# Patient Record
Sex: Female | Born: 1935 | Race: White | Hispanic: No | Marital: Married | Smoking: Never smoker
Health system: Southern US, Community
[De-identification: ages and names within clinical notes are randomized; demographics above are authoritative.]

## PROBLEM LIST (undated history)

## (undated) DIAGNOSIS — F419 Anxiety disorder, unspecified: Secondary | ICD-10-CM

## (undated) DIAGNOSIS — N189 Chronic kidney disease, unspecified: Secondary | ICD-10-CM

## (undated) DIAGNOSIS — T386X5A Adverse effect of antigonadotrophins, antiestrogens, antiandrogens, not elsewhere classified, initial encounter: Secondary | ICD-10-CM

## (undated) DIAGNOSIS — G894 Chronic pain syndrome: Secondary | ICD-10-CM

## (undated) DIAGNOSIS — E039 Hypothyroidism, unspecified: Secondary | ICD-10-CM

## (undated) DIAGNOSIS — I10 Essential (primary) hypertension: Secondary | ICD-10-CM

## (undated) DIAGNOSIS — E7800 Pure hypercholesterolemia, unspecified: Secondary | ICD-10-CM

## (undated) DIAGNOSIS — J101 Influenza due to other identified influenza virus with other respiratory manifestations: Secondary | ICD-10-CM

## (undated) DIAGNOSIS — I4891 Unspecified atrial fibrillation: Secondary | ICD-10-CM

## (undated) DIAGNOSIS — I2699 Other pulmonary embolism without acute cor pulmonale: Secondary | ICD-10-CM

## (undated) DIAGNOSIS — M818 Other osteoporosis without current pathological fracture: Secondary | ICD-10-CM

## (undated) DIAGNOSIS — M1990 Unspecified osteoarthritis, unspecified site: Secondary | ICD-10-CM

## (undated) DIAGNOSIS — IMO0002 Reserved for concepts with insufficient information to code with codable children: Secondary | ICD-10-CM

## (undated) DIAGNOSIS — I2510 Atherosclerotic heart disease of native coronary artery without angina pectoris: Secondary | ICD-10-CM

## (undated) DIAGNOSIS — I499 Cardiac arrhythmia, unspecified: Secondary | ICD-10-CM

## (undated) DIAGNOSIS — J449 Chronic obstructive pulmonary disease, unspecified: Secondary | ICD-10-CM

## (undated) HISTORY — PX: PACEMAKER INSERTION: SHX728

## (undated) HISTORY — DX: Cardiac arrhythmia, unspecified: I49.9

## (undated) HISTORY — PX: APPENDECTOMY: SHX54

## (undated) HISTORY — PX: TOTAL HIP ARTHROPLASTY: SHX124

## (undated) HISTORY — PX: ABDOMINAL HYSTERECTOMY: SHX81

---

## 1999-11-07 ENCOUNTER — Encounter: Admission: RE | Admit: 1999-11-07 | Discharge: 1999-11-07 | Payer: Self-pay | Admitting: *Deleted

## 1999-11-07 ENCOUNTER — Encounter: Payer: Self-pay | Admitting: *Deleted

## 2000-11-08 ENCOUNTER — Encounter: Admission: RE | Admit: 2000-11-08 | Discharge: 2000-11-08 | Payer: Self-pay | Admitting: *Deleted

## 2000-11-08 ENCOUNTER — Encounter: Payer: Self-pay | Admitting: *Deleted

## 2001-06-24 ENCOUNTER — Emergency Department (HOSPITAL_COMMUNITY): Admission: EM | Admit: 2001-06-24 | Discharge: 2001-06-24 | Payer: Self-pay | Admitting: Emergency Medicine

## 2001-07-15 ENCOUNTER — Ambulatory Visit (HOSPITAL_COMMUNITY): Admission: RE | Admit: 2001-07-15 | Discharge: 2001-07-16 | Payer: Self-pay | Admitting: Family Medicine

## 2001-07-16 ENCOUNTER — Encounter: Payer: Self-pay | Admitting: Family Medicine

## 2001-11-03 ENCOUNTER — Other Ambulatory Visit: Admission: RE | Admit: 2001-11-03 | Discharge: 2001-11-03 | Payer: Self-pay | Admitting: Dermatology

## 2001-11-18 ENCOUNTER — Encounter: Admission: RE | Admit: 2001-11-18 | Discharge: 2001-11-18 | Payer: Self-pay | Admitting: *Deleted

## 2001-11-18 ENCOUNTER — Encounter: Payer: Self-pay | Admitting: *Deleted

## 2002-02-02 ENCOUNTER — Inpatient Hospital Stay (HOSPITAL_COMMUNITY): Admission: EM | Admit: 2002-02-02 | Discharge: 2002-02-08 | Payer: Self-pay | Admitting: Emergency Medicine

## 2002-02-04 ENCOUNTER — Encounter (INDEPENDENT_AMBULATORY_CARE_PROVIDER_SITE_OTHER): Payer: Self-pay | Admitting: Cardiology

## 2002-03-18 ENCOUNTER — Encounter: Payer: Self-pay | Admitting: Emergency Medicine

## 2002-03-18 ENCOUNTER — Inpatient Hospital Stay (HOSPITAL_COMMUNITY): Admission: EM | Admit: 2002-03-18 | Discharge: 2002-03-21 | Payer: Self-pay | Admitting: Emergency Medicine

## 2002-05-07 ENCOUNTER — Ambulatory Visit (HOSPITAL_COMMUNITY): Admission: RE | Admit: 2002-05-07 | Discharge: 2002-05-07 | Payer: Self-pay | Admitting: Internal Medicine

## 2002-05-07 ENCOUNTER — Encounter (INDEPENDENT_AMBULATORY_CARE_PROVIDER_SITE_OTHER): Payer: Self-pay | Admitting: Internal Medicine

## 2002-11-20 ENCOUNTER — Encounter: Payer: Self-pay | Admitting: *Deleted

## 2002-11-20 ENCOUNTER — Ambulatory Visit (HOSPITAL_COMMUNITY): Admission: RE | Admit: 2002-11-20 | Discharge: 2002-11-20 | Payer: Self-pay | Admitting: *Deleted

## 2003-10-18 ENCOUNTER — Ambulatory Visit (HOSPITAL_COMMUNITY): Admission: RE | Admit: 2003-10-18 | Discharge: 2003-10-18 | Payer: Self-pay | Admitting: Urology

## 2003-11-23 ENCOUNTER — Ambulatory Visit (HOSPITAL_COMMUNITY): Admission: RE | Admit: 2003-11-23 | Discharge: 2003-11-23 | Payer: Self-pay | Admitting: *Deleted

## 2003-12-08 ENCOUNTER — Ambulatory Visit (HOSPITAL_COMMUNITY): Admission: RE | Admit: 2003-12-08 | Discharge: 2003-12-08 | Payer: Self-pay | Admitting: *Deleted

## 2004-04-26 ENCOUNTER — Inpatient Hospital Stay (HOSPITAL_COMMUNITY): Admission: EM | Admit: 2004-04-26 | Discharge: 2004-05-02 | Payer: Self-pay | Admitting: Orthopedic Surgery

## 2004-04-26 ENCOUNTER — Encounter: Payer: Self-pay | Admitting: Emergency Medicine

## 2004-05-02 ENCOUNTER — Inpatient Hospital Stay (HOSPITAL_COMMUNITY)
Admission: RE | Admit: 2004-05-02 | Discharge: 2004-05-09 | Payer: Self-pay | Admitting: Physical Medicine & Rehabilitation

## 2004-05-02 ENCOUNTER — Ambulatory Visit: Payer: Self-pay | Admitting: Physical Medicine & Rehabilitation

## 2004-10-25 ENCOUNTER — Ambulatory Visit (HOSPITAL_COMMUNITY): Admission: RE | Admit: 2004-10-25 | Discharge: 2004-10-25 | Payer: Self-pay | Admitting: Family Medicine

## 2004-11-16 ENCOUNTER — Inpatient Hospital Stay (HOSPITAL_COMMUNITY): Admission: RE | Admit: 2004-11-16 | Discharge: 2004-11-18 | Payer: Self-pay | Admitting: *Deleted

## 2005-02-02 ENCOUNTER — Ambulatory Visit (HOSPITAL_COMMUNITY): Admission: RE | Admit: 2005-02-02 | Discharge: 2005-02-02 | Payer: Self-pay | Admitting: Family Medicine

## 2005-02-13 ENCOUNTER — Ambulatory Visit (HOSPITAL_COMMUNITY): Admission: RE | Admit: 2005-02-13 | Discharge: 2005-02-13 | Payer: Self-pay | Admitting: Orthopedic Surgery

## 2005-02-28 ENCOUNTER — Encounter: Admission: RE | Admit: 2005-02-28 | Discharge: 2005-02-28 | Payer: Self-pay | Admitting: Orthopedic Surgery

## 2005-03-01 ENCOUNTER — Encounter: Admission: RE | Admit: 2005-03-01 | Discharge: 2005-03-01 | Payer: Self-pay | Admitting: Orthopedic Surgery

## 2005-03-24 ENCOUNTER — Emergency Department (HOSPITAL_COMMUNITY): Admission: EM | Admit: 2005-03-24 | Discharge: 2005-03-24 | Payer: Self-pay | Admitting: Emergency Medicine

## 2005-04-08 ENCOUNTER — Inpatient Hospital Stay (HOSPITAL_COMMUNITY): Admission: EM | Admit: 2005-04-08 | Discharge: 2005-04-14 | Payer: Self-pay | Admitting: *Deleted

## 2005-04-09 ENCOUNTER — Ambulatory Visit: Payer: Self-pay | Admitting: Orthopedic Surgery

## 2005-04-17 ENCOUNTER — Encounter (HOSPITAL_COMMUNITY): Admission: RE | Admit: 2005-04-17 | Discharge: 2005-05-17 | Payer: Self-pay | Admitting: Internal Medicine

## 2005-05-03 ENCOUNTER — Ambulatory Visit (HOSPITAL_COMMUNITY): Admission: RE | Admit: 2005-05-03 | Discharge: 2005-05-03 | Payer: Self-pay | Admitting: Internal Medicine

## 2005-05-14 ENCOUNTER — Ambulatory Visit (HOSPITAL_COMMUNITY): Admission: RE | Admit: 2005-05-14 | Discharge: 2005-05-14 | Payer: Self-pay | Admitting: Ophthalmology

## 2005-05-28 ENCOUNTER — Observation Stay (HOSPITAL_COMMUNITY): Admission: AD | Admit: 2005-05-28 | Discharge: 2005-05-31 | Payer: Self-pay | Admitting: Family Medicine

## 2005-05-29 ENCOUNTER — Ambulatory Visit: Payer: Self-pay | Admitting: Internal Medicine

## 2006-03-28 ENCOUNTER — Ambulatory Visit (HOSPITAL_COMMUNITY): Admission: RE | Admit: 2006-03-28 | Discharge: 2006-03-28 | Payer: Self-pay | Admitting: Family Medicine

## 2006-04-24 ENCOUNTER — Ambulatory Visit (HOSPITAL_COMMUNITY): Admission: RE | Admit: 2006-04-24 | Discharge: 2006-04-24 | Payer: Self-pay | Admitting: Family Medicine

## 2006-06-11 ENCOUNTER — Ambulatory Visit (HOSPITAL_COMMUNITY): Admission: RE | Admit: 2006-06-11 | Discharge: 2006-06-11 | Payer: Self-pay | Admitting: Family Medicine

## 2006-06-13 ENCOUNTER — Emergency Department (HOSPITAL_COMMUNITY): Admission: EM | Admit: 2006-06-13 | Discharge: 2006-06-13 | Payer: Self-pay | Admitting: Emergency Medicine

## 2006-06-27 ENCOUNTER — Ambulatory Visit: Payer: Self-pay | Admitting: Orthopedic Surgery

## 2006-07-01 ENCOUNTER — Ambulatory Visit (HOSPITAL_COMMUNITY): Admission: RE | Admit: 2006-07-01 | Discharge: 2006-07-01 | Payer: Self-pay | Admitting: Orthopedic Surgery

## 2006-07-03 ENCOUNTER — Ambulatory Visit: Payer: Self-pay | Admitting: Orthopedic Surgery

## 2006-07-09 ENCOUNTER — Encounter (HOSPITAL_COMMUNITY): Admission: RE | Admit: 2006-07-09 | Discharge: 2006-08-08 | Payer: Self-pay | Admitting: Orthopedic Surgery

## 2006-08-07 ENCOUNTER — Ambulatory Visit: Payer: Self-pay | Admitting: Orthopedic Surgery

## 2006-08-12 ENCOUNTER — Encounter (HOSPITAL_COMMUNITY): Admission: RE | Admit: 2006-08-12 | Discharge: 2006-09-02 | Payer: Self-pay | Admitting: Orthopedic Surgery

## 2006-09-18 ENCOUNTER — Ambulatory Visit: Payer: Self-pay | Admitting: Orthopedic Surgery

## 2006-09-24 ENCOUNTER — Ambulatory Visit (HOSPITAL_COMMUNITY): Admission: RE | Admit: 2006-09-24 | Discharge: 2006-09-24 | Payer: Self-pay | Admitting: Cardiology

## 2006-10-23 ENCOUNTER — Ambulatory Visit (HOSPITAL_COMMUNITY): Admission: RE | Admit: 2006-10-23 | Discharge: 2006-10-23 | Payer: Self-pay | Admitting: Cardiology

## 2007-04-28 ENCOUNTER — Ambulatory Visit (HOSPITAL_COMMUNITY): Admission: RE | Admit: 2007-04-28 | Discharge: 2007-04-28 | Payer: Self-pay | Admitting: Family Medicine

## 2007-08-07 ENCOUNTER — Ambulatory Visit: Payer: Self-pay | Admitting: Orthopedic Surgery

## 2007-08-07 DIAGNOSIS — M5430 Sciatica, unspecified side: Secondary | ICD-10-CM | POA: Insufficient documentation

## 2008-02-12 ENCOUNTER — Telehealth: Payer: Self-pay | Admitting: Orthopedic Surgery

## 2008-03-17 ENCOUNTER — Ambulatory Visit: Payer: Self-pay | Admitting: Orthopedic Surgery

## 2008-03-17 DIAGNOSIS — M47817 Spondylosis without myelopathy or radiculopathy, lumbosacral region: Secondary | ICD-10-CM

## 2008-05-27 ENCOUNTER — Ambulatory Visit (HOSPITAL_COMMUNITY): Admission: RE | Admit: 2008-05-27 | Discharge: 2008-05-27 | Payer: Self-pay | Admitting: Family Medicine

## 2008-06-21 ENCOUNTER — Ambulatory Visit: Payer: Self-pay | Admitting: Orthopedic Surgery

## 2008-06-24 ENCOUNTER — Telehealth: Payer: Self-pay | Admitting: Orthopedic Surgery

## 2008-06-29 ENCOUNTER — Encounter: Admission: RE | Admit: 2008-06-29 | Discharge: 2008-06-29 | Payer: Self-pay | Admitting: Orthopedic Surgery

## 2008-06-30 ENCOUNTER — Ambulatory Visit: Payer: Self-pay | Admitting: Orthopedic Surgery

## 2008-07-12 ENCOUNTER — Telehealth: Payer: Self-pay | Admitting: Orthopedic Surgery

## 2008-07-12 ENCOUNTER — Encounter: Payer: Self-pay | Admitting: Orthopedic Surgery

## 2008-07-12 LAB — CONVERTED CEMR LAB: aPTT: 32 s (ref 24–37)

## 2008-07-14 ENCOUNTER — Encounter: Payer: Self-pay | Admitting: Orthopedic Surgery

## 2008-07-14 LAB — CONVERTED CEMR LAB: INR: 1.3 (ref 0.0–1.5)

## 2008-07-15 ENCOUNTER — Encounter: Admission: RE | Admit: 2008-07-15 | Discharge: 2008-07-15 | Payer: Self-pay | Admitting: Orthopedic Surgery

## 2008-08-09 ENCOUNTER — Encounter: Payer: Self-pay | Admitting: Orthopedic Surgery

## 2008-08-09 LAB — CONVERTED CEMR LAB
INR: 1.5 (ref 0.0–1.5)
Prothrombin Time: 18.8 s — ABNORMAL HIGH (ref 11.6–15.2)

## 2008-08-10 ENCOUNTER — Encounter: Admission: RE | Admit: 2008-08-10 | Discharge: 2008-08-10 | Payer: Self-pay | Admitting: Orthopedic Surgery

## 2008-09-06 ENCOUNTER — Ambulatory Visit: Payer: Self-pay | Admitting: Orthopedic Surgery

## 2008-09-06 DIAGNOSIS — G894 Chronic pain syndrome: Secondary | ICD-10-CM | POA: Insufficient documentation

## 2008-09-06 HISTORY — DX: Chronic pain syndrome: G89.4

## 2008-10-07 ENCOUNTER — Emergency Department (HOSPITAL_COMMUNITY): Admission: EM | Admit: 2008-10-07 | Discharge: 2008-10-07 | Payer: Self-pay | Admitting: Emergency Medicine

## 2009-04-27 ENCOUNTER — Ambulatory Visit: Payer: Self-pay | Admitting: Orthopedic Surgery

## 2009-05-18 ENCOUNTER — Encounter: Payer: Self-pay | Admitting: Orthopedic Surgery

## 2009-05-30 ENCOUNTER — Ambulatory Visit (HOSPITAL_COMMUNITY): Admission: RE | Admit: 2009-05-30 | Discharge: 2009-05-30 | Payer: Self-pay | Admitting: Family Medicine

## 2009-11-07 ENCOUNTER — Ambulatory Visit: Payer: Self-pay | Admitting: Orthopedic Surgery

## 2010-02-08 ENCOUNTER — Ambulatory Visit: Payer: Self-pay | Admitting: Orthopedic Surgery

## 2010-05-11 ENCOUNTER — Ambulatory Visit: Payer: Self-pay | Admitting: Orthopedic Surgery

## 2010-06-01 ENCOUNTER — Ambulatory Visit (HOSPITAL_COMMUNITY): Admission: RE | Admit: 2010-06-01 | Discharge: 2010-06-01 | Payer: Self-pay | Admitting: Family Medicine

## 2010-09-25 ENCOUNTER — Encounter: Payer: Self-pay | Admitting: Family Medicine

## 2010-10-03 NOTE — Assessment & Plan Note (Signed)
Summary: 6 MO RECK ?XR BACK/MEDICARE/UHC/BSF   Visit Type:  Follow-up  CC:  back pain.  History of Present Illness: I saw [name] in the office today for a 6 month followup visit.  She is a 77 years old woman with the complaint of:  back pain.  Medications: Vicodin, helps. Lidoderm patch.  Pain medication is working well some days has to take every 4 hours   walker somedays but usually uses a cane  ROS bowels and bladder function is normal   she is a little today. She had a bad weekend, but her muscle function in her lower legs is doing very well.  Plan:  refill meds   f/u in 3 months        Allergies: No Known Drug Allergies   Impression & Recommendations:  Problem # 1:  CHRONIC PAIN SYNDROME (ICD-338.4) Assessment Improved  Problem # 2:  ARTHRITIS, LUMBOSACRAL SPINE (ICD-721.3) Assessment: Improved  Patient Instructions: 1)  Please schedule a follow-up appointment in 3 months. Prescriptions: VICODIN 5-500 MG  TABS (HYDROCODONE-ACETAMINOPHEN) 1 by mouth q 4 as needed  #90 x 5   Entered and Authorized by:   Fuller Canada MD   Signed by:   Fuller Canada MD on 11/07/2009   Method used:   Print then Give to Patient   RxID:   [id number] LIDODERM 5 % PTCH (LIDOCAINE) apply as directed  #1 box x 5   Entered and Authorized by:   Fuller Canada MD   Signed by:   Fuller Canada MD on 11/07/2009   Method used:   Print then Give to Patient   RxID:   [id number]

## 2010-10-03 NOTE — Assessment & Plan Note (Signed)
Summary: 3 M RE-CK BACK/RESP TO MED/MEDICARE,UHC/CAF   Visit Type:  Follow-up  CC:  recheck back.  History of Present Illness: I saw [name] in the office today for a 6 month followup visit.  She is a 77 years old woman with the complaint of: history of chronic back pain currently following medications seem to be controlling her pain  Medications: Vicodin 5 takes 1 q 4 hrs, uses Lidoderm patch too, Gabapentin 100mg  three times a day added last visit. she can only take the gabapentin 100 mg twice a day because more than that makes her dizzy  She needs a pain medicine refill.  She is in stable condition  She does complain of occasional leg weakness.  But she can manage well with a walker.     Allergies: No Known Drug Allergies   Impression & Recommendations:  Problem # 1:  CHRONIC PAIN SYNDROME (ICD-338.4) Assessment Unchanged  Orders: Est. Patient Level II (91478)  Problem # 2:  ARTHRITIS, LUMBOSACRAL SPINE (ICD-721.3) Assessment: Unchanged  Orders: Est. Patient Level II (29562)  Patient Instructions: 1)  as needed Prescriptions: VICODIN 5-500 MG  TABS (HYDROCODONE-ACETAMINOPHEN) 1 by mouth q 4 as needed  #90 x 5   Entered and Authorized by:   Fuller Canada MD   Signed by:   Fuller Canada MD on 05/11/2010   Method used:   Print then Give to Patient   RxID:   [id number]

## 2010-10-03 NOTE — Assessment & Plan Note (Signed)
Summary: 3 M RE-CK BACK/RESP TO MED/MEDICARE,UHC/CAF   Visit Type:  Follow-up  CC:  recheck back pain.  History of Present Illness: I saw Alfred Heidt in the office today for a 6 month followup visit.  She is a 77 years old woman with the complaint of:  back pain.  Medications: Vicodin 5 takes 1 q 4 hrs, wants to take them more than every 4 hrs, uses Lidoderm patch too.  Pain medication is working well some days has to take every 4 hours   walker somedays but usually uses a cane  ROS bowels and bladder function is normal   she is a little today. She had a bad weekend, but her muscle function in her lower legs is doing very well.  Today is a 3 month recheck on back.  I will at this time but an occasional burning sensation the RIGHT gluteal region.  Recommend some Neurontin 100 mg t.i.d. to go with her Vicodin.  She is also on the Lidoderm patches as stated.        Allergies: No Known Drug Allergies   Impression & Recommendations:  Problem # 1:  CHRONIC PAIN SYNDROME (ICD-338.4) Assessment Unchanged  Orders: Est. Patient Level II (16109)  Problem # 2:  ARTHRITIS, LUMBOSACRAL SPINE (ICD-721.3) Assessment: Unchanged  Orders: Est. Patient Level II (60454)  Problem # 3:  SCIATICA (ICD-724.3) Assessment: Improved  Her updated medication list for this problem includes:    Vicodin 5-500 Mg Tabs (Hydrocodone-acetaminophen) .Marland Kitchen... 1 by mouth q 4 as needed  Orders: Est. Patient Level II (09811)  Patient Instructions: 1)  Please schedule a follow-up appointment in 3 months. Prescriptions: GABAPENTIN 100 MG CAPS (GABAPENTIN) 1 by mouth three times a day  #90 x 3   Entered and Authorized by:   Fuller Canada MD   Signed by:   Fuller Canada MD on 02/08/2010   Method used:   Print then Give to Patient   RxID:   [id number] GABAPENTIN 100 MG CAPS (GABAPENTIN) 1 by mouth three times a day  #90 x 3   Entered and Authorized by:   Fuller Canada MD   Signed  by:   Fuller Canada MD on 02/08/2010   Method used:   Print then Give to Patient   RxID:   [id number]

## 2010-11-27 ENCOUNTER — Other Ambulatory Visit: Payer: Self-pay | Admitting: *Deleted

## 2010-11-27 DIAGNOSIS — R52 Pain, unspecified: Secondary | ICD-10-CM

## 2010-11-27 MED ORDER — LIDOCAINE 5 % EX PTCH: 1.0000 | MEDICATED_PATCH | CUTANEOUS | Status: AC

## 2010-12-15 ENCOUNTER — Emergency Department (HOSPITAL_COMMUNITY)
Admission: EM | Admit: 2010-12-15 | Discharge: 2010-12-16 | Disposition: A | Discharge status: Home / Self Care | Payer: Medicare Other | Attending: Emergency Medicine | Admitting: Emergency Medicine

## 2010-12-15 DIAGNOSIS — R300 Dysuria: Secondary | ICD-10-CM | POA: Insufficient documentation

## 2010-12-15 DIAGNOSIS — I10 Essential (primary) hypertension: Secondary | ICD-10-CM | POA: Insufficient documentation

## 2010-12-15 DIAGNOSIS — R3915 Urgency of urination: Secondary | ICD-10-CM | POA: Insufficient documentation

## 2010-12-15 DIAGNOSIS — Z79899 Other long term (current) drug therapy: Secondary | ICD-10-CM | POA: Insufficient documentation

## 2010-12-15 DIAGNOSIS — R109 Unspecified abdominal pain: Secondary | ICD-10-CM | POA: Insufficient documentation

## 2010-12-15 DIAGNOSIS — Z7901 Long term (current) use of anticoagulants: Secondary | ICD-10-CM | POA: Insufficient documentation

## 2010-12-15 DIAGNOSIS — N390 Urinary tract infection, site not specified: Secondary | ICD-10-CM | POA: Insufficient documentation

## 2010-12-15 DIAGNOSIS — Z950 Presence of cardiac pacemaker: Secondary | ICD-10-CM | POA: Insufficient documentation

## 2010-12-15 DIAGNOSIS — Z9889 Other specified postprocedural states: Secondary | ICD-10-CM | POA: Insufficient documentation

## 2010-12-15 DIAGNOSIS — E039 Hypothyroidism, unspecified: Secondary | ICD-10-CM | POA: Insufficient documentation

## 2010-12-15 DIAGNOSIS — R350 Frequency of micturition: Secondary | ICD-10-CM | POA: Insufficient documentation

## 2010-12-15 LAB — URINALYSIS, ROUTINE W REFLEX MICROSCOPIC
Glucose, UA: NEGATIVE mg/dL
Urobilinogen, UA: 0.2 mg/dL (ref 0.0–1.0)

## 2010-12-15 LAB — CBC
Hemoglobin: 15 g/dL (ref 12.0–15.0)
MCHC: 33.5 g/dL (ref 30.0–36.0)
MCV: 90 fL (ref 78.0–100.0)
Platelets: 247 10*3/uL (ref 150–400)
RBC: 4.98 MIL/uL (ref 3.87–5.11)
WBC: 9.4 10*3/uL (ref 4.0–10.5)

## 2010-12-15 LAB — DIFFERENTIAL
Basophils Absolute: 0 10*3/uL (ref 0.0–0.1)
Eosinophils Relative: 1 % (ref 0–5)
Lymphocytes Relative: 16 % (ref 12–46)
Monocytes Absolute: 1.2 10*3/uL — ABNORMAL HIGH (ref 0.1–1.0)
Neutro Abs: 6.7 10*3/uL (ref 1.7–7.7)
Neutrophils Relative %: 71 % (ref 43–77)

## 2010-12-15 LAB — PROTIME-INR
INR: 3.81 — ABNORMAL HIGH (ref 0.00–1.49)
Prothrombin Time: 37.5 seconds — ABNORMAL HIGH (ref 11.6–15.2)

## 2010-12-15 LAB — BASIC METABOLIC PANEL
Creatinine, Ser: 0.97 mg/dL (ref 0.4–1.2)
Sodium: 135 mEq/L (ref 135–145)

## 2010-12-15 LAB — URINE MICROSCOPIC-ADD ON

## 2010-12-17 LAB — URINE CULTURE
Colony Count: NO GROWTH
Culture  Setup Time: 201204141945
Culture: NO GROWTH

## 2010-12-19 ENCOUNTER — Other Ambulatory Visit (HOSPITAL_COMMUNITY): Payer: Self-pay | Admitting: Cardiology

## 2010-12-19 ENCOUNTER — Ambulatory Visit (HOSPITAL_COMMUNITY)
Admission: RE | Admit: 2010-12-19 | Discharge: 2010-12-19 | Disposition: A | Discharge status: Home / Self Care | Payer: Medicare Other | Source: Ambulatory Visit | Attending: Cardiology | Admitting: Cardiology

## 2010-12-19 ENCOUNTER — Encounter (HOSPITAL_COMMUNITY): Payer: Self-pay

## 2010-12-19 DIAGNOSIS — R0602 Shortness of breath: Secondary | ICD-10-CM | POA: Insufficient documentation

## 2010-12-19 DIAGNOSIS — Z01810 Encounter for preprocedural cardiovascular examination: Secondary | ICD-10-CM

## 2010-12-19 DIAGNOSIS — Z01818 Encounter for other preprocedural examination: Secondary | ICD-10-CM | POA: Insufficient documentation

## 2010-12-19 HISTORY — DX: Essential (primary) hypertension: I10

## 2010-12-19 LAB — PROTIME-INR
INR: 2.6 — ABNORMAL HIGH (ref 0.00–1.49)
Prothrombin Time: 29.3 seconds — ABNORMAL HIGH (ref 11.6–15.2)

## 2010-12-25 ENCOUNTER — Ambulatory Visit (HOSPITAL_COMMUNITY)
Admission: RE | Admit: 2010-12-25 | Discharge: 2010-12-25 | Disposition: A | Discharge status: Home / Self Care | Payer: Medicare Other | Source: Ambulatory Visit | Attending: Cardiology | Admitting: Cardiology

## 2010-12-25 DIAGNOSIS — Z950 Presence of cardiac pacemaker: Secondary | ICD-10-CM | POA: Insufficient documentation

## 2010-12-25 DIAGNOSIS — K5730 Diverticulosis of large intestine without perforation or abscess without bleeding: Secondary | ICD-10-CM | POA: Insufficient documentation

## 2010-12-25 DIAGNOSIS — I4891 Unspecified atrial fibrillation: Secondary | ICD-10-CM | POA: Insufficient documentation

## 2010-12-25 DIAGNOSIS — I4892 Unspecified atrial flutter: Secondary | ICD-10-CM | POA: Insufficient documentation

## 2010-12-25 DIAGNOSIS — M1990 Unspecified osteoarthritis, unspecified site: Secondary | ICD-10-CM | POA: Insufficient documentation

## 2011-01-06 NOTE — Op Note (Signed)
  [name], [name]               ACCOUNT NO.:  [id number]  MEDICAL RECORD NO.:  [id number]           PATIENT TYPE:  O  LOCATION:  [facility]                         FACILITY:  [facility]  PHYSICIAN:  Ritta Slot, MD     DATE OF BIRTH:  February 22, 1927  DATE OF PROCEDURE:  12/25/2010 DATE OF DISCHARGE:                              OPERATIVE REPORT   This is a DC cardioversion.  INDICATIONS:  [name] is a very pleasant 77 year old lady who had intermittent paroxysmal atrial fibrillation/flutter.  She has done very well up until recently, where she has been in atrial flutter for the past 4-5 days.  She feels very tired and lethargic.  We did load her with amiodarone 200 mg in the office, but unfortunately this has not converted her.  We also tried ATP pacing her, but this has not worked. She was therefore brought to [facility] suite.  We performed TEE demonstrating no significant left atrial clot but she did have mild smoke but pulse wave velocity was greater than 40 cm/sec.  Therefore, we proceeded with DC cardioversion.  After informed consent, Dr. Sampson Goon, anesthetist, gave her 50 mg of IV propofol.  She received one time of 150-joule biphasic shock, that restored normal sinus rhythm straightaway.  She has done very well and she is maintaining a pacing descending now.  She will go home later today.  She will follow up with me in the office in 1 week.     Ritta Slot, MD     HS/MEDQ  D:  12/25/2010  T:  12/26/2010  Job:  [id number]  Electronically Signed by Ritta Slot MD on 01/06/2011 04:04:04 PM

## 2011-01-10 ENCOUNTER — Other Ambulatory Visit: Payer: Self-pay | Admitting: *Deleted

## 2011-01-10 DIAGNOSIS — R52 Pain, unspecified: Secondary | ICD-10-CM

## 2011-01-10 MED ORDER — HYDROCODONE-ACETAMINOPHEN 5-500 MG PO TABS: 1.0000 | ORAL_TABLET | ORAL | Status: DC | PRN

## 2011-01-19 NOTE — Discharge Summary (Signed)
NAMEADDALYNE, VANDEHEI               ACCOUNT NO.:  [id number]   MEDICAL RECORD NO.:  [id number]          PATIENT TYPE:  OBV   LOCATION:  A211                          FACILITY:  [facility]   PHYSICIAN:  Patrica Duel, M.D.    DATE OF BIRTH:  21-Jun-1927   DATE OF ADMISSION:  05/28/2005  DATE OF DISCHARGE:  09/28/2006LH                                 DISCHARGE SUMMARY   DISCHARGE DIAGNOSES:  1.  Nausea, vomiting and abdominal pain secondary to severe erosive      esophagitis.  2.  Coronary artery disease, status post coronary stenting in 2003.  3.  Tachycardia-bradycardia syndrome, status post pacer placement 2 years      ago.  4.  Long-standing hypertension.  5.  Atrial fibrillation.  6.  Hypothyroidism.  7.  Right renal cyst.  8.  Chronic anticoagulation.  9.  Glaucoma.  10. Diverticulitis.   HISTORY OF PRESENT ILLNESS:  For details regarding admission, please refer  to the admitting note.  Briefly, this 77 year old female with the above  history presented to the office with 2-day history of increasing severe  nausea and upper epigastric abdominal pain.  She was obviously miserable and  presented to the hospital for further evaluation and therapy.   PAST SURGICAL HISTORY:  1.  Appendectomy.  2.  Hysterectomy.   REVIEW OF SYSTEMS:  GASTROINTESTINAL:  There is no history of melena,  hematemesis, hematochezia or genitourinary symptoms.  CARDIOPULMONARY:  She  also denied any chest pain, syncope or shortness of breath.   HOSPITAL COURSE:  The patient was admitted for hydration and routine  screening.  Cardiac markers, etc., remained negative.  Dr. Karilyn Cota was  consulted and performed an upper endoscopy the day of admission.  He found  significant erosive esophagitis.   The patient has been treated with PPIs as well as Carafate.  Her symptoms  have resolved and she is stable for discharge at this time.   DISCHARGE MEDICATIONS:  1.  Norvasc 2.5 mg daily.  2.  Synthroid 75 mcg  daily.  3.  Xalatan and Alphagan eye drops.  4.  Pravachol 20 mg daily.  5.  Coumadin 2.5 mg daily.  6.  Amiodarone 200 mg daily.  7.  Toprol XL 50 mg daily.      Patrica Duel, M.D.  Electronically Signed     MC/MEDQ  D:  05/31/2005  T:  05/31/2005  Job:  [id number]

## 2011-01-19 NOTE — Cardiovascular Report (Signed)
[name], [name]               ACCOUNT NO.:  [id number]   MEDICAL RECORD NO.:  [id number]          PATIENT TYPE:  INP   LOCATION:  3738                         FACILITY:  [facility]   PHYSICIAN:  Danielle Priestly, MD  DATE OF BIRTH:  [birth year]   DATE OF PROCEDURE:  11/17/2004  DATE OF DISCHARGE:                              CARDIAC CATHETERIZATION   PROCEDURES:  1.  Insertion of a Medtronic dual-chamber Enrhythm generator with passive      atrial and ventricular leads.   ATTENDING:  Darlin [name], M.D.   COMPLICATIONS:  None.   INDICATIONS:  [name] is a 77 year old female patient of Dr. Chanda [name] and Dr. Elpidio [name] with a history of sick sinus syndrome,  paroxysmal atrial fibrillation, and history of CAD status post cutting  balloon angioplasty of LAD in 2003 with a normal EF.  She has had several  recent falls with documented heart rates in the 40s.  She is on no negative  chronotropic agents secondary to her persistent bradycardia.  She is now  referred for a dual-chamber pacemaker placement for symptomatic bradycardia,  PAF, and the ability to add negative chronotropic agents as a benefit for  her coronary artery disease.   DESCRIPTION OF OPERATION:  After giving informed consent, patient was  brought to the cardiac catheterization lab where her left anterior chest was  prepped and draped in the sterile fashion.  ECG monitoring was established.  Next, 1% lidocaine was used to anesthetize the left subclavian region in the  midclavicular line.  Next, an approximately 2.5 to 3 cm horizontal incision  was then carried out beneath the left clavicle and hemostasis was obtained  with electrocautery.  Blunt dissection was used to carry this down to the  pectoral fascia.  Next, a 3 x 4 cm pocket was then created over the left  pectoral fascia and, again, hemostasis was obtained with electrocautery.  Following this, a single left subclavian stick was then used to  enter the  left subclavian vein and guide wire was then easily passed into the SVC and  right atrium.  Over this a 9 French dilator and sheath were then easily  passed and the dilator and guide wire were removed.  Next, a passive 52 cm  Medtronic lead, model [id number], serial [id number], was then easily passed into  the SVC and right atrium.  The guide wire was then retained and the sheath  was then peeled away.  A second 9 French dilator and sheath was then easily  passed over the retained guide wire and, again, the guide wire and dilator  were removed.  A second 45 cm Medtronic lead, model #4592, serial #  [id number], was then easily passed into the right atrium.  The guide wire  was then retained and the sheath was then removed.  The guide wire was then  anchored to the sheath with a hemostat.  A J curve was then placed on the  ventricular lead stylet and the ventricular lead was then allowed to  prolapse in the tricuspid valve from position of the RV  apex without  difficulty.  Thresholds were then determined.  R waves were measured at 20.4  mV.  Threshold measure was 0.4 V at 0.5 msec.  Impedance was 583 ohms.  Current was 1.4 mA.  At 10 V, it was negative in both the A and the V.  The  atrial lead was then positioned in the atrial appendage and thresholds were  then determined.  P waves were measured at 2.5 mV.  Threshold in the atrium  was 0.7 V at 0.5 msec.  Impedance was 446 ohms.  Current was 2.1 mA.  Again,  10 V were negative for diaphragmatic stimulation.  Each lead was then  sutured to the pectoral fascia with two silk sutures per lead.  The pocket  was then copiously irrigated with 1% kanamycin solution and, again,  hemostasis was confirmed.  The atrial and ventricular lead were then  connected in a serial fashion to a Medtronic Enrhythm [id number], serial  #[id number].  Head screws were tightened and the pacing was confirmed.  A  single 2-0 silk suture positioned in the apex of the  pocket.  The generator  and leads were then placed into the pocket and the header was then sutured  in place with the silk suture.  Again, hemostasis was confirmed.  The pocket  subcutaneous layer was then closed using a running 2-0 Vicryl.  The skin  layers were then closed using running 4-0 Vicryl.  Steri-Strips were then  applied.  The patient was returned to the recovery room in stable condition.   CONCLUSIONS:  Successful implant of a Medtronic Enrhythm [id number] generator,  serial [id number], with passive atrial and ventricular leads.      RHM/MEDQ  D:  11/17/2004  T:  11/17/2004  Job:  [id number]   cc:   [name], M.D.  [address]  [place]  [place] [address]  Fax: [phone]   [name], M.D.  [phone] [address]  [place]  [place] [address]  Fax: [phone]

## 2011-01-19 NOTE — Op Note (Signed)
NAMEEMILY, [name]               ACCOUNT NO.:  [id number]   MEDICAL RECORD NO.:  [id number]          PATIENT TYPE:  OBV   LOCATION:  A211                          FACILITY:  [facility]   PHYSICIAN:  Lionel December, M.D.    DATE OF BIRTH:  Sep 17, 1926   DATE OF PROCEDURE:  05/29/2005  DATE OF DISCHARGE:                                 OPERATIVE REPORT   PROCEDURE:  Esophagogastroduodenoscopy.   INDICATION:  Regla is a 77 year old Caucasian female admitted with nausea  and epigastric pain. Her LFTs, amylase, and lipase have been normal. She had  an upper abdominal ultrasound this morning which was also unremarkable. Her  bile duct size was 7.1 mm but no evidence of cholelithiasis or  choledocholithiasis. She is undergoing diagnostic EGD. Procedure risks were  reviewed with the patient, and informed consent was obtained.   PREMEDICATION:  Cetacaine spray for pharyngeal topical anesthesia, Demerol  25 mg IV, Versed 3 mg IV.   FINDINGS:  Procedure performed in endoscopy suite. The patient's vital signs  and O2 saturation were monitored during the procedure and remained stable.  The patient was placed in left lateral position, and Olympus videoscope was  passed via oropharynx without any difficulty into esophagus.   Esophagus. Mucosa of the proximal middle third was normal. Distally, she had  two erosions just proximal to GE junction, surrounded by intensely  erythematous mucosa. GE junction was at 36 cm and hiatus was at 40.   Stomach. It was empty and distended very well insufflation. Folds of  proximal stomach were normal. Examination of mucosa at body was normal.  There were linear streaks of erythematous mucosa at antrum but no erosions  or ulcers noted. Pyloric channel was patent. Angularis, fundus and cardia  were examined by retroflexing the scope and were normal.   Duodenum. Bulbar mucosa was normal. Scope was passed in the second part of  duodenum where mucosa and folds were  normal. Endoscope was withdrawn. The  patient tolerated the procedure well.   FINAL DIAGNOSES:  1.  Erosive reflux esophagitis. These appeared to be acute erosions and may      be pill induced.  2.  Small sliding hiatal hernia.  3.  Nonerosive antral gastritis. No evidence of peptic ulcer disease.   RECOMMENDATIONS:  1.  Will change Protonix 40 mg p.o. b.i.d.  2.  H pylori serology.  3.  A 4-g sodium low-fat diet.  4.  The patient will be reevaluated in a.m.      Lionel December, M.D.  Electronically Signed     NR/MEDQ  D:  05/29/2005  T:  05/30/2005  Job:  [id number]

## 2011-01-19 NOTE — Cardiovascular Report (Signed)
[facility]  Patient:    [name], [name] Visit Number: [id number] MRN: [id number]          Service Type: MED Location: [phone] Attending Physician:  Virgina Evener Dictated by:   Lennette Bihari, M.D. Proc. Date: 02/03/02 Admit Date:  02/02/2002   CC:         Jonell Cluck, M.D., [place], [place]  [facility], Office   Cardiac Catheterization  PROCEDURE:  Left heart catheterization, cine coronary angiography, biplane cine left ventriculography, distal aortography, cutting balloon atherotomy of the diagonal vessel with double bolus Integrilin/weight-adjusted heparinization.  INDICATIONS:  The patient is a 77 year old white female who was admitted to [facility] yesterday.  She had developed 6 hours of chest burning pressure on Sunday associated with shortness of breath and diaphoresis.  She did have some recurrent symptoms yesterday leading to her evaluation, initially by Dr. Nobie Putnam.  I saw her as an add-on at which time she did have new inferolateral ST changes and was in atrial fibrillation.  She was admitted from the office to [facility].  CPK enzymes are negative.  She has been heparinized, started on beta blocker therapy, and scheduled for cardiac catheterization.  DESCRIPTION OF PROCEDURE:  After premedication with Valium 5 mg intravenously, the patient was prepped and draped in the usual fashion.  The right femoral artery was punctured anteriorly and a #6 French sheath was inserted. Diagnostic catheterization was done with #6 French Judkins #4 left and right coronary catheters.  A #6 French pigtail catheter was used for biplane cine left ventriculography as well as distal aortography.  With the demonstration of ostial proximal stenosis in a diagonal vessel that had a 90-degree takeoff of the LAD, at this time I broke scrub and reviewed the angiographic findings with the patients family.  It was felt that an  attempt at intervention should be performed with probable cutting balloon atherotomy, especially due to the high degree takeoff.  The arterial sheath was upgraded to a #7 [place] system. Double bolus Integrilin and 4200 units of weight-adjusted heparinization was administered.  The patient also received 300 mg of Plavix.  An extra support wire was advanced down the diagonal vessel via an FL3.5 guide.  A 2.25 x 6-mm cutting balloon atherotomy catheter was used for the intervention.  Numerous cuts were made, both ensuring that the ostium as well as proximal portion were dilated.  Initially there was still some recoil ostially but with the balloon being dilated at the ostium to 6 atmospheres, this improved.  It was felt that the vessel size was approximately 2.25-mm and due to the sharp angle and if stenting were to occur that the stent would impinge somewhat in the lumen of the LAD, with the excellent angiographic result obtained by cutting balloon atherotomy, the decision was made to not stent presently, only if restenosis developed.  Scouty angiography confirmed an excellent angiographic result. The arterial sheath was sutured in place with plans for sheath removal later today.  HEMODYNAMIC DATA: 1. Central aortic pressure 150/82. 2. Left ventricular pressure 150/14.  ANGIOGRAPHIC DATA: 1. The left main coronary artery was normal and bifurcated into the LAD and    left circumflex system.  2. The LAD had 30-40% ostial narrowing that was smooth.  The proximal LAD then    tapered to approximately 10-20% narrowing.  In this region there was a    sharp takeoff diagonal vessel with 85% ostial to proximal stenosis.  The  remainder of the LAD was free of significant disease and extended to the    LV apex.  3. The circumflex was a moderate sized vessel that had two large marginal    vessels.  It was angiographically normal.  4. The right coronary artery was somewhat tortuous, large  caliber, but    angiographically normal.  BIPLANE CINE LEFT VENTRICULOGRAPHY:  Biplane cine left ventriculography revealed normal LV function.  There was 2+ angiographic mitral regurgitation.  DISTAL AORTOGRAPHY:  Distal aortography did show 20% smooth narrowing in the right renal artery.  Following cutting balloon atherotomy with double bolus Integrilin/ weight-adjusted heparinization utilizing a 2.25 x 6-mm cutting balloon and an extra support wire, the 85% stenosis was reduced to less than 10%.  There was TIMI-3 flow.  There was no evidence for dissection.  With the excellent angiographic result as noted above, the decision was made not to stent this ostium since due to the angle, this would result in some stent encroaching upon the lumen of the LAD.  IMPRESSION: 1. Normal left ventricular function with 2+ angiographic mitral regurgitation. 2. Single-vessel coronary artery disease involving the left anterior    descending artery diagonal system with 30-40% ostial smooth left anterior    descending artery stenosis, 10-20% left anterior descending artery stenosis    in the region of the first diagonal vessel with 85% stenosis of the ostium    of the diagonal vessel. 3. Successful cutting balloon atherotomy of the diagonal vessel with the 85%    stenosis being reduced to less than 10% and without evidence for dissection    and with continued TIMI-3 flow (done with double bolus Integrilin/    weight-adjusted heparinization). Dictated by:   Lennette Bihari, M.D. Attending Physician:  Virgina Evener DD:  02/03/02 TD:  02/04/02 Job: [id number]

## 2011-01-19 NOTE — H&P (Signed)
[name], [name]               ACCOUNT NO.:  [id number]   MEDICAL RECORD NO.:  [id number]          PATIENT TYPE:  INP   LOCATION:  A211                          FACILITY:  [facility]   PHYSICIAN:  Patrica Duel, M.D.    DATE OF BIRTH:  1927/04/20   DATE OF ADMISSION:  05/28/2005  DATE OF DISCHARGE:  LH                                HISTORY & PHYSICAL   CHIEF COMPLAINT:  Nausea.   HISTORY OF PRESENT ILLNESS:  This is a 77 year old female with relatively  complicated history.  She has coronary artery disease and is status post  coronary stenting in June 2003.  She also has tachycardia-bradycardia  syndrome and underwent pacemaker placement within the past 2 years.  She  also has longstanding hypertension, atrial fibrillation, hypothyroidism, and  a right renal cyst.  She is on chronic Coumadin therapy.  She also has  glaucoma which has been well controlled by the ophthalmologist.   The patient presented in the office today with a 48-hour history of  increasingly severe nausea and epigastric abdominal pain.  She has had no  melena, hematemesis, hematochezia, genitourinary symptoms.  She also denies  any chest pain, syncope, shortness of breath, or other problems.   In the office, it was apparent the patient was in moderate distress,  somewhat dehydrated, and I feel hospitalization would be appropriate at this  time for IV hydration, further evaluation, and therapy as indicated.   There is no history of any neurologic deficits, recent weight loss, or GI  bleeding.   CURRENT MEDICATIONS:  1.  Norvasc 2.5 daily.  2.  Synthroid 75 mcg daily.  3.  Xalatan and Alphagan eye drops as well as Cosopt eye drops.  4.  Pravachol 20 daily.  5.  Coumadin 2.5 mg daily.  6.  Amiodarone 200 mg daily.  7.  Toprol XL 50 mg daily.   ALLERGIES:  None known.   PAST MEDICAL HISTORY:  As reviewed above.   PAST SURGICAL HISTORY:  1.  Appendectomy.  2.  Partial hysterectomy.  3.  Diverticulitis.  4.  She had her gallbladder checked approximately a year ago, and this      apparently was benign.   FAMILY HISTORY:  Noncontributory.   REVIEW OF SYSTEMS:  Negative except as mentioned.   PHYSICAL EXAMINATION:  GENERAL:  This is an elderly female who is in  moderate distress.  She is alert and oriented.  VITAL SIGNS: Temperature 98.2, heart rate 70 and regular, respirations 16  and unlabored, blood pressure 138/82.  HEENT:  Normocephalic and atraumatic.  Pupils are equal.  Ears, nose, throat  benign.  NECK:  Supple without bruits noted.  HEART:  Sounds are essentially normal.  No murmurs, rubs, or gallops.  LUNGS: Clear.  ABDOMEN:  Mild epigastric tenderness without guarding or rebound.  Right  upper quadrant is clear.  Bowel sounds are intact.  EXTREMITIES:  No clubbing, cyanosis, or edema.  Peripheral pulses are  marginal.   ASSESSMENT:  Apparent gastritis, though other possibilities certainly need  to be ruled out.   PLAN:  1.  Admit for hydration.  2.  Lab review.  3.  Symptomatic therapy.  4.  Will follow and treat expectantly.      Patrica Duel, M.D.  Electronically Signed     MC/MEDQ  D:  05/28/2005  T:  05/28/2005  Job:  [id number]

## 2011-01-19 NOTE — Discharge Summary (Signed)
[name], [name]                         ACCOUNT NO.:  [id number]   MEDICAL RECORD NO.:  [id number]                   PATIENT TYPE:  IPS   LOCATION:  4012                                 FACILITY:  [facility]   PHYSICIAN:  Ranelle Oyster, M.D.             DATE OF BIRTH:  1927-01-02   DATE OF ADMISSION:  05/02/2004  DATE OF DISCHARGE:  05/09/2004                                 DISCHARGE SUMMARY   DISCHARGE DIAGNOSES:  1.  Right femoral neck fracture status post bipolar hemiarthroplasty April 28, 2004.  2.  Pain management.  3.  Paroxysmal atrial fibrillation with chronic Coumadin therapy.  4.  Hypertension.  5.  Hypothyroidism.  6.  Coronary artery disease.  7.  Hyperlipidemia.  8.  Diverticulosis.   HISTORY OF PRESENT ILLNESS:  [name].  History of PAF  with chronic Coumadin who was admitted August 24 after a fall while at  [employer] without loss of consciousness sustaining a displaced  right femoral neck fracture.  INR on admission of 3.6.  Placed in [place]  traction with follow-up cardiology and allow INR to drift downward to 1.4 on  August 26 and underwent a right hip hemiarthroplasty per Dr. Lequita Halt.  Coumadin resumed and monitored.  Cardiac status remained stable.  Cross  coverage with subcutaneous Lovenox until INR greater than 2.  Admitted for a  comprehensive rehabilitation program.   PAST MEDICAL HISTORY:  See discharge diagnoses.   ALLERGIES:  CONTRAST MEDIA.   HABITS:  No alcohol or tobacco.   MEDICATIONS ON ADMISSION:  1.  Coumadin.  2.  Norvasc.  3.  Synthroid.  4.  Pravachol.  5.  Eye drops.   Doses were not made available.   SOCIAL HISTORY:  Married.  Husband cannot provide physical assistance.  One-  level home.  Local son, check as needed.  Patient independent prior to  admission.   HOSPITAL COURSE:  Patient with progressive gains while in rehabilitation  services with therapies initiated on a b.i.d. basis.   The following issues  were followed during patient's rehabilitation course.  Pertaining to [name]'s right femoral neck fracture with bipolar hemiarthroplasty, surgical  site healing nicely.  Steri-Strips were in place.  Hip precautions were  initiated.  She was independent in her room.  Pain management ongoing with  the use of oxycodone and good results.  She was maintained on her chronic  Coumadin for a history of PAF with cardiac status stable.  Latest INR of  2.5.  She was followed by Dr. Elsie Lincoln [phone]) with a home health nurse  arranged per [facility] to continue Coumadin therapy.  Her  blood pressures remained controlled on Norvasc and amiodarone.  She had no  headache or dizziness.  She remained on hormone supplement for  hypothyroidism.  She had no chest pain or shortness of breath throughout her  rehabilitation course.  She did have some mild constipation that was  resolved with laxative assistance.  Overall, for her functional mobility she  was ambulating extended distances with a rolling walker, 160 feet with  supervision.  Strength and endurance greatly improved.  She was encouraged  with her overall progress and plans to be discharged home with home health  therapies.   Latest laboratories showed an INR of 2.5, hemoglobin 10.5, hematocrit 30.7.  Sodium 131, potassium 4.0, BUN 9, creatinine 0.7.   DISCHARGE MEDICATIONS:  1.  Coumadin 5 mg (doses adjusted accordingly).  2.  Amiodarone 200 mg daily.  3.  Norvasc 2.5 mg daily.  4.  Synthroid 75 mcg daily.  5.  Zocor 20 mg daily.  6.  Eye drops as directed as prior to hospital admission.  7.  Oxycodone as needed pain.   ACTIVITY:  As tolerated with hip precautions.   DIET:  Regular.   SPECIAL INSTRUCTIONS:  Home health nurse per [facility] to  check Coumadin therapy Thursday, September 8.  Results to Dr. Elsie Lincoln [phone], fax [phone]).  Home health physical and occupational  therapy.  She  should follow up with Dr. Ollen Gross, orthopedic services, call for  appointment.      [name], P.A.                     Ranelle Oyster, M.D.    DA/MEDQ  D:  05/09/2004  T:  05/09/2004  Job:  [id number]   cc:   Ollen Gross, M.D.  [facility]  [address]  [address]  [place]  [place] [address]  Fax: [phone]   Madaline Savage, M.D.  [phone] [address]  [place]  [place] [address]  Fax: [phone]   Patrica Duel, M.D.  [address], [address]  [place]  [place] [address]  Fax: [phone]

## 2011-01-19 NOTE — Discharge Summary (Signed)
[place]  Patient:    [name], [name] Visit Number: [id number] MRN: [id number]          Service Type: MED Location: [id number] Attending Physician:  Virgina Evener Dictated by:   Marya Fossa, P.A. Admit Date:  02/02/2002 Discharge Date: 02/08/2002   CC:         [name], M.D.  Jonell Cluck, M.D.   Discharge Summary  DATE OF BIRTH:  May 19, 1927  ADMISSION DIAGNOSES: 1. Chest pain, rule out myocardial infarction. 2. Atrial fibrillation of unknown duration. 3. History of palpitations, on Lanoxin therapy. 4. Hypothyroidism. 5. Hypertension. 6. Glaucoma.  DISCHARGE DIAGNOSES: 1. Chest pain, resolved, myocardial infarction ruled out with negative    enzymes. Status post cutting balloon intervention to a diagonal lesion. 2. Right groin hematoma, stable. 3. Atrial fibrillation, present at discharge, INR therapeutic at discharge. 4. Mitral valve regurgitation. 5. Hypokalemia, repleted. 6. History of palpitations, on Lanoxin therapy. 7. Hypothyroidism. 8. Hypertension. 9. Glaucoma.  HISTORY OF PRESENT ILLNESS:  The patient is a 77 year old white female patient who denies any prior cardiac history of coronary disease. Apparently several years ago she was evaluated by Dr. Elsie Lincoln and a 2D echocardiogram showed an EF of 60% and trivial MR.  She has a history of palpitations and has been on Lanoxin therapy.  She also has hypothyroidism, hypertension and glaucoma.  Recently she has noticed some decline in exercise tolerance. Yesterday prior to going to church she experienced substernal chest burning with some shortness of breath. The pain lingered for six hours and then subsided.  She had a recurrent episode today and saw Dr. Patrica Duel.  An EKG suggested atrial fibrillation with a rate of 63 and ST changes in 2, 3, F, V4 through V6, also a poor R-wave progression in V1 through 2. For these reasons the patient  will be admitted to [facility] for heparin. She will need a cardiac catheterization to define anatomy before initiation of Coumadin therapy for atrial fibrillation. We will also need to check thyroid function studies, Lanoxin level, etc.  PROCEDURES PERFORMED: Cardiac catheterization on February 03, 2002, by Dr. Nicki Guadalajara. Complications were right groin hematoma, stable at discharge.  CONSULTATIONS:  None.  HOSPITAL COURSE:  The patient was admitted to [facility] on February 02, 2002, by Dr. Nicki Guadalajara for atrial fibrillation of unknown duration and chest pain. She was started on heparin per pharmacy upon admission, Lopressor, baby aspirin and Protonix. A 2D echocardiogram was ordered.  Admission labs showed a hemoglobin of 14.9, platelets 245, WBCs 6.5. INR 1.2, potassium 3.7, BUN 10, creatinine 0.9. Liver function tests within normal limits. Cardiac enzymes were negative x 3. Total cholesterol 167, triglycerides 271, HDL 39, LDL 74. TSH 2.046. Digoxin 1.0.  The patient was taken to the cardiac catheterization laboratory on February 03, 2002, by Dr. Nicki Guadalajara. This revealed a normal left main, 30% to 40% proximal LAD, 10% LAD at the takeoff of a large diagonal 1 vessel, 85% ostial diagonal 1. The circumflex and RCA were widely patent and free of significant disease. She had 2+ MR on left ventriculogram with normal LV function and a 20% right renal artery stenosis.  Dr. Tresa Endo proceeded with cutting balloon intervention to the diagonal, which the patient tolerated well and reduced the lesion to less than 10%. Post procedure the patient developed a right groin hematoma, and pressure was applied as appropriate. She did develop some ecchymosis. A Doppler  study done of the right groin showed no evidence of pseudoaneurysm or AV fistula. This remained stable for the remainder of the hospital stay.  She did have a formal 2D echocardiogram done while in the hospital, and  this revealed a normal ejection fraction of 55% to 65% with normal systolic function, trivial MR and no evidence of mitral valve prolapse. The left atrial size was the upper limits of normal.  Coumadin and heparin were initiated post catheterization, as the patient continued to be in atrial fibrillation. The goal INR was 2 to 3.  Her INR was 2.1 on February 08, 2002. Her potassium was low on February 06, 2002, and was repleted. A repeat potassium on February 08, 2002, was 4.3.  The patient was felt stable for discharge to home on February 08, 2002, on Coumadin therapy. At the moment we will just try to keep her rate controlled and use Coumadin for stroke prophylaxis. Of note she did develop two 3 second pauses on telemetry with beta blocker dose of 50 mg q.12h. This was reduced to 12.5 mg b.i.d. and the patient had no further pauses. She was ultimately stable for discharge to home on February 08, 2002.  DISCHARGE MEDICATIONS:  1. Coumadin 5 mg q.d.  2. Pravachol 20 mg q.d.  3. Enteric coated aspirin 81 mg.  4. Metoprolol XL 25 mg q.d.  5. Lanoxin 0.125 mg q.d.  7. Synthroid 75 mcg q.d.  8. Norvasc 2.5 mg q.d.  9. Folic acid 2 mg q.d. 10. Vitamin C 500 mg b.i.d. 11. She is to take Lasix only as needed for leg swelling. 12. She can continue Cosopt, Alphagan and Xalatan eye drops as before.  DISCHARGE INSTRUCTIONS:  No strenuous activity, lifting more than 5 pounds or driving for 2 days. Low fat, low cholesterol, low salt diet. She is to get an INR check on Monday, February 09, 2002.  FOLLOWUP:  She needs to call Dr. Reino Kent office for a two week followup appointment.  Dictated by:   Marya Fossa, P.A. Attending Physician:  Virgina Evener DD:  03/10/02 TD:  03/12/02 Job: [id number]

## 2011-01-19 NOTE — Discharge Summary (Signed)
[name], [name]               ACCOUNT NO.:  [id number]   MEDICAL RECORD NO.:  [id number]          PATIENT TYPE:  INP   LOCATION:  0467                         FACILITY[facility]   PHYSICIAN:  Ollen Gross, M.D.    DATE OF BIRTH:  15-Apr-1927   DATE OF ADMISSION:  04/26/2004  DATE OF DISCHARGE:  05/02/2004                                 DISCHARGE SUMMARY   ADMISSION DIAGNOSES:  1.  Right hip displaced femoral neck fracture.  2.  Hypertension.  3.  Atrial fibrillation.  4.  Hypothyroidism.  5.  Diverticulitis.  6.  Right renal cyst.  7.  Status post cardiac catheterization with coronary stenting June of 2003.   DISCHARGE DIAGNOSES:  1.  Displaced right femoral neck fracture status post right hip      hemiarthroplasty.  2.  Postoperative hyponatremia.  3.  Hypertension.  4.  Atrial fibrillation.  5.  Hypothyroidism.  6.  Diverticulitis.  7.  Right renal cyst.  8.  Status post cardiac catheterization with coronary stenting June of 2003.   PROCEDURE:  The patient was taken to the OR on April 28, 2004 and underwent  a right hip hemiarthroplasty.  Surgeon Ollen Gross, M.D.  General  anesthesia. Estimated blood loss was 300 mL, no drains.   BRIEF HISTORY:  [name] is a 77 year old female who sustained a displaced  right femoral neck fracture two days prior to admission. She was on Coumadin  preoperatively and was subsequently admitted to the hospital for a hip  hemiarthroplasty.   CONSULTATIONS:  1.  Cardiology, [name], M.D.  2.  Rehab services, [name], M.D.   LABORATORY DATA:  CBC on admission, hemoglobin __________, hematocrit of  44.7, white cell count 11.1, red cell count 5.11. Postop H&H 14.0 and 40.9.  Last noted H&H 12.4 and 36.8.  PTI/INR on admission elevated at 25.7 and  3.3, followup pro time 20.1 and INR of 2.1 came down to a normal level of  15.9, normal level of INR of 1.4.  She was placed back on her Coumadin  postoperatively.   Last noted PT/INR 16.8 and 1.5 at the time of transfer.  BMET on admission, sodium of 133, glucose of 116, remaining BMET all within  normal limits. She had a magnesium level of 2.2, serial BMET's are followed.  Glucose went up to 116 then 152 back down to 146.  Sodium dropped from 133  to 132. The remaining electrolytes remained within normal limits.  TSH taken  normal at 2.147.  She had DIG level which was taken on April 27, 2004 less  than 0.2.   HOSPITAL COURSE:  The patient was admitted to [facility] for the  above stated problem which was a displaced femoral neck fracture. She was on  Coumadin for chronic atrial fib and had a therapeutic INR so therefore she  was admitted to the hospital, placed at bed rest and placed in traction.  Her Coumadin was held and she was given vitamin K for reversal agent. Labs  rechecked and her INR came back down. A cardiology  consult was called. The  patient was seen in-house by [name].  She has a known history of  chronic atrial fibrillation with coronary arterial disease. She was given  vitamin K for the reversal. She was felt to be clinically stable from a  cardiac standpoint for surgery. The surgery was scheduled for the following  day at April 28, 2004. She was taken to the OR and underwent the above  stated procedure without complications. The patient tolerated the procedure  well and later transferred back to the orthopedic floor for continued  postoperative care.  She was started back on all of her medications. She was  also placed on Lovenox per pharmacy protocol until her Coumadin was  therapeutic.  She actually was doing fairly well from an orthopedic  standpoint. Her pain had actually improved by day one. After the fixation of  the fracture, she was noted to have rate controlled atrial fibrillation. She  was stable from a cardiac standpoint.  Dressing was changed on postoperative  day two.  The incision was healing well.  Physical therapy was initiated and  by day two, she was getting up out of bed ambulating about two steps max  assists. It was felt that she would require some type of skilled care after  the hospital stay.  A rehab consult was called. The patient was seen in  consultation by rehab medicine who felt that she would be a perfect  candidate for inpatient therapy.  It was decided she would be transferred at  which time a bed became available. She remained stable from a cardiac  standpoint. By day three, she had been weaned over to p.o. medications and  the PCA and IV's were discontinued.  She started getting up ambulating  approximately 10 feet, the wound was healing well.  She remained on Lovenox  until her INR was therapeutic.  By day four, she was doing much better and  started getting up walking short distances with assistance.  By day four,  her INR was still not quite therapeutic therefore she continued on her  medications. It was noted later that day that a bed became available at [facility]. It was felt that the patient was stable postoperatively, stable from  a cardiac standpoint and was transferred to rehab for continued care.   DISCHARGE PLAN:  1.  The patient discharged to [facility].  2.  Discharge diagnoses, please see above.  3.  Discharge medications--the patient is to continue her current      medications as per the [facility].  This can be sent over with the patient.   DIET:  Continue with cardiac diet.   ACTIVITY:  She is weightbearing as tolerated to the right lower extremity.  Continue with gait training ambulation and ADL's as per PT and OT.  Also hip  precautions.   WOUND CARE:  Daily wound care.   FOLLOW UP:  The patient is to followup with Dr. Lequita Halt in the office two  weeks from surgery or following the discharge from the rehab unit.   DISPOSITION:  [facility].   CONDITION ON DISCHARGE:  Improved.     ALP/MEDQ  D:  06/07/2004  T:  06/07/2004  Job:   [id number]   cc:   Ollen Gross, M.D.  [facility]  [address]  [address]  [place]  [place] [address]  Fax: [phone]   [name], M.D.  [phone] [address]  [place]  [place] [address]  Fax: [phone]   Patrica Duel, M.D.  [address]  [place]  [place] [address]  Fax: [phone]

## 2011-01-19 NOTE — H&P (Signed)
[facility]  Patient:    [name], [name] Visit Number: [id number] MRN: [id number]          Service Type: MED Location: [phone] Attending Physician:  Virgina Evener Dictated by:   Lennette Bihari, M.D. Admit Date:  02/02/2002   CC:         Jonell Cluck, M.D.  Orville Govern, c/o Lennette Bihari, M.D.   History and Physical  [facility] CHART NUMBER:  [id number]  PATIENT PROFILE:  The patient is a 77 year old white female who I am seeing as an add-on through the courtesy of Dr. Jonell Cluck for evaluation of chest pain.  HISTORY OF PRESENT ILLNESS:  The patient denies any known prior cardiac history of angina or known coronary artery disease.  Apparently, several years ago, she was evaluated by Dr. Madaline Savage and a 2-D echo Doppler study showed normal LV size and function with an EF of 60%.  She had mitral annular calcification with mildly thickened mitral valve leaflets and associated trivial MR.  She had a structurally normal aortic valve without evidence for stenosis or regurgitation.  There was mild TR.  She had mild dilated RV with normal systolic function and mild left atrial enlargement.  She does have a history of palpitations and has been on Lanoxin.  Additional problems have included hypothyroidism and hypertension.  Apparently, recently she has noted some slight decline in exercise tolerance.  Yesterday, prior to going to church, she experienced substernal chest burning associated with some increasing shortness of breath.  The pain lingered for approximately six hours and ultimately subsided.  Today, she experienced a mild recurrent episode of discomfort.  She saw Dr. Jonell Cluck and because of the ECG changes, is here now in our office for evaluation.  ALLERGIES:  She denies any allergies.  There is a questionable history of DYE allergy.  CURRENT MEDICATIONS: 1. Lanoxin 0.125  mg q.d. 2. Synthroid 75 mcg q.d. 3. Lasix 20 mg q.d. 4. Norvasc 2.5 mg q.d. 5. Cosopt, Alphagan and Xalatan eye drops.  SOCIAL HISTORY:  The patient is married for 25 years.  She has two children from a former marriage and four grandchildren.  She lives with her spouse. She is a former Scientist, product/process development.  She graduated the ninth grade.  PAST SURGICAL HISTORY:  Hysterectomy in 1965.  FAMILY HISTORY:  Family history is notable in that both parents are deceased; mother had an MI at age 45; father died of cancer at 67.  Brother died at 24 with cancer.  One sister died at 29 with a stroke; another sister died at 35 with a stroke.  She has two sisters who are alive and well.  HABITS:  There is no tobacco or alcohol use.  REVIEW OF SYSTEMS:  Review of systems is notable for occasional shortness of breath.  She does note some intermittent heartburn.  She does wear glasses. She denies wheezing.  There is no bleeding.  There is no abdominal pain. There is no significant leg swelling.  PHYSICAL EXAMINATION:  VITAL SIGNS:  On exam today, blood pressure is 140/90, pulse of 63.  Weight was 172.  HEENT:  Unremarkable.  NECK:  There was no JVD.  Thyroid was nontender without nodules.  CHEST:  Lungs were clear.  There was no chest wall pain to palpation.  CARDIAC:  Rhythm was regular with a 1/6 systolic murmur.  There was no S3 gallop.  ABDOMEN:  Abdomen was mildly protuberant.  There was no hepatosplenomegaly. There was no abdominal discomfort.  There were no bruits.  Femoral pulses were 2+.  EXTREMITIES:  Distal pulses were 2+.  There was no clubbing, cyanosis, or edema.  NEUROLOGIC:  Exam was grossly nonfocal.  LABORATORY AND ACCESSORY DATA:  EKG today now shows atrial fibrillation at 63 with ST-T changes in II, III and F, V4 through V6, and also poor R wave progression, V1 through V2.  IMPRESSION:  My impression is that the patient presents to the office today after experiencing  six hours of chest burning associated with mild shortness of breath yesterday, with some recurrence today.  Her electrocardiogram now shows atrial fibrillation of questionable duration, which may be new, especially if her above symptoms are ischemia-mediated.  Her last electrocardiogram done in 1991 showed sinus rhythm without ST-T changes.  The ST-T changes may also be somewhat contributed by Lanoxin effect.  After much discussion with her and her sister, it is my recommendation that the patient be admitted to [facility].  She will need to be started on heparin anticoagulation.  Prior to initiation of Coumadin therapy, in light of her six hours of chest discomfort and electrocardiographic changes, I do feel definitive diagnostic cardiac catheterization will be necessary.  I will schedule this to be done tomorrow.  In the interim, we will also add a beta blocker therapy as well as baby aspirin, check thyroid function studies, Lanoxin level and initiate proton pump inhibition with anticoagulation therapy.  Further recommendations will be forthcoming. Dictated by:   Lennette Bihari, M.D. Attending Physician:  Virgina Evener DD:  02/02/02 TD:  02/03/02 Job: [id number]

## 2011-01-19 NOTE — H&P (Signed)
[facility]  Patient:    [name], [name] Visit Number: [id number] MRN: [id number]          Service Type: MED Location: 3A A302 [id number] Attending Physician:  Cassell Smiles. Dictated by:   Elfredia Nevins, M.D. Admit Date:  03/18/2002 Discharge Date: 03/21/2002                           History and Physical  CHIEF COMPLAINT:  Abdominal pain.  HISTORY OF PRESENT ILLNESS:  The patient presented to the office with severe abdominal pain, approximately two days of duration, and progressively getting worse.  She describes it as sudden in onset, associated with loss of appetite and nausea but no vomiting or diarrhea.  No hematemesis, hematochezia, or melena.  Exacerbating factors are movement and coughing.  It is relived somewhat by lying still.  On the pain scale, she describes it as a 10 at its peak; however, it abated somewhat in the office.  She had positive for black stools on review of systems assessment; however, she is already on iron.  PAST MEDICAL HISTORY:  Chronic atrial fibrillation.  Diverticulitis and diverticulosis.  Hyperlipidemia.  High blood pressure.  She is status post angioplasty and catheterization with no coronary artery disease.  MEDICATIONS:  Currently, medications are Coumadin, Synthroid, iron, digoxin, Pravachol, Toprol, and Norvasc.  SOCIAL HISTORY:  She does not smoke or drink.  No alcohol use.  FAMILY HISTORY:  Noncontributory this illness.  REVIEW OF SYSTEMS:  Under HPI.  See review of systems as above.  PHYSICAL EXAMINATION:  SKIN:  Unremarkable.  HEENT:  No JVD or adenopathy.  NECK:  Supple.  CHEST:  Clear.  CARDIOVASCULAR:  Irregular rhythm without murmur, gallop, or rub.  ABDOMEN:  Marked abdominal tenderness in the midepigastric and right upper quadrant area with voluntary guarding and no rebound.  No organomegaly or masses appreciated.  EXTREMITIES:  Without clubbing, cyanosis, or edema.  RECTAL:  Nontender  with negative stool.  NEUROLOGICAL:  Nonfocal.  LABORATORY DATA:  Gallbladder ultrasound and upper abdomen ultrasound which was negative.  A subsequent CT was performed of the abdomen with a known IV dye allergy.  She tolerated this well and on subsequent assessment by radiology, she was found to have a transverse colonic mass with no evidence of perforation at present or obstruction.  Electrocardiogram reveals atrial fibrillation with slow ventricular response at approximately 55 with nonspecific ST and T-wave changes.  No acute ischemic or infarcted changes noted.  Other laboratories included a digoxin level that was 0.3, INR that was 1.7, somewhat therapeutic.  A lipase and amylase that were normal and minimally elevated white blood cell count.  Metabolic studies were unrevealing except for a mild elevation of OT and PT at 41 and 48 respectively, upper limits 37/40 respectively.  Urinalysis was positive for trace hemoglobin.  Cellular microscopy is pending at present.  The patient was subsequently discussed in consultation with Dr. Lionel December, who saw the patient in the emergency department and concurred with admission on Unasyn.  IMPRESSION: 1. Differential diagnostic considerations including microscopic perforation    from a neoplasm of the transverse colon. 2. Intramural hemorrhage secondary to Coumadin use, although doubtful since    she is barely therapeutic, in fact is subtherapeutic. 3. Anticipated colonoscopy.  Admission intravenous antibiotics and pain    management. Dictated by:   Elfredia Nevins, M.D. Attending Physician:  Cassell Smiles DD:  03/18/02 TD:  03/21/02 Job: [id number]  [id number]

## 2011-01-19 NOTE — Op Note (Signed)
[name], [name]                         ACCOUNT NO.:  [id number]   MEDICAL RECORD NO.:  [id number]                   PATIENT TYPE:  INP   LOCATION:  0467                                 FACILITY:  [facility]   PHYSICIAN:  Ollen Gross, M.D.                 DATE OF BIRTH:  08-13-1927   DATE OF PROCEDURE:  04/28/2004  DATE OF DISCHARGE:                                 OPERATIVE REPORT   PREOPERATIVE DIAGNOSIS:  Displaced right femoral neck fracture.   POSTOPERATIVE DIAGNOSIS:  Displaced right femoral neck fracture.   PROCEDURE:  Right hip hemiarthroplasty.   SURGEON:  Aluisio.   ASSISTANT:  None.   ANESTHESIA:  General.   ESTIMATED BLOOD LOSS:  300.   DRAIN:  None.   COMPLICATIONS:  None.   CONDITION:  Stable to recovery.   BRIEF CLINICAL NOTE:  [name] is a 77 year old female who sustained a  displaced right femoral neck fracture two days ago.  She was on Coumadin and  now that her INR has normalized she presents for right hip hemiarthroplasty.   PROCEDURE IN DETAIL:  After the successful administration of general  anesthetic, the patient was placed in the left lateral decubitus position  with the right side up and held with the hip positioner.  The right lower  extremity was isolated from her perineum with plastic drapes and prepped and  draped in the usual sterile fashion.  A standard posterolateral incision was  made with a 10 blade through the subcutaneous tissue to the level of the  fascia lata which was incised in line with the skin incision.  The sciatic  nerve was palpated and protected and short rotators isolated off the femur.  Capsulotomy is performed and the femur then retracted anteriorly to reveal  the completely displaced femoral head.  The head is removed from the  acetabulum and it measures 42 mm in diameter.  The ligament of teres is then  removed.  The femoral neck fracture was rather comminuted.  Using an  oscillating saw to resect the femoral  neck at the appropriate level,  unfortunately we were in good cortical bone.  The femur is then prepared  first with the canal finder and then broaching up to a size 1.  The size 2  broach would not go even half down.  We impacted the size 1 and placed a 28  +1.5 trial with a 42 bipolar trial.  The hip is reduced with excellent  suction fit on the acetabulum.  There was excellent stability with full  extension, full external rotation, 70 degrees flexion, 40 degrees adduction,  90 degrees internal rotation, 90 degrees flexion, 90 degrees internal  rotation.  Placing the right leg on top of the left leg, lengths are equal.  Hip is then dislocated and the trials removed.  The size 2 is the most  appropriate size for cement restrictor.  We  placed at the appropriate depth  in the femoral canal.  I thoroughly irrigated the femoral canal with  pulsatile lavage while the cement was being mixed.  Once cement was mixed  and was ready for implantation, it was injected into the femoral canal and  pressurized.  The Depuy Excel size 1 cemented stem is then placed into the  femoral canal in the appropriate anteversion.  Once the cement was fully  hardened, then the 28 +1.5 head is placed with the 42 bipolar.  The hip is  then reduced with the same stability parameters.  The wound was copiously  irrigated with antibiotic solution and the capsule and short rotators  reattached to femur through drill holes.  The fascia lata is closed with #1  Vicryl interrupted, subcu closed with #1 Vicryl and 2-0 Vicryl interrupted.  Subcuticular is closed with running 4-0 Monocryl.  The incision is cleaned  and dried and Steri-Strips and a bulky sterile dressing applied.  She was  placed with a knee immobilizer, awakened and transported to recovery in  stable condition with intact neurovascular function of the right lower  extremity.                                               Ollen Gross, M.D.    FA/MEDQ  D:   04/28/2004  T:  04/30/2004  Job:  [id number]

## 2011-01-19 NOTE — Discharge Summary (Signed)
NAMEJALIANA, [name]               ACCOUNT NO.:  [id number]   MEDICAL RECORD NO.:  [id number]          PATIENT TYPE:  INP   LOCATION:  3738                         FACILITY:  [facility]   PHYSICIAN:  Darlin Priestly, MD  DATE OF BIRTH:  1926-09-07   DATE OF ADMISSION:  11/16/2004  DATE OF DISCHARGE:  11/18/2004                                 DISCHARGE SUMMARY   DISCHARGE DIAGNOSES:  1. Sick sinus syndrome with sinus bradycardia, status post Medtronic      pacemaker implantation this admission.  2. History of coronary disease, left anterior descending artery      intervention in 2003.  3. Past history of paroxysm atrial fibrillation.  4. Normal left ventricular function.     HOSPITAL COURSE:  Danielle [name] is a 77 year old female followed by Dr. Madaline Savage and Dr. Patrica Duel with history of sick sinus syndrome, PAF and  bradycardia. She has had a previous LAD intervention in 2003 with normal LV  function. She is on no negative chronotropic agents and has had heart rates  in the 40s with falls at home. She was admitted for elective implantation of  pacemaker. This was done November 17, 2004 by Dr. Jenne Campus. She tolerated this  well. She was discharged November 18, 2004. She will follow up in [place].  She was on Coumadin prior to admission and she will resume this in 48 hours.   DISCHARGE MEDICATIONS:  1. Synthroid 0.055 milligrams a day.  2. Pravachol 20 milligrams a day.  3. Norvasc 2.5 milligrams a day.  4. Coumadin 2.5 milligrams a day starting Monday.  5. Eye drops as taken at home.  6. Toprol XL 25 milligrams a day was added.  7. Baby aspirin was to be taken for 5 days.     LABORATORY DATA:  EKG on admission showed sinus bradycardia at 48. She is AV  paced at discharge. Chest x-ray shows cardiomegaly, COPD. Postpacer chest x-  ray shows no pneumothorax.   White count 5.8, hemoglobin 13.8, hematocrit 40.1, platelets 226,000. INR  1.4. Sodium 140, potassium 4.1, BUN  17, creatinine 0.9. Urinalysis  unremarkable.   DISPOSITION:  The patient is discharged in stable condition and will follow-  up with Dr. Jenne Campus in [place]. She will require wound check in about a  week.      LKK/MEDQ  D:  01/07/2005  T:  01/07/2005  Job:  [id number]

## 2011-01-19 NOTE — Consult Note (Signed)
Danielle [name], [name]               ACCOUNT NO.:  [id number]   MEDICAL RECORD NO.:  [id number]          PATIENT TYPE:  INP   LOCATION:  A211                          FACILITY:  [facility]   PHYSICIAN:  Lionel December, M.D.    DATE OF BIRTH:  02/24/1927   DATE OF CONSULTATION:  05/29/2005  DATE OF DISCHARGE:                                   CONSULTATION   REASON FOR CONSULTATION:  Abdominal pain.   HISTORY OF PRESENT ILLNESS:  Danielle [name] is a 77 year old Caucasian female  who was admitted yesterday with approximately 48-hour history of severe  nausea and epigastric burning-type pain.  Pain developed more or less  suddenly.  However, she states she had some similar pain a few weeks ago and  was seen in the ED and was told she had gastritis.  She has not been on any  type of medication for her symptoms.  She has some intermittent heartburn.  Denies any vomiting.  Bowel movements are regular.  No melena or rectal  bleeding, dysphagia or odynophagia.   CURRENT MEDICATIONS:  1.  Levothyroxine 75 mcg daily.  2.  Toprol XL 25 mg daily.  3.  Lunesta 2 mg q.h.s. p.r.n.  4.  Pravachol 20 mg daily.  5.  Coumadin as directed.  6.  Norvasc 2.5 mg daily.  7.  __________.  8.  Alphagan.  9.  Cosopt.  10. Zymar.  11. Econopred.   ALLERGIES:  ORAL CONTRAST CAUSES NAUSEA.  DENIES ANY SERIOUS REACTIONS TO IV  CONTRAST MEDIA.   PAST MEDICAL HISTORY:  1.  History of goiter and is on Levothyroxine.  2.  Hypercholesterolemia.  3.  Glaucoma.  4.  Coronary artery disease status post stent in 2003 of the left anterior      descending artery.  5.  History of khaki-brady syndrome.  Had Medtronic pacemaker placement      March 2006.  6.  History of atrial fibrillation on Coumadin therapy.  7.  Hypertension.  8.  Appendectomy and partial hysterectomy.  9.  History of diverticulosis with diverticulitis in the past.  10. Benign cyst removed from her right breast.  11. She was hospitalized for  pyelonephritis in November 2001.  12. History of right renal cyst.  13. Cataract surgery a couple of weeks ago.  14. Cementing of her lumbar spine within the last 12 months or so.  She      states she was treated for slipped disk in August 2006 during      hospitalization.  She has been doing physical therapy.  15. Right hip hemiarthroplasty due to displaced right femoral neck fracture      August 2005.   FAMILY HISTORY:  Father had stomach cancer.  Sister died of metastatic renal  carcinoma to her brain.  She had previously been treated for breast cancer  as well.  One brother and one sister died of CVA in their 85's.   SOCIAL HISTORY:  She is married and has two sons.  They live locally.  She  worked at [employer] for 23 years.  She did not  smoke cigarettes or drink  alcohol.   REVIEW OF SYSTEMS:  See HPI for GI.  GU:  Denies any dysuria or hematuria.  CARDIOPULMONARY:  Denies chest pain or shortness of breath.   PHYSICAL EXAMINATION:  VITAL SIGNS:  Weight 150 pounds, height 63 inches,  temperature 97.5, pulse 61, respirations 18, blood pressure 156/77.  GENERAL APPEARANCE:  Pleasant, elderly Caucasian female in no acute  distress.  SKIN:  Warm and dry.  No jaundice.  HEENT:  Sclerae are nonicteric.  Conjunctivae are pink.  Oropharyngeal  mucosa moist and pink.  No lesions, erythema or exudate.  No  lymphadenopathy.  CHEST:  Lungs clear to auscultation.  CARDIAC:  Regular rate and rhythm.  No S1, S2.  No murmurs, rubs or gallops.  ABDOMEN:  Positive bowel sounds, obese but symmetrical.  Soft.  She has mild  epigastric tenderness to deep palpation but no rebound tenderness or  guarding.  No organomegaly or masses.  EXTREMITIES:  No edema.   LABORATORY DATA:  CBC, LFT's, amylase and lipase are all normal.  Cardiac  enzymes negative x1.  Potassium is 3.  Glucose 101.   IMPRESSION:  1.  The patient is a 77 year old lady with approximately 72 hour history of      epigastric burning,  severe nausea.  Etiology undetermined at this time.      Need to consider gastritis as well as biliary etiology.  She is having      abdominal ultrasound this morning.  If this is negative, will proceed      with upper endoscopy as next plan of action.  2.  Hypokalemia.   PLAN:  1.  We will review abdominal ultrasound results as available.  2.  Will start IV Protonix 40 mg q.24 h now.  3.  EGD planned if ultrasound is negative.  4.  Replete potassium.   I would like to thank Dr. Patrica Duel for allowing [place] to take part in the  care of this patient.      Danielle [name], P.A.      Lionel December, M.D.  Electronically Signed    LL/MEDQ  D:  05/29/2005  T:  05/29/2005  Job:  [id number]

## 2011-01-19 NOTE — Discharge Summary (Signed)
[name], [name]               ACCOUNT NO.:  [id number]   MEDICAL RECORD NO.:  [id number]          PATIENT TYPE:  INP   LOCATION:  A329                          FACILITY:  [facility]   PHYSICIAN:  Madelin Rear. Sherwood Gambler, MD  DATE OF BIRTH:  1927/06/28   DATE OF ADMISSION:  04/08/2005  DATE OF DISCHARGE:  08/12/2006LH                                 DISCHARGE SUMMARY   DISCHARGE DIAGNOSES:  1.  Compression fracture of vertebra.  2.  Chronic atrial fibrillation.  3.  Osteoporosis.  4.  Hypertension.  5.  Coronary artery disease status post stent.  6.  Long-term Coumadin use.   DISCHARGE MEDICATIONS:  1.  Duragesic patch 50 mcg q.3 d.  2.  Detrol LA 4 mg p.o. daily.  3.  Forteo 20 mcg subcu daily.  4.  Coumadin 2.5 mg p.o. daily.  5.  Os-Cal 500 one daily.  6.  Vitamin D 400 units daily.   SUMMARY:  Patient was admitted with back pain.  She had no obvious trauma or  jarring of the spine.  The pain became severe enough where she was unable to  ambulate or do any self-care.  She was admitted mostly for analgesia and  diagnostic workup.  She was found on workup to have a recurrent compression  fracture status post vertebroplasty of the upper lumbar spine.  Physical  therapy and analgesia resulted in gradual improvement.  She was discharged  for followup with orthopedics as well as in our office.      Madelin Rear. Sherwood Gambler, MD  Electronically Signed     LJF/MEDQ  D:  05/06/2005  T:  05/06/2005  Job:  [id number]

## 2011-01-19 NOTE — Consult Note (Signed)
NAMEENIJAH, FURR               ACCOUNT NO.:  [id number]   MEDICAL RECORD NO.:  [id number]          PATIENT TYPE:  INP   LOCATION:  A329                          FACILITY:  [facility]   PHYSICIAN:  Vickki Hearing, M.D.DATE OF BIRTH:  1927-06-06   DATE OF CONSULTATION:  04/09/2005  DATE OF DISCHARGE:                                   CONSULTATION   CHIEF COMPLAINT:  Back pain.   HISTORY OF PRESENT ILLNESS:  This is a 77 year old female status post  vertebroplasty upper lumbar level, status post right hip replacement by Dr.  Lequita Halt in [place], presents with gradual onset of lower back pain  associated with no significant leg pain and no radicular symptoms.  See  History and Physical for further details.  Past history, social history and  review of systems also recorded.   PHYSICAL EXAMINATION:  VITAL SIGNS:  Stable.  GENERAL APPEARANCE:  Normal.  LYMPH:  Benign cervical spine, supraclavicular area and popliteal fossa and  right and left groin.  SKIN:  No major abnormalities.  NEUROLOGICAL:  Normal reflexes in the knee, 0 equal on the ankles.  Toes are  downgoing.  Straight leg raising is negative.  MUSCULOSKELETAL:  Good strength in the lower extremities on both sides.  Normal hip flexion, status post total hip on the right and normal hip  flexion on the left.  No rotatory pain.  Overall, knee range of motion,  flexes past 90 degrees, no problems.  Muscle tone is normal.  Gross sensory  exam to soft touch is normal as well.  EXTREMITIES:  Upper extremities have no malalignment, contracture,  subluxation, atrophy or tremor.   LABORATORY DATA:  Radiographs show old vertebroplasty, old L3 fracture.   CT scan done in late June, early July shows spinal stenosis, spondylosis and  degenerative disk disease in the lumbar spine.   IMPRESSION:  This most likely represents back pain related to the  spondylosis.   RECOMMENDATIONS:  Recommend IV Solu-Medrol, transition to Arthrotec  or GI  protective antiinflammatory.  Also, add Neurontin titrated up to 300 mg as  tolerated, starting at 100 mg daily.  Use a heating pad also for comfort and  start ambulation training to assess functional status starting tomorrow with  physical therapy.      SEH/MEDQ  D:  04/09/2005  T:  04/10/2005  Job:  [id number]

## 2011-01-19 NOTE — Cardiovascular Report (Signed)
[name], [name]               ACCOUNT NO.:  [id number]   MEDICAL RECORD NO.:  [id number]          PATIENT TYPE:  OIB   LOCATION:  2854                         FACILITY:  [facility]   PHYSICIAN:  Madaline Savage, M.D.DATE OF BIRTH:  [birth year]   DATE OF PROCEDURE:  10/23/2006  DATE OF DISCHARGE:                            CARDIAC CATHETERIZATION   PROCEDURE PERFORMED:  Elective outpatient cardioversion.   PROCEDURES PERFORMED:  An elective outpatient cardioversion with attempt  at tachy pacing cardioversion with the En Rhythm P1501DR by [employer].  The patient was tachy paced on two occasions to try to break the rhythm  back to normal but that was unsuccessful.  Thereafter, we proceeded with  elective cardioversion which was successful.   OPERATOR:  Madaline Savage, MD.   ANESTHESIOLOGIST:  Arta Bruce, MD.   ENERGIES USED:  150 joules synchronized with anterior-posterior pads on  the Zoll biphasic pacemaker defibrillator.   MEDICATIONS GIVEN:  150 mg Pentothal.   SHOCKS:  One.   RESULTS:  Successful restoration of sinus rhythm with atrial pacing and  ventricular tracking.   COMPLICATIONS:  None.   PLAN:  The patient will be discharged in 30-60 minutes.  She will  continue on her usual medication and will be followed up in the  [facility] in [place] by Dr. Madaline Savage.           ______________________________  Madaline Savage, M.D.     WHG/MEDQ  D:  10/23/2006  T:  10/23/2006  Job:  [id number]   cc:   [facility]  Patrica Duel, M.D.

## 2011-01-19 NOTE — Op Note (Signed)
[name], [name]               ACCOUNT NO.:  [id number]   MEDICAL RECORD NO.:  [id number]          PATIENT TYPE:  AMB   LOCATION:  SDS                          FACILITY:  [facility]   PHYSICIAN:  Jillyn Hidden A. Rankin, M.D.   DATE OF BIRTH:  December 19, 1926   DATE OF PROCEDURE:  05/14/2005  DATE OF DISCHARGE:  05/14/2005                                 OPERATIVE REPORT   PREOPERATIVE DIAGNOSIS:  Epiretinal membrane, OD.   POSTOPERATIVE DIAGNOSIS:  Epiretinal membrane, OD.   PROCEDURE:  Posterior vitrectomy and membrane peel, OD.   ANESTHESIA:  Local retrobulbar with monitored anesthesia control.   SURGEON:  Alford Highland. Rankin, M.D.   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  This is an attempt to release the traction on  the macular region as well as to reduce the topographic distortion in an  attempt to improve visual function and visual acuity.  The patient  understands the risks of anesthesia including the rare occurrence of death,  the risks to the eye including but not limited to hemorrhage, infection,  scarring, need for further surgery, no change in vision, loss of vision,  progression of disease despite intervention.  Appropriate signed consent was  obtained.  The patient was taken to the operating room.   DESCRIPTION OF PROCEDURE:  In the operating room, appropriate monitoring was  followed by mild sedation.  0.75% Marcaine was delivered 5 mL retrobulbar  and an additional 5 mL laterally in the fashion of modified Darel Hong.  The  right periocular region was sterilely prepped and draped in the usual  ophthalmic fashion.  The microscope was placed in position.  A 25 gauge  vitrectomy was carried out by placing the infusion trocar in the  inferotemporal quadrant.  Thereafter, the superior trocars were applied.  A  core vitrectomy was then begun.  An  iatrogenic posterior hyaloid removal was  necessary.  The vitreous skirt was circumcised and trimmed 360 degrees.  At  this time, the 25 gauge membrane peel forceps were then used to peel the  epiretinal membrane, then the internal limiting membrane was removed in this  fashion from the macular region.  Improved topographic appearance occurred.  At this time, the instruments were removed from the eye.  The vitrectomy  around the sites of the trocar superiorly was then carried out more  carefully to diminish the risks of vitreous incarceration.  Under low  infusion pressures, the trocars were removed.  The infusion was similarly  removed.  Subconjunctival injection of Decadron was placed in the  inferonasal quadrant.  The intraocular pressure was found to be adequate.  The lid speculum was removed.  A sterile patch and Fox shield were applied.  The patient was taken to the short stay unit and discharged home as an  outpatient.      Alford Highland Rankin, M.D.  Electronically Signed    GAR/MEDQ  D:  05/14/2005  T:  05/14/2005  Job:  [id number] 

## 2011-01-19 NOTE — H&P (Signed)
[name], [name]               ACCOUNT NO.:  [id number]   MEDICAL RECORD NO.:  [id number]          PATIENT TYPE:  EMS   LOCATION:  ED                            FACILITY:  [facility]   PHYSICIAN:  Madelin Rear. Sherwood Gambler, MD  DATE OF BIRTH:  May 02, 1927   DATE OF ADMISSION:  04/08/2005  DATE OF DISCHARGE:  LH                                HISTORY & PHYSICAL   CHIEF COMPLAINT:  Back pain.   HISTORY OF PRESENT ILLNESS:  The patient had 12 to 24 hours of progressively  increasing back pain without known trauma.  She states she just drove to [facility] and developed some pain upon arrival home.  There was no big bumps in  the road, or falls, or jarring of the spinal collum, or other acute trauma.  She complains of mid to low back pain in the lumbar area.  There is no  radiation of the pain or associated radicular nature of the pain.  No  neurologic events or symptoms.  She denied anything except upon arrival to  the emergency department developing a fever and chills, although she had no  true rigors.  There is no ampecedent illness or prodromal symptoms.  She  complained of dysuria and lower abdominal pain.  She had one episode of  diarrhea, describing a mucoid stool that was heme negative grossly.   PAST MEDICAL HISTORY:  1.  Hypertension.  2.  Coronary artery disease, status post stent implantation.  3.  Paroxysmal atrial fibrillation, on long-term anticoagulate therapy.  4.  Status post pacemaker implantation for sick sinus syndrome.  5.  Status post vertebroplasty by invasive radiology.  6.  Status post hip fracture.   SOCIAL HISTORY:  Nonsmoker, nondrinker, no alcohol use or other drug use.   FAMILY HISTORY:  Noncontributory.   REVIEW OF SYSTEMS:  As under HPI, all else is negative.   PHYSICAL EXAMINATION:  GENERAL:  She appears uncomfortable secondary to  pain.  HEAD/NECK:  No JVD or adenopathy.  Neck is supple.  CHEST:  Clear.  CARDIAC:  Regular rhythm at present without gallop  or rub.  ABDOMEN:  Soft, minimal left lower quadrant abdominal tenderness, no  guarding or rebound.  EXTREMITIES:  1+ bipedal edema.  NEUROLOGIC:  Nonfocal.   LABORATORY DATA:  Unrevealing.  She has a normal blood calcium level.  X-  rays showed a worsening of a compression fracture adjacent to her previous  vertebroplasty.   IMPRESSION:  1.  New L1 compression fracture/recurrent acute.  She will be admitted for      bed rest, analgesia, orthopaedic consultation.  2.  Severe osteoporosis.  The patient has been intolerant to      bisphosphonates.  She has tried Miacalcin, was unable to tolerate oral      Fosamax or Actonel.  She maintains herself on vitamin D and calcium      supplementation.  She is on no estrogens.  We will add injectable Forteo      to bypass gastrointestinal intolerance and nasal problems with sprays.      We will  also continue her regimen of calcium and vitamin D.  3.  Hypertension.  Well controlled at present.  Monitor acute intervention.  4.  Abdominal pain.  Probably bladder spasm.  We will start some Detrol LA.      Cover her with some antibiotics due to bacteruria noted on urinalysis.  5.  Paroxysmal atrial fibrillation.  She does not require monitoring at      present as she appears to be in sinus rhythm clinically.  6.  Long-term Coumadin.  Check her prothrombin time, continue Coumadin in      house.       LJF/MEDQ  D:  04/08/2005  T:  04/08/2005  Job:  [id number]

## 2011-01-19 NOTE — Discharge Summary (Signed)
   [name], [name]                         ACCOUNT NO.:  [id number]   MEDICAL RECORD NO.:  [id number]                   PATIENT TYPE:  INP   LOCATION:  A302                                 FACILITY:  [facility]   PHYSICIAN:  Madelin Rear. Sherwood Gambler, M.D.             DATE OF BIRTH:  [birth year]   DATE OF ADMISSION:  03/18/2002  DATE OF DISCHARGE:  03/21/2002                                 DISCHARGE SUMMARY   DISCHARGE DIAGNOSES:  1. Diverticulitis.  2. Atrial fibrillation.  3. Hypertension.  4. Hyperlipidemia.  5. Benign neoplasm of the large bowel.   DISCHARGE MEDICATIONS:  1. Cipro 500 mg p.o. b.i.d.  2. Flagyl 500 mg p.o. b.i.d.  3. Synthroid 75 mcg p.o. q.d.  4. Digoxin 0.125 mg p.o. q.d.  5. Coumadin.  6. Supplemental iron.  7. Toprol XL 25 mg p.o. q.d.  8. Pravachol 10 mg p.o. q.d.   HOSPITAL COURSE:  The patient was admitted because of upper abdominal pain.  She had noted by admission CT severe inflammatory changes consistent with  diverticulitis, but concerns over her abdominal tenderness over a  microperforation were present.  She was admitted for intense IV therapy and  was started on Unasyn as well as analgesics.  She did extremely well, with  remarkable improvement in a timely fashion.  She was subsequently seen in  consultation by gastroenterology.  A repeat CT scan was scheduled  postdischarge.  She was discharged on appropriate antibiotics, for follow-up  in the office.                                               Madelin Rear. Sherwood Gambler, M.D.    LJF/MEDQ  D:  04/30/2002  T:  05/01/2002  Job:  [id number]

## 2011-01-19 NOTE — Consult Note (Signed)
[facility]  Patient:    [name], [name] Visit Number: [id number] MRN: [id number]          Service Type: MED Location: 3A A302 01 Attending Physician:  Cassell Smiles. Dictated by:   Lionel December, M.D. Proc. Date: 03/18/02 Admit Date:  03/18/2002 Discharge Date: 03/21/2002                            Consultation Report  REASON FOR CONSULTATION:  Upper abdominal pain.  CT shows inflammatory changes/mass involving transverse colon.  HISTORY OF PRESENT ILLNESS:  The patient is a 77 year old Caucasian female who was in her usual state of health until two days ago when she noted pain across her upper abdomen.  The pain was mild.  This was followed by nausea and vomiting.  She has not experienced any hematemesis or melena.  She did have chills but no fever.  She was up all night.  Yesterday, she had more pain but she kept hoping that she would get better.  Finally, she was seen by Dr. Elfredia Nevins today who noted her to be quite tender in her upper abdomen. The patient was sent over to the emergency room for evaluation.  The patient was seen by Dr. Margarita Grizzle and had laboratory studies as well as CT.  Dr. Elfredia Nevins was informed of the results and requested GI consultation.  Her ultrasound shows a CBD of 8 mm without cholelithiasis.  She has a tiny stone in her right kidney as well as right renal cyst.  Pancreas was not well seen.  She then had abdominal CT.  I will review this CT with Dr. Manson Passey.  It shows thickening to the mid-transverse colon.  There is inflammatory changes surrounding the colon.  There is a suspicion of mass and looks like an apple core.  The patient denies melena or rectal bleeding.  About three weeks ago, she was begun on iron by her cardiologist because her hemoglobin was low but no blood was found in her stool.  She has lost a few pounds recently.  She states she had normal stool yesterday.  She has not experienced any  changes in her bowel habits.  She denies chronic heartburn or dysphagia.  She also denies dyspnea or chest pain.  MEDICATIONS: 1. Presently on Norvasc 2.5 mg q.d. 2. Synthroid 75 mcg q.d. 3. Lanoxin 0.125 mg q.d. 4. She is also on Coumadin. 5. Iron pills. 6. Toprol XL 25 mg q.d. 7. Pravachol 10 mg q.d.  PAST MEDICAL HISTORY:  She has hypertension, hypothyroidism, and she was recently diagnosed with coronary artery disease.  She had one of her arteries stented.  At that time, she was begun on anticoagulants which was about three weeks ago.  Last week, her INR was elevated and her Coumadin was held for two days.  She has had an appendectomy and hysterectomy.  She has a history of diverticulosis and diverticulitis.  She has had a benign cyst removed from her right breast.  She was hospitalized in November 2001 for pyelonephritis.  She was seen by Dr. Dennie Maizes and no obstructing lesion was found.  ALLERGIES:  NKDA.  FAMILY HISTORY:  Four siblings have diseases.  One sister died of metastatic renal carcinoma to her brain.  She had previously been treated for breast carcinoma.  One brother and one sister died of a CVA in their 66s.  Another brother died at 82.  She has  four siblings living.  SOCIAL HISTORY:  She is married.  She worked at [employer] for 23 years.  She does not smoke cigarettes or drink alcohol.                                                     PHYSICAL EXAMINATION:  GENERAL:  She is a pleasant, mildly obese Caucasian female who does not appear to be in acute distress when seen in the emergency room.  VITAL SIGNS:  Admission weight is pending.  Pulse is 57 per minute.  Regular blood pressure 145/65, respirations are 20, and temperature is 97.2.  HEENT:  Conjunctivae is pink.  Sclerae is nonicteric.  Oropharyngeal mucosa is normal.  NECK:  Without masses or thyromegaly.  CARDIOVASCULAR:  Regular rhythm.  Normal S1 and S2.  No murmur or  gallop noted.  LUNGS:  Clear to auscultation.  ABDOMEN:  Obese.  Bowel sounds are normal.  No bruits noted.  Her abdomen is soft.  She has moderate tenderness in the midepigastric area with guarding but no rebound.  No masses or organomegaly noted.  RECTAL:  Performed by Dr. Margarita Grizzle and guaiac negative.  EXTREMITIES:  She does not have peripheral edema or clubbing.  LABORATORY DATA:  Her digoxin level is 0.3.  PT is 16.6 with INR of 1.7.  PTT is 27.  Her amylase is 28, lipase is 15.  Her WBC is 11.7.  H&H is 13.8 and 40.7.  Platelet count is 254,000.  Her sodium is 137, potassium 4.1, chloride 105, CO2 29, glucose is 103, BUN 10, creatinine 0.9, calcium 10, albumin is 3.6, bilirubin is 0.6.  AST is 41 and ALT is 48.  Ultrasound and CT findings are as above.  ASSESSMENT: 1. The patient is a 77 year old female who presents with a two-day history of    upper abdominal pain associated with nausea and vomiting.  She was noted to    have mild leukocytosis.  She also has mildly AST and ALT; however, on CT    she was noted to have a thickened segment of transverse colon with    pericolic inflammatory changes and suspicion of an apple core.  There is no    pneumoperitoneum.  Ultrasound reveals mildly dilated bile duct of 8 mm with    no evidence of cholelithiasis or choledocholithiasis.  The patients    presentation is suspicious for the following, i.e. she could have colon    carcinoma of transverse colon with either necrosis or microscopic    perforation resulting in her symptomatology.  Other possibilities would    be intramural hemorrhage, segmental ischemia, diverticulitis of transverse    colon would be quite unusual.  She does not have an acute abdomen and I do    not feel that she needs to have surgical opinion at the present time but    probably will need it before too long. 2. Mildly elevated transaminases with mildly dilated bile duct:  This needs    to be followed;  however, I do not feel that her symptoms are biliary in    nature.  RECOMMENDATIONS:  She will be started on Unasyn 1.5 gm IV q.6h.  CBC will be  repeated in the a.m.  We will continue cardiac medications but hold her Coumadin.  She will be re-evaluated in the  a.m.  If she continues to feel better, would consider prepping her for a colonoscopy to be performed on March 20, 2002.  Her LFTs will need to be repeated in a couple of days.  I would like to thank Dr. Elfredia Nevins for sharing care of this nice lady. Dictated by:   Lionel December, M.D. Attending Physician:  Cassell Smiles DD:  03/18/02 TD:  03/21/02 Job: [id number]

## 2011-01-19 NOTE — H&P (Signed)
[name], [name]                         ACCOUNT NO.:  [id number]   MEDICAL RECORD NO.:  [id number]                   PATIENT TYPE:  INP   LOCATION:  0467                                 FACILITY:  [facility]   PHYSICIAN:  Ollen Gross, M.D.                 DATE OF BIRTH:  [birth year]   DATE OF ADMISSION:  04/26/2004  DATE OF DISCHARGE:                                HISTORY & PHYSICAL   CHIEF COMPLAINT:  Right hip pain.   HISTORY OF PRESENT ILLNESS:  The patient is a 77 year old female who was  transferred to [facility] via [facility] after sustaining  a fall earlier at [facility] today.  She denies any syncopal or presyncopal  episode earlier this morning between the hour of 8 and 9, but she states her  hip gave way.  She unfortunately sustained a fall injuring her right hip.  She was transported to [facility] where x-rays proved positive for  a hip fracture.  Due to her cardiac history, she was transported to  [facility] for further care of her hip fracture.  A family member had been  treated by [facility] in the past.  They requested GOC.  Dr.  Homero Fellers Aluisio was on call.  She was transported to [facility] as a  direct admit.  She was seen and evaluated and admitted for a displaced  femoral neck fracture.  She has an extensive cardiac history, and currently  on Coumadin with a supratherapeutic INR.  She will be admitted for  normalization of her Coumadin, placed to bed rest, and plan for surgery in  the near future.   ALLERGIES:  No known drug allergies.   CURRENT MEDICATIONS:  1. Coumadin.  2. Norvasc.  3. Synthroid.  4. Pravachol.  5. Cosopt.  6. Xalatan.  7. Another eye drop she does not recall.  8. She states that she had recently been put on another heart medication by     Dr. Elsie Lincoln.   PAST MEDICAL HISTORY:  1. Hypertension.  2. Heart arrhythmia, which sounds like paroxysmal atrial fibrillation.  3.  Hypothyroidism.  4. Diverticulosis.  5. Coronary artery disease.  6. History of a right renal cyst.   PAST SURGICAL HISTORY:  1. Cardiac catheterization with stenting of one vessel in June 2003, per Dr.     Elsie Lincoln.  2. Bilateral laser eye procedures.  3. Appendectomy.  4. Partial hysterectomy.   SOCIAL HISTORY:  She is married.  She has two children.  Denies use of  alcohol or tobacco products.   FAMILY HISTORY:  Her mother deceased at age 25 with a history of massive MI.  Her father deceased also at age 44 with a history of stomach cancer.   REVIEW OF SYSTEMS:  GENERAL:  No fevers, chills, or night sweats.  NEUROLOGIC:  No seizures, syncope, or paralysis.  No episode associated with  the fall.  RESPIRATORY:  No shortness of breath, productive cough, or  hemoptysis.  CARDIOVASCULAR:  No chest pain, angina.  She does have some  occasionally what she calls heart racing at night.  This is improved since  the administration of a new medication by Dr. Elsie Lincoln.  GASTROINTESTINAL:  She does have a history of diverticulosis, she has not had any problems.  She did have a bowel movement yesterday on April 25, 2004.  No blood or  mucus in the stool.  No nausea or vomiting.  GENITOURINARY:  No hematuria,  dysuria, or discharge.  Currently has Foley.  MUSCULOSKELETAL:  Pertinent to  that of the right hip found in the history of present illness.   PHYSICAL EXAMINATION:  VITAL SIGNS:  Temperature 97.5 pulse 71, respirations  20, blood pressure 126/86.  GENERAL:  The patient is a 77 year old female, well-developed, well-  nourished, appears to be in no acute distress.  She is alert and oriented  and cooperative.  Very pleasant at time of examination.  Appears to be a  good historian.  She is accompanied by her family.  Her husband and one of  her sons in the room at this time.  HEENT:  Normocephalic, atraumatic.  Pupils equal, round, reactive to light.  Oropharynx clear.  EOM's are intact.   NECK:  Supple.  No carotid bruits are appreciated on examination.  CHEST:  Clear anterior and posterior chest walls.  No wheezes, rhonchi, or  rales is appreciated on examination.  HEART:  Regular rhythm with occasional ectopic beat versus a skipped beat on  examination.  Otherwise, appears to have a normal S1 and S2.  No murmurs are  appreciated.  No rubs, thrills, or palpitations.  ABDOMEN:  Soft, round abdomen, nontender, bowel sounds are present.  RECTAL:  Not done, not pertinent to present illness.  BREASTS:  Not done, not pertinent to present illness.  GENITALIA:  Not done, not pertinent to present illness.  It is noted that  she does have a Foley catheter in place.  EXTREMITIES:  Significant to the right lower extremity.  Motor function is  intact throughout the right leg.  The right lower extremity is slightly  shortened and somewhat externally rotated as compared to the left leg.  Motor function is intact.  Sensation is intact throughout the extremity.   LABORATORY DATA:  X-rays:  X-rays are reviewed by Dr. Lequita Halt and found to  have a displaced right femoral neck fracture.  No evidence of bony masses.   Admission CBC showed a slightly elevated white count of 15.1, hemoglobin of  17, hematocrit of 49.2.  Prothrombin time elevated at 26.8.  INR elevated at  3.6 (currently on Coumadin).  PTT elevated at 39.  Chem panel shows a sodium  of 134, potassium of 3.5, chloride 108, bicarbonate of 22, BUN 14,  creatinine 0.9, glucose 121.  UA:  Small blood, otherwise negative.   EKG:  There is an EKG report that came up from [facility].  It  shows atrial fibrillation with a mean ventricular rate of about 71,  nonspecific inferior T-wave abnormality, extensive anterior infarct, age  undetermined.  Q's are noted in V1 through V6.  Cannot exclude ischemia.  This is initialed as a confirmed EKG.   IMPRESSION:  1. Right hip displaced femoral neck fracture.  2. Hypertension. 3.  Atrial fibrillation.  4. Hypothyroidism.  5. Diverticulitis.  6. Right renal cyst.  7. Status post cardiac catheterization with coronary  stenting, June 2003.   PLAN:  The patient will be placed at bed rest and placed in Buck's traction  for comfort.  She is currently on Coumadin.  The Coumadin will be held.  The  patient will require surgical intervention in the form of a right hip  hemiarthroplasty.  Her cardiologist is Dr. Elsie Lincoln.  We will consult Dr.  Truett Perna group for assistance with normalization of her Coumadin and also  perioperative and postoperative care of this nice patient.     Alexzandrew L. Julien Girt, P.A.              Ollen Gross, M.D.    ALP/MEDQ  D:  04/26/2004  T:  04/26/2004  Job:  [id number]   cc:   Patrica Duel, M.D.  [address], [address]  [place]  [place] [address]  Fax: [phone]   Madaline Savage, M.D.  [phone]  [place]  [place] [address]  Fax: [phone]

## 2011-04-24 ENCOUNTER — Other Ambulatory Visit: Payer: Self-pay | Admitting: *Deleted

## 2011-04-24 NOTE — Telephone Encounter (Signed)
Denied pain med has not been seen since 05/2010.

## 2011-04-26 ENCOUNTER — Ambulatory Visit (HOSPITAL_COMMUNITY)
Admission: RE | Admit: 2011-04-26 | Discharge: 2011-04-26 | Disposition: A | Discharge status: Home / Self Care | Payer: Medicare Other | Source: Ambulatory Visit | Attending: Cardiovascular Disease | Admitting: Cardiovascular Disease

## 2011-04-26 DIAGNOSIS — R0609 Other forms of dyspnea: Secondary | ICD-10-CM | POA: Insufficient documentation

## 2011-04-26 DIAGNOSIS — R0989 Other specified symptoms and signs involving the circulatory and respiratory systems: Secondary | ICD-10-CM | POA: Insufficient documentation

## 2011-04-26 LAB — PULMONARY FUNCTION TEST

## 2011-04-27 ENCOUNTER — Ambulatory Visit (INDEPENDENT_AMBULATORY_CARE_PROVIDER_SITE_OTHER): Payer: Medicare Other | Admitting: Urology

## 2011-04-27 ENCOUNTER — Other Ambulatory Visit: Payer: Self-pay | Admitting: Urology

## 2011-04-27 DIAGNOSIS — N3020 Other chronic cystitis without hematuria: Secondary | ICD-10-CM

## 2011-04-27 DIAGNOSIS — N8111 Cystocele, midline: Secondary | ICD-10-CM

## 2011-04-27 DIAGNOSIS — N952 Postmenopausal atrophic vaginitis: Secondary | ICD-10-CM

## 2011-04-27 DIAGNOSIS — R339 Retention of urine, unspecified: Secondary | ICD-10-CM

## 2011-04-30 NOTE — Procedures (Signed)
NAMECALVINA, [name]               ACCOUNT NO.:  [id number]  MEDICAL RECORD NO.:  [id number]  LOCATION:  RESP                          FACILITY:  [facility]  PHYSICIAN:  Avamae Dehaan L. Juanetta Gosling, M.D.DATE OF BIRTH:  [birth year]  DATE OF PROCEDURE: DATE OF DISCHARGE:  04/26/2011                           PULMONARY FUNCTION TEST   Reason for pulmonary function testing is amiodarone therapy. 1. Spirometry shows a mild ventilatory defect with airflow     obstruction. 2. Lung volumes show normal. 3. DLCO is moderately reduced.  This study is consistent with     amiodarone therapy and clinical correlation is suggested.     [name] L. Juanetta Gosling, M.D.     ELH/MEDQ  D:  04/30/2011  T:  04/30/2011  Job:  [id number]

## 2011-05-01 ENCOUNTER — Ambulatory Visit (HOSPITAL_COMMUNITY)
Admission: RE | Admit: 2011-05-01 | Discharge: 2011-05-01 | Disposition: A | Discharge status: Home / Self Care | Payer: Medicare Other | Source: Ambulatory Visit | Attending: Urology | Admitting: Urology

## 2011-05-01 ENCOUNTER — Other Ambulatory Visit (HOSPITAL_COMMUNITY): Payer: Self-pay | Admitting: Family Medicine

## 2011-05-01 DIAGNOSIS — N3020 Other chronic cystitis without hematuria: Secondary | ICD-10-CM | POA: Insufficient documentation

## 2011-05-01 DIAGNOSIS — I10 Essential (primary) hypertension: Secondary | ICD-10-CM | POA: Insufficient documentation

## 2011-05-01 DIAGNOSIS — Q619 Cystic kidney disease, unspecified: Secondary | ICD-10-CM | POA: Insufficient documentation

## 2011-05-01 DIAGNOSIS — Z139 Encounter for screening, unspecified: Secondary | ICD-10-CM

## 2011-05-10 ENCOUNTER — Ambulatory Visit (INDEPENDENT_AMBULATORY_CARE_PROVIDER_SITE_OTHER): Payer: Medicare Other | Admitting: Orthopedic Surgery

## 2011-05-10 ENCOUNTER — Encounter: Payer: Self-pay | Admitting: Orthopedic Surgery

## 2011-05-10 DIAGNOSIS — R52 Pain, unspecified: Secondary | ICD-10-CM

## 2011-05-10 MED ORDER — HYDROCODONE-ACETAMINOPHEN 7.5-325 MG PO TABS: 1.0000 | ORAL_TABLET | Freq: Four times a day (QID) | ORAL | Status: AC | PRN

## 2011-05-10 NOTE — Progress Notes (Signed)
Chief complaint Followup med refill chronic pain on operable spinal stenosis  Pain is increased in the RIGHT lower extremity and occasionally has giving out of the RIGHT leg.  Pain is located along the lateral portion of the lower extremity running from the hip down into the foot.  I increased her hydrocodone 7.5 mg.  She is pretty much limited to pain management at this time  Impression spinal stenosis

## 2011-05-11 ENCOUNTER — Encounter (HOSPITAL_COMMUNITY): Payer: Self-pay

## 2011-05-14 ENCOUNTER — Emergency Department (HOSPITAL_COMMUNITY): Payer: Medicare Other

## 2011-05-14 ENCOUNTER — Inpatient Hospital Stay (HOSPITAL_COMMUNITY): Payer: Medicare Other

## 2011-05-14 ENCOUNTER — Encounter (HOSPITAL_COMMUNITY): Payer: Self-pay | Admitting: *Deleted

## 2011-05-14 ENCOUNTER — Inpatient Hospital Stay (HOSPITAL_COMMUNITY)
Admission: EM | Admit: 2011-05-14 | Discharge: 2011-05-18 | DRG: 552 | Disposition: A | Discharge status: Home Health | Payer: Medicare Other | Attending: Internal Medicine | Admitting: Internal Medicine

## 2011-05-14 ENCOUNTER — Other Ambulatory Visit: Payer: Self-pay

## 2011-05-14 DIAGNOSIS — I495 Sick sinus syndrome: Secondary | ICD-10-CM | POA: Diagnosis present

## 2011-05-14 DIAGNOSIS — M5430 Sciatica, unspecified side: Secondary | ICD-10-CM | POA: Diagnosis present

## 2011-05-14 DIAGNOSIS — I4891 Unspecified atrial fibrillation: Secondary | ICD-10-CM | POA: Diagnosis present

## 2011-05-14 DIAGNOSIS — R52 Pain, unspecified: Secondary | ICD-10-CM

## 2011-05-14 DIAGNOSIS — Z950 Presence of cardiac pacemaker: Secondary | ICD-10-CM

## 2011-05-14 DIAGNOSIS — Z96649 Presence of unspecified artificial hip joint: Secondary | ICD-10-CM

## 2011-05-14 DIAGNOSIS — E039 Hypothyroidism, unspecified: Secondary | ICD-10-CM | POA: Diagnosis present

## 2011-05-14 DIAGNOSIS — I480 Paroxysmal atrial fibrillation: Secondary | ICD-10-CM | POA: Diagnosis present

## 2011-05-14 DIAGNOSIS — I10 Essential (primary) hypertension: Secondary | ICD-10-CM | POA: Diagnosis present

## 2011-05-14 DIAGNOSIS — M47817 Spondylosis without myelopathy or radiculopathy, lumbosacral region: Secondary | ICD-10-CM | POA: Diagnosis present

## 2011-05-14 DIAGNOSIS — X58XXXA Exposure to other specified factors, initial encounter: Secondary | ICD-10-CM | POA: Diagnosis present

## 2011-05-14 DIAGNOSIS — M810 Age-related osteoporosis without current pathological fracture: Secondary | ICD-10-CM | POA: Diagnosis present

## 2011-05-14 DIAGNOSIS — G894 Chronic pain syndrome: Secondary | ICD-10-CM | POA: Diagnosis present

## 2011-05-14 DIAGNOSIS — S32009A Unspecified fracture of unspecified lumbar vertebra, initial encounter for closed fracture: Principal | ICD-10-CM | POA: Diagnosis present

## 2011-05-14 HISTORY — DX: Pure hypercholesterolemia, unspecified: E78.00

## 2011-05-14 LAB — URINALYSIS, ROUTINE W REFLEX MICROSCOPIC
Glucose, UA: NEGATIVE mg/dL
Hgb urine dipstick: NEGATIVE
Ketones, ur: NEGATIVE mg/dL
Protein, ur: NEGATIVE mg/dL
Urobilinogen, UA: 0.2 mg/dL (ref 0.0–1.0)

## 2011-05-14 LAB — CBC
HCT: 38.9 % (ref 36.0–46.0)
MCH: 29.9 pg (ref 26.0–34.0)
MCHC: 32.4 g/dL (ref 30.0–36.0)
MCV: 92.4 fL (ref 78.0–100.0)
Platelets: 237 10*3/uL (ref 150–400)
RDW: 15.3 % (ref 11.5–15.5)

## 2011-05-14 LAB — DIFFERENTIAL
Basophils Absolute: 0 10*3/uL (ref 0.0–0.1)
Eosinophils Absolute: 0.1 10*3/uL (ref 0.0–0.7)
Eosinophils Relative: 1 % (ref 0–5)
Monocytes Absolute: 1 10*3/uL (ref 0.1–1.0)

## 2011-05-14 LAB — BASIC METABOLIC PANEL
CO2: 26 mEq/L (ref 19–32)
Calcium: 9.5 mg/dL (ref 8.4–10.5)
Creatinine, Ser: 0.84 mg/dL (ref 0.50–1.10)
GFR calc non Af Amer: >60 mL/min (ref >60)
Glucose, Bld: 109 mg/dL — ABNORMAL HIGH (ref 70–99)
Sodium: 138 mEq/L (ref 135–145)

## 2011-05-14 MED ORDER — HYDROCODONE-ACETAMINOPHEN 5-325 MG PO TABS
1.5000 | ORAL_TABLET | Freq: Four times a day (QID) | ORAL | Status: DC | PRN
  Administered 2011-05-14 – 2011-05-18 (×9): 1.5 via ORAL
  Filled 2011-05-14 (×11): qty 2

## 2011-05-14 MED ORDER — HYDROCODONE-ACETAMINOPHEN 7.5-325 MG PO TABS: 1.0000 | ORAL_TABLET | Freq: Four times a day (QID) | ORAL | Status: DC | PRN

## 2011-05-14 MED ORDER — ACETAMINOPHEN 325 MG PO TABS: 650.0000 mg | ORAL_TABLET | Freq: Four times a day (QID) | ORAL | Status: DC | PRN

## 2011-05-14 MED ORDER — WARFARIN SODIUM 2.5 MG PO TABS
2.5000 mg | ORAL_TABLET | ORAL | Status: DC
  Administered 2011-05-15 – 2011-05-17 (×2): 2.5 mg via ORAL
  Filled 2011-05-14 (×3): qty 1

## 2011-05-14 MED ORDER — AMIODARONE HCL 200 MG PO TABS
200.0000 mg | ORAL_TABLET | Freq: Every day | ORAL | Status: DC
  Administered 2011-05-15 – 2011-05-18 (×4): 200 mg via ORAL
  Filled 2011-05-14 (×5): qty 1

## 2011-05-14 MED ORDER — ONDANSETRON HCL 4 MG/2ML IJ SOLN
4.0000 mg | Freq: Once | INTRAMUSCULAR | Status: AC
  Administered 2011-05-14: 4 mg via INTRAMUSCULAR
  Filled 2011-05-14: qty 2

## 2011-05-14 MED ORDER — BRIMONIDINE TARTRATE 0.2 % OP SOLN
OPHTHALMIC | Status: AC
  Filled 2011-05-14: qty 5

## 2011-05-14 MED ORDER — WARFARIN SODIUM 2.5 MG PO TABS
1.2500 mg | ORAL_TABLET | ORAL | Status: DC
  Administered 2011-05-14 – 2011-05-16 (×2): 1.25 mg via ORAL
  Filled 2011-05-14: qty 1

## 2011-05-14 MED ORDER — MORPHINE SULFATE 2 MG/ML IJ SOLN
2.0000 mg | INTRAMUSCULAR | Status: DC | PRN
  Administered 2011-05-15 – 2011-05-16 (×3): 2 mg via INTRAVENOUS
  Filled 2011-05-14 (×3): qty 1

## 2011-05-14 MED ORDER — METOPROLOL SUCCINATE ER 50 MG PO TB24
50.0000 mg | ORAL_TABLET | Freq: Every day | ORAL | Status: DC
  Administered 2011-05-15 – 2011-05-18 (×4): 50 mg via ORAL
  Filled 2011-05-14 (×5): qty 1

## 2011-05-14 MED ORDER — MORPHINE SULFATE 10 MG/ML IJ SOLN
8.0000 mg | Freq: Once | INTRAMUSCULAR | Status: AC
  Administered 2011-05-14: 8 mg via INTRAMUSCULAR
  Filled 2011-05-14: qty 1

## 2011-05-14 MED ORDER — SIMVASTATIN 20 MG PO TABS
40.0000 mg | ORAL_TABLET | Freq: Every day | ORAL | Status: DC
  Administered 2011-05-14: 40 mg via ORAL
  Filled 2011-05-14: qty 2

## 2011-05-14 MED ORDER — ONDANSETRON HCL 4 MG PO TABS
4.0000 mg | ORAL_TABLET | Freq: Four times a day (QID) | ORAL | Status: DC | PRN
  Administered 2011-05-14 – 2011-05-15 (×2): 4 mg via ORAL
  Filled 2011-05-14 (×2): qty 1

## 2011-05-14 MED ORDER — SENNOSIDES-DOCUSATE SODIUM 8.6-50 MG PO TABS: 1.0000 | ORAL_TABLET | Freq: Every day | ORAL | Status: DC | PRN

## 2011-05-14 MED ORDER — ZOLPIDEM TARTRATE 5 MG PO TABS: 5.0000 mg | ORAL_TABLET | Freq: Every evening | ORAL | Status: DC | PRN

## 2011-05-14 MED ORDER — ENOXAPARIN SODIUM 40 MG/0.4ML ~~LOC~~ SOLN
40.0000 mg | SUBCUTANEOUS | Status: DC
  Administered 2011-05-14 – 2011-05-17 (×4): 40 mg via SUBCUTANEOUS
  Filled 2011-05-14 (×4): qty 0.4

## 2011-05-14 MED ORDER — ESTRADIOL 0.1 MG/GM VA CREA
2.0000 g | TOPICAL_CREAM | Freq: Every day | VAGINAL | Status: DC | PRN
  Filled 2011-05-14: qty 42.5

## 2011-05-14 MED ORDER — ACETAMINOPHEN 650 MG RE SUPP: 650.0000 mg | Freq: Four times a day (QID) | RECTAL | Status: DC | PRN

## 2011-05-14 MED ORDER — BIMATOPROST 0.03 % OP SOLN
1.0000 [drp] | Freq: Every day | OPHTHALMIC | Status: DC
  Administered 2011-05-14 – 2011-05-17 (×4): 1 [drp] via OPHTHALMIC
  Filled 2011-05-14: qty 2.5

## 2011-05-14 MED ORDER — LEVOTHYROXINE SODIUM 100 MCG PO TABS
100.0000 ug | ORAL_TABLET | Freq: Every day | ORAL | Status: DC
  Administered 2011-05-15 – 2011-05-18 (×4): 100 ug via ORAL
  Filled 2011-05-14 (×5): qty 1

## 2011-05-14 MED ORDER — BIMATOPROST 0.03 % OP SOLN
OPHTHALMIC | Status: AC
  Filled 2011-05-14: qty 2.5

## 2011-05-14 MED ORDER — DORZOLAMIDE HCL-TIMOLOL MAL 2-0.5 % OP SOLN
1.0000 [drp] | Freq: Two times a day (BID) | OPHTHALMIC | Status: DC
  Administered 2011-05-14 – 2011-05-18 (×8): 1 [drp] via OPHTHALMIC
  Filled 2011-05-14: qty 10

## 2011-05-14 MED ORDER — ONDANSETRON HCL 4 MG/2ML IJ SOLN: 4.0000 mg | Freq: Four times a day (QID) | INTRAMUSCULAR | Status: DC | PRN

## 2011-05-14 MED ORDER — BRIMONIDINE TARTRATE 0.15 % OP SOLN
1.0000 [drp] | Freq: Three times a day (TID) | OPHTHALMIC | Status: DC
  Administered 2011-05-14 – 2011-05-18 (×9): 1 [drp] via OPHTHALMIC
  Filled 2011-05-14: qty 5

## 2011-05-14 MED ORDER — WARFARIN SODIUM 2.5 MG PO TABS: 1.2500 mg | ORAL_TABLET | Freq: Every day | ORAL | Status: DC

## 2011-05-14 NOTE — H&P (Signed)
[name] MRN: [id number] DOB/AGE: February 28, 1927 77 y.o. Primary Care Physician:FUSCO,LAWRENCE J., MD Admit date: 05/14/2011 Chief Complaint: Low back and right leg pain. HPI: This 77 year old lady has the above complaints for the last 4 days. It has become much severe in the last 2 days. She went to see her orthopedic surgeon, Dr. Romeo Apple, who increased her opioid medications but despite this the pain has gotten worse. The pain is in the lower back and radiates down her right leg. It is so painful that she is barely able to walk with a walker. She thinks that also there is pain radiating down the left leg but not to the same degree. She does have a history of osteoarthritis/degenerative joint disease affecting the back as well as osteoporosis. She  has had T12 vertebroplasty in the past. According to her husband, her low back pain problems started after right hip replacement approximately 6 years ago.    Past medical history: 1. Hypertension. 2. Hypothyroidism. 3. Paroxysmal atrial flutter/fibrillation status post cardioversions. 4. Sick sinus syndrome, status post permanent pacemaker. 5. Coronary artery disease with history of PCI. 6. Osteoporosis. 7. Osteoarthritis.  Past surgical history: 1. Right total hip replacement. 2. Appendectomy. 3. Insertion of permanent pacemaker.      Family history: Noncontributory. Social History:  Social history: She has been married for 34 years and lives with her husband. She does not smoke cigarettes and does not drink alcohol.  Allergies: No Known Allergies  Medications Prior to Admission  Medication Dose Route Frequency Provider Last Rate Last Dose  . morphine injection 8 mg  8 mg Intramuscular Once Carleene Cooper III, MD   8 mg at 05/14/11 1200  . ondansetron (ZOFRAN) injection 4 mg  4 mg Intramuscular Once Carleene Cooper III, MD   4 mg at 05/14/11 1201   Medications Prior to Admission  Medication Sig Dispense Refill  . ALPHAGAN P 0.1 %  SOLN 1 drop every 8 (eight) hours.       Marland Kitchen amiodarone (PACERONE) 200 MG tablet       . dorzolamide-timolol (COSOPT) 22.3-6.8 MG/ML ophthalmic solution Place 1 drop into both eyes 2 (two) times daily.       Marland Kitchen ESTRACE VAGINAL 0.1 MG/GM vaginal cream Place 2 g vaginally daily as needed.       Marland Kitchen HYDROcodone-acetaminophen (NORCO) 7.5-325 MG per tablet Take 1 tablet by mouth every 6 (six) hours as needed for pain.  90 tablet  5  . HYDROcodone-acetaminophen (VICODIN) 5-500 MG per tablet Take 1 tablet by mouth every 4 (four) hours as needed for pain.  90 tablet  1  . lidocaine (LIDODERM) 5 % Place 1 patch onto the skin daily. Remove & Discard patch within 12 hours or as directed by MD       . LUMIGAN 0.03 % ophthalmic solution Place 1 drop into both eyes at bedtime.       . metoprolol (TOPROL-XL) 50 MG 24 hr tablet 50 mg.       . pravastatin (PRAVACHOL) 20 MG tablet       . SYNTHROID 100 MCG tablet Take 100 mcg by mouth daily.       Marland Kitchen trimethoprim (TRIMPEX) 100 MG tablet       . warfarin (COUMADIN) 2.5 MG tablet Take 1.25-2.5 mg by mouth daily. Takes half of tablet on Wed      . amLODipine (NORVASC) 5 MG tablet            NUU:VOZDG from the symptoms  mentioned above,there are no other symptoms referable to all systems reviewed.  Physical Exam: Blood pressure 104/44, pulse 65, temperature 98.7 F (37.1 C), temperature source Oral, resp. rate 20, height 5\' 3"  (1.6 m), weight 66.679 kg (147 lb), SpO2 96.00%. She is in pain at rest. Minimal movements caused severe pain radiating down her right leg. Straight leg raising on the right side produces excruciating pain in the right leg and lower right back. Sciatic stretch test is positive. There are no obvious focal neurological signs apart from these mentioned above. Cardiovascular: Heart sounds are present and normal without murmurs. Lung fields are clear. Abdomen is soft and nontender with no evidence of hepatosplenomegaly or any masses. She is alert and  orientated.      Dg Lumbar Spine Complete  05/14/2011  *RADIOLOGY REPORT*  Clinical Data: Low back right hip pain, 3-4 days duration, past history of right hip replacement in kyphoplasty  LUMBAR SPINE - COMPLETE 4+ VIEW  Comparison: 04/08/2005 Correlation:  CT lumbar spine 07/01/2006  Findings: Six non-rib bearing lumbar vertebrae. Prior vertebroplasty at the first non-rib bearing segment, grossly stable. This has been labeled as T12 on the prior CT and the same numbering system will be utilized for these radiographs.  Severe osseous demineralization limits exam. Biconvex thoracolumbar scoliosis. Multilevel disc space narrowing endplate spur formation. Marked compression deformity of L3 and superior endplate of L4, progressive since prior CT. Mild focal kyphosis of the thoracolumbar spine at T12. Mild chronic height loss at L2 appears stable. L1 and L5 heights appear maintained. No subluxation or spondylolysis. Facet degenerative changes lower lumbar spine. Extensive atherosclerotic calcification. Visualized pelvis appears intact.  IMPRESSION: Lumbar numbering as above. Marked osseous demineralization. Prior vertebroplasty T12. Age indeterminate compression fractures of L3 and superior endplate L4, though new since 2007. Multilevel degenerative disc and facet disease changes.  Per CMS PQRS reporting requirements (PQRS Measure 24): Given the patient's age of greater than 50 and the fracture site (hip, distal radius, or spine), the patient should be tested for osteoporosis using DXA, and the appropriate treatment considered based on the DXA results.  Original Report Authenticated By: Lollie Marrow, M.D.   Dg Hip Complete Right  05/14/2011  *RADIOLOGY REPORT*  Clinical Data: Right leg pain for 3-4 days, history of prior right hip replacement and kyphoplasty  RIGHT HIP - COMPLETE 2+ VIEW  Comparison: 04/26/2004  Findings: Right hip replacement with intact hardware. Minimal periprosthetic lucency at the proximal  portion of the femoral shaft component on the frog-leg lateral view. No acute fracture, dislocation or bone destruction. Pelvis appears intact. Scattered atherosclerotic calcifications and pelvic phleboliths.  IMPRESSION: Minimal periprosthetic lucency at the proximal portion of the femoral component of the right hip prosthesis, nonspecific. Significant osseous demineralization. No additional bony abnormalities identified.  Original Report Authenticated By: Lollie Marrow, M.D.   US Renal  05/01/2011  *RADIOLOGY REPORT*  Clinical Data: Chronic cystitis, hypertension, recurrent UTIs  RENAL/URINARY TRACT ULTRASOUND COMPLETE  Comparison:  Abdominal ultrasound 05/29/2005 Correlation:  CT abdomen and pelvis 06/11/2006  Findings:  Right Kidney:  9.0 cm length.  Focal cortical scarring at upper pole of right kidney, unchanged since prior CT.  Normal cortical thickness and echogenicity otherwise seen.  No hydronephrosis or shadowing calcification.  Septated cyst identified at inferior pole, 1.5 x 1.5 x 1.5 cm, measured 1.8 x 1.3 cm on prior CT and 1.7 x 1.6 x 1.6 cm on prior ultrasound.  No additional masses.  Left Kidney:  10.9 cm length.  Normal cortical thickness.  Upper normal cortical echogenicity.  No mass, hydronephrosis or shadowing calcification.  The left kidney is less well visualized due to body habitus.  Bladder:  Only partially distended by urine, grossly normal appearance. Ureteral jets were not visualized.  IMPRESSION: Cortical scar upper pole of right kidney. Septated cyst inferior pole right kidney, not significantly changed in size versus previous exams.  Original Report Authenticated By: Lollie Marrow, M.D.   Impression: 1. Acute sciatica/low back pain. 2. Osteoporosis. 3. Degenerative joint disease of the spine. 4. Paroxysmal atrial fibrillation/flutter. 5. Hypertension. 6. Hypothyroidism.     Plan: 1. Admit to medical floor. 2. Intravenous analgesia as required. 3. CT scan of the  lumbar sacral spine. 4. Consider possible vertebroplasty/kyphoplasty depending on CT scan results.      Primus Gritton C 05/14/2011, 2:39 PM

## 2011-05-14 NOTE — ED Provider Notes (Addendum)
History   Chart scribed for Carleene Cooper III, MD by Enos Fling; the patient was seen in room APA02/APA02; this patient's care was started at 10:40 AM.    CSN: [id number] Arrival date & time: 05/14/2011 10:36 AM  Chief Complaint  Patient presents with  . Hip Pain   HPI [name] is a 77 y.o. female who presents to the Emergency Department complaining of back pain. Pt c/o right low back/hip pain radiating down right leg worsening x 3 days, pain is worse with walking and movement of RLE. Pt has norco and vicodin for pain which she has been taking with mild relief. No recent fall or injury. Pt h/o right hip replacement. No numbness or tingling. No bowel or bladder incontinence.   Past Medical History  Diagnosis Date  . Hypertension   . Arrhythmia   . High cholesterol     Past Surgical History  Procedure Date  . Total hip arthroplasty     right side  . Pacemaker insertion   Vertebroplasty  No family history on file.  History  Substance Use Topics  . Smoking status: Never Smoker   . Smokeless tobacco: Not on file  . Alcohol Use: No    OB History    Grav Para Term Preterm Abortions TAB SAB Ect Mult Living                 Previous Medications   ALPHAGAN P 0.1 % SOLN    1 drop every 8 (eight) hours.    AMIODARONE (PACERONE) 200 MG TABLET       AMLODIPINE (NORVASC) 5 MG TABLET       DORZOLAMIDE-TIMOLOL (COSOPT) 22.3-6.8 MG/ML OPHTHALMIC SOLUTION    Place 1 drop into both eyes 2 (two) times daily.    ESTRACE VAGINAL 0.1 MG/GM VAGINAL CREAM    Place 2 g vaginally daily as needed.    HYDROCODONE-ACETAMINOPHEN (NORCO) 7.5-325 MG PER TABLET    Take 1 tablet by mouth every 6 (six) hours as needed for pain.   HYDROCODONE-ACETAMINOPHEN (VICODIN) 5-500 MG PER TABLET    Take 1 tablet by mouth every 4 (four) hours as needed for pain.   LIDOCAINE (LIDODERM) 5 %    Place 1 patch onto the skin daily. Remove & Discard patch within 12 hours or as directed by MD    LUMIGAN 0.03 %  OPHTHALMIC SOLUTION    Place 1 drop into both eyes at bedtime.    METOPROLOL (TOPROL-XL) 50 MG 24 HR TABLET    50 mg.    PRAVASTATIN (PRAVACHOL) 20 MG TABLET       SYNTHROID 100 MCG TABLET    Take 100 mcg by mouth daily.    TRIMETHOPRIM (TRIMPEX) 100 MG TABLET       WARFARIN (COUMADIN) 2.5 MG TABLET    Take 1.25-2.5 mg by mouth daily. Takes half of tablet on Wed     Allergies as of 05/14/2011  . (No Known Allergies)     Review of Systems  Constitutional: Negative for fever and unexpected weight change.  HENT: Negative for ear pain and sore throat.   Eyes: Negative for visual disturbance.  Respiratory: Negative for cough and shortness of breath.   Cardiovascular: Negative for chest pain.  Gastrointestinal: Negative for vomiting and diarrhea.  Genitourinary: Negative.   Musculoskeletal: Positive for back pain. Negative for myalgias.  Skin: Negative for rash.  Neurological: Negative for dizziness, seizures and syncope.  Hematological: Negative.   Psychiatric/Behavioral: Negative.  Physical Exam  BP 104/44  Pulse 65  Temp(Src) 98.7 F (37.1 C) (Oral)  Resp 20  Ht 5\' 3"  (1.6 m)  Wt 147 lb (66.679 kg)  BMI 26.04 kg/m2  SpO2 96%  Physical Exam  Nursing note and vitals reviewed. Constitutional: She is oriented to person, place, and time. She appears well-developed and well-nourished. No distress.  HENT:  Head: Normocephalic.  Mouth/Throat: Mucous membranes are normal.  Eyes: Conjunctivae are normal.  Neck: Normal range of motion. Neck supple.  Cardiovascular: Normal rate, regular rhythm and intact distal pulses.  Exam reveals no gallop and no friction rub.   No murmur heard. Pulmonary/Chest: Effort normal and breath sounds normal. She has no wheezes. She has no rales.  Abdominal: Soft. There is no tenderness.  Musculoskeletal: Normal range of motion. She exhibits tenderness (over sacroiliac joint).       FROM right knee with pain  Neurological: She is alert and oriented  to person, place, and time.       Sensation and tendons intact BLE  Skin: Skin is warm and dry. No rash noted.  Psychiatric: She has a normal mood and affect.    Procedures  OTHER DATA REVIEWED: Nursing notes and vital signs reviewed. Prior records reviewed.   LABS / RADIOLOGY: Dg Lumbar Spine Complete  05/14/2011  *RADIOLOGY REPORT*  Clinical Data: Low back right hip pain, 3-4 days duration, past history of right hip replacement in kyphoplasty  LUMBAR SPINE - COMPLETE 4+ VIEW  Comparison: 04/08/2005 Correlation:  CT lumbar spine 07/01/2006  Findings: Six non-rib bearing lumbar vertebrae. Prior vertebroplasty at the first non-rib bearing segment, grossly stable. This has been labeled as T12 on the prior CT and the same numbering system will be utilized for these radiographs.  Severe osseous demineralization limits exam. Biconvex thoracolumbar scoliosis. Multilevel disc space narrowing endplate spur formation. Marked compression deformity of L3 and superior endplate of L4, progressive since prior CT. Mild focal kyphosis of the thoracolumbar spine at T12. Mild chronic height loss at L2 appears stable. L1 and L5 heights appear maintained. No subluxation or spondylolysis. Facet degenerative changes lower lumbar spine. Extensive atherosclerotic calcification. Visualized pelvis appears intact.  IMPRESSION: Lumbar numbering as above. Marked osseous demineralization. Prior vertebroplasty T12. Age indeterminate compression fractures of L3 and superior endplate L4, though new since 2007. Multilevel degenerative disc and facet disease changes.  Per CMS PQRS reporting requirements (PQRS Measure 24): Given the patient's age of greater than 50 and the fracture site (hip, distal radius, or spine), the patient should be tested for osteoporosis using DXA, and the appropriate treatment considered based on the DXA results.  Original Report Authenticated By: Lollie Marrow, M.D.   Dg Hip Complete Right  05/14/2011   *RADIOLOGY REPORT*  Clinical Data: Right leg pain for 3-4 days, history of prior right hip replacement and kyphoplasty  RIGHT HIP - COMPLETE 2+ VIEW  Comparison: 04/26/2004  Findings: Right hip replacement with intact hardware. Minimal periprosthetic lucency at the proximal portion of the femoral shaft component on the frog-leg lateral view. No acute fracture, dislocation or bone destruction. Pelvis appears intact. Scattered atherosclerotic calcifications and pelvic phleboliths.  IMPRESSION: Minimal periprosthetic lucency at the proximal portion of the femoral component of the right hip prosthesis, nonspecific. Significant osseous demineralization. No additional bony abnormalities identified.  Original Report Authenticated By: Lollie Marrow, M.D.    Date: 05/14/2011  Rate: 65  Rhythm: Electronically paced..  QRS Axis: left  Intervals: normal  ST/T Wave abnormalities: normal  Conduction Disutrbances:none  Narrative Interpretation: Electronically paced EKG  Old EKG Reviewed: none available     ED COURSE:  12:47 PM - Results discussed with pt and family. Will consult with Dr. Romeo Apple. 1:37 PM - Case discussed with Dr. Romeo Apple, recommends d/c home with outpatient f/u. 1:55 PM - Pt requesting admission, states it is difficult to take care of herself at home d/t pain. 2:59 PM Case discussed with Dr. Karilyn Cota, [facility], who saw and admitted her.    IMPRESSION: 1. Pain   Osteoporosis Compression fractures of the lumbar spine.   PLAN: All results reviewed and discussed with pt, questions answered, pt agreeable with plan.   CONDITION ON DISCHARGE: Stable   MEDS GIVEN IN ED:  Medications  morphine injection 8 mg (8 mg Intramuscular Given 05/14/11 1200)  ondansetron (ZOFRAN) injection 4 mg (4 mg Intramuscular Given 05/14/11 1201)      SCRIBE ATTESTATION: I personally performed the services described in this documentation, which was scribed in my presence. The recorded  information has been reviewed and considered. Nimish C Koren Bound III, MD 05/14/11 1500  Carleene Cooper III, MD 05/14/11 [phone]

## 2011-05-14 NOTE — ED Notes (Signed)
Regular diet ordered. NAD. Pt comfortable. Talking with family members at bedside. Ortho has refused to admit pt at this time. EMD to speak with hospitalist.

## 2011-05-14 NOTE — ED Notes (Signed)
C/o increased pain to right hip shooting down right leg x 3 days.  Denies injury/fall.

## 2011-05-14 NOTE — ED Notes (Signed)
Report given to Tamika . Pt to floor.

## 2011-05-15 DIAGNOSIS — M48061 Spinal stenosis, lumbar region without neurogenic claudication: Secondary | ICD-10-CM

## 2011-05-15 LAB — CBC
MCH: 30.1 pg (ref 26.0–34.0)
MCV: 92.1 fL (ref 78.0–100.0)
Platelets: 222 10*3/uL (ref 150–400)
RBC: 4.05 MIL/uL (ref 3.87–5.11)
RDW: 15.2 % (ref 11.5–15.5)
WBC: 7.4 10*3/uL (ref 4.0–10.5)

## 2011-05-15 LAB — COMPREHENSIVE METABOLIC PANEL
Alkaline Phosphatase: 68 U/L (ref 39–117)
BUN: 14 mg/dL (ref 6–23)
CO2: 27 mEq/L (ref 19–32)
Chloride: 103 mEq/L (ref 96–112)
Creatinine, Ser: 0.72 mg/dL (ref 0.50–1.10)
GFR calc non Af Amer: >60 mL/min (ref >60)
Potassium: 4.5 mEq/L (ref 3.5–5.1)
Total Bilirubin: 0.6 mg/dL (ref 0.3–1.2)

## 2011-05-15 LAB — URINE CULTURE
Colony Count: NO GROWTH
Culture: NO GROWTH

## 2011-05-15 LAB — PROTIME-INR: Prothrombin Time: 20 seconds — ABNORMAL HIGH (ref 11.6–15.2)

## 2011-05-15 MED ORDER — SODIUM CHLORIDE 0.9 % IV SOLN
INTRAVENOUS | Status: DC
  Administered 2011-05-15: 13:00:00 via INTRAVENOUS

## 2011-05-15 MED ORDER — METHYLPREDNISOLONE SODIUM SUCC 40 MG IJ SOLR
40.0000 mg | Freq: Three times a day (TID) | INTRAMUSCULAR | Status: DC
  Administered 2011-05-15 – 2011-05-18 (×9): 40 mg via INTRAVENOUS
  Filled 2011-05-15 (×9): qty 1

## 2011-05-15 MED ORDER — GABAPENTIN 600 MG PO TABS
300.0000 mg | ORAL_TABLET | Freq: Three times a day (TID) | ORAL | Status: DC
  Filled 2011-05-15 (×3): qty 1

## 2011-05-15 MED ORDER — ROSUVASTATIN CALCIUM 5 MG PO TABS
10.0000 mg | ORAL_TABLET | Freq: Every day | ORAL | Status: DC
  Administered 2011-05-15 – 2011-05-17 (×3): 10 mg via ORAL
  Filled 2011-05-15 (×3): qty 2

## 2011-05-15 MED ORDER — GABAPENTIN 300 MG PO CAPS
300.0000 mg | ORAL_CAPSULE | Freq: Three times a day (TID) | ORAL | Status: DC
  Administered 2011-05-15 – 2011-05-18 (×8): 300 mg via ORAL
  Filled 2011-05-15 (×8): qty 1

## 2011-05-15 MED ORDER — SODIUM CHLORIDE 0.9 % IJ SOLN
INTRAMUSCULAR | Status: AC
  Administered 2011-05-15: 14:00:00
  Filled 2011-05-15: qty 10

## 2011-05-15 NOTE — Progress Notes (Signed)
Physical Therapy Evaluation Patient Name: [name] [id number] Date: 05/15/2011 Problem List:  Patient Active Problem List  Diagnoses  . Chronic pain syndrome  . ARTHRITIS, LUMBOSACRAL SPINE  . Sciatica  . PAF (paroxysmal atrial fibrillation)  . Sick sinus syndrome  . HTN (hypertension)  . Hypothyroidism  . Osteoporosis   Past Medical History:  Past Medical History  Diagnosis Date  . Hypertension   . Arrhythmia   . High cholesterol    Past Surgical History:  Past Surgical History  Procedure Date  . Total hip arthroplasty     right side  . Pacemaker insertion     Precautions/Restrictions  Precautions Required Braces or Orthoses: No Restrictions Weight Bearing Restrictions: No Prior Functioning  Home Living Type of Home: House Lives With: Spouse Receives Help From: Family Home Layout: One level Home Access: Stairs to enter Entrance Stairs-Rails: Right Entrance Stairs-Number of Steps: 5 Bathroom Shower/Tub: Tub/shower unit Prior Function Level of Independence: Independent with basic ADLs Driving: Yes Vocation: Retired Producer, television/film/video: Awake/alert Overall Cognitive Status: Appears within functional limits for tasks assessed Orientation Level: Oriented X4 Sensation/Coordination Sensation Light Touch: Appears Intact Coordination Gross Motor Movements are Fluid and Coordinated: Not tested Fine Motor Movements are Fluid and Coordinated: Not tested Extremity Assessment   Mobility (including Balance) Bed Mobility Bed Mobility: Yes Rolling Left: 3: Mod assist Left Sidelying to Sit: 3: Mod assist Sitting - Scoot to Edge of Bed: 4: Min assist Sit to Supine - Left: 3: Mod assist Transfers Transfers: Yes Sit to Stand: 5: Supervision Stand Pivot Transfers: 5: Supervision Ambulation/Gait Ambulation/Gait: Yes Ambulation/Gait Assistance: 5: Supervision Ambulation Distance (Feet): 10 Feet Assistive device: Rolling walker Gait Pattern:  Decreased step length - right;Decreased step length - left Gait velocity: slow Stairs: No Wheelchair Mobility Wheelchair Mobility: No  Balance Balance Assessed: No Exercise  Other Exercises Other Exercises: ab set; glut set; hip adductor isometric x 10  End of Session PT - End of Session Equipment Utilized During Treatment: Gait belt (rollin walker) Activity Tolerance: Patient tolerated treatment well Patient left: in chair;with bed alarm set;with call bell in reach General Behavior During Session: [facility] for tasks performed Cognition: [facility] for tasks performed PT Assessment/Plan/Recommendation PT Assessment Clinical Impression Statement: Pt with increased back pain since 05/10/11 which is interfering with her I in mobility.  Pt will benefit from skilled PT to increase I in mobility. PT Recommendation/Assessment: Patient will need skilled PT in the acute care venue PT Problem List: Decreased strength;Decreased activity tolerance;Pain PT Therapy Diagnosis : Difficulty walking;Acute pain;Generalized weakness PT Plan PT Frequency: Min 5X/week PT Treatment/Interventions: Gait training;Functional mobility training;Therapeutic exercise PT Recommendation Follow Up Recommendations: Skilled nursing facility Equipment Recommended: Rolling walker with 5" wheels PT Goals  Acute Rehab PT Goals PT Goal Formulation: With patient Time For Goal Achievement: 3 days Pt will Roll Supine to Right Side: with modified independence Pt will Roll Supine to Left Side: with min assist Pt will go Supine/Side to Sit: with min assist Pt will go Sit to Supine/Side: with min assist Pt will Ambulate: 16 - 50 feet Pt will Go Up / Down Stairs: 3-5 stairs (at next venue.) RUSSELL,CINDY 05/15/2011, 11:45 AM

## 2011-05-15 NOTE — Patient Instructions (Signed)
For exercise and log rolling for bed mobility

## 2011-05-15 NOTE — Progress Notes (Signed)
Subjective: This lady is somewhat more improved with analgesics and now she is receiving physical therapy. She did receive a CT scan of her lumbar spine yesterday, the results are shown below. It is not clear whether there are new findings from this report or not.           Physical Exam: Blood pressure 127/74, pulse 65, temperature 98.4 F (36.9 C), temperature source Oral, resp. rate 20, height 5\' 3"  (1.6 m), weight 66.679 kg (147 lb), SpO2 93.00%. She looks systemically well. Heart sounds are present and normal. Lung fields are clear. There are no new physical findings apart from the ones seen on my initial history and physical examination. In particular, she still appears to have sciatica.   Investigations:  Basename 05/15/11 0543 05/14/11 1407  WBC 7.4 8.3  NEUTROABS -- 5.8  HGB 12.2 12.6  HCT 37.3 38.9  MCV 92.1 92.4  PLT 222 237    Basename 05/15/11 0543 05/14/11 1407  NA 136 138  K 4.5 4.3  CL 103 104  CO2 27 26  GLUCOSE 105* 109*  BUN 14 15  CREATININE 0.72 0.84  CALCIUM 9.6 9.5  MG -- --  PHOS -- --   No results found for this or any previous visit (from the past 240 hour(s)).  Dg Lumbar Spine Complete  05/14/2011  *RADIOLOGY REPORT*  Clinical Data: Low back right hip pain, 3-4 days duration, past history of right hip replacement in kyphoplasty  LUMBAR SPINE - COMPLETE 4+ VIEW  Comparison: 04/08/2005 Correlation:  CT lumbar spine 07/01/2006  Findings: Six non-rib bearing lumbar vertebrae. Prior vertebroplasty at the first non-rib bearing segment, grossly stable. This has been labeled as T12 on the prior CT and the same numbering system will be utilized for these radiographs.  Severe osseous demineralization limits exam. Biconvex thoracolumbar scoliosis. Multilevel disc space narrowing endplate spur formation. Marked compression deformity of L3 and superior endplate of L4, progressive since prior CT. Mild focal kyphosis of the thoracolumbar spine at T12. Mild  chronic height loss at L2 appears stable. L1 and L5 heights appear maintained. No subluxation or spondylolysis. Facet degenerative changes lower lumbar spine. Extensive atherosclerotic calcification. Visualized pelvis appears intact.  IMPRESSION: Lumbar numbering as above. Marked osseous demineralization. Prior vertebroplasty T12. Age indeterminate compression fractures of L3 and superior endplate L4, though new since 2007. Multilevel degenerative disc and facet disease changes.  Per CMS PQRS reporting requirements (PQRS Measure 24): Given the patient's age of greater than 50 and the fracture site (hip, distal radius, or spine), the patient should be tested for osteoporosis using DXA, and the appropriate treatment considered based on the DXA results.  Original Report Authenticated By: Lollie Marrow, M.D.   Dg Hip Complete Right  05/14/2011  *RADIOLOGY REPORT*  Clinical Data: Right leg pain for 3-4 days, history of prior right hip replacement and kyphoplasty  RIGHT HIP - COMPLETE 2+ VIEW  Comparison: 04/26/2004  Findings: Right hip replacement with intact hardware. Minimal periprosthetic lucency at the proximal portion of the femoral shaft component on the frog-leg lateral view. No acute fracture, dislocation or bone destruction. Pelvis appears intact. Scattered atherosclerotic calcifications and pelvic phleboliths.  IMPRESSION: Minimal periprosthetic lucency at the proximal portion of the femoral component of the right hip prosthesis, nonspecific. Significant osseous demineralization. No additional bony abnormalities identified.  Original Report Authenticated By: Lollie Marrow, M.D.   Ct Lumbar Spine Wo Contrast  05/14/2011  *RADIOLOGY REPORT*  Clinical Data: Continued severe back pain.  History  of kyphoplasty. No recent injury.  CT LUMBAR SPINE WITHOUT CONTRAST  Technique:  Multidetector CT imaging of the lumbar spine was performed without intravenous contrast administration. Multiplanar CT image  reconstructions were also generated.  Comparison: Lumbar spine radiographs 05/14/2011 and lumbar spine CT 07/01/2006.  Findings: There is a slightly progressive convex right scoliosis. The lateral alignment is stable.  There are stable vertebroplasty changes at T12.  Chronic fractures at T12 and L2 are unchanged.  As demonstrated on recent radiographs, there is a progressive superior endplate compression fracture at L3, now resulting in approximately 50% loss of vertebral body height.  There is mild osseous retropulsion. No paraspinal hematoma or discrete cortical fracture is identified.  There is vacuum phenomenon within the L2- L3 disc which is similar to the prior study.  Superior endplate compression fracture at L4 does not appear significantly changed.  However, there is new cortical irregularity of the inferior end plate laterally on the right, most obvious on the coronal images.  The L5 vertebral body appears intact.  Spondylosis is similar to prior studies.  There is stable spinal stenosis at all 02-03 with lateral recess stenosis bilaterally.  At L3-L4, there is narrowing of the right lateral recess and right foramen.  At L4-L5, there is mild to moderate central stenosis with narrowing of both foramina.  IMPRESSION:  1.  Since the prior CT of 07/01/2006, there is a progressive superior endplate compression deformity at L3 with mild osseous retropulsion.  This does not demonstrate any definite acute features or associated paraspinal hemorrhage. 2.  Possible acute fracture involving the inferior endplate of L4 on the right. 3.  Stable T12 and L2 compression deformity status post T12 vertebroplasty. 4.  Stable multilevel spondylosis and resulting spinal stenosis.  Original Report Authenticated By: Gerrianne Scale, M.D.      Medications: I have reviewed the patient's current medications.  Impression: 1. Acute sciatica/low back pain. 2. Osteoporosis. 3. Degenerative joint disease of the spine. 4.  Paroxysmal atrial fibrillation/flutter. This is stable. 5. Hypertension, stable. 6. Hypothyroidism, stable. 7. Sick sinus syndrome, status post pacemaker.     Plan: 1. Continue analgesics. 2. Orthopedic consultation with Dr. Fuller Canada who knows this patient in the outpatient setting. He will see her today.     LOS: 1 day   Bibiana Gillean C 05/15/2011, 11:34 AM

## 2011-05-15 NOTE — Consult Note (Signed)
Reason for Consult:back pain  Referring Physician: Dr Renato Battles is an 77 y.o. female.  HPI: chronic pain and spinal stenosis, presents with gait disturbance and increasing pain  CT no acute fracture. Positive for spinal stenosis.   Past Medical History  Diagnosis Date  . Hypertension   . Arrhythmia   . High cholesterol     Past Surgical History  Procedure Date  . Total hip arthroplasty     right side  . Pacemaker insertion     No family history on file.  Social History:  reports that she has never smoked. She does not have any smokeless tobacco history on file. She reports that she does not drink alcohol or use illicit drugs.  Allergies: No Known Allergies  Medications: I have reviewed the patient's current medications.  Results for orders placed during the hospital encounter of 05/14/11 (from the past 48 hour(s))  CBC     Status: Normal   Collection Time   05/14/11  2:07 PM      Component Value Range Comment   WBC 8.3  4.0 - 10.5 (K/uL)    RBC 4.21  3.87 - 5.11 (MIL/uL)    Hemoglobin 12.6  12.0 - 15.0 (g/dL)    HCT 84.6  96.2 - 95.2 (%)    MCV 92.4  78.0 - 100.0 (fL)    MCH 29.9  26.0 - 34.0 (pg)    MCHC 32.4  30.0 - 36.0 (g/dL)    RDW 84.1  32.4 - 40.1 (%)    Platelets 237  150 - 400 (K/uL)   DIFFERENTIAL     Status: Normal   Collection Time   05/14/11  2:07 PM      Component Value Range Comment   Neutrophils Relative 70  43 - 77 (%)    Neutro Abs 5.8  1.7 - 7.7 (K/uL)    Lymphocytes Relative 17  12 - 46 (%)    Lymphs Abs 1.4  0.7 - 4.0 (K/uL)    Monocytes Relative 12  3 - 12 (%)    Monocytes Absolute 1.0  0.1 - 1.0 (K/uL)    Eosinophils Relative 1  0 - 5 (%)    Eosinophils Absolute 0.1  0.0 - 0.7 (K/uL)    Basophils Relative 0  0 - 1 (%)    Basophils Absolute 0.0  0.0 - 0.1 (K/uL)   BASIC METABOLIC PANEL     Status: Abnormal   Collection Time   05/14/11  2:07 PM      Component Value Range Comment   Sodium 138  135 - 145 (mEq/L)    Potassium  4.3  3.5 - 5.1 (mEq/L)    Chloride 104  96 - 112 (mEq/L)    CO2 26  19 - 32 (mEq/L)    Glucose, Bld 109 (*) 70 - 99 (mg/dL)    BUN 15  6 - 23 (mg/dL)    Creatinine, Ser 0.27  0.50 - 1.10 (mg/dL)    Calcium 9.5  8.4 - 10.5 (mg/dL)    GFR calc non Af Amer >60  >60 (mL/min)    GFR calc Af Amer >60  >60 (mL/min)   PROTIME-INR     Status: Abnormal   Collection Time   05/14/11  2:07 PM      Component Value Range Comment   Prothrombin Time 19.2 (*) 11.6 - 15.2 (seconds)    INR 1.58 (*) 0.00 - 1.49    URINALYSIS, ROUTINE W REFLEX MICROSCOPIC  Status: Normal   Collection Time   05/14/11  3:08 PM      Component Value Range Comment   Color, Urine YELLOW  YELLOW     Appearance CLEAR  CLEAR     Specific Gravity, Urine 1.020  1.005 - 1.030     pH 7.0  5.0 - 8.0     Glucose, UA NEGATIVE  NEGATIVE (mg/dL)    Hgb urine dipstick NEGATIVE  NEGATIVE     Bilirubin Urine NEGATIVE  NEGATIVE     Ketones, ur NEGATIVE  NEGATIVE (mg/dL)    Protein, ur NEGATIVE  NEGATIVE (mg/dL)    Urobilinogen, UA 0.2  0.0 - 1.0 (mg/dL)    Nitrite NEGATIVE  NEGATIVE     Leukocytes, UA NEGATIVE  NEGATIVE  MICROSCOPIC NOT DONE ON URINES WITH NEGATIVE PROTEIN, BLOOD, LEUKOCYTES, NITRITE, OR GLUCOSE <1000 mg/dL.  COMPREHENSIVE METABOLIC PANEL     Status: Abnormal   Collection Time   05/15/11  5:43 AM      Component Value Range Comment   Sodium 136  135 - 145 (mEq/L)    Potassium 4.5  3.5 - 5.1 (mEq/L)    Chloride 103  96 - 112 (mEq/L)    CO2 27  19 - 32 (mEq/L)    Glucose, Bld 105 (*) 70 - 99 (mg/dL)    BUN 14  6 - 23 (mg/dL)    Creatinine, Ser 1.61  0.50 - 1.10 (mg/dL)    Calcium 9.6  8.4 - 10.5 (mg/dL)    Total Protein 6.1  6.0 - 8.3 (g/dL)    Albumin 2.9 (*) 3.5 - 5.2 (g/dL)    AST 10  0 - 37 (U/L)    ALT 9  0 - 35 (U/L)    Alkaline Phosphatase 68  39 - 117 (U/L)    Total Bilirubin 0.6  0.3 - 1.2 (mg/dL)    GFR calc non Af Amer >60  >60 (mL/min)    GFR calc Af Amer >60  >60 (mL/min)   CBC     Status: Normal    Collection Time   05/15/11  5:43 AM      Component Value Range Comment   WBC 7.4  4.0 - 10.5 (K/uL)    RBC 4.05  3.87 - 5.11 (MIL/uL)    Hemoglobin 12.2  12.0 - 15.0 (g/dL)    HCT 09.6  04.5 - 40.9 (%)    MCV 92.1  78.0 - 100.0 (fL)    MCH 30.1  26.0 - 34.0 (pg)    MCHC 32.7  30.0 - 36.0 (g/dL)    RDW 81.1  91.4 - 78.2 (%)    Platelets 222  150 - 400 (K/uL)   PROTIME-INR     Status: Abnormal   Collection Time   05/15/11  9:09 AM      Component Value Range Comment   Prothrombin Time 20.0 (*) 11.6 - 15.2 (seconds)    INR 1.67 (*) 0.00 - 1.49      Dg Lumbar Spine Complete  05/14/2011  *RADIOLOGY REPORT*  Clinical Data: Low back right hip pain, 3-4 days duration, past history of right hip replacement in kyphoplasty  LUMBAR SPINE - COMPLETE 4+ VIEW  Comparison: 04/08/2005 Correlation:  CT lumbar spine 07/01/2006  Findings: Six non-rib bearing lumbar vertebrae. Prior vertebroplasty at the first non-rib bearing segment, grossly stable. This has been labeled as T12 on the prior CT and the same numbering system will be utilized for these radiographs.  Severe osseous demineralization  limits exam. Biconvex thoracolumbar scoliosis. Multilevel disc space narrowing endplate spur formation. Marked compression deformity of L3 and superior endplate of L4, progressive since prior CT. Mild focal kyphosis of the thoracolumbar spine at T12. Mild chronic height loss at L2 appears stable. L1 and L5 heights appear maintained. No subluxation or spondylolysis. Facet degenerative changes lower lumbar spine. Extensive atherosclerotic calcification. Visualized pelvis appears intact.  IMPRESSION: Lumbar numbering as above. Marked osseous demineralization. Prior vertebroplasty T12. Age indeterminate compression fractures of L3 and superior endplate L4, though new since 2007. Multilevel degenerative disc and facet disease changes.  Per CMS PQRS reporting requirements (PQRS Measure 24): Given the patient's age of greater than 50  and the fracture site (hip, distal radius, or spine), the patient should be tested for osteoporosis using DXA, and the appropriate treatment considered based on the DXA results.  Original Report Authenticated By: Lollie Marrow, M.D.   Dg Hip Complete Right  05/14/2011  *RADIOLOGY REPORT*  Clinical Data: Right leg pain for 3-4 days, history of prior right hip replacement and kyphoplasty  RIGHT HIP - COMPLETE 2+ VIEW  Comparison: 04/26/2004  Findings: Right hip replacement with intact hardware. Minimal periprosthetic lucency at the proximal portion of the femoral shaft component on the frog-leg lateral view. No acute fracture, dislocation or bone destruction. Pelvis appears intact. Scattered atherosclerotic calcifications and pelvic phleboliths.  IMPRESSION: Minimal periprosthetic lucency at the proximal portion of the femoral component of the right hip prosthesis, nonspecific. Significant osseous demineralization. No additional bony abnormalities identified.  Original Report Authenticated By: Lollie Marrow, M.D.   Ct Lumbar Spine Wo Contrast  05/14/2011  *RADIOLOGY REPORT*  Clinical Data: Continued severe back pain.  History of kyphoplasty. No recent injury.  CT LUMBAR SPINE WITHOUT CONTRAST  Technique:  Multidetector CT imaging of the lumbar spine was performed without intravenous contrast administration. Multiplanar CT image reconstructions were also generated.  Comparison: Lumbar spine radiographs 05/14/2011 and lumbar spine CT 07/01/2006.  Findings: There is a slightly progressive convex right scoliosis. The lateral alignment is stable.  There are stable vertebroplasty changes at T12.  Chronic fractures at T12 and L2 are unchanged.  As demonstrated on recent radiographs, there is a progressive superior endplate compression fracture at L3, now resulting in approximately 50% loss of vertebral body height.  There is mild osseous retropulsion. No paraspinal hematoma or discrete cortical fracture is identified.   There is vacuum phenomenon within the L2- L3 disc which is similar to the prior study.  Superior endplate compression fracture at L4 does not appear significantly changed.  However, there is new cortical irregularity of the inferior end plate laterally on the right, most obvious on the coronal images.  The L5 vertebral body appears intact.  Spondylosis is similar to prior studies.  There is stable spinal stenosis at all 02-03 with lateral recess stenosis bilaterally.  At L3-L4, there is narrowing of the right lateral recess and right foramen.  At L4-L5, there is mild to moderate central stenosis with narrowing of both foramina.  IMPRESSION:  1.  Since the prior CT of 07/01/2006, there is a progressive superior endplate compression deformity at L3 with mild osseous retropulsion.  This does not demonstrate any definite acute features or associated paraspinal hemorrhage. 2.  Possible acute fracture involving the inferior endplate of L4 on the right. 3.  Stable T12 and L2 compression deformity status post T12 vertebroplasty. 4.  Stable multilevel spondylosis and resulting spinal stenosis.  Original Report Authenticated By: Gerrianne Scale, M.D.  Review of Systems  Constitutional: Negative for fever.  Respiratory: Positive for wheezing.   Cardiovascular: Negative for palpitations.  Gastrointestinal: Positive for nausea.  Genitourinary: Negative for dysuria, urgency, frequency and flank pain.  Musculoskeletal: Positive for myalgias, back pain and falls.  Skin: Negative for rash.  Neurological: Negative for tingling, sensory change and loss of consciousness.   Blood pressure 127/72, pulse 65, temperature 97.4 F (36.3 C), temperature source Oral, resp. rate 18, height 5\' 3"  (1.6 m), weight 147 lb (66.679 kg), SpO2 94.00%. Physical Exam  Constitutional: She is oriented to person, place, and time. She appears well-developed and well-nourished.  HENT:  Head: Atraumatic.  Eyes: Conjunctivae are normal.    Neck: No tracheal deviation present.  Cardiovascular: Normal rate.   Respiratory: Effort normal.  Musculoskeletal:       Right hip: Normal.       Left hip: Normal.       Right knee: Normal.       Left knee: Normal.       Right ankle: Normal.       Left ankle: Normal.  Neurological: She is alert and oriented to person, place, and time.  Skin: Skin is warm.  Psychiatric: She has a normal mood and affect.    Assessment/Plan: 1. Since the prior CT of 07/01/2006, there is a progressive  superior endplate compression deformity at L3 with mild osseous  retropulsion. This does not demonstrate any definite acute  features or associated paraspinal hemorrhage.  2. Possible acute fracture involving the inferior endplate of L4  on the right.  3. Stable T12 and L2 compression deformity status post T12  vertebroplasty.  4. Stable multilevel spondylosis and resulting spinal stenosis.  Original Report Authenticated By: Gerrianne Scale, M.D.  Probable acute exacerbation of spinal stenosis with leg pain   IV steroids and gabapentin po   Re check in 24 hrs    Fuller Canada 05/15/2011, 12:36 PM

## 2011-05-15 NOTE — Progress Notes (Signed)
UR Chart Review Completed  

## 2011-05-16 LAB — PROTIME-INR
INR: 1.96 — ABNORMAL HIGH (ref 0.00–1.49)
Prothrombin Time: 22.7 seconds — ABNORMAL HIGH (ref 11.6–15.2)

## 2011-05-16 LAB — COMPREHENSIVE METABOLIC PANEL
Albumin: 3.1 g/dL — ABNORMAL LOW (ref 3.5–5.2)
BUN: 13 mg/dL (ref 6–23)
Calcium: 10 mg/dL (ref 8.4–10.5)
Creatinine, Ser: 0.7 mg/dL (ref 0.50–1.10)
GFR calc Af Amer: >60 mL/min (ref >60)
Glucose, Bld: 178 mg/dL — ABNORMAL HIGH (ref 70–99)
Potassium: 4.2 mEq/L (ref 3.5–5.1)
Total Protein: 6.7 g/dL (ref 6.0–8.3)

## 2011-05-16 LAB — CBC
HCT: 42.2 % (ref 36.0–46.0)
Hemoglobin: 13.6 g/dL (ref 12.0–15.0)
MCH: 29.7 pg (ref 26.0–34.0)
MCHC: 32.2 g/dL (ref 30.0–36.0)
RDW: 14.9 % (ref 11.5–15.5)

## 2011-05-16 MED ORDER — MORPHINE SULFATE 2 MG/ML IJ SOLN
2.0000 mg | INTRAMUSCULAR | Status: DC | PRN
  Administered 2011-05-16: 2 mg via INTRAVENOUS
  Filled 2011-05-16: qty 1

## 2011-05-16 MED ORDER — TRAMADOL HCL 50 MG PO TABS
50.0000 mg | ORAL_TABLET | Freq: Three times a day (TID) | ORAL | Status: DC
  Administered 2011-05-16 – 2011-05-18 (×6): 50 mg via ORAL
  Filled 2011-05-16 (×6): qty 1

## 2011-05-16 MED ORDER — POLYETHYLENE GLYCOL 3350 17 G PO PACK
17.0000 g | PACK | Freq: Every day | ORAL | Status: DC
  Administered 2011-05-16 – 2011-05-18 (×3): 17 g via ORAL
  Filled 2011-05-16 (×3): qty 1

## 2011-05-16 NOTE — Progress Notes (Signed)
Physical Therapy Treatment Patient Name: [name] [id number] Date: 05/16/2011 TIME: [id number] Problem List:  Patient Active Problem List  Diagnoses  . Chronic pain syndrome  . ARTHRITIS, LUMBOSACRAL SPINE  . Sciatica  . PAF (paroxysmal atrial fibrillation)  . Sick sinus syndrome  . HTN (hypertension)  . Hypothyroidism  . Osteoporosis   Past Medical History:  Past Medical History  Diagnosis Date  . Hypertension   . Arrhythmia   . High cholesterol    Past Surgical History:  Past Surgical History  Procedure Date  . Total hip arthroplasty     right side  . Pacemaker insertion    Precautions/Restrictions  Precautions Required Braces or Orthoses: No Restrictions Weight Bearing Restrictions: No Mobility (including Balance) Bed Mobility Rolling Right: 5: Supervision Rolling Right Details (indicate cue type and reason): vc's for hand placement Right Sidelying to Sit: 4: Min assist Right Sidelying to Sit Details (indicate cue type and reason): assistance needed for trunk/LEs;vc's for hand placement Sitting - Scoot to Edge of Bed: 6: Modified independent (Device/Increase time) Transfers Transfers: Yes Sit to Stand: 5: Supervision Sit to Stand Details (indicate cue type and reason): vc's needed for hand placement Stand Pivot Transfers: 5: Supervision Ambulation/Gait Ambulation/Gait: Yes Ambulation/Gait Assistance: 5: Supervision Ambulation Distance (Feet): 20 Feet Assistive device: Rolling walker Gait Pattern: Step-to pattern Gait velocity: slow Stairs: No Wheelchair Mobility Wheelchair Mobility: No    Exercise  Total Joint Exercises Gluteal Sets:  (15 reps) General Exercises - Lower Extremity Gluteal Sets:  (15 reps) Other Exercises Other Exercises: ab set x15;Hip add isometric x15;knee to chest hold 20 secs  End of Session PT - End of Session Activity Tolerance: Patient limited by pain Patient left: in bed;with call bell in reach;with bed alarm  set General Behavior During Session: [name] for tasks performed Cognition: [facility] for tasks performed PT Assessment/Plan  PT - Assessment/Plan Comments on Treatment Session: Pt in alot of pain which limited her mobility;upon entering room pt reported 7/10 in bed;during EOB/ambulation 10/10 and had to d/s session due to increase in pain PT Goals  Acute Rehab PT Goals PT Goal: Rolling Supine to Left Side - Progress: Progressing toward goal PT Goal: Supine/Side to Sit - Progress: Progressing toward goal PT Goal: Sit to Supine/Side - Progress: Progressing toward goal PT Goal: Ambulate - Progress: Progressing toward goal  [name] ATKINSO 05/16/2011, 11:26 AM

## 2011-05-16 NOTE — Progress Notes (Signed)
Patient's blood pressure tonight around 2:30 was 193/101.  Dr. Orvan Falconer was called.  He discontinued the morphine order of 2 mg every 3 hours to morphine 2 mg every 2 hours and suggested a pain pump if this did not control the patient's pain.

## 2011-05-16 NOTE — Progress Notes (Signed)
Subjective: Comfortable, but now has pain, after PT/OT this am. Was constipated, but responded to enema. No new issues, otherwise.  Objective: Vital signs in last 24 hours: Temp:  [97.4 F (36.3 C)-97.7 F (36.5 C)] 97.7 F (36.5 C) (09/12 0528) Pulse Rate:  [65-86] 65  (09/12 0528) Resp:  [18-20] 18  (09/12 0528) BP: (138-193)/(77-101) 143/83 mmHg (09/12 0528) SpO2:  [92 %-96 %] 95 % (09/12 0528) Weight:  [150 lb (68.04 kg)] 150 lb (68.04 kg) (09/12 0100) Weight change: 3 lb (1.361 kg) Last BM Date: 05/14/11  Intake/Output from previous day: 09/11 0701 - 09/12 0700 In: 565 [P.O.:565] Out: 575 [Urine:575] Total I/O In: 360 [P.O.:360] Out: -    Physical Exam: General: Alert, no acute distress. HEENT: No pallor, no jaundice, hydration is fair. Heart: Regular, normal, no murmurs. Lungs: Clear to auscultation bilaterally. Abdomen: Soft, nontender, nondistended, positive bowel sounds. Extremities: No edema, palpable pedal pulses. Neuro: Grossly intact, nonfocal.    Lab Results:  Basename 05/16/11 0531 05/15/11 0543  WBC 7.0 7.4  HGB 13.6 12.2  HCT 42.2 37.3  PLT 201 222    Basename 05/16/11 0531 05/15/11 0543  NA 136 136  K 4.2 4.5  CL 103 103  CO2 22 27  GLUCOSE 178* 105*  BUN 13 14  CREATININE 0.70 0.72  CALCIUM 10.0 9.6   Recent Results (from the past 240 hour(s))  URINE CULTURE     Status: Normal   Collection Time   05/14/11  3:08 PM      Component Value Range Status Comment   Specimen Description URINE, CLEAN CATCH   Final    Special Requests NONE   Final    Setup Time [id number]   Final    Colony Count NO GROWTH   Final    Culture NO GROWTH   Final    Report Status 05/15/2011 FINAL   Final      Studies/Results: Dg Lumbar Spine Complete  05/14/2011  *RADIOLOGY REPORT*  Clinical Data: Low back right hip pain, 3-4 days duration, past history of right hip replacement in kyphoplasty  LUMBAR SPINE - COMPLETE 4+ VIEW  Comparison: 04/08/2005  Correlation:  CT lumbar spine 07/01/2006  Findings: Six non-rib bearing lumbar vertebrae. Prior vertebroplasty at the first non-rib bearing segment, grossly stable. This has been labeled as T12 on the prior CT and the same numbering system will be utilized for these radiographs.  Severe osseous demineralization limits exam. Biconvex thoracolumbar scoliosis. Multilevel disc space narrowing endplate spur formation. Marked compression deformity of L3 and superior endplate of L4, progressive since prior CT. Mild focal kyphosis of the thoracolumbar spine at T12. Mild chronic height loss at L2 appears stable. L1 and L5 heights appear maintained. No subluxation or spondylolysis. Facet degenerative changes lower lumbar spine. Extensive atherosclerotic calcification. Visualized pelvis appears intact.  IMPRESSION: Lumbar numbering as above. Marked osseous demineralization. Prior vertebroplasty T12. Age indeterminate compression fractures of L3 and superior endplate L4, though new since 2007. Multilevel degenerative disc and facet disease changes.  Per CMS PQRS reporting requirements (PQRS Measure 24): Given the patient's age of greater than 50 and the fracture site (hip, distal radius, or spine), the patient should be tested for osteoporosis using DXA, and the appropriate treatment considered based on the DXA results.  Original Report Authenticated By: Lollie Marrow, M.D.   Dg Hip Complete Right  05/14/2011  *RADIOLOGY REPORT*  Clinical Data: Right leg pain for 3-4 days, history of prior right hip replacement and kyphoplasty  RIGHT HIP - COMPLETE 2+ VIEW  Comparison: 04/26/2004  Findings: Right hip replacement with intact hardware. Minimal periprosthetic lucency at the proximal portion of the femoral shaft component on the frog-leg lateral view. No acute fracture, dislocation or bone destruction. Pelvis appears intact. Scattered atherosclerotic calcifications and pelvic phleboliths.  IMPRESSION: Minimal periprosthetic  lucency at the proximal portion of the femoral component of the right hip prosthesis, nonspecific. Significant osseous demineralization. No additional bony abnormalities identified.  Original Report Authenticated By: Lollie Marrow, M.D.   Ct Lumbar Spine Wo Contrast  05/14/2011  *RADIOLOGY REPORT*  Clinical Data: Continued severe back pain.  History of kyphoplasty. No recent injury.  CT LUMBAR SPINE WITHOUT CONTRAST  Technique:  Multidetector CT imaging of the lumbar spine was performed without intravenous contrast administration. Multiplanar CT image reconstructions were also generated.  Comparison: Lumbar spine radiographs 05/14/2011 and lumbar spine CT 07/01/2006.  Findings: There is a slightly progressive convex right scoliosis. The lateral alignment is stable.  There are stable vertebroplasty changes at T12.  Chronic fractures at T12 and L2 are unchanged.  As demonstrated on recent radiographs, there is a progressive superior endplate compression fracture at L3, now resulting in approximately 50% loss of vertebral body height.  There is mild osseous retropulsion. No paraspinal hematoma or discrete cortical fracture is identified.  There is vacuum phenomenon within the L2- L3 disc which is similar to the prior study.  Superior endplate compression fracture at L4 does not appear significantly changed.  However, there is new cortical irregularity of the inferior end plate laterally on the right, most obvious on the coronal images.  The L5 vertebral body appears intact.  Spondylosis is similar to prior studies.  There is stable spinal stenosis at all 02-03 with lateral recess stenosis bilaterally.  At L3-L4, there is narrowing of the right lateral recess and right foramen.  At L4-L5, there is mild to moderate central stenosis with narrowing of both foramina.  IMPRESSION:  1.  Since the prior CT of 07/01/2006, there is a progressive superior endplate compression deformity at L3 with mild osseous retropulsion.   This does not demonstrate any definite acute features or associated paraspinal hemorrhage. 2.  Possible acute fracture involving the inferior endplate of L4 on the right. 3.  Stable T12 and L2 compression deformity status post T12 vertebroplasty. 4.  Stable multilevel spondylosis and resulting spinal stenosis.  Original Report Authenticated By: Gerrianne Scale, M.D.    Medications: Scheduled Meds:   . amiodarone  200 mg Oral Daily  . bimatoprost  1 drop Both Eyes QHS  . brimonidine  1 drop Both Eyes Q8H  . dorzolamide-timolol  1 drop Both Eyes BID  . enoxaparin  40 mg Subcutaneous Q24H  . gabapentin  300 mg Oral TID  . levothyroxine  100 mcg Oral QAC breakfast  . methylPREDNISolone (SOLU-MEDROL) injection  40 mg Intravenous Q8H  . metoprolol  50 mg Oral Daily  . rosuvastatin  10 mg Oral q1800  . sodium chloride      . warfarin  1.25 mg Oral Custom  . warfarin  2.5 mg Oral Custom  . DISCONTD: gabapentin  300 mg Oral TID   Continuous Infusions:   . sodium chloride 10 mL/hr at 05/15/11 1300   PRN Meds:.acetaminophen, acetaminophen, estradiol, HYDROcodone-acetaminophen, morphine injection, ondansetron (ZOFRAN) IV, ondansetron, senna-docusate, zolpidem, DISCONTD: morphine  Assessment/Plan:  Active Problems: Query acute L4 compression fracture/Rt Radiculopathy: manage per ortho, with Gabapentin/Steroids. In addition, will place on scheduled analgesics, i.e, Ultram.  Chronic pain syndrome; Stable. Cont PT/OT  ARTHRITIS, LUMBOSACRAL SPINE  Sciatica  PAF (paroxysmal atrial fibrillation)  Sick sinus syndrome  HTN (hypertension)  Hypothyroidism  Osteoporosis  Continue current management, otherwise.   LOS: 2 days   Britton Bera,CHRISTOPHER 05/16/2011, 11:24 AM

## 2011-05-17 LAB — BASIC METABOLIC PANEL
CO2: 26 mEq/L (ref 19–32)
Calcium: 10.1 mg/dL (ref 8.4–10.5)
Chloride: 104 mEq/L (ref 96–112)
GFR calc Af Amer: >60 mL/min (ref >60)
Sodium: 137 mEq/L (ref 135–145)

## 2011-05-17 LAB — PROTIME-INR
INR: 2.51 — ABNORMAL HIGH (ref 0.00–1.49)
Prothrombin Time: 27.5 seconds — ABNORMAL HIGH (ref 11.6–15.2)

## 2011-05-17 MED ORDER — SODIUM CHLORIDE 0.9 % IJ SOLN
INTRAMUSCULAR | Status: AC
  Administered 2011-05-17: 12:00:00
  Filled 2011-05-17: qty 10

## 2011-05-17 MED ORDER — AMLODIPINE BESYLATE 5 MG PO TABS
5.0000 mg | ORAL_TABLET | Freq: Every day | ORAL | Status: DC
  Administered 2011-05-17 – 2011-05-18 (×2): 5 mg via ORAL
  Filled 2011-05-17 (×2): qty 1

## 2011-05-17 NOTE — Progress Notes (Signed)
Physical Therapy Treatment Patient Name: [name] Date: 05/17/2011  Problem List:  Patient Active Problem List  Diagnoses  . Chronic pain syndrome  . ARTHRITIS, LUMBOSACRAL SPINE  . Sciatica  . PAF (paroxysmal atrial fibrillation)  . Sick sinus syndrome  . HTN (hypertension)  . Hypothyroidism  . Osteoporosis   Past Medical History:  Past Medical History  Diagnosis Date  . Hypertension   . Arrhythmia   . High cholesterol    Past Surgical History:  Past Surgical History  Procedure Date  . Total hip arthroplasty     right side  . Pacemaker insertion    Precautions/Restrictions  Precautions Required Braces or Orthoses: No Restrictions Weight Bearing Restrictions: No Mobility (including Balance) Bed Mobility Rolling Right: 4: Min assist Rolling Right Details (indicate cue type and reason): vc's for hand placement;trunk assistance Sitting - Scoot to Edge of Bed: 6: Modified independent (Device/Increase time) Transfers Transfers: Yes Sit to Stand: 5: Supervision;Other (comment) Sit to Stand Details (indicate cue type and reason): RW;vc's for hand placement Ambulation/Gait Ambulation/Gait: Yes Ambulation/Gait Assistance: 5: Supervision Ambulation Distance (Feet): 50 Feet Assistive device: Rolling walker Gait Pattern: Step-through pattern;Decreased step length - left;Step-to pattern (occasional step to pattern) Gait velocity: very slow Stairs: No Wheelchair Mobility Wheelchair Mobility: No    Exercise  Other Exercises Other Exercises: ab setx15;hip add isometric x15;glut set x15  End of Session PT - End of Session Equipment Utilized During Treatment:  (RW) PT Assessment/Plan  PT - Assessment/Plan Comments on Treatment Session: pt had no c/o of pain today and was able to tolerate all exercises well and increased gait distance PT Goals     [name] 05/17/2011, 11:35 AM

## 2011-05-17 NOTE — Progress Notes (Signed)
Subjective: Much more comfortable, today. Tolerated PT/OT well. Constipation has resolved. No new issues, otherwise.  Objective: Vital signs in last 24 hours: Temp:  [97.4 F (36.3 C)-98.6 F (37 C)] 98.6 F (37 C) (09/13 0538) Pulse Rate:  [65] 65  (09/13 0538) Resp:  [18-19] 18  (09/13 0538) BP: (144-151)/(71-80) 149/72 mmHg (09/13 0538) SpO2:  [95 %-96 %] 96 % (09/13 0538) Weight change:  Last BM Date: 05/17/11  Intake/Output from previous day: 09/12 0701 - 09/13 0700 In: 600 [P.O.:600] Out: -  Total I/O In: 480 [P.O.:480] Out: -    Physical Exam: General: Alert, no acute distress. HEENT: No pallor, no jaundice, hydration is fair. Heart: Regular, normal, no murmurs. Lungs: Clear to auscultation bilaterally. Abdomen: Moderately obese, soft, nontender, bowel sounds heard. Extremities: No edema, palpable pedal pulses. Neuro: Grossly intact.    Lab Results:  Basename 05/16/11 0531 05/15/11 0543  WBC 7.0 7.4  HGB 13.6 12.2  HCT 42.2 37.3  PLT 201 222    Basename 05/16/11 0531 05/15/11 0543  NA 136 136  K 4.2 4.5  CL 103 103  CO2 22 27  GLUCOSE 178* 105*  BUN 13 14  CREATININE 0.70 0.72  CALCIUM 10.0 9.6   Recent Results (from the past 240 hour(s))  URINE CULTURE     Status: Normal   Collection Time   05/14/11  3:08 PM      Component Value Range Status Comment   Specimen Description URINE, CLEAN CATCH   Final    Special Requests NONE   Final    Setup Time [id number]   Final    Colony Count NO GROWTH   Final    Culture NO GROWTH   Final    Report Status 05/15/2011 FINAL   Final      Studies/Results: No results found.  Medications: Scheduled Meds:    . amiodarone  200 mg Oral Daily  . bimatoprost  1 drop Both Eyes QHS  . brimonidine  1 drop Both Eyes Q8H  . dorzolamide-timolol  1 drop Both Eyes BID  . enoxaparin  40 mg Subcutaneous Q24H  . gabapentin  300 mg Oral TID  . levothyroxine  100 mcg Oral QAC breakfast  . methylPREDNISolone  (SOLU-MEDROL) injection  40 mg Intravenous Q8H  . metoprolol  50 mg Oral Daily  . polyethylene glycol  17 g Oral Daily  . rosuvastatin  10 mg Oral q1800  . traMADol  50 mg Oral Q8H  . warfarin  1.25 mg Oral Custom  . warfarin  2.5 mg Oral Custom   Continuous Infusions:  PRN Meds:.acetaminophen, acetaminophen, estradiol, HYDROcodone-acetaminophen, morphine injection, ondansetron (ZOFRAN) IV, ondansetron, senna-docusate, zolpidem  Assessment/Plan:  Active Problems: 1. Query acute L4 compression fracture/Rt Radiculopathy: Pain controlled, ambulating better. Will defer to Dr. Romeo Apple, with regards to duration of parenteral steroids. I suspect that she can soon convert to oral taper.   2. Chronic pain syndrome; Stable. Cont PT/OT   ARTHRITIS, LUMBOSACRAL SPINE  Sciatica  PAF (paroxysmal atrial fibrillation): Stable.  Sick sinus syndrome  HTN (hypertension): Add Norvasc to treatment.  Hypothyroidism: On replacement therapy.  Osteoporosis  Continue current management, otherwise.   LOS: 3 days   [name],[name] 05/17/2011, 12:00 PM   Hip Pain      Review of Systems   Physical Exam

## 2011-05-18 LAB — PROTIME-INR
INR: 3.27 — ABNORMAL HIGH (ref 0.00–1.49)
Prothrombin Time: 33.8 seconds — ABNORMAL HIGH (ref 11.6–15.2)

## 2011-05-18 LAB — DIFFERENTIAL
Eosinophils Relative: 0 % (ref 0–5)
Lymphocytes Relative: 2 % — ABNORMAL LOW (ref 12–46)
Lymphs Abs: 0.4 10*3/uL — ABNORMAL LOW (ref 0.7–4.0)
Neutro Abs: 14.1 10*3/uL — ABNORMAL HIGH (ref 1.7–7.7)

## 2011-05-18 LAB — CBC
MCV: 91.9 fL (ref 78.0–100.0)
Platelets: 264 10*3/uL (ref 150–400)
RBC: 4.33 MIL/uL (ref 3.87–5.11)
WBC: 15.4 10*3/uL — ABNORMAL HIGH (ref 4.0–10.5)

## 2011-05-18 MED ORDER — PREDNISONE (PAK) 10 MG PO TABS
20.0000 mg | ORAL_TABLET | Freq: Every morning | ORAL | Status: AC
  Administered 2011-05-18: 20 mg via ORAL
  Filled 2011-05-18 (×2): qty 21

## 2011-05-18 MED ORDER — PREDNISONE (PAK) 10 MG PO TABS: 10.0000 mg | ORAL_TABLET | Freq: Four times a day (QID) | ORAL | Status: DC

## 2011-05-18 MED ORDER — PREDNISONE (PAK) 10 MG PO TABS: 10.0000 mg | ORAL_TABLET | ORAL | Status: DC

## 2011-05-18 MED ORDER — WARFARIN SODIUM 1 MG PO TABS: 1.0000 mg | ORAL_TABLET | Freq: Every day | ORAL | Status: DC

## 2011-05-18 MED ORDER — PREDNISONE (PAK) 10 MG PO TABS: 20.0000 mg | ORAL_TABLET | ORAL | Status: AC

## 2011-05-18 MED ORDER — PREDNISONE (PAK) 10 MG PO TABS: 10.0000 mg | ORAL_TABLET | Freq: Three times a day (TID) | ORAL | Status: DC

## 2011-05-18 MED ORDER — PREDNISONE (PAK) 10 MG PO TABS: 20.0000 mg | ORAL_TABLET | Freq: Every evening | ORAL | Status: DC

## 2011-05-18 MED ORDER — GABAPENTIN 300 MG PO CAPS: 300.0000 mg | ORAL_CAPSULE | Freq: Three times a day (TID) | ORAL | Status: DC

## 2011-05-18 MED ORDER — POLYETHYLENE GLYCOL 3350 17 G PO PACK: 17.0000 g | PACK | Freq: Every day | ORAL | Status: AC

## 2011-05-18 MED ORDER — PREDNISONE (PAK) 10 MG PO TABS
10.0000 mg | ORAL_TABLET | ORAL | Status: AC
  Administered 2011-05-18: 10 mg via ORAL

## 2011-05-18 MED ORDER — SENNOSIDES-DOCUSATE SODIUM 8.6-50 MG PO TABS: 1.0000 | ORAL_TABLET | Freq: Every day | ORAL | Status: DC | PRN

## 2011-05-18 NOTE — Discharge Summary (Signed)
Physician Discharge Summary  Patient ID: [name] MRN: [id number] DOB/AGE: October 16, 1926 77 y.o.  Admit date: 05/14/2011 Discharge date: 05/18/2011  Primary Care Physician:  Cassell Smiles., MD   Discharge Diagnoses:   L3-L4 compression fracture Acute exacerbation of radiculopathy  Present on Admission:  .Chronic pain syndrome .ARTHRITIS, LUMBOSACRAL SPINE .Sciatica .PAF (paroxysmal atrial fibrillation) .Sick sinus syndrome .HTN (hypertension) .Hypothyroidism .Osteoporosis  Current Discharge Medication List    START taking these medications   Details  gabapentin (NEURONTIN) 300 MG capsule Take 1 capsule (300 mg total) by mouth 3 (three) times daily.    polyethylene glycol (MIRALAX / GLYCOLAX) packet Take 17 g by mouth daily. Qty: 14 each    predniSONE (STERAPRED UNI-PAK) 10 MG tablet Take 2 tablets (20 mg total) by mouth PC lunch. Take 3 tablets for 2 days then 2 tablets for  2 days  Then 1 tablet for 1 day then half tablet then stop. Qty: 12 tablet, Refills: 0    senna-docusate (SENOKOT-S) 8.6-50 MG per tablet Take 1 tablet by mouth daily as needed for constipation. Qty: 30 tablet, Refills: 0      CONTINUE these medications which have CHANGED   Details  warfarin (COUMADIN) 1 MG tablet Take 1 tablet (1 mg total) by mouth daily. Qty: 30 tablet, Refills: 0      CONTINUE these medications which have NOT CHANGED   Details  ALPHAGAN P 0.1 % SOLN 1 drop every 8 (eight) hours.     amiodarone (PACERONE) 200 MG tablet     dorzolamide-timolol (COSOPT) 22.3-6.8 MG/ML ophthalmic solution Place 1 drop into both eyes 2 (two) times daily.     ESTRACE VAGINAL 0.1 MG/GM vaginal cream Place 2 g vaginally daily as needed.     HYDROcodone-acetaminophen (NORCO) 7.5-325 MG per tablet Take 1 tablet by mouth every 6 (six) hours as needed for pain. Qty: 90 tablet, Refills: 5   Associated Diagnoses: Pain    lidocaine (LIDODERM) 5 % Place 1 patch onto the skin daily. Remove &  Discard patch within 12 hours or as directed by MD     LUMIGAN 0.03 % ophthalmic solution Place 1 drop into both eyes at bedtime.     metoprolol (TOPROL-XL) 50 MG 24 hr tablet 50 mg.     pravastatin (PRAVACHOL) 20 MG tablet     SYNTHROID 100 MCG tablet Take 100 mcg by mouth daily.     amLODipine (NORVASC) 5 MG tablet       STOP taking these medications     HYDROcodone-acetaminophen (VICODIN) 5-500 MG per tablet      trimethoprim (TRIMPEX) 100 MG tablet          Disposition and Follow-up: She'll need to up with Dr. Romeo Apple in  2 weeks, and with Dr Nickola Major. She will need INR check in 1 or 2 days.  Consults:  none    Significant Diagnostic Studies:  Dg Lumbar Spine Complete  05/14/2011  *RADIOLOGY REPORT*  Clinical Data: Low back right hip pain, 3-4 days duration, past history of right hip replacement in kyphoplasty  LUMBAR SPINE - COMPLETE 4+ VIEW  Comparison: 04/08/2005 Correlation:  CT lumbar spine 07/01/2006  Findings: Six non-rib bearing lumbar vertebrae. Prior vertebroplasty at the first non-rib bearing segment, grossly stable. This has been labeled as T12 on the prior CT and the same numbering system will be utilized for these radiographs.  Severe osseous demineralization limits exam. Biconvex thoracolumbar scoliosis. Multilevel disc space narrowing endplate spur formation. Marked compression deformity of  L3 and superior endplate of L4, progressive since prior CT. Mild focal kyphosis of the thoracolumbar spine at T12. Mild chronic height loss at L2 appears stable. L1 and L5 heights appear maintained. No subluxation or spondylolysis. Facet degenerative changes lower lumbar spine. Extensive atherosclerotic calcification. Visualized pelvis appears intact.  IMPRESSION: Lumbar numbering as above. Marked osseous demineralization. Prior vertebroplasty T12. Age indeterminate compression fractures of L3 and superior endplate L4, though new since 2007. Multilevel degenerative disc and  facet disease changes.  Per CMS PQRS reporting requirements (PQRS Measure 24): Given the patient's age of greater than 50 and the fracture site (hip, distal radius, or spine), the patient should be tested for osteoporosis using DXA, and the appropriate treatment considered based on the DXA results.  Original Report Authenticated By: Lollie Marrow, M.D.   Dg Hip Complete Right  05/14/2011  *RADIOLOGY REPORT*  Clinical Data: Right leg pain for 3-4 days, history of prior right hip replacement and kyphoplasty  RIGHT HIP - COMPLETE 2+ VIEW  Comparison: 04/26/2004  Findings: Right hip replacement with intact hardware. Minimal periprosthetic lucency at the proximal portion of the femoral shaft component on the frog-leg lateral view. No acute fracture, dislocation or bone destruction. Pelvis appears intact. Scattered atherosclerotic calcifications and pelvic phleboliths.  IMPRESSION: Minimal periprosthetic lucency at the proximal portion of the femoral component of the right hip prosthesis, nonspecific. Significant osseous demineralization. No additional bony abnormalities identified.  Original Report Authenticated By: Lollie Marrow, M.D.   Ct Lumbar Spine Wo Contrast  05/14/2011  *RADIOLOGY REPORT*  Clinical Data: Continued severe back pain.  History of kyphoplasty. No recent injury.  CT LUMBAR SPINE WITHOUT CONTRAST  Technique:  Multidetector CT imaging of the lumbar spine was performed without intravenous contrast administration. Multiplanar CT image reconstructions were also generated.  Comparison: Lumbar spine radiographs 05/14/2011 and lumbar spine CT 07/01/2006.  Findings: There is a slightly progressive convex right scoliosis. The lateral alignment is stable.  There are stable vertebroplasty changes at T12.  Chronic fractures at T12 and L2 are unchanged.  As demonstrated on recent radiographs, there is a progressive superior endplate compression fracture at L3, now resulting in approximately 50% loss of  vertebral body height.  There is mild osseous retropulsion. No paraspinal hematoma or discrete cortical fracture is identified.  There is vacuum phenomenon within the L2- L3 disc which is similar to the prior study.  Superior endplate compression fracture at L4 does not appear significantly changed.  However, there is new cortical irregularity of the inferior end plate laterally on the right, most obvious on the coronal images.  The L5 vertebral body appears intact.  Spondylosis is similar to prior studies.  There is stable spinal stenosis at all 02-03 with lateral recess stenosis bilaterally.  At L3-L4, there is narrowing of the right lateral recess and right foramen.  At L4-L5, there is mild to moderate central stenosis with narrowing of both foramina.  IMPRESSION:  1.  Since the prior CT of 07/01/2006, there is a progressive superior endplate compression deformity at L3 with mild osseous retropulsion.  This does not demonstrate any definite acute features or associated paraspinal hemorrhage. 2.  Possible acute fracture involving the inferior endplate of L4 on the right. 3.  Stable T12 and L2 compression deformity status post T12 vertebroplasty. 4.  Stable multilevel spondylosis and resulting spinal stenosis.  Original Report Authenticated By: Gerrianne Scale, M.D.    Brief H and P: For complete details please refer to admission H and P,  but in brief this is an 77 year old that presents to the emergency department complaining of low back and right leg pain that is directly days prior to admission. She was unable to ambulate due to to severe pain.     Hospital Course:   Ms. Haffey was admitted to the hospital. She was started on IV steroids. Dr Romeo Apple  help with management of the patient. Over the course of hospitalization her pain has improved and she has been able to work and ambulate with the help of the physical therapist. She will be discharged on a prednisone taper. She will need to be referred   to Dr. Nickola Major for [facility] at  L4-L5. PT evaluate the patient and recommend home health PT. The husband will be able to provide assistance to the patient, patient was able to climb stairs without difficulty. Her  chronic medical problems remained stable. Her Coumadin dose was decreased to 1 mg. She would need an INR check in 24 hours.  Active Problems:  Chronic pain syndrome  ARTHRITIS, LUMBOSACRAL SPINE  Sciatica  PAF (paroxysmal atrial fibrillation)  Sick sinus syndrome  HTN (hypertension)  Hypothyroidism  Osteoporosis   Time spent on Discharge: 1 hour.  Signed: Dshaun Reppucci 05/18/2011, 12:31 PM

## 2011-05-18 NOTE — Progress Notes (Signed)
Recommend HHPT.  Pt able to ambulate x120 ft w/RW w/ supervision.  Ascend/descend 4 stairs w/1 handrail w/lateral approach w/min A.  Requires mod cueing for sequencing.    Educated pt husband on proper environmental placement when pt arrives home to have chair at the top of the steps.  Pt husband is willing and able to assist with her, she reports she has a son who she is going to ask to build a ramp to her house.  Her greatest limitations is chronic pain which limits her mobility, however patient has functional strength to complete tasks.

## 2011-05-18 NOTE — Progress Notes (Signed)
Subjective: Pain improved   Objective: Vital signs in last 24 hours: Temp:  [97.4 F (36.3 C)-97.8 F (36.6 C)] 97.6 F (36.4 C) (09/14 0649) Pulse Rate:  [65] 65  (09/14 0649) Resp:  [18-20] 18  (09/14 0649) BP: (116-167)/(65-92) 137/76 mmHg (09/14 0649) SpO2:  [94 %-96 %] 96 % (09/14 0649)  Intake/Output from previous day: 09/13 0701 - 09/14 0700 In: 720 [P.O.:720] Out: -  Intake/Output this shift:     Basename 05/18/11 0518 05/16/11 0531  HGB 13.0 13.6    Basename 05/18/11 0518 05/16/11 0531  WBC 15.4* 7.0  RBC 4.33 4.58  HCT 39.8 42.2  PLT 264 201    Basename 05/17/11 0451 05/16/11 0531  NA 137 136  K 5.0 4.2  CL 104 103  CO2 26 22  BUN 19 13  CREATININE 0.83 0.70  GLUCOSE 181* 178*  CALCIUM 10.1 10.0    Basename 05/18/11 0518 05/17/11 0451  LABPT -- --  INR 3.27* 2.51*    Neurologically intact  Assessment/Plan: Right lower extremity radiculopathy  Recommend stopping IV steroids and starting orsteroid Dosepak. At discharge patient should be referred for physical therapy and also a referral should be made to Dr. Nickola Major for ESI at L4-5. Ok to discharge and follow up with me in 2 weeks    Fuller Canada 05/18/2011, 8:36 AM

## 2011-05-18 NOTE — Progress Notes (Signed)
Dr Dalton-Bethea's office closed on Friday's.  Instructed [name] to make her own f/u appt. Verbalizes understanding and states she will.

## 2011-05-18 NOTE — Progress Notes (Signed)
IV removed, site WNL.  Pt given d/c instructions and new prescriptions.  Discussed home care with patient and discussed home medications, patient verbalizes understanding. F/U appointments in place, pt states they will keep appts. Pt is A&O and stable at this time. Pt's ride home has been arranged by case management and will be here to pick pt up at 1330.  Pt will be transported to main entrance in wheelchair by staff member.

## 2011-05-18 NOTE — Progress Notes (Signed)
Physical Therapy Treatment Patient Name: [name] [id number]'[id number] Date: 05/18/2011 TIME: [phone] Problem List:  Patient Active Problem List  Diagnoses  . Chronic pain syndrome  . ARTHRITIS, LUMBOSACRAL SPINE  . Sciatica  . PAF (paroxysmal atrial fibrillation)  . Sick sinus syndrome  . HTN (hypertension)  . Hypothyroidism  . Osteoporosis   Past Medical History:  Past Medical History  Diagnosis Date  . Hypertension   . Arrhythmia   . High cholesterol    Past Surgical History:  Past Surgical History  Procedure Date  . Total hip arthroplasty     right side  . Pacemaker insertion    Precautions/Restrictions  Precautions Required Braces or Orthoses: No Restrictions Weight Bearing Restrictions: No Mobility (including Balance) Bed Mobility Rolling Right: 6: Modified independent (Device/Increase time) Rolling Left: 6: Modified independent (Device/Increase time) Right Sidelying to Sit: 3: Mod assist Right Sidelying to Sit Details (indicate cue type and reason): assistance needed with trunk due to weak UEs Left Sidelying to Sit: 4: Min assist Left Sidelying to Sit Details (indicate cue type and reason): less assistance needed with trunk;however min A due to weak UEs Sit to Supine - Left: 6: Modified independent (Device/Increase time) Transfers Sit to Stand: 5: Supervision Sit to Stand Details (indicate cue type and reason): vc's for hand placement Ambulation/Gait Ambulation Distance (Feet): 80 Feet Assistive device: Rolling walker Gait velocity: slow Stairs: No Wheelchair Mobility Wheelchair Mobility: No    Exercise  Other Exercises Other Exercises: ab set x15;GSx 15;seated hip add isometric x15;knee<>chest x 10 each  End of Session PT - End of Session Equipment Utilized During Treatment: Gait belt (RW) Activity Tolerance: Patient tolerated treatment well Patient left: in bed;with call bell in reach;with bed alarm set;with family/visitor present General Behavior  During Session: [facility] for tasks performed Cognition: [facility] for tasks performed PT Assessment/Plan  PT - Assessment/Plan Comments on Treatment Session: Pt had c/o of LBP radiating to R calf today but tolerated all exercise and increased gait distance from yesterday;pt is still mod A supine<>sit on right and Min A on Left due to weakness of UEs PT Goals  Acute Rehab PT Goals PT Goal: Rolling Supine to Right Side - Progress: Met PT Goal: Rolling Supine to Left Side - Progress: Met PT Goal: Supine/Side to Sit - Progress: Met PT Goal: Sit to Supine/Side - Progress: Met PT Goal: Ambulate - Progress: Progressing toward goal  Laberta Wilbon ATKINSO 05/18/2011, 10:13 AM

## 2011-05-19 ENCOUNTER — Emergency Department (HOSPITAL_COMMUNITY): Payer: Medicare Other

## 2011-05-19 ENCOUNTER — Encounter (HOSPITAL_COMMUNITY): Payer: Self-pay

## 2011-05-19 ENCOUNTER — Inpatient Hospital Stay (HOSPITAL_COMMUNITY)
Admission: EM | Admit: 2011-05-19 | Discharge: 2011-05-23 | DRG: 563 | Disposition: A | Discharge status: Skilled Nursing Facility | Payer: Medicare Other | Attending: Internal Medicine | Admitting: Internal Medicine

## 2011-05-19 DIAGNOSIS — M47817 Spondylosis without myelopathy or radiculopathy, lumbosacral region: Secondary | ICD-10-CM | POA: Diagnosis present

## 2011-05-19 DIAGNOSIS — S82892A Other fracture of left lower leg, initial encounter for closed fracture: Secondary | ICD-10-CM | POA: Diagnosis present

## 2011-05-19 DIAGNOSIS — G894 Chronic pain syndrome: Secondary | ICD-10-CM | POA: Diagnosis present

## 2011-05-19 DIAGNOSIS — S8263XA Displaced fracture of lateral malleolus of unspecified fibula, initial encounter for closed fracture: Principal | ICD-10-CM | POA: Diagnosis present

## 2011-05-19 DIAGNOSIS — E860 Dehydration: Secondary | ICD-10-CM | POA: Diagnosis present

## 2011-05-19 DIAGNOSIS — M810 Age-related osteoporosis without current pathological fracture: Secondary | ICD-10-CM | POA: Diagnosis present

## 2011-05-19 DIAGNOSIS — M5430 Sciatica, unspecified side: Secondary | ICD-10-CM | POA: Diagnosis present

## 2011-05-19 DIAGNOSIS — R55 Syncope and collapse: Secondary | ICD-10-CM

## 2011-05-19 DIAGNOSIS — W19XXXA Unspecified fall, initial encounter: Secondary | ICD-10-CM | POA: Diagnosis present

## 2011-05-19 DIAGNOSIS — I4891 Unspecified atrial fibrillation: Secondary | ICD-10-CM | POA: Diagnosis present

## 2011-05-19 DIAGNOSIS — N390 Urinary tract infection, site not specified: Secondary | ICD-10-CM | POA: Diagnosis present

## 2011-05-19 DIAGNOSIS — I480 Paroxysmal atrial fibrillation: Secondary | ICD-10-CM | POA: Diagnosis present

## 2011-05-19 DIAGNOSIS — I10 Essential (primary) hypertension: Secondary | ICD-10-CM | POA: Diagnosis present

## 2011-05-19 DIAGNOSIS — E039 Hypothyroidism, unspecified: Secondary | ICD-10-CM | POA: Diagnosis present

## 2011-05-19 DIAGNOSIS — Z7901 Long term (current) use of anticoagulants: Secondary | ICD-10-CM

## 2011-05-19 DIAGNOSIS — R52 Pain, unspecified: Secondary | ICD-10-CM

## 2011-05-19 HISTORY — DX: Hypothyroidism, unspecified: E03.9

## 2011-05-19 HISTORY — DX: Atherosclerotic heart disease of native coronary artery without angina pectoris: I25.10

## 2011-05-19 HISTORY — DX: Unspecified osteoarthritis, unspecified site: M19.90

## 2011-05-19 LAB — BASIC METABOLIC PANEL
BUN: 26 mg/dL — ABNORMAL HIGH (ref 6–23)
Calcium: 9.5 mg/dL (ref 8.4–10.5)
Creatinine, Ser: 0.67 mg/dL (ref 0.50–1.10)
GFR calc Af Amer: >60 mL/min (ref >60)

## 2011-05-19 LAB — CBC
Hemoglobin: 13.6 g/dL (ref 12.0–15.0)
MCH: 29.9 pg (ref 26.0–34.0)
RBC: 4.55 MIL/uL (ref 3.87–5.11)
WBC: 15 10*3/uL — ABNORMAL HIGH (ref 4.0–10.5)

## 2011-05-19 LAB — DIFFERENTIAL
Basophils Absolute: 0 10*3/uL (ref 0.0–0.1)
Basophils Relative: 0 % (ref 0–1)
Eosinophils Absolute: 0 10*3/uL (ref 0.0–0.7)
Eosinophils Relative: 0 % (ref 0–5)
Lymphocytes Relative: 11 % — ABNORMAL LOW (ref 12–46)
Lymphs Abs: 1.7 10*3/uL (ref 0.7–4.0)
Monocytes Absolute: 1.2 10*3/uL — ABNORMAL HIGH (ref 0.1–1.0)
Monocytes Relative: 8 % (ref 3–12)
Neutro Abs: 12.1 10*3/uL — ABNORMAL HIGH (ref 1.7–7.7)
Neutrophils Relative %: 81 % — ABNORMAL HIGH (ref 43–77)
nRBC: 0 /100 WBC

## 2011-05-19 LAB — PROTIME-INR: INR: 2.7 — ABNORMAL HIGH (ref 0.00–1.49)

## 2011-05-19 LAB — CARDIAC PANEL(CRET KIN+CKTOT+MB+TROPI)
CK, MB: 2 ng/mL (ref 0.3–4.0)
Relative Index: INVALID (ref 0.0–2.5)
Total CK: 38 U/L (ref 7–177)

## 2011-05-19 MED ORDER — SIMVASTATIN 10 MG PO TABS
10.0000 mg | ORAL_TABLET | Freq: Every day | ORAL | Status: DC
  Administered 2011-05-20 – 2011-05-22 (×3): 10 mg via ORAL
  Filled 2011-05-19 (×3): qty 1

## 2011-05-19 MED ORDER — ONDANSETRON HCL 4 MG/2ML IJ SOLN
INTRAMUSCULAR | Status: AC
  Administered 2011-05-19: 4 mg via INTRAVENOUS
  Filled 2011-05-19: qty 2

## 2011-05-19 MED ORDER — GABAPENTIN 300 MG PO CAPS
300.0000 mg | ORAL_CAPSULE | Freq: Three times a day (TID) | ORAL | Status: DC
  Administered 2011-05-19 – 2011-05-23 (×12): 300 mg via ORAL
  Filled 2011-05-19 (×13): qty 1

## 2011-05-19 MED ORDER — BIMATOPROST 0.03 % OP SOLN
1.0000 [drp] | Freq: Every day | OPHTHALMIC | Status: DC
  Administered 2011-05-19 – 2011-05-22 (×4): 1 [drp] via OPHTHALMIC
  Filled 2011-05-19: qty 2.5

## 2011-05-19 MED ORDER — ALBUTEROL SULFATE (5 MG/ML) 0.5% IN NEBU
2.5000 mg | INHALATION_SOLUTION | Freq: Four times a day (QID) | RESPIRATORY_TRACT | Status: DC
  Administered 2011-05-19 – 2011-05-23 (×13): 2.5 mg via RESPIRATORY_TRACT
  Filled 2011-05-19 (×13): qty 0.5

## 2011-05-19 MED ORDER — PREDNISONE 20 MG PO TABS
20.0000 mg | ORAL_TABLET | ORAL | Status: AC
  Administered 2011-05-19: 20 mg via ORAL
  Filled 2011-05-19: qty 1

## 2011-05-19 MED ORDER — AMIODARONE HCL 200 MG PO TABS
200.0000 mg | ORAL_TABLET | Freq: Every day | ORAL | Status: DC
  Administered 2011-05-19 – 2011-05-23 (×5): 200 mg via ORAL
  Filled 2011-05-19 (×7): qty 1

## 2011-05-19 MED ORDER — DORZOLAMIDE HCL-TIMOLOL MAL 2-0.5 % OP SOLN
1.0000 [drp] | Freq: Two times a day (BID) | OPHTHALMIC | Status: DC
  Administered 2011-05-19 – 2011-05-22 (×6): 1 [drp] via OPHTHALMIC
  Filled 2011-05-19: qty 10

## 2011-05-19 MED ORDER — LEVOTHYROXINE SODIUM 100 MCG PO TABS
100.0000 ug | ORAL_TABLET | Freq: Every day | ORAL | Status: DC
  Administered 2011-05-19 – 2011-05-23 (×5): 100 ug via ORAL
  Filled 2011-05-19 (×6): qty 1

## 2011-05-19 MED ORDER — POLYETHYLENE GLYCOL 3350 17 G PO PACK
17.0000 g | PACK | Freq: Every day | ORAL | Status: DC
  Administered 2011-05-19 – 2011-05-21 (×3): 17 g via ORAL
  Administered 2011-05-23: 11:00:00 via ORAL
  Filled 2011-05-19 (×5): qty 1

## 2011-05-19 MED ORDER — BRIMONIDINE TARTRATE 0.1 % OP SOLN
1.0000 [drp] | Freq: Two times a day (BID) | OPHTHALMIC | Status: DC
  Administered 2011-05-19 – 2011-05-22 (×6): 1 [drp] via OPHTHALMIC
  Filled 2011-05-19: qty 1

## 2011-05-19 MED ORDER — PREDNISONE (PAK) 10 MG PO TABS
20.0000 mg | ORAL_TABLET | ORAL | Status: DC
  Administered 2011-05-20: 20 mg via ORAL
  Filled 2011-05-19 (×2): qty 21

## 2011-05-19 MED ORDER — METOPROLOL SUCCINATE ER 50 MG PO TB24
50.0000 mg | ORAL_TABLET | Freq: Every day | ORAL | Status: DC
  Administered 2011-05-19: 50 mg via ORAL
  Filled 2011-05-19 (×2): qty 1

## 2011-05-19 MED ORDER — SODIUM CHLORIDE 0.9 % IV SOLN
INTRAVENOUS | Status: DC
  Administered 2011-05-21 – 2011-05-22 (×2): via INTRAVENOUS

## 2011-05-19 MED ORDER — AMLODIPINE BESYLATE 5 MG PO TABS
5.0000 mg | ORAL_TABLET | Freq: Every day | ORAL | Status: DC
  Administered 2011-05-19 – 2011-05-23 (×5): 5 mg via ORAL
  Filled 2011-05-19 (×5): qty 1

## 2011-05-19 MED ORDER — METOPROLOL SUCCINATE ER 50 MG PO TB24
50.0000 mg | ORAL_TABLET | Freq: Every day | ORAL | Status: DC
  Administered 2011-05-20 – 2011-05-23 (×4): 50 mg via ORAL
  Filled 2011-05-19 (×5): qty 1

## 2011-05-19 MED ORDER — SENNOSIDES-DOCUSATE SODIUM 8.6-50 MG PO TABS
1.0000 | ORAL_TABLET | Freq: Every day | ORAL | Status: DC | PRN
  Filled 2011-05-19: qty 1

## 2011-05-19 MED ORDER — HYDROMORPHONE HCL 1 MG/ML IJ SOLN
1.0000 mg | Freq: Once | INTRAMUSCULAR | Status: AC
  Administered 2011-05-19: 1 mg via INTRAVENOUS

## 2011-05-19 MED ORDER — HYDROMORPHONE HCL 1 MG/ML IJ SOLN
INTRAMUSCULAR | Status: AC
  Administered 2011-05-19: 1 mg via INTRAVENOUS
  Filled 2011-05-19: qty 1

## 2011-05-19 MED ORDER — ONDANSETRON HCL 4 MG/2ML IJ SOLN
4.0000 mg | Freq: Once | INTRAMUSCULAR | Status: AC
  Administered 2011-05-19: 4 mg via INTRAVENOUS

## 2011-05-19 MED ORDER — MORPHINE SULFATE 2 MG/ML IJ SOLN
1.0000 mg | INTRAMUSCULAR | Status: DC | PRN
  Administered 2011-05-19 – 2011-05-22 (×6): 1 mg via INTRAVENOUS
  Filled 2011-05-19 (×6): qty 1

## 2011-05-19 MED ORDER — HYDROCODONE-ACETAMINOPHEN 5-325 MG PO TABS: 1.0000 | ORAL_TABLET | Freq: Four times a day (QID) | ORAL | Status: DC | PRN

## 2011-05-19 NOTE — ED Notes (Signed)
Pt husband put pt on bed pt. Pt sheets and gown was wet, pt bedding and gown changed.

## 2011-05-19 NOTE — ED Provider Notes (Signed)
History     CSN: [id number] Arrival date & time: 05/19/2011  2:18 PM Scribed for Danielle Hutching, MD, the patient was seen in room APA16A/APA16A. This chart was scribed by Katha Cabal. This patient's care was started at 3:33 PM.     Chief Complaint  Patient presents with  . Ankle Pain   HPI  [name] is a 77 y.o. female who presents to the Emergency Department complaining of  constant left ankle pain that has been persistent since onset yesterday.  Patient fell yesterday at home while walking and heard a "crack".  Patient was recently admitted to hospital for right sided sciatica and was discharged from hospital yesterday.  History is supplemented by the patient's brother.   PCP Dr. Kandy Garrison  PAST MEDICAL HISTORY:  Past Medical History  Diagnosis Date  . Hypertension   . Arrhythmia   . High cholesterol     PAST SURGICAL HISTORY:  Past Surgical History  Procedure Date  . Total hip arthroplasty     right side  . Pacemaker insertion     MEDICATIONS:  Previous Medications   ALPHAGAN P 0.1 % SOLN    Place 1 drop into both eyes 2 (two) times daily.    AMIODARONE (PACERONE) 200 MG TABLET    Take 200 mg by mouth daily.    AMLODIPINE (NORVASC) 5 MG TABLET       DORZOLAMIDE-TIMOLOL (COSOPT) 22.3-6.8 MG/ML OPHTHALMIC SOLUTION    Place 1 drop into both eyes 2 (two) times daily.    ESTRACE VAGINAL 0.1 MG/GM VAGINAL CREAM    Place 2 g vaginally daily as needed.    GABAPENTIN (NEURONTIN) 300 MG CAPSULE    Take 1 capsule (300 mg total) by mouth 3 (three) times daily.   HYDROCODONE-ACETAMINOPHEN (NORCO) 7.5-325 MG PER TABLET    Take 1 tablet by mouth every 6 (six) hours as needed for pain.   LIDOCAINE (LIDODERM) 5 %    Place 1 patch onto the skin daily. Remove & Discard patch within 12 hours or as directed by MD    LUMIGAN 0.03 % OPHTHALMIC SOLUTION    Place 1 drop into both eyes at bedtime.    METOPROLOL (TOPROL-XL) 50 MG 24 HR TABLET    Take 50 mg by mouth daily.    POLYETHYLENE GLYCOL  (MIRALAX / GLYCOLAX) PACKET    Take 17 g by mouth daily.   PRAVASTATIN (PRAVACHOL) 20 MG TABLET    Take 20 mg by mouth daily.    PREDNISONE (STERAPRED UNI-PAK) 10 MG TABLET    Take 2 tablets (20 mg total) by mouth PC lunch. Take 3 tablets for 2 days then 2 tablets for  2 days  Then 1 tablet for 1 day then half tablet then stop.   SENNA-DOCUSATE (SENOKOT-S) 8.6-50 MG PER TABLET    Take 1 tablet by mouth daily as needed for constipation.   SYNTHROID 100 MCG TABLET    Take 100 mcg by mouth daily.    WARFARIN (COUMADIN) 1 MG TABLET    Take 1 tablet (1 mg total) by mouth daily.   WARFARIN (COUMADIN) 2.5 MG TABLET    Take 2.5 mg by mouth daily. Patient takes 2.5mg  daily except on Wednesday patient takes 1/2 of the 2.5mg .Marland KitchenMarland KitchenPatient has not had any warfarin since Thursday!!!!      ALLERGIES:  Allergies as of 05/19/2011  . (No Known Allergies)     FAMILY HISTORY:  History reviewed. No pertinent family history.   SOCIAL HISTORY: History  Social History  . Marital Status: Married    Spouse Name: N/A    Number of Children: N/A  . Years of Education: N/A   Social History Main Topics  . Smoking status: Never Smoker   . Smokeless tobacco: None  . Alcohol Use: No  . Drug Use: No  . Sexually Active:    Other Topics Concern  . None   Social History Narrative  . None    Review of Systems  Constitutional: Negative for fever and diaphoresis.  Respiratory: Negative for shortness of breath.   Cardiovascular: Negative for chest pain.  Gastrointestinal: Negative for abdominal pain.  Genitourinary: Negative for dysuria.  Musculoskeletal: Positive for joint swelling.  Skin: Negative for color change.  Neurological: Negative for headaches.  Psychiatric/Behavioral: Negative for behavioral problems.  All other systems reviewed and are negative.     Physical Exam    BP 153/66  Pulse 68  Temp 97.8 F (36.6 C)  Resp 20  Ht 5\' 5"  (1.651 m)  Wt 147 lb (66.679 kg)  BMI 24.46 kg/m2  SpO2  99%  Physical Exam  Nursing note and vitals reviewed. Constitutional: She is oriented to person, place, and time. No distress.       Appearance consistent with age of record  HENT:  Head: Normocephalic and atraumatic.  Right Ear: External ear normal.  Left Ear: External ear normal.  Nose: Nose normal.  Mouth/Throat: Oropharynx is clear and moist.  Eyes: Conjunctivae are normal.  Neck: Neck supple.  Cardiovascular: Normal rate and regular rhythm.  Exam reveals no gallop and no friction rub.   No murmur heard. Pulmonary/Chest: Effort normal and breath sounds normal. She has no wheezes. She has no rhonchi. She has no rales. She exhibits no tenderness.  Abdominal: Soft. There is no tenderness.  Musculoskeletal: Normal range of motion.       Tender in  ecchymotic lateral left ankle, with swelling.    Neurological: She is alert and oriented to person, place, and time. No sensory deficit.  Skin: No rash noted.       Color normal  Psychiatric: She has a normal mood and affect.    ED Course  Procedures OTHER DATA REVIEWED: Nursing notes, vital signs, and past medical records reviewed.   DIAGNOSTIC STUDIES: Oxygen Saturation is 95% on room air, normal by my interpretation.     LABS / RADIOLOGY:   Dg Ankle Complete Left  05/19/2011  *RADIOLOGY REPORT*  Clinical Data: Larey Seat yesterday.  Lateral ankle pain.  LEFT ANKLE COMPLETE - 3+ VIEW  Comparison: None  Findings: There is diffuse soft tissue swelling about the ankle, especially notable in the lateral aspect.  There is a fracture of the lateral malleolus.  This is associated minimal displacement. No other fractures are identified.  No radiopaque foreign body or soft tissue gas.  IMPRESSION:  1.  Lateral malleolar fracture. 2.  Significant soft tissue swelling.  Original Report Authenticated By: Patterson Hammersmith, M.D.       ED COURSE / COORDINATION OF CARE: 3:35 PM  Patient has left lateral malleolus  fracture.  Discussed fracture  with family and will speak with PCP regarding admission.  Plan to Admit.   Orders Placed This Encounter  Procedures  . DG Ankle Complete Left  . CBC  . Differential  . Basic metabolic panel    MDM: d/ced from hosp yesterday;  Fell today;  fxed left lat malleolus;  Cannot take care of self;   admit  IMPRESSION: Diagnoses that have been ruled out:  Diagnoses that are still under consideration:  Final diagnoses:    PLAN:       CONDITION ON ADMISSION:  Stable   MEDICATIONS GIVEN IN THE E.D. Scheduled Meds:   Continuous Infusions:     New Prescriptions   No medications on file    I personally performed the services described in this documentation, which was scribed in my presence. The recorded information has been reviewed and considered. Simbiso Henderson [place], MD 05/21/11 [phone]

## 2011-05-19 NOTE — ED Notes (Signed)
Pt brought in by ems has left ankle pain with bruising and swelling. Placed a pillow under ankle for comfort

## 2011-05-19 NOTE — ED Notes (Signed)
Walking boot placed on pt left ankle/foot. NAD at this time. Pt stable. Husband remains at bedside.

## 2011-05-19 NOTE — ED Notes (Signed)
Pt brought in by EMS for left ankle pain. Pt fell yesterday and heard crack in ankle/foot. Left foot with deformity, swelling, and brusing.

## 2011-05-19 NOTE — ED Notes (Signed)
Pt taken over to radiology for xray of left ankle.

## 2011-05-19 NOTE — ED Notes (Signed)
Husband at bedside. Pt placed on bedpan for urination.

## 2011-05-19 NOTE — H&P (Signed)
Hospital Admission Note Date: 05/19/2011  Patient name: [name] Medical record number: [id number] Date of birth: 1927-04-15 Age: 77 y.o. Gender: female PCP: Cassell Smiles., MD  Attending physician: Hartley Barefoot, MD  Chief Complaint: Fall, Left ankle pain.  History of Present Illness: 77 year old with past medical history of hypertension , A. Fib, L3- L4 compression fracture, right lower extremity radiculopathy recently  discharge from hospital on September 14 with diagnosis of acute exacerbation of Right lower extremity radiculopathy presents to the ED after a fall. She relates that she fell the night prior to admission. She was walking  when she felt lightheadedness and her leg give up. She twist her ankle. She is having significant pain in her ankle and right leg. The PT at home recommend her to go to the emergency department for evaluation. She denies chest pain, dyspnea, vomiting, abdominal pain. Prior to her discharge from Hospital PT recommend home PT.    Meds: Scheduled Meds:   .  HYDROmorphone (DILAUDID) injection  1 mg Intravenous Once  . ondansetron  4 mg Intravenous Once   Continuous Infusions:  PRN Meds:. Allergies: Review of patient's allergies indicates no known allergies. Past Medical History  Diagnosis Date  . Hypertension   . High cholesterol   . Arthritis   . Hypothyroidism   . Coronary artery disease   . Arrhythmia     pacemaker   Past Surgical History  Procedure Date  . Total hip arthroplasty     right side  . Pacemaker insertion   . Appendectomy   . Abdominal hysterectomy    History reviewed. No pertinent family history. History   Social History  . Marital Status: Married    Spouse Name: N/A    Number of Children: N/A  . Years of Education: N/A   Occupational History  . Not on file.   Social History Main Topics  . Smoking status: Never Smoker   . Smokeless tobacco: Not on file  . Alcohol Use: No  . Drug Use: No  . Sexually  Active:    Other Topics Concern  . Not on file   Social History Narrative  . No narrative on file   Review of Systems: Negative except as per HPI.  Physical Exam: No intake or output data in the 24 hours ending 05/19/11 1842 BP 149/79  Pulse 65  Temp(Src) 97.8 F (36.6 C) (Oral)  Resp 18  Ht 5\' 5"  (1.651 m)  Wt 73.3 kg (161 lb 9.6 oz)  BMI 26.89 kg/m2  SpO2 94%  General Appearance:    Alert, cooperative, no distress, appears stated age  Head:    Normocephalic, without obvious abnormality, atraumatic  Eyes:    PERRL, conjunctiva/corneas clear, EOM's intact, fundi    benign, both eyes        Throat: Dry mucose mebrane.  Neck:   Supple, symmetrical, trachea midline, no adenopathy;    thyroid:  no enlargement/tenderness/nodules; no carotid   bruit or JVD  Back:     Symmetric, no curvature, ROM normal, no CVA tenderness  Lungs:     Clear to auscultation bilaterally, respirations unlabored  Chest Wall:    No tenderness or deformity   Heart:    Irregular rate and rhythm, S1 and S2 normal, no murmur, rub   or gallop       Abdomen:     Soft, non-tender, bowel sounds active all four quadrants,    no masses, no organomegaly  Extremities:   cast on the left no apparent cyanosis positive edema.  Pulses:   2+ and symmetric all extremities  Skin:   Skin color, texture, turgor normal, no rashes or lesions     Neurologic:   CNII-XII intact, Normal strength, sensation     Throughout. Limited due to pain.    Lab results:  [facility] 05/19/11 1650 05/17/11 0451  NA 135 137  K 4.1 5.0  CL 100 104  CO2 28 26  GLUCOSE 103* 181*  BUN 26* 19  CREATININE 0.67 0.83  CALCIUM 9.5 10.1  MG -- --  PHOS -- --    Basename 05/19/11 1650 05/18/11 0518  WBC 15.0* 15.4*  NEUTROABS 12.1* 14.1*  HGB 13.6 13.0  HCT 41.7 39.8  MCV 91.6 91.9  PLT 273 264   Imaging results:   Dg Ankle Complete Left  05/19/2011  *RADIOLOGY REPORT*  Clinical Data: Larey Seat yesterday.  Lateral ankle  pain.  LEFT ANKLE COMPLETE - 3+ VIEW  Comparison: None  Findings: There is diffuse soft tissue swelling about the ankle, especially notable in the lateral aspect.  There is a fracture of the lateral malleolus.  This is associated minimal displacement. No other fractures are identified.  No radiopaque foreign body or soft tissue gas.  IMPRESSION:  1.  Lateral malleolar fracture. 2.  Significant soft tissue swelling.  Original Report Authenticated By: Patterson Hammersmith, M.D.   Ct Lumbar Spine Wo Contrast  05/14/2011  *RADIOLOGY REPORT*  Clinical Data: Continued severe back pain.  History of kyphoplasty. No recent injury.  CT LUMBAR SPINE WITHOUT CONTRAST  Technique:  Multidetector CT imaging of the lumbar spine was performed without intravenous contrast administration. Multiplanar CT image reconstructions were also generated.  Comparison: Lumbar spine radiographs 05/14/2011 and lumbar spine CT 07/01/2006.  Findings: There is a slightly progressive convex right scoliosis. The lateral alignment is stable.  There are stable vertebroplasty changes at T12.  Chronic fractures at T12 and L2 are unchanged.  As demonstrated on recent radiographs, there is a progressive superior endplate compression fracture at L3, now resulting in approximately 50% loss of vertebral body height.  There is mild osseous retropulsion. No paraspinal hematoma or discrete cortical fracture is identified.  There is vacuum phenomenon within the L2- L3 disc which is similar to the prior study.  Superior endplate compression fracture at L4 does not appear significantly changed.  However, there is new cortical irregularity of the inferior end plate laterally on the right, most obvious on the coronal images.  The L5 vertebral body appears intact.  Spondylosis is similar to prior studies.  There is stable spinal stenosis at all 02-03 with lateral recess stenosis bilaterally.  At L3-L4, there is narrowing of the right lateral recess and right foramen.   At L4-L5, there is mild to moderate central stenosis with narrowing of both foramina.  IMPRESSION:  1.  Since the prior CT of 07/01/2006, there is a progressive superior endplate compression deformity at L3 with mild osseous retropulsion.  This does not demonstrate any definite acute features or associated paraspinal hemorrhage. 2.  Possible acute fracture involving the inferior endplate of L4 on the right. 3.  Stable T12 and L2 compression deformity status post T12 vertebroplasty. 4.  Stable multilevel spondylosis and resulting spinal stenosis.  Original Report Authenticated By: Gerrianne Scale, M.D.   .   Other results: EKG: no ekg.  Assessment & Plan by Problem: Patient Active Hospital Problem List: 1.Closed left ankle fracture (05/19/2011)  Assessment &  Plan: Pain control, ortho was already consult by Ed physician.   2.Sciatica : We will continue prior prednisone tapered.  3.PAF (paroxysmal atrial fibrillation) rate control, continue amiodarone and bet-blockers. Hold coumadin for now incase surgery is needed, can resume after ortho Consult. Monitor Hb.   4.HTN (hypertension)continue home meds.  5.Hypothyroidism, no change continue home meds.  6.Near syncope, will cycle cardiac enzymes,nuero exam non-focal, monitor on telemetry. Unable to due orthostatics due pt is non weight bearing  REGALADO,BELKYS 05/19/2011, 6:42 PM

## 2011-05-19 NOTE — ED Notes (Signed)
O2 @[facility]  applied for support after giving Dilaudid. Pt resting supine in stretcher. Husband at bedside. NAD at this time.

## 2011-05-20 ENCOUNTER — Inpatient Hospital Stay (HOSPITAL_COMMUNITY): Payer: Medicare Other

## 2011-05-20 LAB — CARDIAC PANEL(CRET KIN+CKTOT+MB+TROPI)
Relative Index: INVALID (ref 0.0–2.5)
Total CK: 26 U/L (ref 7–177)

## 2011-05-20 LAB — COMPREHENSIVE METABOLIC PANEL WITH GFR
ALT: 17 U/L (ref 0–35)
AST: 13 U/L (ref 0–37)
Albumin: 2.6 g/dL — ABNORMAL LOW (ref 3.5–5.2)
Alkaline Phosphatase: 59 U/L (ref 39–117)
BUN: 24 mg/dL — ABNORMAL HIGH (ref 6–23)
CO2: 25 meq/L (ref 19–32)
Calcium: 8.7 mg/dL (ref 8.4–10.5)
Chloride: 103 meq/L (ref 96–112)
Creatinine, Ser: 0.61 mg/dL (ref 0.50–1.10)
GFR calc Af Amer: >60 mL/min (ref >60)
GFR calc non Af Amer: >60 mL/min (ref >60)
Glucose, Bld: 107 mg/dL — ABNORMAL HIGH (ref 70–99)
Potassium: 4.1 meq/L (ref 3.5–5.1)
Sodium: 135 meq/L (ref 135–145)
Total Bilirubin: 0.3 mg/dL (ref 0.3–1.2)
Total Protein: 5.1 g/dL — ABNORMAL LOW (ref 6.0–8.3)

## 2011-05-20 LAB — URINALYSIS, ROUTINE W REFLEX MICROSCOPIC
Bilirubin Urine: NEGATIVE
Glucose, UA: NEGATIVE mg/dL
Hgb urine dipstick: NEGATIVE
Ketones, ur: NEGATIVE mg/dL
Leukocytes, UA: NEGATIVE
Nitrite: NEGATIVE
Protein, ur: NEGATIVE mg/dL
Specific Gravity, Urine: 1.02 (ref 1.005–1.030)
Urobilinogen, UA: 0.2 mg/dL (ref 0.0–1.0)
pH: 6 (ref 5.0–8.0)

## 2011-05-20 LAB — CBC
HCT: 36.5 % (ref 36.0–46.0)
Hemoglobin: 11.9 g/dL — ABNORMAL LOW (ref 12.0–15.0)
MCHC: 32.6 g/dL (ref 30.0–36.0)
MCV: 91 fL (ref 78.0–100.0)
RDW: 15.2 % (ref 11.5–15.5)

## 2011-05-20 LAB — PROTIME-INR
INR: 2.62 — ABNORMAL HIGH (ref 0.00–1.49)
Prothrombin Time: 28.4 s — ABNORMAL HIGH (ref 11.6–15.2)

## 2011-05-20 MED ORDER — SODIUM CHLORIDE 0.9 % IN NEBU
INHALATION_SOLUTION | RESPIRATORY_TRACT | Status: AC
  Administered 2011-05-20: 3 mL
  Filled 2011-05-20: qty 3

## 2011-05-20 MED ORDER — HYDROCODONE-ACETAMINOPHEN 5-325 MG PO TABS
1.5000 | ORAL_TABLET | Freq: Four times a day (QID) | ORAL | Status: DC | PRN
  Administered 2011-05-20 – 2011-05-23 (×6): 1.5 via ORAL
  Filled 2011-05-20 (×6): qty 2

## 2011-05-20 MED ORDER — PREDNISONE 20 MG PO TABS
20.0000 mg | ORAL_TABLET | Freq: Once | ORAL | Status: AC
  Administered 2011-05-20: 20 mg via ORAL
  Filled 2011-05-20: qty 1

## 2011-05-20 MED ORDER — WARFARIN SODIUM 2 MG PO TABS
2.0000 mg | ORAL_TABLET | Freq: Every day | ORAL | Status: DC
  Administered 2011-05-20 – 2011-05-22 (×3): 2 mg via ORAL
  Filled 2011-05-20 (×3): qty 1

## 2011-05-20 NOTE — Consult Note (Signed)
ANTICOAGULATION CONSULT NOTE - Initial Consult  Pharmacy Consult for Coumadin Indication: atrial fibrillation  No Known Allergies  Patient Measurements: Height: 5\' 5"  (165.1 cm) Weight: 161 lb 9.6 oz (73.3 kg) IBW/kg (Calculated) : 57   Vital Signs: Temp: 97.9 F (36.6 C) (09/16 0622) Temp src: Oral (09/16 0622) BP: 130/76 mmHg (09/16 0622) Pulse Rate: 64  (09/16 0622)  Labs:  Basename 05/20/11 0438 05/20/11 0436 05/19/11 2107 05/19/11 1650 05/18/11 0518  HGB 11.9* -- -- 13.6 --  HCT 36.5 -- -- 41.7 39.8  PLT 241 -- -- 273 264  APTT -- -- -- -- --  LABPROT 28.4* -- -- 29.1* 33.8*  INR 2.62* -- -- 2.70* 3.27*  HEPARINUNFRC -- -- -- -- --  CREATININE 0.61 -- -- 0.67 --  CRCLEARANCE -- -- -- -- --  CKTOTAL -- 26 38 -- --  CKMB -- 1.8 2.0 -- --  TROPONINI -- <0.30 <0.30 -- --   Medical History: Past Medical History  Diagnosis Date  . Hypertension   . High cholesterol   . Arthritis   . Hypothyroidism   . Coronary artery disease   . Arrhythmia     pacemaker  . Glaucoma    Medications:  Scheduled:    . albuterol  2.5 mg Nebulization Q6H  . amiodarone  200 mg Oral Daily  . amLODipine  5 mg Oral Daily  . bimatoprost  1 drop Both Eyes QHS  . brimonidine  1 drop Both Eyes BID  . dorzolamide-timolol  1 drop Both Eyes BID  . gabapentin  300 mg Oral TID  .  HYDROmorphone (DILAUDID) injection  1 mg Intravenous Once  . levothyroxine  100 mcg Oral Daily  . metoprolol  50 mg Oral Daily  . ondansetron  4 mg Intravenous Once  . polyethylene glycol  17 g Oral Daily  . predniSONE  20 mg Oral NOW  . predniSONE  20 mg Oral PC lunch  . simvastatin  10 mg Oral q1800  . warfarin  2 mg Oral q1800  . DISCONTD: metoprolol  50 mg Oral Daily   Assessment: Home dose reportedly 2.5mg  daily except 1.25mg  on Wed INR therapeutic  Goal of Therapy:  INR 2-3   Plan: Coumadin 2mg  daily for now INR daily and adjust if needed.  Valrie Hart A 05/20/2011,8:02 AM

## 2011-05-20 NOTE — Progress Notes (Signed)
Subjective: Reviewed chart. Saw and examined patient at bedside. Nice 77 year old female with multiple comorbids, recently hospitalized, who fell at home, sustaining a left malleolus fracture. I called Dr Romeo Apple today regarding patient. He will graciously see her later. She c/o pain when she voids urine, and also has had urinary frequency and occasional cough. The ankle pain is controlled.  Objective: Vital signs in last 24 hours: Temp:  [97.8 F (36.6 C)-98.6 F (37 C)] 97.9 F (36.6 C) (09/16 0622) Pulse Rate:  [58-68] 64  (09/16 0622) Resp:  [18] 18  (09/16 0622) BP: (130-153)/(65-79) 130/76 mmHg (09/16 0622) SpO2:  [92 %-100 %] 96 % (09/16 0622) Weight:  [73.3 kg (161 lb 9.6 oz)] 161 lb 9.6 oz (73.3 kg) (09/15 1901) Weight change:  Last BM Date: 05/19/11  Intake/Output from previous day: 09/15 0701 - 09/16 0700 In: 240 [P.O.:240] Out: 500 [Urine:500] Intake/Output this shift: Total I/O In: 240 [P.O.:240] Out: -   Elderly female, seems exhausted. HEENT- no jvd. RS- lungs clear. CVS- S1S2. RRR. No murmurs. CNS- grossly intact. Abdomen- benign. Extremities- left foot and ankle in splinter.  Lab Results:  Basename 05/20/11 0438 05/19/11 1650  WBC 10.3 15.0*  HGB 11.9* 13.6  HCT 36.5 41.7  PLT 241 273   BMET  Basename 05/20/11 0438 05/19/11 1650  NA 135 135  K 4.1 4.1  CL 103 100  CO2 25 28  GLUCOSE 107* 103*  BUN 24* 26*  CREATININE 0.61 0.67  CALCIUM 8.7 9.5    Studies/Results: Dg Ankle Complete Left  05/19/2011  *RADIOLOGY REPORT*  Clinical Data: Larey Seat yesterday.  Lateral ankle pain.  LEFT ANKLE COMPLETE - 3+ VIEW  Comparison: None  Findings: There is diffuse soft tissue swelling about the ankle, especially notable in the lateral aspect.  There is a fracture of the lateral malleolus.  This is associated minimal displacement. No other fractures are identified.  No radiopaque foreign body or soft tissue gas.  IMPRESSION:  1.  Lateral malleolar fracture. 2.   Significant soft tissue swelling.  Original Report Authenticated By: Patterson Hammersmith, M.D.    Medications: medication reviewed.  Assessment/Plan: 1. Left lateral malleolar fracture- Dr Romeo Apple will see. Continue pain control. cxr preop, in case surgery. If surgery, would ask cardiology input. 2. AFB- rate controlled. INR therapeutic. S/p pacemaker. 3. Leukocytosis- ? Steroid related, check cxr/ua. 4. DJD involving lumbar spine- pain control. 5. Dehydration- Improving.  LOS: 1 day   [name] 05/20/2011, 2:53 PM

## 2011-05-21 LAB — BASIC METABOLIC PANEL
BUN: 25 mg/dL — ABNORMAL HIGH (ref 6–23)
CO2: 26 mEq/L (ref 19–32)
Chloride: 105 mEq/L (ref 96–112)
GFR calc non Af Amer: >60 mL/min (ref >60)
Glucose, Bld: 130 mg/dL — ABNORMAL HIGH (ref 70–99)
Potassium: 4.3 mEq/L (ref 3.5–5.1)
Sodium: 137 mEq/L (ref 135–145)

## 2011-05-21 LAB — CBC
HCT: 38.4 % (ref 36.0–46.0)
Hemoglobin: 12.5 g/dL (ref 12.0–15.0)
MCHC: 32.6 g/dL (ref 30.0–36.0)
RBC: 4.21 MIL/uL (ref 3.87–5.11)
WBC: 10.9 10*3/uL — ABNORMAL HIGH (ref 4.0–10.5)

## 2011-05-21 LAB — PROTIME-INR
INR: 2.43 — ABNORMAL HIGH (ref 0.00–1.49)
Prothrombin Time: 26.8 seconds — ABNORMAL HIGH (ref 11.6–15.2)

## 2011-05-21 MED ORDER — PREDNISONE 20 MG PO TABS
20.0000 mg | ORAL_TABLET | Freq: Once | ORAL | Status: AC
  Administered 2011-05-21: 20 mg via ORAL
  Filled 2011-05-21: qty 1

## 2011-05-21 MED ORDER — SODIUM CHLORIDE 0.9 % IJ SOLN
INTRAMUSCULAR | Status: AC
  Filled 2011-05-21: qty 10

## 2011-05-21 MED ORDER — DEXTROSE 5 % IV SOLN
1.0000 g | INTRAVENOUS | Status: DC
  Administered 2011-05-21 – 2011-05-23 (×3): 1 g via INTRAVENOUS
  Filled 2011-05-21 (×4): qty 1

## 2011-05-21 NOTE — Consult Note (Signed)
Reason for Consult:LEFT ANKLE FRACTURE  Referring Physician: PMD  [name] is an 77 y.o. female.  HPI: WENT HOME LEFT LEG GAVE OUT FRACTURED LEFT ANKLE   C/O MILD NON RAD LEFT ANKLE PAIN AND SWELLING/DULL ACHE   Past Medical History  Diagnosis Date  . Hypertension   . High cholesterol   . Arthritis   . Hypothyroidism   . Coronary artery disease   . Arrhythmia     pacemaker  . Glaucoma     Past Surgical History  Procedure Date  . Total hip arthroplasty     right side  . Pacemaker insertion   . Appendectomy   . Abdominal hysterectomy     History reviewed. No pertinent family history.  Social History:  reports that she has never smoked. She does not have any smokeless tobacco history on file. She reports that she does not drink alcohol or use illicit drugs.  Allergies: No Known Allergies  Medications: I have reviewed the patient's current medications.  Results for orders placed during the hospital encounter of 05/19/11 (from the past 48 hour(s))  CARDIAC PANEL(CRET KIN+CKTOT+MB+TROPI)     Status: Normal   Collection Time   05/19/11  9:07 PM      Component Value Range Comment   Total CK 38  7 - 177 (U/L)    CK, MB 2.0  0.3 - 4.0 (ng/mL)    Troponin I <0.30  <0.30 (ng/mL)    Relative Index RELATIVE INDEX IS INVALID  0.0 - 2.5    CARDIAC PANEL(CRET KIN+CKTOT+MB+TROPI)     Status: Normal   Collection Time   05/20/11  4:36 AM      Component Value Range Comment   Total CK 26  7 - 177 (U/L)    CK, MB 1.8  0.3 - 4.0 (ng/mL)    Troponin I <0.30  <0.30 (ng/mL)    Relative Index RELATIVE INDEX IS INVALID  0.0 - 2.5    CBC     Status: Abnormal   Collection Time   05/20/11  4:38 AM      Component Value Range Comment   WBC 10.3  4.0 - 10.5 (K/uL)    RBC 4.01  3.87 - 5.11 (MIL/uL)    Hemoglobin 11.9 (*) 12.0 - 15.0 (g/dL)    HCT 16.1  09.6 - 04.5 (%)    MCV 91.0  78.0 - 100.0 (fL)    MCH 29.7  26.0 - 34.0 (pg)    MCHC 32.6  30.0 - 36.0 (g/dL)    RDW 40.9  81.1 -  91.4 (%)    Platelets 241  150 - 400 (K/uL)   PROTIME-INR     Status: Abnormal   Collection Time   05/20/11  4:38 AM      Component Value Range Comment   Prothrombin Time 28.4 (*) 11.6 - 15.2 (seconds)    INR 2.62 (*) 0.00 - 1.49    COMPREHENSIVE METABOLIC PANEL     Status: Abnormal   Collection Time   05/20/11  4:38 AM      Component Value Range Comment   Sodium 135  135 - 145 (mEq/L)    Potassium 4.1  3.5 - 5.1 (mEq/L)    Chloride 103  96 - 112 (mEq/L)    CO2 25  19 - 32 (mEq/L)    Glucose, Bld 107 (*) 70 - 99 (mg/dL)    BUN 24 (*) 6 - 23 (mg/dL)    Creatinine, Ser 7.82  0.50 -  1.10 (mg/dL)    Calcium 8.7  8.4 - 10.5 (mg/dL)    Total Protein 5.1 (*) 6.0 - 8.3 (g/dL)    Albumin 2.6 (*) 3.5 - 5.2 (g/dL)    AST 13  0 - 37 (U/L)    ALT 17  0 - 35 (U/L)    Alkaline Phosphatase 59  39 - 117 (U/L)    Total Bilirubin 0.3  0.3 - 1.2 (mg/dL)    GFR calc non Af Amer >60  >60 (mL/min)    GFR calc Af Amer >60  >60 (mL/min)   CARDIAC PANEL(CRET KIN+CKTOT+MB+TROPI)     Status: Normal   Collection Time   05/20/11  1:30 PM      Component Value Range Comment   Total CK 30  7 - 177 (U/L)    CK, MB 1.6  0.3 - 4.0 (ng/mL)    Troponin I <0.30  <0.30 (ng/mL)    Relative Index RELATIVE INDEX IS INVALID  0.0 - 2.5    URINALYSIS, ROUTINE W REFLEX MICROSCOPIC     Status: Abnormal   Collection Time   05/20/11  5:44 PM      Component Value Range Comment   Color, Urine YELLOW  YELLOW     Appearance CLOUDY (*) CLEAR     Specific Gravity, Urine 1.020  1.005 - 1.030     pH 6.0  5.0 - 8.0     Glucose, UA NEGATIVE  NEGATIVE (mg/dL)    Hgb urine dipstick NEGATIVE  NEGATIVE     Bilirubin Urine NEGATIVE  NEGATIVE     Ketones, ur NEGATIVE  NEGATIVE (mg/dL)    Protein, ur NEGATIVE  NEGATIVE (mg/dL)    Urobilinogen, UA 0.2  0.0 - 1.0 (mg/dL)    Nitrite NEGATIVE  NEGATIVE     Leukocytes, UA NEGATIVE  NEGATIVE  MICROSCOPIC NOT DONE ON URINES WITH NEGATIVE PROTEIN, BLOOD, LEUKOCYTES, NITRITE, OR GLUCOSE <1000  mg/dL.  PROTIME-INR     Status: Abnormal   Collection Time   05/21/11  4:09 AM      Component Value Range Comment   Prothrombin Time 26.8 (*) 11.6 - 15.2 (seconds)    INR 2.43 (*) 0.00 - 1.49    CBC     Status: Abnormal   Collection Time   05/21/11  4:09 AM      Component Value Range Comment   WBC 10.9 (*) 4.0 - 10.5 (K/uL)    RBC 4.21  3.87 - 5.11 (MIL/uL)    Hemoglobin 12.5  12.0 - 15.0 (g/dL)    HCT 40.9  81.1 - 91.4 (%)    MCV 91.2  78.0 - 100.0 (fL)    MCH 29.7  26.0 - 34.0 (pg)    MCHC 32.6  30.0 - 36.0 (g/dL)    RDW 78.2  95.6 - 21.3 (%)    Platelets 250  150 - 400 (K/uL)   BASIC METABOLIC PANEL     Status: Abnormal   Collection Time   05/21/11  4:09 AM      Component Value Range Comment   Sodium 137  135 - 145 (mEq/L)    Potassium 4.3  3.5 - 5.1 (mEq/L)    Chloride 105  96 - 112 (mEq/L)    CO2 26  19 - 32 (mEq/L)    Glucose, Bld 130 (*) 70 - 99 (mg/dL)    BUN 25 (*) 6 - 23 (mg/dL)    Creatinine, Ser 0.86  0.50 - 1.10 (mg/dL)  Calcium 8.5  8.4 - 10.5 (mg/dL)    GFR calc non Af Amer >60  >60 (mL/min)    GFR calc Af Amer >60  >60 (mL/min)     Dg Chest Portable 1 View  05/20/2011  *RADIOLOGY REPORT*  Clinical Data: Preop  PORTABLE CHEST - 1 VIEW  Comparison: 12/19/2010  Findings: Lungs are essentially clear. No pleural effusion or pneumothorax.  Stable cardiomegaly.  Left subclavian pacemaker.  IMPRESSION: No evidence of acute cardiopulmonary disease.  Stable cardiomegaly.  Original Report Authenticated By: Charline Bills, M.D.    ROS BACK PAIN RIGHT LEG PAIN IMPROVED  Blood pressure 153/89, pulse 65, temperature 97.5 F (36.4 C), temperature source Oral, resp. rate 20, height 5\' 5"  (1.651 m), weight 73.3 kg (161 lb 9.6 oz), SpO2 97.00%. Physical Exam  Assessment/Plan: LEFT ANKLE FRACTURE   BOOT X 6 WEEKS   F/U IN 6 WEEKS  WBAT   CHECK SKIN DAILY   Fuller Canada 05/21/2011, 5:38 PM

## 2011-05-21 NOTE — Progress Notes (Signed)
Subjective: Feels better. Her son is not sure if she can manage at home. Leg pain controlled. Objective: Vital signs in last 24 hours: Temp:  [97.4 F (36.3 C)-98 F (36.7 C)] 97.5 F (36.4 C) (09/17 0619) Pulse Rate:  [63-65] 65  (09/17 0619) Resp:  [20] 20  (09/17 0619) BP: (111-153)/(57-89) 153/89 mmHg (09/17 0619) SpO2:  [92 %-100 %] 99 % (09/17 0747) Weight change:  Last BM Date: 05/19/11  Intake/Output from previous day: 09/16 0701 - 09/17 0700 In: 565 [P.O.:565] Out: 750 [Urine:750] Intake/Output this shift:    PE- eating breakfast. Not in distress. RS- Lungs Clear. CVS- S1S2. RRR. Abdomen- soft, non tender +BS. Extremities- left leg/foot in boot. Good capillary return peripherally.  Lab Results:  Basename 05/21/11 0409 05/20/11 0438  WBC 10.9* 10.3  HGB 12.5 11.9*  HCT 38.4 36.5  PLT 250 241   BMET  Basename 05/21/11 0409 05/20/11 0438  NA 137 135  K 4.3 4.1  CL 105 103  CO2 26 25  GLUCOSE 130* 107*  BUN 25* 24*  CREATININE 0.67 0.61  CALCIUM 8.5 8.7  UA +ve for bacteria/wbc/rbc. INR therapeutic.  Studies/Results: Dg Ankle Complete Left  05/19/2011  *RADIOLOGY REPORT*  Clinical Data: Larey Seat yesterday.  Lateral ankle pain.  LEFT ANKLE COMPLETE - 3+ VIEW  Comparison: None  Findings: There is diffuse soft tissue swelling about the ankle, especially notable in the lateral aspect.  There is a fracture of the lateral malleolus.  This is associated minimal displacement. No other fractures are identified.  No radiopaque foreign body or soft tissue gas.  IMPRESSION:  1.  Lateral malleolar fracture. 2.  Significant soft tissue swelling.  Original Report Authenticated By: Patterson Hammersmith, M.D.   Dg Chest Portable 1 View  05/20/2011  *RADIOLOGY REPORT*  Clinical Data: Preop  PORTABLE CHEST - 1 VIEW  Comparison: 12/19/2010  Findings: Lungs are essentially clear. No pleural effusion or pneumothorax.  Stable cardiomegaly.  Left subclavian pacemaker.  IMPRESSION: No  evidence of acute cardiopulmonary disease.  Stable cardiomegaly.  Original Report Authenticated By: Charline Bills, M.D.    Medications: Reviewed.  Assessment/Plan: 1.Lateral  Left Malleolar fracture- to be seen by Dr Romeo Apple today. May need short term rehab. If surgical intervention, cards pre op. 2. UTI- responsible for urinary frequency. Add ceftriaxone. 3. AFIB- rate controlled. INR therapeutic. 4. Deconditioning- PT evaluation. 5. DJD- stable, continue current medications.  LOS: 2 days   Charisma Charlot 05/21/2011, 10:03 AM

## 2011-05-21 NOTE — Consult Note (Signed)
ANTICOAGULATION CONSULT NOTE   Pharmacy Consult for Coumadin Indication: atrial fibrillation  No Known Allergies  Patient Measurements: Height: 5\' 5"  (165.1 cm) Weight: 161 lb 9.6 oz (73.3 kg) IBW/kg (Calculated) : 57   Vital Signs: Temp: 97.5 F (36.4 C) (09/17 0619) Temp src: Oral (09/17 0619) BP: 153/89 mmHg (09/17 0619) Pulse Rate: 65  (09/17 0619)  Labs:  Basename 05/21/11 0409 05/20/11 1330 05/20/11 0438 05/20/11 0436 05/19/11 2107 05/19/11 1650  HGB 12.5 -- 11.9* -- -- --  HCT 38.4 -- 36.5 -- -- 41.7  PLT 250 -- 241 -- -- 273  APTT -- -- -- -- -- --  LABPROT 26.8* -- 28.4* -- -- 29.1*  INR 2.43* -- 2.62* -- -- 2.70*  HEPARINUNFRC -- -- -- -- -- --  CREATININE 0.67 -- 0.61 -- -- 0.67  CRCLEARANCE -- -- -- -- -- --  CKTOTAL -- 30 -- 26 38 --  CKMB -- 1.6 -- 1.8 2.0 --  TROPONINI -- <0.30 -- <0.30 <0.30 --   Medical History: Past Medical History  Diagnosis Date  . Hypertension   . High cholesterol   . Arthritis   . Hypothyroidism   . Coronary artery disease   . Arrhythmia     pacemaker  . Glaucoma    Medications:  Scheduled:     . albuterol  2.5 mg Nebulization Q6H  . amiodarone  200 mg Oral Daily  . amLODipine  5 mg Oral Daily  . bimatoprost  1 drop Both Eyes QHS  . brimonidine  1 drop Both Eyes BID  . dorzolamide-timolol  1 drop Both Eyes BID  . gabapentin  300 mg Oral TID  . levothyroxine  100 mcg Oral Daily  . metoprolol  50 mg Oral Daily  . polyethylene glycol  17 g Oral Daily  . predniSONE  20 mg Oral Once  . predniSONE  20 mg Oral PC lunch  . simvastatin  10 mg Oral q1800  . sodium chloride      . warfarin  2 mg Oral q1800   Assessment: Home dose reportedly 2.5mg  daily except 1.25mg  on Wed INR therapeutic  Goal of Therapy:  INR 2-3   Plan: Coumadin 2mg  daily for now INR daily and adjust if needed.  Margo Aye, Mylia Pondexter A 05/21/2011,7:51 AM

## 2011-05-22 LAB — BASIC METABOLIC PANEL
BUN: 23 mg/dL (ref 6–23)
CO2: 25 mEq/L (ref 19–32)
Chloride: 106 mEq/L (ref 96–112)
GFR calc Af Amer: >60 mL/min (ref >60)
Potassium: 4.5 mEq/L (ref 3.5–5.1)

## 2011-05-22 LAB — PROTIME-INR: INR: 2.88 — ABNORMAL HIGH (ref 0.00–1.49)

## 2011-05-22 LAB — CBC
HCT: 38.7 % (ref 36.0–46.0)
RBC: 4.24 MIL/uL (ref 3.87–5.11)
RDW: 15.3 % (ref 11.5–15.5)
WBC: 11.6 10*3/uL — ABNORMAL HIGH (ref 4.0–10.5)

## 2011-05-22 MED ORDER — PREDNISONE 10 MG PO TABS: 5.0000 mg | ORAL_TABLET | Freq: Every day | ORAL | Status: DC

## 2011-05-22 MED ORDER — PREDNISONE 10 MG PO TABS
10.0000 mg | ORAL_TABLET | Freq: Every day | ORAL | Status: DC
  Filled 2011-05-22 (×2): qty 1

## 2011-05-22 MED ORDER — PREDNISONE 20 MG PO TABS
20.0000 mg | ORAL_TABLET | Freq: Every day | ORAL | Status: AC
  Administered 2011-05-22: 20 mg via ORAL
  Filled 2011-05-22: qty 1

## 2011-05-22 MED ORDER — SODIUM CHLORIDE 0.9 % IN NEBU
INHALATION_SOLUTION | RESPIRATORY_TRACT | Status: AC
  Administered 2011-05-22: 3 mL
  Filled 2011-05-22: qty 3

## 2011-05-22 NOTE — Progress Notes (Signed)
Physical Therapy Evaluation Patient Name: [name]'W Date: 05/22/2011 Problem List:  Patient Active Problem List  Diagnoses  . Chronic pain syndrome  . ARTHRITIS, LUMBOSACRAL SPINE  . Sciatica  . PAF (paroxysmal atrial fibrillation)  . Sick sinus syndrome  . HTN (hypertension)  . Hypothyroidism  . Osteoporosis  . Closed left ankle fracture  . Near syncope   Past Medical History:  Past Medical History  Diagnosis Date  . Hypertension   . High cholesterol   . Arthritis   . Hypothyroidism   . Coronary artery disease   . Arrhythmia     pacemaker  . Glaucoma    Past Surgical History:  Past Surgical History  Procedure Date  . Total hip arthroplasty     right side  . Pacemaker insertion   . Appendectomy   . Abdominal hysterectomy     Precautions/Restrictions  Precautions Precautions: Fall Required Braces or Orthoses: Yes Other Brace/Splint: L ankle brace Restrictions Weight Bearing Restrictions: Yes LLE Weight Bearing: Weight bearing as tolerated Prior Functioning  Home Living Type of Home: House Lives With: Spouse Receives Help From: Family Home Layout: One level Home Access: Stairs to enter Entrance Stairs-Rails: Right Entrance Stairs-Number of Steps: 5- church is going to build her a ramp. Bathroom Shower/Tub: Engineer, manufacturing systems: Handicapped height Home Adaptive Equipment: Walker - rolling;Bedside commode/3-in-1;Raised toilet seat with rails Prior Function Level of Independence: Independent with basic ADLs Driving: Yes Vocation: Retired Financial risk analyst Arousal/Alertness: Awake/alert Overall Cognitive Status: Appears within functional limits for tasks assessed Sensation/Coordination Coordination Gross Motor Movements are Fluid and Coordinated: No Fine Motor Movements are Fluid and Coordinated: Not tested Extremity Assessment   Mobility (including Balance) Bed Mobility Bed Mobility: Yes Rolling Right: 4: Min  assist Right Sidelying to Sit: 3: Mod assist Sitting - Scoot to Edge of Bed: 5: Supervision;7: Independent Transfers Transfers: Yes Sit to Stand: 3: Mod assist Stand to Sit: 5: Supervision Stand Pivot Transfers: 3: Mod assist Ambulation/Gait Ambulation/Gait: No Stairs: No    Exercise     End of Session PT - End of Session Equipment Utilized During Treatment: Gait belt Activity Tolerance: Patient tolerated treatment well Patient left: in bed;with call bell in reach;with family/visitor present General Behavior During Session: [facility] for tasks performed Cognition: [facility] for tasks performed PT Assessment/Plan/Recommendation PT Assessment Clinical Impression Statement: Pt with significant decreased mobility after pt has had two falls one resulting in compression fractures and one resulting in a left fracture ankle who will need skilled physical therapy to improve functional mobility and improved safety. PT Recommendation/Assessment: Patient will need skilled PT in the acute care venue PT Problem List: Decreased strength;Decreased activity tolerance;Pain PT Therapy Diagnosis : Difficulty walking;Generalized weakness;Acute pain PT Plan PT Frequency: Min 5X/week PT Treatment/Interventions: Gait training;Therapeutic exercise;Functional mobility training PT Recommendation Follow Up Recommendations: Skilled nursing facility Equipment Recommended: None recommended by PT PT Goals  Acute Rehab PT Goals PT Goal Formulation: With patient/family Time For Goal Achievement: 4 days Pt will go Supine/Side to Sit: with modified independence Pt will Transfer Sit to Stand/Stand to Sit: with min assist Pt will Stand: 3 - 5 min;with min assist Pt will Ambulate: 16 - 50 feet;with rolling walker;with supervision RUSSELL,CINDY 05/22/2011, 12:46 PM

## 2011-05-22 NOTE — Progress Notes (Signed)
Subjective: Feels ok. No complaints.  Objective: Vital signs in last 24 hours: Temp:  [97.5 F (36.4 C)-97.8 F (36.6 C)] 97.5 F (36.4 C) (09/18 0524) Pulse Rate:  [65] 65  (09/18 0524) Resp:  [20] 20  (09/18 0524) BP: (103-161)/(61-88) 161/88 mmHg (09/18 0524) SpO2:  [95 %-99 %] 96 % (09/18 1405) Weight change:  Last BM Date: 05/21/11  Intake/Output from previous day: 09/17 0701 - 09/18 0700 In: 700 [P.O.:100; I.V.:600] Out: 500 [Urine:500] Intake/Output this shift:    PE Sitting up in bed. Not in distress. HEENT- No JVD. RS- lungs clear. CVS- S1S2 heard. No murmurs. Extremities- left foot and leg in boot. CNS- grossly intact. Abdomen-benign.  Lab Results:  Basename 05/22/11 0528 05/21/11 0409  WBC 11.6* 10.9*  HGB 12.5 12.5  HCT 38.7 38.4  PLT 240 250   BMET  Basename 05/22/11 0528 05/21/11 0409  NA 137 137  K 4.5 4.3  CL 106 105  CO2 25 26  GLUCOSE 128* 130*  BUN 23 25*  CREATININE 0.63 0.67  CALCIUM 8.9 8.5    Studies/Results: No results found.  Medications: medication reviewed  Assessment [name] is a pleasant 77 year old female admitted after a fall, not many hours after discharge from the hospital. She developed a left lateral malleolus fracture, deemed non surgical by Dr Romeo Apple. She will need to wear "the boot" for 6/52. She was also incidentally found to have a UTI and a mild  Exacerbation of COPD for which she is on a steroid taper. She maybe able to d/c to SNF ?tomorrow. Plan: 1. Left lateral malleolar fracture after a fall- Dr Romeo Apple appreciated. For SNF. Six weeks of boot per ortho instruction. 2. UTI- on ceftriaxone to complete 3-5 days of abx. 3. AFIB on coumadin- rate controlled. INR therapeutic. 4. Deconditioning- for SNF.    LOS: 3 days   [name] 05/22/2011, 7:53 PM

## 2011-05-22 NOTE — Consult Note (Signed)
CSW presented bed offers and pt and pt's husband choose [facility]. Facility notified. Planned d/c for tomorrow per MD.  Karn Cassis

## 2011-05-22 NOTE — Progress Notes (Signed)
UR Chart Review Completed  

## 2011-05-23 ENCOUNTER — Inpatient Hospital Stay
Admission: RE | Admit: 2011-05-23 | Discharge: 2011-06-03 | Disposition: A | Discharge status: Still In Hospital | Payer: 59 | Source: Ambulatory Visit | Attending: Internal Medicine | Admitting: Internal Medicine

## 2011-05-23 DIAGNOSIS — R609 Edema, unspecified: Principal | ICD-10-CM

## 2011-05-23 DIAGNOSIS — R062 Wheezing: Secondary | ICD-10-CM

## 2011-05-23 LAB — PROTIME-INR: Prothrombin Time: 30.4 seconds — ABNORMAL HIGH (ref 11.6–15.2)

## 2011-05-23 MED ORDER — HYDROCODONE-ACETAMINOPHEN 5-325 MG PO TABS: 1.5000 | ORAL_TABLET | Freq: Four times a day (QID) | ORAL | Status: AC | PRN

## 2011-05-23 MED ORDER — WARFARIN SODIUM 2 MG PO TABS: 2.0000 mg | ORAL_TABLET | Freq: Every day | ORAL | Status: DC

## 2011-05-23 NOTE — Progress Notes (Signed)
Physical Therapy Treatment Patient Name: [name] Date: 05/23/2011 Problem List:  Patient Active Problem List  Diagnoses  . Chronic pain syndrome  . ARTHRITIS, LUMBOSACRAL SPINE  . Sciatica  . PAF (paroxysmal atrial fibrillation)  . Sick sinus syndrome  . HTN (hypertension)  . Hypothyroidism  . Osteoporosis  . Closed left ankle fracture  . Near syncope   Past Medical History:  Past Medical History  Diagnosis Date  . Hypertension   . High cholesterol   . Arthritis   . Hypothyroidism   . Coronary artery disease   . Arrhythmia     pacemaker  . Glaucoma    Past Surgical History:  Past Surgical History  Procedure Date  . Total hip arthroplasty     right side  . Pacemaker insertion   . Appendectomy   . Abdominal hysterectomy    Precautions/Restrictions  Precautions Precautions: Fall Required Braces or Orthoses: Yes Other Brace/Splint: L ankle in CAM boot Restrictions Weight Bearing Restrictions: Yes LLE Weight Bearing: Weight bearing as tolerated Mobility (including Balance) Bed Mobility Bed Mobility: Yes Right Sidelying to Sit: 4: Min assist Right Sidelying to Sit Details (indicate cue type and reason): HOB 40 degrees patient using bedrail, therapist assisting left foot and hips with pad to get EOB.   Sitting - Scoot to [place] of Bed: 4: Min assist Sitting - Scoot to [place] of Bed Details (indicate cue type and reason): using drawpad to assist with weight shifting hips Transfers Transfers: Yes Sit to Stand: 3: Mod assist Sit to Stand Details (indicate cue type and reason): to support trunk over weak and painful legs Stand to Sit: 3: Mod assist Stand to Sit Details: to help control descent to sit into lower recliner chair.   Stand Pivot Transfers: 3: Mod assist Stand Pivot Transfer Details (indicate cue type and reason): to support trunk over weak and painful knees, from elevated bed to lower recliner chair on patient's right side.   Ambulation/Gait Ambulation/Gait: No Stairs: No    Exercise  Total Joint Exercises Hip ABduction/ADduction: AROM;Both;10 reps;Seated;Other (comment) (squeezing pillow between knees.  ) Long Arc Quad: AROM;Both;10 reps General Exercises - Upper Extremity Shoulder Flexion: AROM;Both;10 reps;Seated General Exercises - Lower Extremity Long Arc Quad: AROM;Both;10 reps Hip ABduction/ADduction: AROM;Both;10 reps;Seated;Other (comment) (squeezing pillow between knees.  ) Hip Flexion/Marching: AROM;Both;10 reps;Seated Low Level/ICU Exercises Hip ABduction/ADduction: AROM;Both;10 reps;Seated;Other (comment) (squeezing pillow between knees.  ) Shoulder Flexion: AROM;Both;10 reps;Seated  End of Session PT - End of Session Equipment Utilized During Treatment: Gait belt Activity Tolerance: Patient tolerated treatment well Patient left: in chair Nurse Communication: Mobility status for transfers (and that patient is going to try to be OOB in chair x 1 hr) General Behavior During Session: [facility] for tasks performed Cognition: [facility] for tasks performed PT Assessment/Plan  PT - Assessment/Plan Comments on Treatment Session: patient is having significant sciatic nerve pain in her right leg which is more painful than her left ankle.   PT Plan: Discharge plan remains appropriate PT Frequency: Min 5X/week Follow Up Recommendations: Skilled nursing facility Equipment Recommended: Defer to next venue PT Goals  Acute Rehab PT Goals Pt will go Supine/Side to Sit: with modified independence PT Goal: Supine/Side to Sit - Progress: Progressing toward goal Pt will Transfer Sit to Stand/Stand to Sit: with min assist PT Transfer Goal: Sit to Stand/Stand to Sit - Progress: Progressing toward goal Pt will Stand: with min assist;3 - 5 min PT Goal: Stand - Progress: Progressing toward goal Pt will  Ambulate: 16 - 50 feet;with supervision;with rolling walker PT Goal: Ambulate - Progress: Not met  [name] 05/23/2011, 11:48 AM

## 2011-05-23 NOTE — Plan of Care (Signed)
Problem: Phase II Progression Outcomes Goal: Progress activity as tolerated unless otherwise ordered Outcome: Progressing PT Evaluation complete. See PT notes for details.   Goal: Discharge plan established Outcome: Progressing D/C to SNF for rehab

## 2011-05-23 NOTE — Consult Note (Signed)
ANTICOAGULATION CONSULT NOTE   Pharmacy Consult for Coumadin Indication: atrial fibrillation  No Known Allergies  Patient Measurements: Height: 5\' 5"  (165.1 cm) Weight: 161 lb 9.6 oz (73.3 kg) IBW/kg (Calculated) : 57   Vital Signs: Temp: 98.1 F (36.7 C) (09/19 0629) Temp src: Oral (09/18 2143) BP: 126/78 mmHg (09/19 0629) Pulse Rate: 65  (09/19 0629)  Labs:  Basename 05/23/11 0507 05/22/11 0528 05/21/11 0409 05/20/11 1330  HGB -- 12.5 12.5 --  HCT -- 38.7 38.4 --  PLT -- 240 250 --  APTT -- -- -- --  LABPROT 30.4* 30.6* 26.8* --  INR 2.85* 2.88* 2.43* --  HEPARINUNFRC -- -- -- --  CREATININE -- 0.63 0.67 --  CRCLEARANCE -- -- -- --  CKTOTAL -- -- -- 30  CKMB -- -- -- 1.6  TROPONINI -- -- -- <0.30   Medical History: Past Medical History  Diagnosis Date  . Hypertension   . High cholesterol   . Arthritis   . Hypothyroidism   . Coronary artery disease   . Arrhythmia     pacemaker  . Glaucoma    Medications:  Scheduled:     . albuterol  2.5 mg Nebulization Q6H  . amiodarone  200 mg Oral Daily  . amLODipine  5 mg Oral Daily  . bimatoprost  1 drop Both Eyes QHS  . brimonidine  1 drop Both Eyes BID  . cefTRIAXone (ROCEPHIN) IV  1 g Intravenous Q24H  . dorzolamide-timolol  1 drop Both Eyes BID  . gabapentin  300 mg Oral TID  . levothyroxine  100 mcg Oral Daily  . metoprolol  50 mg Oral Daily  . polyethylene glycol  17 g Oral Daily  . predniSONE  20 mg Oral Daily   Followed by  . predniSONE  10 mg Oral Daily   Followed by  . predniSONE  5 mg Oral Daily  . simvastatin  10 mg Oral q1800  . warfarin  2 mg Oral q1800  . DISCONTD: predniSONE  20 mg Oral PC lunch   Assessment: Home dose reportedly 2.5mg  daily except 1.25mg  on Wed INR therapeutic and stable  Goal of Therapy:  INR 2-3   Plan: Coumadin 2mg  daily  INR every Mon-Wed-Fri  Valrie Hart A 05/23/2011,7:44 AM

## 2011-05-23 NOTE — Consult Note (Signed)
Pt d/c today by MD to [facility]. Pt, family, and facility aware and agreeable. Pt to transfer with RN.  Karn Cassis

## 2011-05-23 NOTE — Discharge Summary (Signed)
Physician Discharge Summary  Patient ID: [name] MRN: [id number] DOB/AGE: 1926-12-07 77 y.o. Primary Care Physician:FUSCO,LAWRENCE J., MD Admit date: 05/19/2011 Discharge date: 05/23/2011    Discharge Diagnoses:  1. Closed left ankle fracture. 2. Sciatica. 3. Paroxysmal atrial fibrillation, on Coumadin. 4. Hypertension. 5. Hypothyroidism. 5. Osteoporosis. 6. Chronic pain syndrome.   Current Discharge Medication List    CONTINUE these medications which have CHANGED   Details  HYDROcodone-acetaminophen (NORCO) 5-325 MG per tablet Take 1.5 tablets by mouth every 6 (six) hours as needed. Qty: 30 tablet, Refills: 0   Associated Diagnoses: Pain    warfarin (COUMADIN) 2 MG tablet Take 1 tablet (2 mg total) by mouth daily at 6 PM. Qty: 30 tablet, Refills: 0      CONTINUE these medications which have NOT CHANGED   Details  ALPHAGAN P 0.1 % SOLN Place 1 drop into both eyes 2 (two) times daily.     amiodarone (PACERONE) 200 MG tablet Take 200 mg by mouth daily.     amLODipine (NORVASC) 5 MG tablet     dorzolamide-timolol (COSOPT) 22.3-6.8 MG/ML ophthalmic solution Place 1 drop into both eyes 2 (two) times daily.     ESTRACE VAGINAL 0.1 MG/GM vaginal cream Place 2 g vaginally daily as needed.     gabapentin (NEURONTIN) 300 MG capsule Take 1 capsule (300 mg total) by mouth 3 (three) times daily.    lidocaine (LIDODERM) 5 % Place 1 patch onto the skin daily. Remove & Discard patch within 12 hours or as directed by MD     LUMIGAN 0.03 % ophthalmic solution Place 1 drop into both eyes at bedtime.     metoprolol (TOPROL-XL) 50 MG 24 hr tablet Take 50 mg by mouth daily.     pravastatin (PRAVACHOL) 20 MG tablet Take 20 mg by mouth daily.     predniSONE (STERAPRED UNI-PAK) 10 MG tablet Take 2 tablets (20 mg total) by mouth PC lunch. Take 3 tablets for 2 days then 2 tablets for  2 days  Then 1 tablet for 1 day then half tablet then stop. Qty: 12 tablet, Refills: 0      senna-docusate (SENOKOT-S) 8.6-50 MG per tablet Take 1 tablet by mouth daily as needed for constipation. Qty: 30 tablet, Refills: 0    SYNTHROID 100 MCG tablet Take 100 mcg by mouth daily.       STOP taking these medications     HYDROcodone-acetaminophen (NORCO) 7.5-325 MG per tablet      polyethylene glycol (MIRALAX / GLYCOLAX) packet         Discharged Condition: Stable and improved.    Consults: Dr. Fuller Canada, orthopedics.  Significant Diagnostic Studies: Dg Lumbar Spine Complete  05/14/2011  *RADIOLOGY REPORT*  Clinical Data: Low back right hip pain, 3-4 days duration, past history of right hip replacement in kyphoplasty  LUMBAR SPINE - COMPLETE 4+ VIEW  Comparison: 04/08/2005 Correlation:  CT lumbar spine 07/01/2006  Findings: Six non-rib bearing lumbar vertebrae. Prior vertebroplasty at the first non-rib bearing segment, grossly stable. This has been labeled as T12 on the prior CT and the same numbering system will be utilized for these radiographs.  Severe osseous demineralization limits exam. Biconvex thoracolumbar scoliosis. Multilevel disc space narrowing endplate spur formation. Marked compression deformity of L3 and superior endplate of L4, progressive since prior CT. Mild focal kyphosis of the thoracolumbar spine at T12. Mild chronic height loss at L2 appears stable. L1 and L5 heights appear maintained. No subluxation or spondylolysis. Facet degenerative  changes lower lumbar spine. Extensive atherosclerotic calcification. Visualized pelvis appears intact.  IMPRESSION: Lumbar numbering as above. Marked osseous demineralization. Prior vertebroplasty T12. Age indeterminate compression fractures of L3 and superior endplate L4, though new since 2007. Multilevel degenerative disc and facet disease changes.  Per CMS PQRS reporting requirements (PQRS Measure 24): Given the patient's age of greater than 50 and the fracture site (hip, distal radius, or spine), the patient should be  tested for osteoporosis using DXA, and the appropriate treatment considered based on the DXA results.  Original Report Authenticated By: Lollie Marrow, M.D.   Dg Hip Complete Right  05/14/2011  *RADIOLOGY REPORT*  Clinical Data: Right leg pain for 3-4 days, history of prior right hip replacement and kyphoplasty  RIGHT HIP - COMPLETE 2+ VIEW  Comparison: 04/26/2004  Findings: Right hip replacement with intact hardware. Minimal periprosthetic lucency at the proximal portion of the femoral shaft component on the frog-leg lateral view. No acute fracture, dislocation or bone destruction. Pelvis appears intact. Scattered atherosclerotic calcifications and pelvic phleboliths.  IMPRESSION: Minimal periprosthetic lucency at the proximal portion of the femoral component of the right hip prosthesis, nonspecific. Significant osseous demineralization. No additional bony abnormalities identified.  Original Report Authenticated By: Lollie Marrow, M.D.   Dg Ankle Complete Left  05/19/2011  *RADIOLOGY REPORT*  Clinical Data: Larey Seat yesterday.  Lateral ankle pain.  LEFT ANKLE COMPLETE - 3+ VIEW  Comparison: None  Findings: There is diffuse soft tissue swelling about the ankle, especially notable in the lateral aspect.  There is a fracture of the lateral malleolus.  This is associated minimal displacement. No other fractures are identified.  No radiopaque foreign body or soft tissue gas.  IMPRESSION:  1.  Lateral malleolar fracture. 2.  Significant soft tissue swelling.  Original Report Authenticated By: Patterson Hammersmith, M.D.   Ct Lumbar Spine Wo Contrast  05/14/2011  *RADIOLOGY REPORT*  Clinical Data: Continued severe back pain.  History of kyphoplasty. No recent injury.  CT LUMBAR SPINE WITHOUT CONTRAST  Technique:  Multidetector CT imaging of the lumbar spine was performed without intravenous contrast administration. Multiplanar CT image reconstructions were also generated.  Comparison: Lumbar spine radiographs 05/14/2011  and lumbar spine CT 07/01/2006.  Findings: There is a slightly progressive convex right scoliosis. The lateral alignment is stable.  There are stable vertebroplasty changes at T12.  Chronic fractures at T12 and L2 are unchanged.  As demonstrated on recent radiographs, there is a progressive superior endplate compression fracture at L3, now resulting in approximately 50% loss of vertebral body height.  There is mild osseous retropulsion. No paraspinal hematoma or discrete cortical fracture is identified.  There is vacuum phenomenon within the L2- L3 disc which is similar to the prior study.  Superior endplate compression fracture at L4 does not appear significantly changed.  However, there is new cortical irregularity of the inferior end plate laterally on the right, most obvious on the coronal images.  The L5 vertebral body appears intact.  Spondylosis is similar to prior studies.  There is stable spinal stenosis at all 02-03 with lateral recess stenosis bilaterally.  At L3-L4, there is narrowing of the right lateral recess and right foramen.  At L4-L5, there is mild to moderate central stenosis with narrowing of both foramina.  IMPRESSION:  1.  Since the prior CT of 07/01/2006, there is a progressive superior endplate compression deformity at L3 with mild osseous retropulsion.  This does not demonstrate any definite acute features or associated paraspinal hemorrhage. 2.  Possible acute fracture involving the inferior endplate of L4 on the right. 3.  Stable T12 and L2 compression deformity status post T12 vertebroplasty. 4.  Stable multilevel spondylosis and resulting spinal stenosis.  Original Report Authenticated By: Gerrianne Scale, M.D.   US Renal  05/01/2011  *RADIOLOGY REPORT*  Clinical Data: Chronic cystitis, hypertension, recurrent UTIs  RENAL/URINARY TRACT ULTRASOUND COMPLETE  Comparison:  Abdominal ultrasound 05/29/2005 Correlation:  CT abdomen and pelvis 06/11/2006  Findings:  Right Kidney:  9.0 cm  length.  Focal cortical scarring at upper pole of right kidney, unchanged since prior CT.  Normal cortical thickness and echogenicity otherwise seen.  No hydronephrosis or shadowing calcification.  Septated cyst identified at inferior pole, 1.5 x 1.5 x 1.5 cm, measured 1.8 x 1.3 cm on prior CT and 1.7 x 1.6 x 1.6 cm on prior ultrasound.  No additional masses.  Left Kidney:  10.9 cm length.  Normal cortical thickness.  Upper normal cortical echogenicity.  No mass, hydronephrosis or shadowing calcification.  The left kidney is less well visualized due to body habitus.  Bladder:  Only partially distended by urine, grossly normal appearance. Ureteral jets were not visualized.  IMPRESSION: Cortical scar upper pole of right kidney. Septated cyst inferior pole right kidney, not significantly changed in size versus previous exams.  Original Report Authenticated By: Lollie Marrow, M.D.   Dg Chest Portable 1 View  05/20/2011  *RADIOLOGY REPORT*  Clinical Data: Preop  PORTABLE CHEST - 1 VIEW  Comparison: 12/19/2010  Findings: Lungs are essentially clear. No pleural effusion or pneumothorax.  Stable cardiomegaly.  Left subclavian pacemaker.  IMPRESSION: No evidence of acute cardiopulmonary disease.  Stable cardiomegaly.  Original Report Authenticated By: Charline Bills, M.D.    Lab Results: Results for orders placed during the hospital encounter of 05/19/11 (from the past 48 hour(s))  PROTIME-INR     Status: Abnormal   Collection Time   05/22/11  5:28 AM      Component Value Range Comment   Prothrombin Time 30.6 (*) 11.6 - 15.2 (seconds)    INR 2.88 (*) 0.00 - 1.49    CBC     Status: Abnormal   Collection Time   05/22/11  5:28 AM      Component Value Range Comment   WBC 11.6 (*) 4.0 - 10.5 (K/uL)    RBC 4.24  3.87 - 5.11 (MIL/uL)    Hemoglobin 12.5  12.0 - 15.0 (g/dL)    HCT 45.4  09.8 - 11.9 (%)    MCV 91.3  78.0 - 100.0 (fL)    MCH 29.5  26.0 - 34.0 (pg)    MCHC 32.3  30.0 - 36.0 (g/dL)    RDW 14.7   82.9 - 56.2 (%)    Platelets 240  150 - 400 (K/uL)   BASIC METABOLIC PANEL     Status: Abnormal   Collection Time   05/22/11  5:28 AM      Component Value Range Comment   Sodium 137  135 - 145 (mEq/L)    Potassium 4.5  3.5 - 5.1 (mEq/L)    Chloride 106  96 - 112 (mEq/L)    CO2 25  19 - 32 (mEq/L)    Glucose, Bld 128 (*) 70 - 99 (mg/dL)    BUN 23  6 - 23 (mg/dL)    Creatinine, Ser 1.30  0.50 - 1.10 (mg/dL)    Calcium 8.9  8.4 - 10.5 (mg/dL)    GFR calc non Af Amer >60  >  60 (mL/min)    GFR calc Af Amer >60  >60 (mL/min)   PROTIME-INR     Status: Abnormal   Collection Time   05/23/11  5:07 AM      Component Value Range Comment   Prothrombin Time 30.4 (*) 11.6 - 15.2 (seconds)    INR 2.85 (*) 0.00 - 1.49     Recent Results (from the past 240 hour(s))  URINE CULTURE     Status: Normal   Collection Time   05/14/11  3:08 PM      Component Value Range Status Comment   Specimen Description URINE, CLEAN CATCH   Final    Special Requests NONE   Final    Setup Time [id number]   Final    Colony Count NO GROWTH   Final    Culture NO GROWTH   Final    Report Status 05/15/2011 FINAL   Final      Hospital Course: This very pleasant 77 year old lady was admitted with left ankle pain after she had sustained a fall. Please see initial history and physical examination. She was found to have a closed left ankle fracture. She was seen by Dr. Fuller Canada, orthopedics, who advised a use of a boot for 6 weeks. Analgesia was given and her pain has been controlled. Dr. Romeo Apple once the patient to followup with him in the next 6 weeks. The patient has been seen by physical therapy and the recommendation is for her to go to a skilled nursing facility for ongoing physical therapy. I think this is appropriate. She feels well today and denies any chest pain, dyspnea, abdominal pain or any  neurological symptoms.  Discharge Exam: Blood pressure 126/78, pulse 65, temperature 98.1 F (36.7 C), temperature  source Oral, resp. rate 20, height 5\' 5"  (1.651 m), weight 73.3 kg (161 lb 9.6 oz), SpO2 99.00%. She looks systemically well. Heart sounds are present and appear to be in sinus rhythm. Lung fields are clear without any wheezing, crackles or bronchial breathing. Her abdomen is soft and nontender. She is alert and orientated without any focal neurological signs.  Disposition: Skilled nursing facility, [facility].  Discharge Orders    Future Appointments: Provider: Department: Dept Phone: Center:   06/04/2011 11:00 AM [facility] [phone] [facility]   08/09/2011 11:45 AM Fuller Canada, MD [facility] [phone] [facility]     Future Orders Please Complete By Expires   Diet - low sodium heart healthy      Increase activity slowly      Discharge instructions      Comments:   Follow with Dr. Romeo Apple in 2 weeks.      Follow-up Information    Follow up with Fuller Canada, MD. Make an appointment in 6 weeks.   Contact information:   [address], [address] [place] [address] [phone]          Signed: Wilson Singer 05/23/2011, 10:13 AM

## 2011-05-30 ENCOUNTER — Ambulatory Visit (HOSPITAL_COMMUNITY)
Admit: 2011-05-30 | Discharge: 2011-05-30 | Disposition: A | Discharge status: Skilled Nursing Facility | Payer: Medicare Other | Source: Skilled Nursing Facility | Attending: Internal Medicine | Admitting: Internal Medicine

## 2011-05-30 DIAGNOSIS — M7989 Other specified soft tissue disorders: Secondary | ICD-10-CM | POA: Insufficient documentation

## 2011-05-31 NOTE — Progress Notes (Signed)
Encounter addended by: Clarene Critchley on: 05/31/2011  2:30 PM<BR>     Documentation filed: Flowsheet VN

## 2011-06-01 ENCOUNTER — Ambulatory Visit (HOSPITAL_COMMUNITY): Payer: Medicare Other | Attending: Internal Medicine

## 2011-06-01 DIAGNOSIS — R059 Cough, unspecified: Secondary | ICD-10-CM | POA: Insufficient documentation

## 2011-06-01 DIAGNOSIS — R05 Cough: Secondary | ICD-10-CM | POA: Insufficient documentation

## 2011-06-01 DIAGNOSIS — R509 Fever, unspecified: Secondary | ICD-10-CM | POA: Insufficient documentation

## 2011-06-03 ENCOUNTER — Encounter (HOSPITAL_COMMUNITY): Payer: Self-pay | Admitting: Emergency Medicine

## 2011-06-03 ENCOUNTER — Inpatient Hospital Stay (HOSPITAL_COMMUNITY)
Admission: EM | Admit: 2011-06-03 | Discharge: 2011-06-08 | DRG: 291 | Disposition: A | Discharge status: Skilled Nursing Facility | Payer: Medicare Other | Attending: Internal Medicine | Admitting: Internal Medicine

## 2011-06-03 ENCOUNTER — Other Ambulatory Visit: Payer: Self-pay

## 2011-06-03 ENCOUNTER — Emergency Department (HOSPITAL_COMMUNITY): Payer: Medicare Other

## 2011-06-03 DIAGNOSIS — I4891 Unspecified atrial fibrillation: Secondary | ICD-10-CM | POA: Diagnosis present

## 2011-06-03 DIAGNOSIS — G894 Chronic pain syndrome: Secondary | ICD-10-CM | POA: Diagnosis present

## 2011-06-03 DIAGNOSIS — I509 Heart failure, unspecified: Secondary | ICD-10-CM | POA: Diagnosis present

## 2011-06-03 DIAGNOSIS — I2699 Other pulmonary embolism without acute cor pulmonale: Secondary | ICD-10-CM | POA: Diagnosis present

## 2011-06-03 DIAGNOSIS — I10 Essential (primary) hypertension: Secondary | ICD-10-CM | POA: Diagnosis present

## 2011-06-03 DIAGNOSIS — I495 Sick sinus syndrome: Secondary | ICD-10-CM | POA: Diagnosis present

## 2011-06-03 DIAGNOSIS — M810 Age-related osteoporosis without current pathological fracture: Secondary | ICD-10-CM | POA: Diagnosis present

## 2011-06-03 DIAGNOSIS — M5430 Sciatica, unspecified side: Secondary | ICD-10-CM | POA: Diagnosis present

## 2011-06-03 DIAGNOSIS — S82892A Other fracture of left lower leg, initial encounter for closed fracture: Secondary | ICD-10-CM | POA: Diagnosis present

## 2011-06-03 DIAGNOSIS — M47817 Spondylosis without myelopathy or radiculopathy, lumbosacral region: Secondary | ICD-10-CM | POA: Diagnosis present

## 2011-06-03 DIAGNOSIS — I480 Paroxysmal atrial fibrillation: Secondary | ICD-10-CM | POA: Diagnosis present

## 2011-06-03 DIAGNOSIS — I5031 Acute diastolic (congestive) heart failure: Principal | ICD-10-CM | POA: Diagnosis present

## 2011-06-03 DIAGNOSIS — Z950 Presence of cardiac pacemaker: Secondary | ICD-10-CM

## 2011-06-03 DIAGNOSIS — E039 Hypothyroidism, unspecified: Secondary | ICD-10-CM | POA: Diagnosis present

## 2011-06-03 DIAGNOSIS — Z66 Do not resuscitate: Secondary | ICD-10-CM | POA: Diagnosis present

## 2011-06-03 LAB — BASIC METABOLIC PANEL
CO2: 24 mEq/L (ref 19–32)
Chloride: 99 mEq/L (ref 96–112)
GFR calc Af Amer: >60 mL/min (ref >60)
Potassium: 3.9 mEq/L (ref 3.5–5.1)
Sodium: 135 mEq/L (ref 135–145)

## 2011-06-03 LAB — CARDIAC PANEL(CRET KIN+CKTOT+MB+TROPI)
CK, MB: 1.3 ng/mL (ref 0.3–4.0)
Relative Index: INVALID (ref 0.0–2.5)
Total CK: 36 U/L (ref 7–177)
Troponin I: <0.3 ng/mL (ref <0.30)
Troponin I: <0.3 ng/mL (ref <0.30)

## 2011-06-03 LAB — URINALYSIS, ROUTINE W REFLEX MICROSCOPIC
Bilirubin Urine: NEGATIVE
Ketones, ur: NEGATIVE mg/dL
Nitrite: NEGATIVE
Protein, ur: NEGATIVE mg/dL
Urobilinogen, UA: 0.2 mg/dL (ref 0.0–1.0)
pH: 7 (ref 5.0–8.0)

## 2011-06-03 LAB — DIFFERENTIAL
Basophils Absolute: 0 10*3/uL (ref 0.0–0.1)
Basophils Relative: 0 % (ref 0–1)
Lymphocytes Relative: 5 % — ABNORMAL LOW (ref 12–46)
Neutro Abs: 8.2 10*3/uL — ABNORMAL HIGH (ref 1.7–7.7)
Neutrophils Relative %: 83 % — ABNORMAL HIGH (ref 43–77)

## 2011-06-03 LAB — CBC
Hemoglobin: 12.5 g/dL (ref 12.0–15.0)
MCHC: 32.6 g/dL (ref 30.0–36.0)
RDW: 16.6 % — ABNORMAL HIGH (ref 11.5–15.5)
WBC: 9.9 10*3/uL (ref 4.0–10.5)

## 2011-06-03 LAB — PROTIME-INR: INR: 1.46 (ref 0.00–1.49)

## 2011-06-03 MED ORDER — FLUCONAZOLE 100 MG PO TABS
100.0000 mg | ORAL_TABLET | Freq: Every day | ORAL | Status: DC
  Administered 2011-06-04 – 2011-06-08 (×5): 100 mg via ORAL
  Filled 2011-06-03 (×5): qty 1

## 2011-06-03 MED ORDER — ALBUTEROL SULFATE (5 MG/ML) 0.5% IN NEBU
INHALATION_SOLUTION | RESPIRATORY_TRACT | Status: AC
  Filled 2011-06-03: qty 1

## 2011-06-03 MED ORDER — DOCUSATE SODIUM 100 MG PO CAPS
100.0000 mg | ORAL_CAPSULE | Freq: Two times a day (BID) | ORAL | Status: DC
  Administered 2011-06-03 – 2011-06-07 (×9): 100 mg via ORAL
  Filled 2011-06-03 (×13): qty 1

## 2011-06-03 MED ORDER — LEVOTHYROXINE SODIUM 100 MCG PO TABS
100.0000 ug | ORAL_TABLET | Freq: Every day | ORAL | Status: DC
  Administered 2011-06-04 – 2011-06-08 (×5): 100 ug via ORAL
  Filled 2011-06-03 (×7): qty 1

## 2011-06-03 MED ORDER — WARFARIN SODIUM 2 MG PO TABS
2.0000 mg | ORAL_TABLET | Freq: Every day | ORAL | Status: DC
  Filled 2011-06-03: qty 1

## 2011-06-03 MED ORDER — NYSTATIN 100000 UNIT/ML MT SUSP
500000.0000 [IU] | Freq: Four times a day (QID) | OROMUCOSAL | Status: DC
  Administered 2011-06-03 – 2011-06-08 (×19): 500000 [IU] via ORAL
  Filled 2011-06-03 (×19): qty 5

## 2011-06-03 MED ORDER — ALBUTEROL SULFATE (5 MG/ML) 0.5% IN NEBU
5.0000 mg | INHALATION_SOLUTION | Freq: Four times a day (QID) | RESPIRATORY_TRACT | Status: DC
  Administered 2011-06-03: 5 mg via RESPIRATORY_TRACT
  Filled 2011-06-03 (×2): qty 0.5

## 2011-06-03 MED ORDER — ONDANSETRON HCL 4 MG/2ML IJ SOLN
4.0000 mg | Freq: Four times a day (QID) | INTRAMUSCULAR | Status: DC | PRN
  Filled 2011-06-03: qty 2

## 2011-06-03 MED ORDER — IPRATROPIUM BROMIDE 0.02 % IN SOLN
RESPIRATORY_TRACT | Status: AC
  Filled 2011-06-03: qty 2.5

## 2011-06-03 MED ORDER — ALBUTEROL SULFATE (5 MG/ML) 0.5% IN NEBU: 2.5000 mg | INHALATION_SOLUTION | Freq: Once | RESPIRATORY_TRACT | Status: DC

## 2011-06-03 MED ORDER — BIOTENE DRY MOUTH MT LIQD
Freq: Every day | OROMUCOSAL | Status: DC
  Administered 2011-06-04 – 2011-06-08 (×5): via OROMUCOSAL

## 2011-06-03 MED ORDER — ACETAMINOPHEN 325 MG PO TABS
650.0000 mg | ORAL_TABLET | Freq: Four times a day (QID) | ORAL | Status: DC | PRN
  Administered 2011-06-05: 650 mg via ORAL
  Filled 2011-06-03: qty 2

## 2011-06-03 MED ORDER — SIMVASTATIN 10 MG PO TABS
10.0000 mg | ORAL_TABLET | Freq: Every day | ORAL | Status: DC
  Administered 2011-06-03 – 2011-06-07 (×5): 10 mg via ORAL
  Filled 2011-06-03 (×6): qty 1

## 2011-06-03 MED ORDER — NITROGLYCERIN IN D5W 200-5 MCG/ML-% IV SOLN: 5.0000 ug/min | INTRAVENOUS | Status: DC

## 2011-06-03 MED ORDER — CALCIUM CARBONATE-VITAMIN D 500-200 MG-UNIT PO TABS
1.0000 | ORAL_TABLET | Freq: Two times a day (BID) | ORAL | Status: DC
  Administered 2011-06-04 – 2011-06-08 (×9): 1 via ORAL
  Filled 2011-06-03 (×12): qty 1

## 2011-06-03 MED ORDER — BIMATOPROST 0.03 % OP SOLN
1.0000 [drp] | Freq: Every day | OPHTHALMIC | Status: DC
  Administered 2011-06-03 – 2011-06-07 (×4): 1 [drp] via OPHTHALMIC
  Filled 2011-06-03: qty 2.5

## 2011-06-03 MED ORDER — POLYETHYLENE GLYCOL 3350 17 G PO PACK
17.0000 g | PACK | Freq: Every day | ORAL | Status: DC
  Administered 2011-06-04 – 2011-06-07 (×4): 17 g via ORAL
  Filled 2011-06-03 (×5): qty 1

## 2011-06-03 MED ORDER — GABAPENTIN 300 MG PO CAPS
300.0000 mg | ORAL_CAPSULE | Freq: Three times a day (TID) | ORAL | Status: DC
  Administered 2011-06-03 – 2011-06-08 (×15): 300 mg via ORAL
  Filled 2011-06-03 (×18): qty 1

## 2011-06-03 MED ORDER — GUAIFENESIN-DM 100-10 MG/5ML PO SYRP: 5.0000 mL | ORAL_SOLUTION | ORAL | Status: DC | PRN

## 2011-06-03 MED ORDER — METOPROLOL SUCCINATE ER 50 MG PO TB24
50.0000 mg | ORAL_TABLET | Freq: Every day | ORAL | Status: DC
  Administered 2011-06-03 – 2011-06-07 (×5): 50 mg via ORAL
  Filled 2011-06-03 (×7): qty 1

## 2011-06-03 MED ORDER — SODIUM CHLORIDE 0.9 % IV SOLN
INTRAVENOUS | Status: DC
  Administered 2011-06-03: 10:00:00 via INTRAVENOUS

## 2011-06-03 MED ORDER — FUROSEMIDE 10 MG/ML IJ SOLN
40.0000 mg | Freq: Once | INTRAMUSCULAR | Status: AC
  Administered 2011-06-03: 40 mg via INTRAVENOUS
  Filled 2011-06-03: qty 4

## 2011-06-03 MED ORDER — LIDOCAINE 5 % EX PTCH
1.0000 | MEDICATED_PATCH | CUTANEOUS | Status: DC
  Administered 2011-06-03 – 2011-06-07 (×5): 1 via TRANSDERMAL
  Filled 2011-06-03 (×8): qty 1

## 2011-06-03 MED ORDER — ALBUTEROL SULFATE (5 MG/ML) 0.5% IN NEBU
5.0000 mg | INHALATION_SOLUTION | Freq: Once | RESPIRATORY_TRACT | Status: AC
  Administered 2011-06-03: 5 mg via RESPIRATORY_TRACT

## 2011-06-03 MED ORDER — OXYCODONE-ACETAMINOPHEN 5-325 MG PO TABS
1.0000 | ORAL_TABLET | ORAL | Status: DC | PRN
  Administered 2011-06-03 – 2011-06-08 (×10): 1 via ORAL
  Filled 2011-06-03 (×11): qty 1

## 2011-06-03 MED ORDER — IPRATROPIUM BROMIDE 0.02 % IN SOLN
0.5000 mg | Freq: Four times a day (QID) | RESPIRATORY_TRACT | Status: DC
  Administered 2011-06-03: 0.5 mg via RESPIRATORY_TRACT
  Filled 2011-06-03: qty 2.5

## 2011-06-03 MED ORDER — ALBUTEROL SULFATE (5 MG/ML) 0.5% IN NEBU
2.5000 mg | INHALATION_SOLUTION | RESPIRATORY_TRACT | Status: DC
  Administered 2011-06-03 – 2011-06-08 (×28): 2.5 mg via RESPIRATORY_TRACT
  Filled 2011-06-03 (×27): qty 0.5

## 2011-06-03 MED ORDER — NITROGLYCERIN IN D5W 200-5 MCG/ML-% IV SOLN
5.0000 ug/min | INTRAVENOUS | Status: DC
  Administered 2011-06-03: 5 ug/min via INTRAVENOUS
  Filled 2011-06-03: qty 250

## 2011-06-03 MED ORDER — SODIUM CHLORIDE 0.9 % IV SOLN: 250.0000 mL | INTRAVENOUS | Status: DC

## 2011-06-03 MED ORDER — SACCHAROMYCES BOULARDII 250 MG PO CAPS
250.0000 mg | ORAL_CAPSULE | Freq: Every day | ORAL | Status: DC
  Administered 2011-06-04 – 2011-06-08 (×5): 250 mg via ORAL
  Filled 2011-06-03 (×7): qty 1

## 2011-06-03 MED ORDER — FUROSEMIDE 10 MG/ML IJ SOLN
40.0000 mg | Freq: Two times a day (BID) | INTRAMUSCULAR | Status: DC
Start: 2011-06-03 — End: 2011-06-05
  Administered 2011-06-03 – 2011-06-04 (×3): 40 mg via INTRAVENOUS
  Filled 2011-06-03 (×3): qty 4

## 2011-06-03 MED ORDER — DORZOLAMIDE HCL-TIMOLOL MAL 2-0.5 % OP SOLN
1.0000 [drp] | Freq: Two times a day (BID) | OPHTHALMIC | Status: DC
  Administered 2011-06-03 – 2011-06-08 (×9): 1 [drp] via OPHTHALMIC
  Filled 2011-06-03: qty 10

## 2011-06-03 MED ORDER — ACETAMINOPHEN 650 MG RE SUPP: 650.0000 mg | Freq: Four times a day (QID) | RECTAL | Status: DC | PRN

## 2011-06-03 MED ORDER — BRIMONIDINE TARTRATE 0.15 % OP SOLN
1.0000 [drp] | Freq: Two times a day (BID) | OPHTHALMIC | Status: DC
  Administered 2011-06-03 – 2011-06-08 (×9): 1 [drp] via OPHTHALMIC
  Filled 2011-06-03: qty 5

## 2011-06-03 MED ORDER — SODIUM CHLORIDE 0.9 % IJ SOLN
10.0000 mL | Freq: Once | INTRAMUSCULAR | Status: DC
  Filled 2011-06-03: qty 10

## 2011-06-03 MED ORDER — IPRATROPIUM BROMIDE 0.02 % IN SOLN
0.5000 mg | RESPIRATORY_TRACT | Status: DC
  Administered 2011-06-03 – 2011-06-08 (×27): 0.5 mg via RESPIRATORY_TRACT
  Filled 2011-06-03 (×26): qty 2.5

## 2011-06-03 MED ORDER — SENNA 8.6 MG PO TABS
2.0000 | ORAL_TABLET | Freq: Every day | ORAL | Status: DC | PRN
  Filled 2011-06-03: qty 2

## 2011-06-03 MED ORDER — AMIODARONE HCL 200 MG PO TABS
200.0000 mg | ORAL_TABLET | Freq: Every day | ORAL | Status: DC
  Administered 2011-06-04 – 2011-06-08 (×5): 200 mg via ORAL
  Filled 2011-06-03 (×7): qty 1

## 2011-06-03 MED ORDER — ONDANSETRON HCL 4 MG PO TABS: 4.0000 mg | ORAL_TABLET | Freq: Four times a day (QID) | ORAL | Status: DC | PRN

## 2011-06-03 MED ORDER — IPRATROPIUM BROMIDE 0.02 % IN SOLN
0.5000 mg | Freq: Once | RESPIRATORY_TRACT | Status: AC
  Administered 2011-06-03: 0.5 mg via RESPIRATORY_TRACT

## 2011-06-03 MED ORDER — SODIUM CHLORIDE 0.9 % IJ SOLN
3.0000 mL | Freq: Two times a day (BID) | INTRAMUSCULAR | Status: DC
  Administered 2011-06-03 – 2011-06-08 (×8): 3 mL via INTRAVENOUS
  Filled 2011-06-03 (×10): qty 3

## 2011-06-03 MED ORDER — WARFARIN SODIUM 2 MG PO TABS
4.0000 mg | ORAL_TABLET | Freq: Once | ORAL | Status: AC
  Administered 2011-06-03: 4 mg via ORAL
  Filled 2011-06-03: qty 2

## 2011-06-03 MED ORDER — FUROSEMIDE 10 MG/ML IJ SOLN: 40.0000 mg | Freq: Every day | INTRAMUSCULAR | Status: DC

## 2011-06-03 MED ORDER — PREDNISONE 10 MG PO TABS
10.0000 mg | ORAL_TABLET | Freq: Every day | ORAL | Status: DC
  Administered 2011-06-03 – 2011-06-06 (×4): 10 mg via ORAL
  Filled 2011-06-03 (×4): qty 1

## 2011-06-03 MED ORDER — SODIUM CHLORIDE 0.9 % IJ SOLN: 3.0000 mL | INTRAMUSCULAR | Status: DC | PRN

## 2011-06-03 NOTE — ED Notes (Signed)
D/c venti-mask and placed pt on  O2 2L/min, pulse ox 93%.  Pt tolerating well.  No resp distress noted.  Eating meal tray at this time.

## 2011-06-03 NOTE — ED Provider Notes (Signed)
History   Scribed for Nicholes Stairs, MD, the patient was seen in room APA04/APA04. This chart was scribed by Clarita Crane. This patient's care was started at 9:38AM.   CSN: [id number] Arrival date & time: 06/03/2011  9:46 AM  Chief Complaint  Patient presents with  . Shortness of Breath   HPI MALISSIA RABBANI is a 77 y.o. female who presents to the Emergency Department complaining of SOB onset 3 days ago and worsening since and significantly worse this morning with associated productive cough with brown sputum, nausea and chills. Denies chest pain, hemoptysis, vomiting, diarrhea, fever. Patient states she has been non-ambulatory for 1 week while performing rehab at [facility] facility due to a fracture in her left foot. Per [facility] nursing staff patient's O2 saturation percentages have been decreased. Also report patient is currently being treated for a UTI with Augmentin which was started yesterday. Patient with h/o CHF, hypertension, pacemaker, CAD, hypothyroidism.   Cardiologist- [facility] PCP- Dr. Regino Schultze  PAST MEDICAL HISTORY:  Past Medical History  Diagnosis Date  . Hypertension   . High cholesterol   . Arthritis   . Hypothyroidism   . Coronary artery disease   . Arrhythmia     pacemaker  . Glaucoma     PAST SURGICAL HISTORY:  Past Surgical History  Procedure Date  . Total hip arthroplasty     right side  . Pacemaker insertion   . Appendectomy   . Abdominal hysterectomy     FAMILY HISTORY:  History reviewed. No pertinent family history.   SOCIAL HISTORY: History   Social History  . Marital Status: Married    Spouse Name: N/A    Number of Children: N/A  . Years of Education: N/A   Social History Main Topics  . Smoking status: Never Smoker   . Smokeless tobacco: None  . Alcohol Use: No  . Drug Use: No  . Sexually Active:    Other Topics Concern  . None   Social History Narrative  . None     Review of Systems 10 Systems reviewed  and are negative for acute change except as noted in the HPI.  Allergies  Review of patient's allergies indicates no known allergies.  Home Medications   Current Outpatient Rx  Name Route Sig Dispense Refill  . RISAQUAD PO CAPS Oral Take 1 capsule by mouth daily.      . ALPHAGAN P 0.1 % OP SOLN Both Eyes Place 1 drop into both eyes 2 (two) times daily.     . AMIODARONE HCL 200 MG PO TABS Oral Take 200 mg by mouth daily.     Marland Kitchen AMLODIPINE BESYLATE 5 MG PO TABS Oral Take 5 mg by mouth daily.     . AMOXICILLIN-POT CLAVULANATE 500-125 MG PO TABS Oral Take 1 tablet by mouth 2 (two) times daily.      Marland Kitchen CALCIUM CARBONATE-VITAMIN D 500-200 MG-UNIT PO TABS Oral Take 1 tablet by mouth 2 (two) times daily.      . DORZOLAMIDE HCL-TIMOLOL MAL 22.3-6.8 MG/ML OP SOLN Both Eyes Place 1 drop into both eyes 2 (two) times daily.     Marland Kitchen FLUCONAZOLE 100 MG PO TABS Oral Take 100 mg by mouth daily. Started 05/29/11 until 06/03/11     . GABAPENTIN 300 MG PO CAPS Oral Take 1 capsule (300 mg total) by mouth 3 (three) times daily.    . GUAIFENESIN 600 MG PO TB12 Oral Take 600 mg by mouth 2 (two) times  daily.      Marland Kitchen HYDROCODONE-ACETAMINOPHEN 5-325 MG PO TABS Oral Take 1.5 tablets by mouth every 4 (four) hours as needed. Pain     . IPRATROPIUM-ALBUTEROL 0.5-2.5 (3) MG/3ML IN SOLN Nebulization Take 3 mLs by nebulization every 4 (four) hours as needed. Shortness of Breath/Wheezing     . LIDOCAINE 5 % EX PTCH Transdermal Place 1 patch onto the skin daily. Remove & Discard patch within 12 hours or as directed by MD    . LUMIGAN 0.03 % OP SOLN Both Eyes Place 1 drop into both eyes at bedtime.     Marland Kitchen METOPROLOL SUCCINATE 50 MG PO TB24 Oral Take 50 mg by mouth daily.     . NYSTATIN 100000 UNIT/ML MT SUSP Oral Take 500,000 Units by mouth 4 (four) times daily. Started on 05/26/11 ended on 06/02/11     . POLYETHYLENE GLYCOL 3350 PO PACK Oral Take 17 g by mouth daily.      Marland Kitchen PRAVASTATIN SODIUM 20 MG PO TABS Oral Take 20 mg by mouth  daily.     Marland Kitchen PREDNISONE 10 MG PO TABS Oral Take 10 mg by mouth daily. Patient started taper on 05/24/11 and ended on 05/29/11. Take 3 tablets daily for 2 days, then 2 tablets daily for 2 days, then 1 tablet daily for 1 day, then take 0.5 tablet daily for last day.     Marland Kitchen SYNTHROID 100 MCG PO TABS Oral Take 100 mcg by mouth daily.     . WARFARIN SODIUM 2 MG PO TABS Oral Take 2 mg by mouth daily.      Marland Kitchen ESTRACE 0.1 MG/GM VA CREA Vaginal Place 2 g vaginally daily as needed.     Marland Kitchen HYDROCODONE-ACETAMINOPHEN 5-325 MG PO TABS Oral Take 1.5 tablets by mouth every 6 (six) hours as needed. 30 tablet 0  . SENNOSIDES-DOCUSATE SODIUM 8.6-50 MG PO TABS Oral Take 1 tablet by mouth daily as needed for constipation. 30 tablet 0    BP 115/89  Pulse 65  Temp 98.9 F (37.2 C)  Resp 21  Ht 5\' 5"  (1.651 m)  Wt 180 lb (81.647 kg)  BMI 29.95 kg/m2  SpO2 94%  Physical Exam  Nursing note and vitals reviewed. Constitutional: She is oriented to person, place, and time. She appears well-developed and well-nourished.  HENT:  Head: Normocephalic and atraumatic.  Eyes: Conjunctivae and EOM are normal. Pupils are equal, round, and reactive to light.  Neck: Neck supple. No JVD present. Carotid bruit is not present.  Cardiovascular: Normal rate, regular rhythm and intact distal pulses.  Exam reveals no gallop and no friction rub.   No murmur heard. Pulmonary/Chest: Tachypnea noted. She is in respiratory distress.       Diffuse rhonchi bilaterally.   Abdominal: Soft. Bowel sounds are normal. She exhibits no distension. There is tenderness (diffuse, mild).  Musculoskeletal: Normal range of motion. She exhibits no edema.       Patient left foot in walking boot.   Neurological: She is alert and oriented to person, place, and time. No sensory deficit.  Skin: Skin is warm and dry.  Psychiatric: She has a normal mood and affect. Her behavior is normal.    ED Course  Procedures  MDM   OTHER DATA REVIEWED: Nursing  notes, vital signs, and past medical records reviewed. Lab results reviewed and considered Imaging results reviewed and considered  DIAGNOSTIC STUDIES: Oxygen Saturation is 88% on non-rebreather-6L, hypoxic by my interpretation.     Date: 06/03/2011  Rate: 65  Rhythm: electronically paced- normal rhythm  QRS Axis: left  Intervals: normal  ST/T Wave abnormalities: normal  Conduction Disutrbances:none  Narrative Interpretation:   Old EKG Reviewed: Previously atrial paced, currently ventricular paced   LABS / RADIOLOGY: Results for orders placed during the hospital encounter of 06/03/11  CBC      Component Value Range   WBC 9.9  4.0 - 10.5 (K/uL)   RBC 4.17  3.87 - 5.11 (MIL/uL)   Hemoglobin 12.5  12.0 - 15.0 (g/dL)   HCT 40.9  81.1 - 91.4 (%)   MCV 92.1  78.0 - 100.0 (fL)   MCH 30.0  26.0 - 34.0 (pg)   MCHC 32.6  30.0 - 36.0 (g/dL)   RDW 78.2 (*) 95.6 - 15.5 (%)   Platelets 180  150 - 400 (K/uL)  DIFFERENTIAL      Component Value Range   Neutrophils Relative 83 (*) 43 - 77 (%)   Neutro Abs 8.2 (*) 1.7 - 7.7 (K/uL)   Lymphocytes Relative 5 (*) 12 - 46 (%)   Lymphs Abs 0.5 (*) 0.7 - 4.0 (K/uL)   Monocytes Relative 12  3 - 12 (%)   Monocytes Absolute 1.2 (*) 0.1 - 1.0 (K/uL)   Eosinophils Relative 0  0 - 5 (%)   Eosinophils Absolute 0.0  0.0 - 0.7 (K/uL)   Basophils Relative 0  0 - 1 (%)   Basophils Absolute 0.0  0.0 - 0.1 (K/uL)  BASIC METABOLIC PANEL      Component Value Range   Sodium 135  135 - 145 (mEq/L)   Potassium 3.9  3.5 - 5.1 (mEq/L)   Chloride 99  96 - 112 (mEq/L)   CO2 24  19 - 32 (mEq/L)   Glucose, Bld 126 (*) 70 - 99 (mg/dL)   BUN 20  6 - 23 (mg/dL)   Creatinine, Ser 2.13  0.50 - 1.10 (mg/dL)   Calcium 9.6  8.4 - 08.6 (mg/dL)   GFR calc non Af Amer 57 (*) >60 (mL/min)   GFR calc Af Amer >60  >60 (mL/min)  CARDIAC PANEL(CRET KIN+CKTOT+MB+TROPI)      Component Value Range   Total CK 36  7 - 177 (U/L)   CK, MB 1.3  0.3 - 4.0 (ng/mL)   Troponin I <0.30   <0.30 (ng/mL)   Relative Index RELATIVE INDEX IS INVALID  0.0 - 2.5   URINALYSIS, ROUTINE W REFLEX MICROSCOPIC      Component Value Range   Color, Urine YELLOW  YELLOW    Appearance HAZY (*) CLEAR    Specific Gravity, Urine 1.020  1.005 - 1.030    pH 7.0  5.0 - 8.0    Glucose, UA NEGATIVE  NEGATIVE (mg/dL)   Hgb urine dipstick NEGATIVE  NEGATIVE    Bilirubin Urine NEGATIVE  NEGATIVE    Ketones, ur NEGATIVE  NEGATIVE (mg/dL)   Protein, ur NEGATIVE  NEGATIVE (mg/dL)   Urobilinogen, UA 0.2  0.0 - 1.0 (mg/dL)   Nitrite NEGATIVE  NEGATIVE    Leukocytes, UA NEGATIVE  NEGATIVE    Dg Chest 1 View  06/01/2011  *RADIOLOGY REPORT*  Clinical Data: Cough.  Wheezing.  Low grade fever.  Hypertension.  CHEST - 1 VIEW  Comparison: 05/20/2011  Findings: Dual lead pacer, with leads right atrium right ventricle. Artifact from the patient's bra.  Midline trachea.  Mild cardiomegaly.  No right and no definite left pleural effusion.  There is slight increase in interstitial thickening.  Mild right base volume loss.  Increased density projecting over the left lung base could be due to overlying soft tissues.  IMPRESSION:  1.  Cardiomegaly with slight increase in pulmonary interstitial prominence.  Suspect mild pulmonary venous congestion, without overt congestive failure. 2.  Increased density projecting over the left lung base.  Could be due to overlying soft tissues. Early airspace disease cannot be excluded.  PA and lateral radiographs may be informative.  Original Report Authenticated By: Consuello Bossier, M.D.   Dg Chest Portable 1 View  06/03/2011  *RADIOLOGY REPORT*  Clinical Data: Shortness of breath.  Congestive heart failure. Coronary artery disease.  PORTABLE CHEST - 1 VIEW  Comparison: 06/01/2011  Findings: Cardiomegaly stable.  Increased interstitial infiltrates and central perihilar air space disease is seen, consistent with increased pulmonary edema.  Increased atelectasis seen in the left retrocardiac  lung base.  Dual lead transvenous pacemaker remains in appropriate position.  IMPRESSION:  1.  Increased pulmonary edema and left retrocardiac atelectasis. 2.  Stable cardiomegaly.  Original Report Authenticated By: Danae Orleans, M.D.    ED COURSE / COORDINATION OF CARE: Orders Placed This Encounter  Procedures  . DG Chest Portable 1 View  . DG Chest Portable 1 View  . CBC  . Differential  . Basic metabolic panel  . Cardiac panel(cret kin+cktot+mb+tropi)  . Urinalysis, Routine w reflex microscopic  . Cardiac monitoring  . Pulse oximetry, continuous  . ED EKG  . Saline lock IV  12:10PM- Patient informed of lab and imaging results and cause of current symptoms. Patient verifies she is followed by [facility]. Informed of intent to admit to hospital. Patient agrees with plan set forth at this time.  12:53PM- Consult complete with Hospitalist. Patient case explained and discussed. Hospitalist agrees to admit patient for further evaluation and treatment.   MDM: Differential Diagnosis: Congestive heart failure, versus COPD  CRITICAL CARE NOTE: Critical care time was provided for 30 minutes exclusive of separately billable procedures and treating other patients.  This was necessary to treat or prevent further deterioration of the following condition(s) CHF requiring tx with iv lasix and IV ntg which the patient had and/or had a high probability of suddenly developing. This involved direct bedside patient care, speaking with family members, review of past medical records, reviewing the results of the laboratory and diagnostic studies, consulting with other physicians, as well as evaluating the effectiveness of the therapy instituted as described.     PLAN: I will perform a chest x-ray, laboratory testing, and then determine the medical treatment.  Based upon the test result.  The patient is to return the emergency department if there is any worsening of symptoms. I have reviewed the  discharge instructions with the patient/family  CONDITION ON DISCHARGE: fair  DIAGNOSIS: Congestive heart failure with pulmonary edema and mild hypoxia   MEDICATIONS GIVEN IN THE E.D.  Medications  0.9 %  sodium chloride infusion (  Intravenous New Bag 06/03/11 1015)  predniSONE (DELTASONE) 10 MG tablet (not administered)  fluconazole (DIFLUCAN) 100 MG tablet (not administered)  ipratropium-albuterol (DUONEB) 0.5-2.5 (3) MG/3ML SOLN (not administered)  guaiFENesin (MUCINEX) 600 MG 12 hr tablet (not administered)  amoxicillin-clavulanate (AUGMENTIN) 500-125 MG per tablet (not administered)  acidophilus (RISAQUAD) CAPS (not administered)  nystatin (MYCOSTATIN) 100000 UNIT/ML suspension (not administered)  calcium-vitamin D (OSCAL WITH D) 500-200 MG-UNIT per tablet (not administered)  HYDROcodone-acetaminophen (NORCO) 5-325 MG per tablet (not administered)  polyethylene glycol (MIRALAX / GLYCOLAX) packet (not administered)  warfarin (COUMADIN)  2 MG tablet (not administered)  nitroGLYCERIN 0.2 mg/mL in dextrose 5 % infusion (5 mcg/min Intravenous New Bag 06/03/11 1120)  ipratropium (ATROVENT) 0.02 % nebulizer solution 0.5 mg (0.5 mg Nebulization Given 06/03/11 0956)  albuterol (PROVENTIL) (5 MG/ML) 0.5% nebulizer solution 5 mg (5 mg Nebulization Given 06/03/11 0959)  furosemide (LASIX) 10 MG/ML injection 40 mg (40 mg Intravenous Given 06/03/11 1120)   1:01 PM I spoke with the [facility] hospitalist.  She will come admit the pt.    I personally performed the services described in this documentation, which was scribed in my presence. The recorded information has been reviewed and considered. Nicholes Stairs, MD           Nicholes Stairs, MD 06/03/11 1302

## 2011-06-03 NOTE — ED Notes (Signed)
Per [facility] Staff - Pt has been treated for URI with Augmentin that was started yesterday.  Pt became sob this morning with decreased O2 sats and temp.

## 2011-06-03 NOTE — Progress Notes (Deleted)
[name]  MRN: [id number]  DOB/AGE: 11/19/1926 77 y.o.  Primary Care Physician:FUSCO,LAWRENCE J., MD  Admit date: 06/03/2011  Chief Complaint: Shortness of breath  HPI: This is an 77-year-old female with a history of paroxismal atrial fibrillation recent history of left ankle fracture, recently discharged and sent [facility] for rehabilitation, who presents to the ER with a chief complaint of shortness of breath. Her symptoms started about 3-4 days ago. She initially developed a nonproductive cough associated with some wheezing. She then started noticing bilateral lower extremities edema. She subsequently started experiencing dyspnea on exertion progressing to dyspnea at rest . She denied any fever chills or rigors. She denied any chest pain but has experienced significant orthopnea and paroxysmal nocturnal dyspnea. She denies any nausea vomiting abdominal pain per se . She denies any prior history of DVT or PE .  Past Medical History   Diagnosis  Date   .  Hypertension    .  High cholesterol    .  Arthritis    .  Hypothyroidism    .  Coronary artery disease    .  Arrhythmia      pacemaker   .  Glaucoma     Past Surgical History   Procedure  Date   .  Total hip arthroplasty      right side   .  Pacemaker insertion    .  Appendectomy    .  Abdominal hysterectomy     Prior to Admission medications   Medication  Sig  Start Date  End Date  Taking?  Authorizing Provider   acidophilus (RISAQUAD) CAPS  Take 1 capsule by mouth daily.  06/02/11  06/09/11  Yes  Historical Provider, MD   ALPHAGAN P 0.1 % SOLN  Place 1 drop into both eyes 2 (two) times daily.  04/09/11   Yes  Historical Provider, MD   amiodarone (PACERONE) 200 MG tablet  Take 200 mg by mouth daily.  04/30/11   Yes  Historical Provider, MD   amLODipine (NORVASC) 5 MG tablet  Take 5 mg by mouth daily.  04/01/11   Yes  Historical Provider, MD   amoxicillin-clavulanate (AUGMENTIN) 500-125 MG per tablet  Take 1 tablet by  mouth 2 (two) times daily.  06/02/11  06/09/11  Yes  Historical Provider, MD   calcium-vitamin D (OSCAL WITH D) 500-200 MG-UNIT per tablet  Take 1 tablet by mouth 2 (two) times daily.    Yes  Historical Provider, MD   dorzolamide-timolol (COSOPT) 22.3-6.8 MG/ML ophthalmic solution  Place 1 drop into both eyes 2 (two) times daily.  04/09/11   Yes  Historical Provider, MD   fluconazole (DIFLUCAN) 100 MG tablet  Take 100 mg by mouth daily. Started 05/29/11 until 06/03/11    Yes  Historical Provider, MD   gabapentin (NEURONTIN) 300 MG capsule  Take 1 capsule (300 mg total) by mouth 3 (three) times daily.  05/18/11  05/17/12  Yes  Belkys Regalado, MD   guaiFENesin (MUCINEX) 600 MG 12 hr tablet  Take 600 mg by mouth 2 (two) times daily.  06/01/11  06/05/11  Yes  Historical Provider, MD   HYDROcodone-acetaminophen (NORCO) 5-325 MG per tablet  Take 1.5 tablets by mouth every 4 (four) hours as needed. Pain    Yes  Historical Provider, MD   ipratropium-albuterol (DUONEB) 0.5-2.5 (3) MG/3ML SOLN  Take 3 mLs by nebulization every 4 (four) hours as needed. Shortness of Breath/Wheezing    Yes  Historical   Provider, MD   lidocaine (LIDODERM) 5 %  Place 1 patch onto the skin daily. Remove & Discard patch within 12 hours or as directed by MD    Yes  Historical Provider, MD   LUMIGAN 0.03 % ophthalmic solution  Place 1 drop into both eyes at bedtime.  04/24/11   Yes  Historical Provider, MD   metoprolol (TOPROL-XL) 50 MG 24 hr tablet  Take 50 mg by mouth daily.  04/09/11   Yes  Historical Provider, MD   nystatin (MYCOSTATIN) 100000 UNIT/ML suspension  Take 500,000 Units by mouth 4 (four) times daily. Started on 05/26/11 ended on 06/02/11    Yes  Historical Provider, MD   polyethylene glycol (MIRALAX / GLYCOLAX) packet  Take 17 g by mouth daily.    Yes  Historical Provider, MD   pravastatin (PRAVACHOL) 20 MG tablet  Take 20 mg by mouth daily.  04/14/11   Yes  Historical Provider, MD   predniSONE (DELTASONE) 10 MG tablet  Take 10 mg  by mouth daily. Patient started taper on 05/24/11 and ended on 05/29/11. Take 3 tablets daily for 2 days, then 2 tablets daily for 2 days, then 1 tablet daily for 1 day, then take 0.5 tablet daily for last day.    Yes  Historical Provider, MD   SYNTHROID 100 MCG tablet  Take 100 mcg by mouth daily.  04/11/11   Yes  Historical Provider, MD   warfarin (COUMADIN) 2 MG tablet  Take 2 mg by mouth daily.  05/23/11  05/22/12  Yes  Nimish C Gosrani   ESTRACE VAGINAL 0.1 MG/GM vaginal cream  Place 2 g vaginally daily as needed.  04/27/11    Historical Provider, MD   HYDROcodone-acetaminophen (NORCO) 5-325 MG per tablet  Take 1.5 tablets by mouth every 6 (six) hours as needed.  05/23/11  06/02/11   Nimish C Gosrani   senna-docusate (SENOKOT-S) 8.6-50 MG per tablet  Take 1 tablet by mouth daily as needed for constipation.  05/18/11  05/17/12   Belkys Regalado, MD   Allergies: No Known Allergies  History reviewed. No pertinent family history.  Social History: reports that she has never smoked. She does not have any smokeless tobacco history on file. She reports that she does not drink alcohol or use illicit drugs. She is currently in [facility] nursing [facility].  ROS:  Constitutional: Denies fever, chills, diaphoresis, appetite change and fatigue.  HEENT: Denies photophobia, eye pain, redness, hearing loss, ear pain, congestion, sore throat, rhinorrhea, sneezing, mouth sores, trouble swallowing, neck pain, neck stiffness and tinnitus.  Cardiovascular: Denies chest pain, palpitations and leg swelling.  Gastrointestinal: Denies nausea, vomiting, abdominal pain, diarrhea, constipation, blood in stool and abdominal distention.  Genitourinary: Denies dysuria, urgency, frequency, hematuria, flank pain and difficulty urinating.  Musculoskeletal: Denies myalgias, back pain, joint swelling, arthralgias and gait problem.  Skin: Denies pallor, rash and wound.  Neurological: Denies dizziness, seizures, syncope, weakness,  light-headedness, numbness and headaches.  Hematological: Denies adenopathy. Easy bruising, personal or family bleeding history  Psychiatric/Behavioral: Denies suicidal ideation, mood changes, confusion, nervousness, sleep disturbance and agitation  PHYSICAL EXAM:  Blood pressure 151/74, pulse 60, temperature 98.9 F (37.2 C), resp. rate 18, height 5' 5" (1.651 m), weight 81.647 kg (180 lb), SpO2 93.00%.  Constitutional: Vital signs reviewed. Patient is a well-developed and well-nourished ,and cooperative with exam. Alert and oriented x3.  Head: Normocephalic and atraumatic  Ear: TM normal bilaterally  Mouth: no erythema or exudates, MMM  Eyes: PERRL,   EOMI, conjunctivae normal, No scleral icterus.  Neck: Supple, Trachea midline normal ROM, No JVD, mass, thyromegaly, or carotid bruit present.  Cardiovascular: RRR, S1 normal, S2 normal, no MRG, pulses symmetric and intact bilaterally  Pulmonary/Chest: CTAB, positive for Wheezing , rales, or rhonchi. Mild to moderate accessory muscle use .  Abdominal: Soft. Non-tender, non-distended, bowel sounds are normal, no masses, organomegaly, or guarding present.  GU: no CVA tenderness Musculoskeletal: No joint deformities, erythema, or stiffness, ROM full and no nontender Hematology: no cervical, inginal, or axillary adenopathy.  Neurological: A&O x3, Strenght is normal and symmetric bilaterally, cranial nerve II-XII are grossly intact, no focal motor deficit, sensory intact to light touch bilaterally.  Skin: Warm, dry and intact. No rash, cyanosis, or clubbing.  Psychiatric: Normal mood and affect. speech and behavior is normal. Judgment and thought content normal. Cognition and memory are normal.  EKG: Shows a paced rhythm at a rate of 65 beats per minute with no acute ST-T segment changes.  Results for orders placed during the hospital encounter of 06/03/11 (from the past 48 hour(s))   CBC Status: Abnormal    Collection Time    06/03/11 10:15 AM     Component  Value  Range  Comment    WBC  9.9  4.0 - 10.5 (K/uL)     RBC  4.17  3.87 - 5.11 (MIL/uL)     Hemoglobin  12.5  12.0 - 15.0 (g/dL)     HCT  38.4  36.0 - 46.0 (%)     MCV  92.1  78.0 - 100.0 (fL)     MCH  30.0  26.0 - 34.0 (pg)     MCHC  32.6  30.0 - 36.0 (g/dL)     RDW  16.6 (*)  11.5 - 15.5 (%)     Platelets  180  150 - 400 (K/uL)    DIFFERENTIAL Status: Abnormal    Collection Time    06/03/11 10:15 AM   Component  Value  Range  Comment    Neutrophils Relative  83 (*)  43 - 77 (%)     Neutro Abs  8.2 (*)  1.7 - 7.7 (K/uL)     Lymphocytes Relative  5 (*)  12 - 46 (%)     Lymphs Abs  0.5 (*)  0.7 - 4.0 (K/uL)     Monocytes Relative  12  3 - 12 (%)     Monocytes Absolute  1.2 (*)  0.1 - 1.0 (K/uL)     Eosinophils Relative  0  0 - 5 (%)     Eosinophils Absolute  0.0  0.0 - 0.7 (K/uL)     Basophils Relative  0  0 - 1 (%)     Basophils Absolute  0.0  0.0 - 0.1 (K/uL)    BASIC METABOLIC PANEL Status: Abnormal    Collection Time    06/03/11 10:15 AM   Component  Value  Range  Comment    Sodium  135  135 - 145 (mEq/L)     Potassium  3.9  3.5 - 5.1 (mEq/L)     Chloride  99  96 - 112 (mEq/L)     CO2  24  19 - 32 (mEq/L)     Glucose, Bld  126 (*)  70 - 99 (mg/dL)     BUN  20  6 - 23 (mg/dL)     Creatinine, Ser  0.94  0.50 - 1.10 (mg/dL)     Calcium    9.6  8.4 - 10.5 (mg/dL)     GFR calc non Af Amer  57 (*)  >60 (mL/min)     GFR calc Af Amer  >60  >60 (mL/min)    CARDIAC PANEL(CRET KIN+CKTOT+MB+TROPI) Status: Normal    Collection Time    06/03/11 10:15 AM   Component  Value  Range  Comment    Total CK  36  7 - 177 (U/L)     CK, MB  1.3  0.3 - 4.0 (ng/mL)     Troponin I  <0.30  <0.30 (ng/mL)     Relative Index  RELATIVE INDEX IS INVALID  0.0 - 2.5    URINALYSIS, ROUTINE W REFLEX MICROSCOPIC Status: Abnormal    Collection Time    06/03/11 11:42 AM   Component  Value  Range  Comment    Color, Urine  YELLOW  YELLOW     Appearance  HAZY (*)  CLEAR     Specific Gravity, Urine   1.020  1.005 - 1.030     pH  7.0  5.0 - 8.0     Glucose, UA  NEGATIVE  NEGATIVE (mg/dL)     Hgb urine dipstick  NEGATIVE  NEGATIVE     Bilirubin Urine  NEGATIVE  NEGATIVE     Ketones, ur  NEGATIVE  NEGATIVE (mg/dL)     Protein, ur  NEGATIVE  NEGATIVE (mg/dL)     Urobilinogen, UA  0.2  0.0 - 1.0 (mg/dL)     Nitrite  NEGATIVE  NEGATIVE     Leukocytes, UA  NEGATIVE  NEGATIVE  MICROSCOPIC NOT DONE ON URINES WITH NEGATIVE PROTEIN, BLOOD, LEUKOCYTES, NITRITE, OR GLUCOSE <1000 mg/dL.    Dg Chest 1 View  06/01/2011 *RADIOLOGY REPORT* IMPRESSION: 1. Cardiomegaly with slight increase in pulmonary interstitial prominence. Suspect mild pulmonary venous congestion, without overt congestive failure. 2. Increased density projecting over the left lung base. Could be due to overlying soft tissues. Early airspace disease cannot be excluded. PA and lateral radiographs may be informative. Original Report Authenticated By: KYLE D. TALBOT, M.D.  Dg Chest Portable 1 View  06/03/2011 . IMPRESSION: 1. Increased pulmonary edema and left retrocardiac atelectasis. 2. Stable cardiomegaly. Original Report Authenticated By: JOHN A. STAHL, M.D.  Impression:  Principal Problem:  1. CHF exacerbation: Patient shortness of breath is most likely consistent with diastolic heart failure she also has a history of paroxysmal atrial fibrillation however she is in normal sinus rhythm currently. Given her recent history of left ankle fracture we will also go ahead and rule out an DVT and a PE. The patient does appear to be volume overloaded clinically and therefore we will start her on IV Lasix are the ordered a 2-D echo. We will cycle her cardiac enzymes. Given the fact that she has some mild accessory muscle use will go ahead and monitor her in a step down bed for tonight. We will start her on diuresing with Lasix check her thyroid function, check a BNP, check a d-dimer, was started on nebulizer treatments for wheezing.  2. Chronic pain  syndrome secondary to ARTHRITIS, LUMBOSACRAL SPINE, Sciatica  3 . PAF (paroxysmal atrial fibrillation) patient will continue with the amiodarone and Coumadin per pharmacy protocol.  4. Sick sinus syndrome: Patient has a pacemaker for sick sinus syndrome and we will continue to monitor her on telemetry  5. HTN (hypertension): This appears to be fairly well controlled however patient is currently on a nitroglycerin drip in the ED we will titrate

## 2011-06-03 NOTE — Progress Notes (Addendum)
ANTICOAGULATION CONSULT NOTE - Initial Consult  Pharmacy Consult for Warfarin Indication: atrial fibrillation  No Known Allergies  Patient Measurements: Height: 5\' 5"  (165.1 cm) Weight: 180 lb (81.647 kg) IBW/kg (Calculated) : 57   Vital Signs: Temp: 98.9 F (37.2 C) (09/30 0920) BP: 135/54 mmHg (09/30 1400) Pulse Rate: 65  (09/30 1400)  Labs:  Basename 06/03/11 1015  HGB 12.5  HCT 38.4  PLT 180  APTT --  LABPROT --  INR --  HEPARINUNFRC --  CREATININE 0.94  CRCLEARANCE --  CKTOTAL 36  CKMB 1.3  TROPONINI <0.30   Estimated Creatinine Clearance: 48.7 ml/min (by C-G formula based on Cr of 0.94).  Medical History: Past Medical History  Diagnosis Date  . Hypertension   . High cholesterol   . Arthritis   . Hypothyroidism   . Coronary artery disease   . Arrhythmia     pacemaker  . Glaucoma     Medications:  Medications Prior to Admission  Medication Dose Route Frequency Provider Last Rate Last Dose  . acetaminophen (TYLENOL) tablet 650 mg  650 mg Oral Q6H PRN Nayana Abrol      . albuterol (PROVENTIL) (5 MG/ML) 0.5% nebulizer solution 5 mg  5 mg Nebulization Once Nicholes Stairs, MD   5 mg at 06/03/11 0959  . albuterol (PROVENTIL) (5 MG/ML) 0.5% nebulizer solution 5 mg  5 mg Nebulization Q6H Nayana Abrol      . amiodarone (PACERONE) tablet 200 mg  200 mg Oral Daily Nayana Abrol      . bimatoprost (LUMIGAN) 0.03 % ophthalmic solution 1 drop  1 drop Both Eyes QHS Nayana Abrol      . brimonidine (ALPHAGAN) 0.15 % ophthalmic solution 1 drop  1 drop Both Eyes BID Nayana Abrol      . calcium-vitamin D (OSCAL WITH D) 500-200 MG-UNIT per tablet 1 tablet  1 tablet Oral BID Nayana Abrol      . docusate sodium (COLACE) capsule 100 mg  100 mg Oral BID Nayana Abrol      . dorzolamide-timolol (COSOPT) 22.3-6.8 MG/ML ophthalmic solution 1 drop  1 drop Both Eyes BID Nayana Abrol      . fluconazole (DIFLUCAN) tablet 100 mg  100 mg Oral Daily Nayana Abrol      . furosemide  (LASIX) 10 MG/ML injection 40 mg  40 mg Intravenous Once Nicholes Stairs, MD   40 mg at 06/03/11 1120  . furosemide (LASIX) 10 MG/ML injection 40 mg  40 mg Intravenous Daily Nayana Abrol      . gabapentin (NEURONTIN) capsule 300 mg  300 mg Oral TID Nayana Abrol      . guaiFENesin-dextromethorphan (ROBITUSSIN DM) 100-10 MG/5ML syrup 5 mL  5 mL Oral Q4H PRN Nayana Abrol      . ipratropium (ATROVENT) 0.02 % nebulizer solution 0.5 mg  0.5 mg Nebulization Once Nicholes Stairs, MD   0.5 mg at 06/03/11 0956  . ipratropium (ATROVENT) 0.02 % nebulizer solution 0.5 mg  0.5 mg Nebulization Q6H Nayana Abrol      . levothyroxine (SYNTHROID, LEVOTHROID) tablet 100 mcg  100 mcg Oral Daily Nayana Abrol      . lidocaine (LIDODERM) 5 % 1 patch  1 patch Transdermal Q24H Nayana Abrol      . metoprolol (TOPROL-XL) 24 hr tablet 50 mg  50 mg Oral Daily Nayana Abrol      . nitroGLYCERIN 0.2 mg/mL in dextrose 5 % infusion  5 mcg/min Intravenous Titrated Nayana Abrol      .  nystatin (MYCOSTATIN) 100000 UNIT/ML suspension 500,000 Units  500,000 Units Oral QID Nayana Abrol      . ondansetron (ZOFRAN) tablet 4 mg  4 mg Oral Q6H PRN Nayana Abrol       Or  . ondansetron (ZOFRAN) injection 4 mg  4 mg Intravenous Q6H PRN Nayana Abrol      . polyethylene glycol (MIRALAX / GLYCOLAX) packet 17 g  17 g Oral Daily Nayana Abrol      . predniSONE (DELTASONE) tablet 10 mg  10 mg Oral Daily Nayana Abrol      . saccharomyces boulardii (FLORASTOR) capsule 250 mg  250 mg Oral Daily Nayana Abrol      . senna (SENOKOT) tablet 17.2 mg  2 tablet Oral Daily PRN Nayana Abrol      . simvastatin (ZOCOR) tablet 10 mg  10 mg Oral q1800 Nayana Abrol      . sodium chloride 0.9 % injection 3 mL  3 mL Intravenous Q12H Nayana Abrol      . sodium chloride 0.9 % injection 3 mL  3 mL Intravenous PRN Nayana Abrol      . warfarin (COUMADIN) tablet 2 mg  2 mg Oral q1800 Nayana Abrol      . DISCONTD: 0.9 %  sodium chloride infusion   Intravenous  Continuous Nicholes Stairs, MD 125 mL/hr at 06/03/11 1015    . DISCONTD: 0.9 %  sodium chloride infusion  250 mL Intravenous Continuous Nayana Abrol      . DISCONTD: acetaminophen (TYLENOL) suppository 650 mg  650 mg Rectal Q6H PRN Nayana Abrol      . DISCONTD: albuterol (PROVENTIL) (5 MG/ML) 0.5% nebulizer solution 2.5 mg  2.5 mg Nebulization Once Nicholes Stairs, MD      . DISCONTD: nitroGLYCERIN 0.2 mg/mL in dextrose 5 % infusion  5 mcg/min Intravenous Titrated Nicholes Stairs, MD 1.5 mL/hr at 06/03/11 1120 5 mcg/min at 06/03/11 1120  . DISCONTD: sodium chloride 0.9 % injection 10 mL  10 mL Intravenous Once Nicholes Stairs, MD       Medications Prior to Admission  Medication Sig Dispense Refill  . acidophilus (RISAQUAD) CAPS Take 1 capsule by mouth daily.        . ALPHAGAN P 0.1 % SOLN Place 1 drop into both eyes 2 (two) times daily.       Marland Kitchen amiodarone (PACERONE) 200 MG tablet Take 200 mg by mouth daily.       Marland Kitchen amLODipine (NORVASC) 5 MG tablet Take 5 mg by mouth daily.       . dorzolamide-timolol (COSOPT) 22.3-6.8 MG/ML ophthalmic solution Place 1 drop into both eyes 2 (two) times daily.       Marland Kitchen gabapentin (NEURONTIN) 300 MG capsule Take 1 capsule (300 mg total) by mouth 3 (three) times daily.      Marland Kitchen guaiFENesin (MUCINEX) 600 MG 12 hr tablet Take 600 mg by mouth 2 (two) times daily.        Marland Kitchen lidocaine (LIDODERM) 5 % Place 1 patch onto the skin daily. Remove & Discard patch within 12 hours or as directed by MD      . LUMIGAN 0.03 % ophthalmic solution Place 1 drop into both eyes at bedtime.       . metoprolol (TOPROL-XL) 50 MG 24 hr tablet Take 50 mg by mouth daily.       . pravastatin (PRAVACHOL) 20 MG tablet Take 20 mg by mouth daily.       Marland Kitchen  SYNTHROID 100 MCG tablet Take 100 mcg by mouth daily.       Marland Kitchen warfarin (COUMADIN) 2 MG tablet Take 2 mg by mouth daily.        Marland Kitchen ESTRACE VAGINAL 0.1 MG/GM vaginal cream Place 2 g vaginally daily as needed.       Marland Kitchen  HYDROcodone-acetaminophen (NORCO) 5-325 MG per tablet Take 1.5 tablets by mouth every 6 (six) hours as needed.  30 tablet  0  . senna-docusate (SENOKOT-S) 8.6-50 MG per tablet Take 1 tablet by mouth daily as needed for constipation.  30 tablet  0    Assessment: Okay for Protocol Baseline INR Pending  Goal of Therapy:  INR 2-3   Plan:  Warfarin home dose continued at 2mg  Daily.  Will follow-up INR and adjust if needed.  INR Last Three Days: Recent Labs  [facility] 06/03/11 1437   INR 1.46   Will change dose to 4mg  po x 1.  F/U am labs.  Lamonte Richer R 06/03/2011,2:36 PM

## 2011-06-03 NOTE — H&P (Signed)
Bassheva J Mellinger  MRN: [id number]  DOB/AGE: 11/14/1926 77 y.o.  Primary Care Physician:FUSCO,LAWRENCE J., MD  Admit date: 06/03/2011  Chief Complaint: Shortness of breath  HPI: This is an 77-year-old female with a history of paroxismal atrial fibrillation recent history of left ankle fracture, recently discharged and sent [facility] for rehabilitation, who presents to the ER with a chief complaint of shortness of breath. Her symptoms started about 3-4 days ago. She initially developed a nonproductive cough associated with some wheezing. She then started noticing bilateral lower extremities edema. She subsequently started experiencing dyspnea on exertion progressing to dyspnea at rest . She denied any fever chills or rigors. She denied any chest pain but has experienced significant orthopnea and paroxysmal nocturnal dyspnea. She denies any nausea vomiting abdominal pain per se . She denies any prior history of DVT or PE .  Past Medical History   Diagnosis  Date   .  Hypertension    .  High cholesterol    .  Arthritis    .  Hypothyroidism    .  Coronary artery disease    .  Arrhythmia      pacemaker   .  Glaucoma     Past Surgical History   Procedure  Date   .  Total hip arthroplasty      right side   .  Pacemaker insertion    .  Appendectomy    .  Abdominal hysterectomy     Prior to Admission medications   Medication  Sig  Start Date  End Date  Taking?  Authorizing Provider   acidophilus (RISAQUAD) CAPS  Take 1 capsule by mouth daily.  06/02/11  06/09/11  Yes  Historical Provider, MD   ALPHAGAN P 0.1 % SOLN  Place 1 drop into both eyes 2 (two) times daily.  04/09/11   Yes  Historical Provider, MD   amiodarone (PACERONE) 200 MG tablet  Take 200 mg by mouth daily.  04/30/11   Yes  Historical Provider, MD   amLODipine (NORVASC) 5 MG tablet  Take 5 mg by mouth daily.  04/01/11   Yes  Historical Provider, MD   amoxicillin-clavulanate (AUGMENTIN) 500-125 MG per tablet  Take 1 tablet by  mouth 2 (two) times daily.  06/02/11  06/09/11  Yes  Historical Provider, MD   calcium-vitamin D (OSCAL WITH D) 500-200 MG-UNIT per tablet  Take 1 tablet by mouth 2 (two) times daily.    Yes  Historical Provider, MD   dorzolamide-timolol (COSOPT) 22.3-6.8 MG/ML ophthalmic solution  Place 1 drop into both eyes 2 (two) times daily.  04/09/11   Yes  Historical Provider, MD   fluconazole (DIFLUCAN) 100 MG tablet  Take 100 mg by mouth daily. Started 05/29/11 until 06/03/11    Yes  Historical Provider, MD   gabapentin (NEURONTIN) 300 MG capsule  Take 1 capsule (300 mg total) by mouth 3 (three) times daily.  05/18/11  05/17/12  Yes  Belkys Regalado, MD   guaiFENesin (MUCINEX) 600 MG 12 hr tablet  Take 600 mg by mouth 2 (two) times daily.  06/01/11  06/05/11  Yes  Historical Provider, MD   HYDROcodone-acetaminophen (NORCO) 5-325 MG per tablet  Take 1.5 tablets by mouth every 4 (four) hours as needed. Pain    Yes  Historical Provider, MD   ipratropium-albuterol (DUONEB) 0.5-2.5 (3) MG/3ML SOLN  Take 3 mLs by nebulization every 4 (four) hours as needed. Shortness of Breath/Wheezing    Yes  Historical   Provider, MD   lidocaine (LIDODERM) 5 %  Place 1 patch onto the skin daily. Remove & Discard patch within 12 hours or as directed by MD    Yes  Historical Provider, MD   LUMIGAN 0.03 % ophthalmic solution  Place 1 drop into both eyes at bedtime.  04/24/11   Yes  Historical Provider, MD   metoprolol (TOPROL-XL) 50 MG 24 hr tablet  Take 50 mg by mouth daily.  04/09/11   Yes  Historical Provider, MD   nystatin (MYCOSTATIN) 100000 UNIT/ML suspension  Take 500,000 Units by mouth 4 (four) times daily. Started on 05/26/11 ended on 06/02/11    Yes  Historical Provider, MD   polyethylene glycol (MIRALAX / GLYCOLAX) packet  Take 17 g by mouth daily.    Yes  Historical Provider, MD   pravastatin (PRAVACHOL) 20 MG tablet  Take 20 mg by mouth daily.  04/14/11   Yes  Historical Provider, MD   predniSONE (DELTASONE) 10 MG tablet  Take 10 mg  by mouth daily. Patient started taper on 05/24/11 and ended on 05/29/11. Take 3 tablets daily for 2 days, then 2 tablets daily for 2 days, then 1 tablet daily for 1 day, then take 0.5 tablet daily for last day.    Yes  Historical Provider, MD   SYNTHROID 100 MCG tablet  Take 100 mcg by mouth daily.  04/11/11   Yes  Historical Provider, MD   warfarin (COUMADIN) 2 MG tablet  Take 2 mg by mouth daily.  05/23/11  05/22/12  Yes  Nimish C Gosrani   ESTRACE VAGINAL 0.1 MG/GM vaginal cream  Place 2 g vaginally daily as needed.  04/27/11    Historical Provider, MD   HYDROcodone-acetaminophen (NORCO) 5-325 MG per tablet  Take 1.5 tablets by mouth every 6 (six) hours as needed.  05/23/11  06/02/11   Nimish C Gosrani   senna-docusate (SENOKOT-S) 8.6-50 MG per tablet  Take 1 tablet by mouth daily as needed for constipation.  05/18/11  05/17/12   Belkys Regalado, MD   Allergies: No Known Allergies  History reviewed. No pertinent family history.  Social History: reports that she has never smoked. She does not have any smokeless tobacco history on file. She reports that she does not drink alcohol or use illicit drugs. She is currently in [facility] nursing [facility].  ROS:  Constitutional: Denies fever, chills, diaphoresis, appetite change and fatigue.  HEENT: Denies photophobia, eye pain, redness, hearing loss, ear pain, congestion, sore throat, rhinorrhea, sneezing, mouth sores, trouble swallowing, neck pain, neck stiffness and tinnitus.  Cardiovascular: Denies chest pain, palpitations and leg swelling.  Gastrointestinal: Denies nausea, vomiting, abdominal pain, diarrhea, constipation, blood in stool and abdominal distention.  Genitourinary: Denies dysuria, urgency, frequency, hematuria, flank pain and difficulty urinating.  Musculoskeletal: Denies myalgias, back pain, joint swelling, arthralgias and gait problem.  Skin: Denies pallor, rash and wound.  Neurological: Denies dizziness, seizures, syncope, weakness,  light-headedness, numbness and headaches.  Hematological: Denies adenopathy. Easy bruising, personal or family bleeding history  Psychiatric/Behavioral: Denies suicidal ideation, mood changes, confusion, nervousness, sleep disturbance and agitation  PHYSICAL EXAM:  Blood pressure 151/74, pulse 60, temperature 98.9 F (37.2 C), resp. rate 18, height 5' 5" (1.651 m), weight 81.647 kg (180 lb), SpO2 93.00%.  Constitutional: Vital signs reviewed. Patient is a well-developed and well-nourished ,and cooperative with exam. Alert and oriented x3.  Head: Normocephalic and atraumatic  Ear: TM normal bilaterally  Mouth: no erythema or exudates, MMM  Eyes: PERRL,   EOMI, conjunctivae normal, No scleral icterus.  Neck: Supple, Trachea midline normal ROM, No JVD, mass, thyromegaly, or carotid bruit present.  Cardiovascular: RRR, S1 normal, S2 normal, no MRG, pulses symmetric and intact bilaterally  Pulmonary/Chest: CTAB, positive for Wheezing , rales, or rhonchi. Mild to moderate accessory muscle use .  Abdominal: Soft. Non-tender, non-distended, bowel sounds are normal, no masses, organomegaly, or guarding present.  GU: no CVA tenderness Musculoskeletal: No joint deformities, erythema, or stiffness, ROM full and no nontender Hematology: no cervical, inginal, or axillary adenopathy.  Neurological: A&O x3, Strenght is normal and symmetric bilaterally, cranial nerve II-XII are grossly intact, no focal motor deficit, sensory intact to light touch bilaterally.  Skin: Warm, dry and intact. No rash, cyanosis, or clubbing.  Psychiatric: Normal mood and affect. speech and behavior is normal. Judgment and thought content normal. Cognition and memory are normal.  EKG: Shows a paced rhythm at a rate of 65 beats per minute with no acute ST-T segment changes.  Results for orders placed during the hospital encounter of 06/03/11 (from the past 48 hour(s))   CBC Status: Abnormal    Collection Time    06/03/11 10:15 AM     Component  Value  Range  Comment    WBC  9.9  4.0 - 10.5 (K/uL)     RBC  4.17  3.87 - 5.11 (MIL/uL)     Hemoglobin  12.5  12.0 - 15.0 (g/dL)     HCT  38.4  36.0 - 46.0 (%)     MCV  92.1  78.0 - 100.0 (fL)     MCH  30.0  26.0 - 34.0 (pg)     MCHC  32.6  30.0 - 36.0 (g/dL)     RDW  16.6 (*)  11.5 - 15.5 (%)     Platelets  180  150 - 400 (K/uL)    DIFFERENTIAL Status: Abnormal    Collection Time    06/03/11 10:15 AM   Component  Value  Range  Comment    Neutrophils Relative  83 (*)  43 - 77 (%)     Neutro Abs  8.2 (*)  1.7 - 7.7 (K/uL)     Lymphocytes Relative  5 (*)  12 - 46 (%)     Lymphs Abs  0.5 (*)  0.7 - 4.0 (K/uL)     Monocytes Relative  12  3 - 12 (%)     Monocytes Absolute  1.2 (*)  0.1 - 1.0 (K/uL)     Eosinophils Relative  0  0 - 5 (%)     Eosinophils Absolute  0.0  0.0 - 0.7 (K/uL)     Basophils Relative  0  0 - 1 (%)     Basophils Absolute  0.0  0.0 - 0.1 (K/uL)    BASIC METABOLIC PANEL Status: Abnormal    Collection Time    06/03/11 10:15 AM   Component  Value  Range  Comment    Sodium  135  135 - 145 (mEq/L)     Potassium  3.9  3.5 - 5.1 (mEq/L)     Chloride  99  96 - 112 (mEq/L)     CO2  24  19 - 32 (mEq/L)     Glucose, Bld  126 (*)  70 - 99 (mg/dL)     BUN  20  6 - 23 (mg/dL)     Creatinine, Ser  0.94  0.50 - 1.10 (mg/dL)     Calcium    9.6  8.4 - 10.5 (mg/dL)     GFR calc non Af Amer  57 (*)  >60 (mL/min)     GFR calc Af Amer  >60  >60 (mL/min)    CARDIAC PANEL(CRET KIN+CKTOT+MB+TROPI) Status: Normal    Collection Time    06/03/11 10:15 AM   Component  Value  Range  Comment    Total CK  36  7 - 177 (U/L)     CK, MB  1.3  0.3 - 4.0 (ng/mL)     Troponin I  <0.30  <0.30 (ng/mL)     Relative Index  RELATIVE INDEX IS INVALID  0.0 - 2.5    URINALYSIS, ROUTINE W REFLEX MICROSCOPIC Status: Abnormal    Collection Time    06/03/11 11:42 AM   Component  Value  Range  Comment    Color, Urine  YELLOW  YELLOW     Appearance  HAZY (*)  CLEAR     Specific Gravity, Urine   1.020  1.005 - 1.030     pH  7.0  5.0 - 8.0     Glucose, UA  NEGATIVE  NEGATIVE (mg/dL)     Hgb urine dipstick  NEGATIVE  NEGATIVE     Bilirubin Urine  NEGATIVE  NEGATIVE     Ketones, ur  NEGATIVE  NEGATIVE (mg/dL)     Protein, ur  NEGATIVE  NEGATIVE (mg/dL)     Urobilinogen, UA  0.2  0.0 - 1.0 (mg/dL)     Nitrite  NEGATIVE  NEGATIVE     Leukocytes, UA  NEGATIVE  NEGATIVE  MICROSCOPIC NOT DONE ON URINES WITH NEGATIVE PROTEIN, BLOOD, LEUKOCYTES, NITRITE, OR GLUCOSE <1000 mg/dL.    Dg Chest 1 View  06/01/2011 *RADIOLOGY REPORT* IMPRESSION: 1. Cardiomegaly with slight increase in pulmonary interstitial prominence. Suspect mild pulmonary venous congestion, without overt congestive failure. 2. Increased density projecting over the left lung base. Could be due to overlying soft tissues. Early airspace disease cannot be excluded. PA and lateral radiographs may be informative. Original Report Authenticated By: KYLE D. TALBOT, M.D.  Dg Chest Portable 1 View  06/03/2011 . IMPRESSION: 1. Increased pulmonary edema and left retrocardiac atelectasis. 2. Stable cardiomegaly. Original Report Authenticated By: JOHN A. STAHL, M.D.  Impression:  Principal Problem:  1. CHF exacerbation: Patient shortness of breath is most likely consistent with diastolic heart failure she also has a history of paroxysmal atrial fibrillation however she is in normal sinus rhythm currently. Given her recent history of left ankle fracture we will also go ahead and rule out an DVT and a PE. The patient does appear to be volume overloaded clinically and therefore we will start her on IV Lasix are the ordered a 2-D echo. We will cycle her cardiac enzymes. Given the fact that she has some mild accessory muscle use will go ahead and monitor her in a step down bed for tonight. We will start her on diuresing with Lasix check her thyroid function, check a BNP, check a d-dimer, was started on nebulizer treatments for wheezing.  2. Chronic pain  syndrome secondary to ARTHRITIS, LUMBOSACRAL SPINE, Sciatica  3 . PAF (paroxysmal atrial fibrillation) patient will continue with the amiodarone and Coumadin per pharmacy protocol.  4. Sick sinus syndrome: Patient has a pacemaker for sick sinus syndrome and we will continue to monitor her on telemetry  5. HTN (hypertension): This appears to be fairly well controlled however patient is currently on a nitroglycerin drip in the ED we will titrate

## 2011-06-03 NOTE — Progress Notes (Signed)
Patient requests no visitors except husband or sons. Sign placed on door.

## 2011-06-04 ENCOUNTER — Ambulatory Visit (HOSPITAL_COMMUNITY): Payer: Medicare Other

## 2011-06-04 ENCOUNTER — Inpatient Hospital Stay (HOSPITAL_COMMUNITY): Payer: Medicare Other

## 2011-06-04 DIAGNOSIS — I2699 Other pulmonary embolism without acute cor pulmonale: Secondary | ICD-10-CM

## 2011-06-04 HISTORY — DX: Other pulmonary embolism without acute cor pulmonale: I26.99

## 2011-06-04 LAB — CARDIAC PANEL(CRET KIN+CKTOT+MB+TROPI)
CK, MB: 1.5 ng/mL (ref 0.3–4.0)
CK, MB: 1.3 ng/mL (ref 0.3–4.0)
Relative Index: INVALID (ref 0.0–2.5)
Total CK: 47 U/L (ref 7–177)
Troponin I: <0.3 ng/mL (ref <0.30)
Troponin I: <0.3 ng/mL (ref <0.30)

## 2011-06-04 LAB — CBC
HCT: 34.1 % — ABNORMAL LOW (ref 36.0–46.0)
Hemoglobin: 11 g/dL — ABNORMAL LOW (ref 12.0–15.0)
MCV: 91.4 fL (ref 78.0–100.0)
RDW: 16 % — ABNORMAL HIGH (ref 11.5–15.5)
WBC: 6.5 10*3/uL (ref 4.0–10.5)

## 2011-06-04 LAB — BLOOD GAS, ARTERIAL
Acid-base deficit: 0.7 mmol/L (ref 0.0–2.0)
Bicarbonate: 22.3 mEq/L (ref 20.0–24.0)
TCO2: 19.9 mmol/L (ref 0–100)
pCO2 arterial: 29.5 mmHg — ABNORMAL LOW (ref 35.0–45.0)
pH, Arterial: 7.491 — ABNORMAL HIGH (ref 7.350–7.400)
pO2, Arterial: 62.3 mmHg — ABNORMAL LOW (ref 80.0–100.0)

## 2011-06-04 LAB — COMPREHENSIVE METABOLIC PANEL
ALT: 20 U/L (ref 0–35)
AST: 19 U/L (ref 0–37)
Albumin: 2.6 g/dL — ABNORMAL LOW (ref 3.5–5.2)
Alkaline Phosphatase: 81 U/L (ref 39–117)
BUN: 17 mg/dL (ref 6–23)
Chloride: 101 mEq/L (ref 96–112)
Potassium: 3.7 mEq/L (ref 3.5–5.1)
Sodium: 134 mEq/L — ABNORMAL LOW (ref 135–145)
Total Bilirubin: 0.5 mg/dL (ref 0.3–1.2)
Total Protein: 5.6 g/dL — ABNORMAL LOW (ref 6.0–8.3)

## 2011-06-04 LAB — PROTIME-INR: INR: 1.51 — ABNORMAL HIGH (ref 0.00–1.49)

## 2011-06-04 MED ORDER — POTASSIUM CHLORIDE CRYS ER 20 MEQ PO TBCR
20.0000 meq | EXTENDED_RELEASE_TABLET | Freq: Two times a day (BID) | ORAL | Status: DC
  Administered 2011-06-04 – 2011-06-08 (×9): 20 meq via ORAL
  Filled 2011-06-04 (×9): qty 1

## 2011-06-04 MED ORDER — ENOXAPARIN SODIUM 40 MG/0.4ML ~~LOC~~ SOLN
40.0000 mg | Freq: Every day | SUBCUTANEOUS | Status: DC
  Administered 2011-06-04: 40 mg via SUBCUTANEOUS
  Filled 2011-06-04: qty 0.4

## 2011-06-04 MED ORDER — WARFARIN SODIUM 2 MG PO TABS
4.0000 mg | ORAL_TABLET | Freq: Once | ORAL | Status: AC
  Administered 2011-06-04: 4 mg via ORAL
  Filled 2011-06-04: qty 2

## 2011-06-04 MED ORDER — IOHEXOL 350 MG/ML SOLN
100.0000 mL | Freq: Once | INTRAVENOUS | Status: AC | PRN
  Administered 2011-06-04: 100 mL via INTRAVENOUS

## 2011-06-04 MED ORDER — SODIUM CHLORIDE 0.9 % IJ SOLN
INTRAMUSCULAR | Status: AC
  Administered 2011-06-04: 02:00:00
  Filled 2011-06-04: qty 10

## 2011-06-04 MED ORDER — ENOXAPARIN SODIUM 80 MG/0.8ML ~~LOC~~ SOLN
1.0000 mg/kg | Freq: Two times a day (BID) | SUBCUTANEOUS | Status: DC
  Administered 2011-06-04 – 2011-06-06 (×4): 75 mg via SUBCUTANEOUS
  Filled 2011-06-04 (×4): qty 0.8

## 2011-06-04 MED ORDER — SODIUM CHLORIDE 0.9 % IJ SOLN
INTRAMUSCULAR | Status: AC
  Administered 2011-06-04: 10 mL
  Filled 2011-06-04: qty 10

## 2011-06-04 MED ORDER — METHYLPREDNISOLONE SODIUM SUCC 40 MG IJ SOLR
40.0000 mg | Freq: Two times a day (BID) | INTRAMUSCULAR | Status: DC
  Administered 2011-06-04 – 2011-06-06 (×5): 40 mg via INTRAVENOUS
  Filled 2011-06-04 (×5): qty 1

## 2011-06-04 NOTE — Progress Notes (Signed)
*  PRELIMINARY RESULTS* Echocardiogram 2D Echocardiogram has been performed.  [name] 06/04/2011, 4:04 PM

## 2011-06-04 NOTE — Progress Notes (Signed)
ANTICOAGULATION CONSULT NOTE - Initial Consult  Pharmacy Consult for Lovenox  Indication: pulmonary embolus  No Known Allergies  Patient Measurements: Height: 5\' 5"  (165.1 cm) Weight: 170 lb 3.1 oz (77.2 kg) IBW/kg (Calculated) : 57    Vital Signs: BP: 144/73 mmHg (10/01 0700) Pulse Rate: 65  (10/01 0700)  Labs:  Basename 06/04/11 0747 06/04/11 0308 06/04/11 0307 06/03/11 1437 06/03/11 1015  HGB -- 11.0* -- -- 12.5  HCT -- 34.1* -- -- 38.4  PLT -- 161 -- -- 180  APTT -- -- -- -- --  LABPROT -- 18.5* -- 18.0* --  INR -- 1.51* -- 1.46 --  HEPARINUNFRC -- -- -- -- --  CREATININE -- 0.81 -- -- 0.94  CRCLEARANCE -- -- -- -- --  CKTOTAL 49 -- 47 37 --  CKMB 1.3 -- 1.5 1.3 --  TROPONINI <0.30 -- <0.30 <0.30 --    Medical History: Past Medical History  Diagnosis Date  . Hypertension   . High cholesterol   . Arthritis   . Hypothyroidism   . Coronary artery disease   . Arrhythmia     pacemaker  . Glaucoma     Medications:  Scheduled:    . albuterol  2.5 mg Nebulization Q4H  . amiodarone  200 mg Oral Daily  . antiseptic oral rinse   Mouth Rinse Daily  . bimatoprost  1 drop Both Eyes QHS  . brimonidine  1 drop Both Eyes BID  . calcium-vitamin D  1 tablet Oral BID  . docusate sodium  100 mg Oral BID  . dorzolamide-timolol  1 drop Both Eyes BID  . enoxaparin (LOVENOX) injection  1 mg/kg Subcutaneous Q12H  . fluconazole  100 mg Oral Daily  . furosemide  40 mg Intravenous BID  . gabapentin  300 mg Oral TID  . ipratropium  0.5 mg Nebulization Q4H  . levothyroxine  100 mcg Oral Daily  . lidocaine  1 patch Transdermal Q24H  . methylPREDNISolone sodium succinate  40 mg Intravenous Q12H  . metoprolol  50 mg Oral Daily  . nystatin  500,000 Units Oral QID  . polyethylene glycol  17 g Oral Daily  . potassium chloride  20 mEq Oral BID  . predniSONE  10 mg Oral Daily  . saccharomyces boulardii  250 mg Oral Daily  . simvastatin  10 mg Oral q1800  . sodium chloride  3 mL  Intravenous Q12H  . sodium chloride      . sodium chloride      . warfarin  4 mg Oral ONCE-1800  . warfarin  4 mg Oral ONCE-1800  . DISCONTD: albuterol  5 mg Nebulization Q6H  . DISCONTD: enoxaparin (LOVENOX) injection  40 mg Subcutaneous Daily  . DISCONTD: ipratropium  0.5 mg Nebulization Q6H    Assessment: Ok for protocol  Goal of Therapy:  Continue until INR 2.0 or greater   Plan:  Lovenox 1mg /kg every 12 hours Monitor renal function and adjust accordingly.   Raquel James, Keerat Denicola Bennett 06/04/2011,5:09 PM

## 2011-06-04 NOTE — Progress Notes (Signed)
Dr made aware of patient's ct results, orders given will continue to monitor patient closely. Mylia Pondexter [employer]

## 2011-06-04 NOTE — Progress Notes (Signed)
[facility] called and faxed Urine Culture results over to [place]. MD notified of positive urine culture. Sundae Maners [employer]

## 2011-06-04 NOTE — Progress Notes (Signed)
Subjective:  [name] is still quite short of breath today. She states that she feels somewhat better compared to yesterday. She has been afebrile overnight .She had a  good urine output of about 2500 cc overnight.  Objective: Vital signs in last 24 hours: Filed Vitals:   06/04/11 0500 06/04/11 0600 06/04/11 0700 06/04/11 0703  BP: 141/69 156/68 144/73   Pulse: 65 65 65   Temp:      TempSrc:      Resp: 19 17 20    Height:      Weight: 77.2 kg (170 lb 3.1 oz)     SpO2: 97% 97% 96% 95%    Intake/Output Summary (Last 24 hours) at 06/04/11 0737 Last data filed at 06/04/11 0600  Gross per 24 hour  Intake    500 ml  Output   2500 ml  Net  -2000 ml    Weight change:   Physical exam Gen. patient appears to be fatigued but in no acute cardiopulmonary distress today Lungs the bi-basilar wheezing and rhonchi at the bases Cardiovascular regular rate and rhythm exam  abdomen soft nontender nondistended Neurologic cranial nerves 2-12 grossly intact  Lab Results: Results for orders placed during the hospital encounter of 06/03/11 (from the past 48 hour(s))  CBC     Status: Abnormal   Collection Time   06/03/11 10:15 AM      Component Value Range Comment   WBC 9.9  4.0 - 10.5 (K/uL)    RBC 4.17  3.87 - 5.11 (MIL/uL)    Hemoglobin 12.5  12.0 - 15.0 (g/dL)    HCT 16.1  09.6 - 04.5 (%)    MCV 92.1  78.0 - 100.0 (fL)    MCH 30.0  26.0 - 34.0 (pg)    MCHC 32.6  30.0 - 36.0 (g/dL)    RDW 40.9 (*) 81.1 - 15.5 (%)    Platelets 180  150 - 400 (K/uL)   DIFFERENTIAL     Status: Abnormal   Collection Time   06/03/11 10:15 AM      Component Value Range Comment   Neutrophils Relative 83 (*) 43 - 77 (%)    Neutro Abs 8.2 (*) 1.7 - 7.7 (K/uL)    Lymphocytes Relative 5 (*) 12 - 46 (%)    Lymphs Abs 0.5 (*) 0.7 - 4.0 (K/uL)    Monocytes Relative 12  3 - 12 (%)    Monocytes Absolute 1.2 (*) 0.1 - 1.0 (K/uL)    Eosinophils Relative 0  0 - 5 (%)    Eosinophils Absolute 0.0  0.0 - 0.7 (K/uL)      Basophils Relative 0  0 - 1 (%)    Basophils Absolute 0.0  0.0 - 0.1 (K/uL)   BASIC METABOLIC PANEL     Status: Abnormal   Collection Time   06/03/11 10:15 AM      Component Value Range Comment   Sodium 135  135 - 145 (mEq/L)    Potassium 3.9  3.5 - 5.1 (mEq/L)    Chloride 99  96 - 112 (mEq/L)    CO2 24  19 - 32 (mEq/L)    Glucose, Bld 126 (*) 70 - 99 (mg/dL)    BUN 20  6 - 23 (mg/dL)    Creatinine, Ser 9.14  0.50 - 1.10 (mg/dL)    Calcium 9.6  8.4 - 10.5 (mg/dL)    GFR calc non Af Amer 57 (*) >60 (mL/min)    GFR calc Af Amer >60  >  60 (mL/min)   CARDIAC PANEL(CRET KIN+CKTOT+MB+TROPI)     Status: Normal   Collection Time   06/03/11 10:15 AM      Component Value Range Comment   Total CK 36  7 - 177 (U/L)    CK, MB 1.3  0.3 - 4.0 (ng/mL)    Troponin I <0.30  <0.30 (ng/mL)    Relative Index RELATIVE INDEX IS INVALID  0.0 - 2.5    URINALYSIS, ROUTINE W REFLEX MICROSCOPIC     Status: Abnormal   Collection Time   06/03/11 11:42 AM      Component Value Range Comment   Color, Urine YELLOW  YELLOW     Appearance HAZY (*) CLEAR     Specific Gravity, Urine 1.020  1.005 - 1.030     pH 7.0  5.0 - 8.0     Glucose, UA NEGATIVE  NEGATIVE (mg/dL)    Hgb urine dipstick NEGATIVE  NEGATIVE     Bilirubin Urine NEGATIVE  NEGATIVE     Ketones, ur NEGATIVE  NEGATIVE (mg/dL)    Protein, ur NEGATIVE  NEGATIVE (mg/dL)    Urobilinogen, UA 0.2  0.0 - 1.0 (mg/dL)    Nitrite NEGATIVE  NEGATIVE     Leukocytes, UA NEGATIVE  NEGATIVE  MICROSCOPIC NOT DONE ON URINES WITH NEGATIVE PROTEIN, BLOOD, LEUKOCYTES, NITRITE, OR GLUCOSE <1000 mg/dL.  TSH     Status: Normal   Collection Time   06/03/11  2:37 PM      Component Value Range Comment   TSH 3.629  0.350 - 4.500 (uIU/mL)    PRO B NATRIURETIC PEPTIDE     Status: Abnormal   Collection Time   06/03/11  2:37 PM      Component Value Range Comment   BNP, POC 5415.0 (*) 0 - 450 (pg/mL)   CARDIAC PANEL(CRET KIN+CKTOT+MB+TROPI)     Status: Normal   Collection  Time   06/03/11  2:37 PM      Component Value Range Comment   Total CK 37  7 - 177 (U/L)    CK, MB 1.3  0.3 - 4.0 (ng/mL)    Troponin I <0.30  <0.30 (ng/mL)    Relative Index RELATIVE INDEX IS INVALID  0.0 - 2.5    PROTIME-INR     Status: Abnormal   Collection Time   06/03/11  2:37 PM      Component Value Range Comment   Prothrombin Time 18.0 (*) 11.6 - 15.2 (seconds)    INR 1.46  0.00 - 1.49    MRSA PCR SCREENING     Status: Normal   Collection Time   06/03/11  3:55 PM      Component Value Range Comment   MRSA by PCR NEGATIVE  NEGATIVE    CARDIAC PANEL(CRET KIN+CKTOT+MB+TROPI)     Status: Normal   Collection Time   06/04/11  3:07 AM      Component Value Range Comment   Total CK 47  7 - 177 (U/L)    CK, MB 1.5  0.3 - 4.0 (ng/mL)    Troponin I <0.30  <0.30 (ng/mL)    Relative Index RELATIVE INDEX IS INVALID  0.0 - 2.5    PROTIME-INR     Status: Abnormal   Collection Time   06/04/11  3:08 AM      Component Value Range Comment   Prothrombin Time 18.5 (*) 11.6 - 15.2 (seconds)    INR 1.51 (*) 0.00 - 1.49    CBC  Status: Abnormal   Collection Time   06/04/11  3:08 AM      Component Value Range Comment   WBC 6.5  4.0 - 10.5 (K/uL)    RBC 3.73 (*) 3.87 - 5.11 (MIL/uL)    Hemoglobin 11.0 (*) 12.0 - 15.0 (g/dL)    HCT 14.7 (*) 82.9 - 46.0 (%)    MCV 91.4  78.0 - 100.0 (fL)    MCH 29.5  26.0 - 34.0 (pg)    MCHC 32.3  30.0 - 36.0 (g/dL)    RDW 56.2 (*) 13.0 - 15.5 (%)    Platelets 161  150 - 400 (K/uL)   COMPREHENSIVE METABOLIC PANEL     Status: Abnormal   Collection Time   06/04/11  3:08 AM      Component Value Range Comment   Sodium 134 (*) 135 - 145 (mEq/L)    Potassium 3.7  3.5 - 5.1 (mEq/L)    Chloride 101  96 - 112 (mEq/L)    CO2 27  19 - 32 (mEq/L)    Glucose, Bld 142 (*) 70 - 99 (mg/dL)    BUN 17  6 - 23 (mg/dL)    Creatinine, Ser 8.65  0.50 - 1.10 (mg/dL)    Calcium 9.0  8.4 - 10.5 (mg/dL)    Total Protein 5.6 (*) 6.0 - 8.3 (g/dL)    Albumin 2.6 (*) 3.5 - 5.2  (g/dL)    AST 19  0 - 37 (U/L)    ALT 20  0 - 35 (U/L)    Alkaline Phosphatase 81  39 - 117 (U/L)    Total Bilirubin 0.5  0.3 - 1.2 (mg/dL)    GFR calc non Af Amer 66 (*) >90 (mL/min)    GFR calc Af Amer 76 (*) >90 (mL/min)     Micro: Recent Results (from the past 240 hour(s))  MRSA PCR SCREENING     Status: Normal   Collection Time   06/03/11  3:55 PM      Component Value Range Status Comment   MRSA by PCR NEGATIVE  NEGATIVE  Final     Studies/Results: Dg Chest Portable 1 View  06/03/2011  *RADIOLOGY REPORT*  Clinical Data: Shortness of breath.  Congestive heart failure. Coronary artery disease.  PORTABLE CHEST - 1 VIEW  Comparison: 06/01/2011  Findings: Cardiomegaly stable.  Increased interstitial infiltrates and central perihilar air space disease is seen, consistent with increased pulmonary edema.  Increased atelectasis seen in the left retrocardiac lung base.  Dual lead transvenous pacemaker remains in appropriate position.  IMPRESSION:  1.  Increased pulmonary edema and left retrocardiac atelectasis. 2.  Stable cardiomegaly.  Original Report Authenticated By: Danae Orleans, M.D.    Medications:    . albuterol  2.5 mg Nebulization Q4H  . albuterol  5 mg Nebulization Once  . amiodarone  200 mg Oral Daily  . antiseptic oral rinse   Mouth Rinse Daily  . bimatoprost  1 drop Both Eyes QHS  . brimonidine  1 drop Both Eyes BID  . calcium-vitamin D  1 tablet Oral BID  . docusate sodium  100 mg Oral BID  . dorzolamide-timolol  1 drop Both Eyes BID  . fluconazole  100 mg Oral Daily  . furosemide  40 mg Intravenous Once  . furosemide  40 mg Intravenous BID  . gabapentin  300 mg Oral TID  . ipratropium  0.5 mg Nebulization Once  . ipratropium  0.5 mg Nebulization Q4H  . levothyroxine  100  mcg Oral Daily  . lidocaine  1 patch Transdermal Q24H  . methylPREDNISolone sodium succinate  40 mg Intravenous Q12H  . metoprolol  50 mg Oral Daily  . nystatin  500,000 Units Oral QID  .  polyethylene glycol  17 g Oral Daily  . potassium chloride  20 mEq Oral BID  . predniSONE  10 mg Oral Daily  . saccharomyces boulardii  250 mg Oral Daily  . simvastatin  10 mg Oral q1800  . sodium chloride  3 mL Intravenous Q12H  . sodium chloride      . warfarin  4 mg Oral ONCE-1800  . DISCONTD: albuterol  2.5 mg Nebulization Once  . DISCONTD: albuterol  5 mg Nebulization Q6H  . DISCONTD: furosemide  40 mg Intravenous Daily  . DISCONTD: ipratropium  0.5 mg Nebulization Q6H  . DISCONTD: sodium chloride  10 mL Intravenous Once  . DISCONTD: warfarin  2 mg Oral q1800   Assessment:Plan Principal Problem:  *CHF exacerbation: Continue diuresis with IV Lasix. Will add IV Solu-Medrol as the patient is actively wheezing. We'll check a d-dimer stat as the patient's INR has been subtherapeutic. If the d-dimer is elevated, the patient will have a CT Angio of chest  to rule out a pulmonary embolism. Active Problems:  Chronic pain syndrome  ARTHRITIS, LUMBOSACRAL SPINE  Sciatica  PAF (paroxysmal atrial fibrillation): Head it is controlled on metoprolol. She has been ruled out for an acute coronary syndrome.  Sick sinus syndrome  HTN (hypertension)  Hypothyroidism  Osteoporosis  Closed left ankle fracture      Maegen Wigle 06/04/2011, 7:37 AM

## 2011-06-04 NOTE — Progress Notes (Signed)
ANTICOAGULATION CONSULT NOTE - Consult  Pharmacy Consult for Warfarin/Lovenox Indication: atrial fibrillation/VTE Prophylaxis  No Known Allergies  Patient Measurements: Height: 5\' 5"  (165.1 cm) Weight: 170 lb 3.1 oz (77.2 kg) IBW/kg (Calculated) : 57   Vital Signs: Temp: 97.8 F (36.6 C) (10/01 0402) Temp src: Oral (10/01 0402) BP: 144/73 mmHg (10/01 0700) Pulse Rate: 65  (10/01 0700)  Labs:  Basename 06/04/11 0308 06/04/11 0307 06/03/11 1437 06/03/11 1015  HGB 11.0* -- -- 12.5  HCT 34.1* -- -- 38.4  PLT 161 -- -- 180  APTT -- -- -- --  LABPROT 18.5* -- 18.0* --  INR 1.51* -- 1.46 --  HEPARINUNFRC -- -- -- --  CREATININE 0.81 -- -- 0.94  CRCLEARANCE -- -- -- --  CKTOTAL -- 47 37 36  CKMB -- 1.5 1.3 1.3  TROPONINI -- <0.30 <0.30 <0.30   Estimated Creatinine Clearance: 55 ml/min (by C-G formula based on Cr of 0.81).  Medical History: Past Medical History  Diagnosis Date  . Hypertension   . High cholesterol   . Arthritis   . Hypothyroidism   . Coronary artery disease   . Arrhythmia     pacemaker  . Glaucoma    Assessment: Okay for Protocol Baseline INR Pending  Goal of Therapy:  INR 2-3   Plan:  Repeat Warfarin 4mg  PO x 1 today. Daily PT/INR. Lovenox 40mg  sq daily for VTE prophylaxis (stop when INR > 2.0 per MD order).  Lamonte Richer R 06/04/2011,7:54 AM

## 2011-06-05 ENCOUNTER — Telehealth: Payer: Self-pay | Admitting: Orthopedic Surgery

## 2011-06-05 LAB — PROTIME-INR
INR: 1.84 — ABNORMAL HIGH (ref 0.00–1.49)
Prothrombin Time: 21.6 seconds — ABNORMAL HIGH (ref 11.6–15.2)

## 2011-06-05 LAB — CBC
Hemoglobin: 12.3 g/dL (ref 12.0–15.0)
MCH: 29.2 pg (ref 26.0–34.0)
Platelets: 163 10*3/uL (ref 150–400)
RBC: 4.21 MIL/uL (ref 3.87–5.11)
WBC: 5.1 10*3/uL (ref 4.0–10.5)

## 2011-06-05 LAB — BASIC METABOLIC PANEL
CO2: 25 mEq/L (ref 19–32)
Calcium: 9.6 mg/dL (ref 8.4–10.5)
GFR calc non Af Amer: 79 mL/min — ABNORMAL LOW (ref >90)
Potassium: 3.6 mEq/L (ref 3.5–5.1)
Sodium: 136 mEq/L (ref 135–145)

## 2011-06-05 MED ORDER — WARFARIN SODIUM 2 MG PO TABS
4.0000 mg | ORAL_TABLET | Freq: Once | ORAL | Status: AC
  Administered 2011-06-05: 4 mg via ORAL
  Filled 2011-06-05: qty 2

## 2011-06-05 MED ORDER — VANCOMYCIN HCL 500 MG IV SOLR
500.0000 mg | Freq: Two times a day (BID) | INTRAVENOUS | Status: DC
  Administered 2011-06-05 (×2): 500 mg via INTRAVENOUS
  Filled 2011-06-05 (×5): qty 500

## 2011-06-05 MED ORDER — PIPERACILLIN-TAZOBACTAM 3.375 G IVPB
3.3750 g | Freq: Three times a day (TID) | INTRAVENOUS | Status: DC
  Administered 2011-06-05 – 2011-06-08 (×9): 3.375 g via INTRAVENOUS
  Filled 2011-06-05 (×19): qty 50

## 2011-06-05 MED ORDER — FUROSEMIDE 10 MG/ML IJ SOLN
60.0000 mg | Freq: Two times a day (BID) | INTRAMUSCULAR | Status: DC
  Administered 2011-06-05 – 2011-06-06 (×3): 60 mg via INTRAVENOUS
  Filled 2011-06-05 (×2): qty 6

## 2011-06-05 MED ORDER — FUROSEMIDE 10 MG/ML IJ SOLN
60.0000 mg | Freq: Two times a day (BID) | INTRAMUSCULAR | Status: DC
  Filled 2011-06-05: qty 6

## 2011-06-05 MED ORDER — PIPERACILLIN-TAZOBACTAM 3.375 G IVPB: 3.3750 g | Freq: Four times a day (QID) | INTRAVENOUS | Status: DC

## 2011-06-05 NOTE — Telephone Encounter (Signed)
Patient husband called from [facility], states patient had been admitted "after taking a turn for the worse."  States she has a blood clot in lung.  Patient also spoke on phone, requests cancelling her upcoming appointment for 06/12/11, follow up left ankle fracture.  States does not know when can re-schedule.  York Spaniel will be asking her doctors about having her ankle checked while in hospital.    Home ph# is [phone].

## 2011-06-05 NOTE — Progress Notes (Signed)
ANTICOAGULATION CONSULT NOTE -Consult  Pharmacy Consult for Lovenox/ Warfarin  Indication: pulmonary embolus (new)/ Afib (home therapy)  No Known Allergies  Patient Measurements: Height: 5\' 5"  (165.1 cm) Weight: 159 lb 6.3 oz (72.3 kg) IBW/kg (Calculated) : 57    Vital Signs: Temp: 97.1 F (36.2 C) (10/02 0400) Temp src: Oral (10/02 0000) BP: 122/76 mmHg (10/02 0600) Pulse Rate: 65  (10/02 0600)  Labs:  Basename 06/05/11 0252 06/04/11 0747 06/04/11 0308 06/04/11 0307 06/03/11 1437 06/03/11 1015  HGB 12.3 -- 11.0* -- -- --  HCT 37.9 -- 34.1* -- -- 38.4  PLT 163 -- 161 -- -- 180  APTT -- -- -- -- -- --  LABPROT 21.6* -- 18.5* -- 18.0* --  INR 1.84* -- 1.51* -- 1.46 --  HEPARINUNFRC -- -- -- -- -- --  CREATININE 0.70 -- 0.81 -- -- 0.94  CRCLEARANCE -- -- -- -- -- --  CKTOTAL -- 49 -- 47 37 --  CKMB -- 1.3 -- 1.5 1.3 --  TROPONINI -- <0.30 -- <0.30 <0.30 --    Medical History: Past Medical History  Diagnosis Date  . Hypertension   . High cholesterol   . Arthritis   . Hypothyroidism   . Coronary artery disease   . Arrhythmia     pacemaker  . Glaucoma     Medications:  Scheduled:     . albuterol  2.5 mg Nebulization Q4H  . amiodarone  200 mg Oral Daily  . antiseptic oral rinse   Mouth Rinse Daily  . bimatoprost  1 drop Both Eyes QHS  . brimonidine  1 drop Both Eyes BID  . calcium-vitamin D  1 tablet Oral BID  . docusate sodium  100 mg Oral BID  . dorzolamide-timolol  1 drop Both Eyes BID  . enoxaparin (LOVENOX) injection  1 mg/kg Subcutaneous Q12H  . fluconazole  100 mg Oral Daily  . furosemide  40 mg Intravenous BID  . gabapentin  300 mg Oral TID  . ipratropium  0.5 mg Nebulization Q4H  . levothyroxine  100 mcg Oral Daily  . lidocaine  1 patch Transdermal Q24H  . methylPREDNISolone sodium succinate  40 mg Intravenous Q12H  . metoprolol  50 mg Oral Daily  . nystatin  500,000 Units Oral QID  . polyethylene glycol  17 g Oral Daily  . potassium chloride   20 mEq Oral BID  . predniSONE  10 mg Oral Daily  . saccharomyces boulardii  250 mg Oral Daily  . simvastatin  10 mg Oral q1800  . sodium chloride  3 mL Intravenous Q12H  . sodium chloride      . warfarin  4 mg Oral ONCE-1800  . DISCONTD: enoxaparin (LOVENOX) injection  40 mg Subcutaneous Daily    Assessment:  Full dose Enoxaparin for new P.E. Warfarin for continuation of home therapy for Afib, plus new PE. CBC stable. INR moving towards goal.  Goal of Therapy:   Continue until INR 2.0 or greater.   Plan: Warfarin 4 mg today. Lovenox 1mg /kg every 12 hours. Monitor renal function and adjust accordingly.   Gilman Buttner, [place] J 06/05/2011,8:19 AM

## 2011-06-05 NOTE — Progress Notes (Signed)
ANTIBIOTIC CONSULT NOTE - INITIAL  Pharmacy Consult for  Vancomycin Indication: rule out pneumonia  No Known Allergies  Patient Measurements: Height: 5\' 5"  (165.1 cm) Weight: 159 lb 6.3 oz (72.3 kg) IBW/kg (Calculated) : 57  Adjusted Body Weight:  65kg  Vital Signs: Temp: 97.1 F (36.2 C) (10/02 0400) Temp src: Oral (10/02 0000) BP: 122/76 mmHg (10/02 0600) Pulse Rate: 65  (10/02 0600) Intake/Output from previous day: 10/01 0701 - 10/02 0700 In: -  Out: 3800 [Urine:3800] Intake/Output from this shift:    Labs:  Basename 06/05/11 0252 06/04/11 0308 06/03/11 1015  WBC 5.1 6.5 9.9  HGB 12.3 11.0* 12.5  PLT 163 161 180  LABCREA -- -- --  CREATININE 0.70 0.81 0.94  CRCLEARANCE -- -- --     Microbiology: Recent Results (from the past 720 hour(s))  URINE CULTURE     Status: Normal   Collection Time   05/14/11  3:08 PM      Component Value Range Status Comment   Specimen Description URINE, CLEAN CATCH   Final    Special Requests NONE   Final    Setup Time [id number]   Final    Colony Count NO GROWTH   Final    Culture NO GROWTH   Final    Report Status 05/15/2011 FINAL   Final   MRSA PCR SCREENING     Status: Normal   Collection Time   06/03/11  3:55 PM      Component Value Range Status Comment   MRSA by PCR NEGATIVE  NEGATIVE  Final     Medical History: Past Medical History  Diagnosis Date  . Hypertension   . High cholesterol   . Arthritis   . Hypothyroidism   . Coronary artery disease   . Arrhythmia     pacemaker  . Glaucoma     Medications:    Reviewed Assessment:  Empiric therapy for pneumonia. Crcl estimated at 54 mls per minute  Goal of Therapy:  Vancomycin trough level 15-20 mcg/ml  Plan:  Vancomycin 500mg  IV q12hrs. Check trough on 06/06/11 at 9am Follow up culture results  Caryl Asp 06/05/2011,9:32 AM

## 2011-06-05 NOTE — Progress Notes (Signed)
Subjective: She states that she feels a little better today denies any chest pain but still has some shortness of breath.  Objective: Vital signs in last 24 hours: Filed Vitals:   06/05/11 0420 06/05/11 0500 06/05/11 0600 06/05/11 0714  BP:   122/76   Pulse:  65 65   Temp:      TempSrc:      Resp:  16 14   Height:      Weight:   72.3 kg (159 lb 6.3 oz)   SpO2: 97% 96% 98% 98%    Intake/Output Summary (Last 24 hours) at 06/05/11 0843 Last data filed at 06/05/11 0600  Gross per 24 hour  Intake      0 ml  Output   3800 ml  Net  -3800 ml    Weight change: -9.348 kg (-20 lb 9.7 oz)  General: Alert, awake, oriented x3, in no acute distress. HEENT: No bruits, no goiter. Heart: Regular rate and rhythm, without murmurs, rubs, gallops. Lungs: Bilateral wheezing and inspiratory crackles at the bases  Abdomen: Soft, nontender, nondistended, positive bowel sounds. Extremities: No clubbing cyanosis or edema with positive pedal pulses. Neuro: Grossly intact, nonfocal.    Lab Results: Results for orders placed during the hospital encounter of 06/03/11 (from the past 24 hour(s))  BLOOD GAS, ARTERIAL     Status: Abnormal   Collection Time   06/04/11  8:50 AM      Component Value Range   O2 Content 2.0     pH, Arterial 7.491 (*) 7.350 - 7.400    pCO2 arterial 29.5 (*) 35.0 - 45.0 (mmHg)   pO2, Arterial 62.3 (*) 80.0 - 100.0 (mmHg)   Bicarbonate 22.3  20.0 - 24.0 (mEq/L)   TCO2 19.9  0 - 100 (mmol/L)   Acid-base deficit 0.7  0.0 - 2.0 (mmol/L)   O2 Saturation 93.9     Patient temperature 37.0     Collection site RIGHT RADIAL     Drawn by COLLECTED BY RT     Sample type ARTERIAL     Allens test (pass/fail) PASS  PASS   PRO B NATRIURETIC PEPTIDE     Status: Abnormal   Collection Time   06/05/11  2:52 AM      Component Value Range   BNP, POC 4527.0 (*) 0 - 450 (pg/mL)  PROTIME-INR     Status: Abnormal   Collection Time   06/05/11  2:52 AM      Component Value Range   Prothrombin Time 21.6 (*) 11.6 - 15.2 (seconds)   INR 1.84 (*) 0.00 - 1.49   CBC     Status: Abnormal   Collection Time   06/05/11  2:52 AM      Component Value Range   WBC 5.1  4.0 - 10.5 (K/uL)   RBC 4.21  3.87 - 5.11 (MIL/uL)   Hemoglobin 12.3  12.0 - 15.0 (g/dL)   HCT 16.1  09.6 - 04.5 (%)   MCV 90.0  78.0 - 100.0 (fL)   MCH 29.2  26.0 - 34.0 (pg)   MCHC 32.5  30.0 - 36.0 (g/dL)   RDW 40.9 (*) 81.1 - 15.5 (%)   Platelets 163  150 - 400 (K/uL)  BASIC METABOLIC PANEL     Status: Abnormal   Collection Time   06/05/11  2:52 AM      Component Value Range   Sodium 136  135 - 145 (mEq/L)   Potassium 3.6  3.5 - 5.1 (mEq/L)  Chloride 100  96 - 112 (mEq/L)   CO2 25  19 - 32 (mEq/L)   Glucose, Bld 182 (*) 70 - 99 (mg/dL)   BUN 18  6 - 23 (mg/dL)   Creatinine, Ser 1.61  0.50 - 1.10 (mg/dL)   Calcium 9.6  8.4 - 09.6 (mg/dL)   GFR calc non Af Amer 79 (*) >90 (mL/min)   GFR calc Af Amer >90  >90 (mL/min)    Micro: Recent Results (from the past 240 hour(s))  MRSA PCR SCREENING     Status: Normal   Collection Time   06/03/11  3:55 PM      Component Value Range Status Comment   MRSA by PCR NEGATIVE  NEGATIVE  Final     Studies/Results: Dg Chest 1 View  06/04/2011  *RADIOLOGY REPORT*  C  IMPRESSION: Question of a persistent pulmonary edema. Persistent opacity in the left lower lobe may represent superimposed atelectasis versus consolidation, little changed. Enlargement of cardiac silhouette post pacemaker.  Original Report Authenticated By: Lollie Marrow, M.D.   Ct Angio Chest W/cm &/or Wo Cm  06/04/2011  *RADIOLOGY REPORT*    IMPRESSION: Small right upper lobe acute pulmonary embolus.  Cardiomegaly with scattered patchy alveolar airspace disease versus edema.  Bibasilar atelectasis, worse in the left lower lobe.  Original Report Authenticated By: Judie Petit. Ruel Favors, M.D.   Dg Chest Portable 1 View  06/03/2011  *RADIOLOGY REPORT*    IMPRESSION:  1.  Increased pulmonary edema and left  retrocardiac atelectasis. 2.  Stable cardiomegaly.  Original Report Authenticated By: Danae Orleans, M.D.    Medications:     . albuterol  2.5 mg Nebulization Q4H  . amiodarone  200 mg Oral Daily  . antiseptic oral rinse   Mouth Rinse Daily  . bimatoprost  1 drop Both Eyes QHS  . brimonidine  1 drop Both Eyes BID  . calcium-vitamin D  1 tablet Oral BID  . docusate sodium  100 mg Oral BID  . dorzolamide-timolol  1 drop Both Eyes BID  . enoxaparin (LOVENOX) injection  1 mg/kg Subcutaneous Q12H  . fluconazole  100 mg Oral Daily  . furosemide  60 mg Intravenous BID  . gabapentin  300 mg Oral TID  . ipratropium  0.5 mg Nebulization Q4H  . levothyroxine  100 mcg Oral Daily  . lidocaine  1 patch Transdermal Q24H  . methylPREDNISolone sodium succinate  40 mg Intravenous Q12H  . metoprolol  50 mg Oral Daily  . nystatin  500,000 Units Oral QID  . piperacillin-tazobactam (ZOSYN)  IV  3.375 g Intravenous Q6H  . polyethylene glycol  17 g Oral Daily  . potassium chloride  20 mEq Oral BID  . predniSONE  10 mg Oral Daily  . saccharomyces boulardii  250 mg Oral Daily  . simvastatin  10 mg Oral q1800  . sodium chloride  3 mL Intravenous Q12H  . sodium chloride      . warfarin  4 mg Oral ONCE-1800  . warfarin  4 mg Oral ONCE-1800  . DISCONTD: enoxaparin (LOVENOX) injection  40 mg Subcutaneous Daily  . DISCONTD: furosemide  40 mg Intravenous BID     Principal Problem:  1. CHF exacerbation: Patient shortness of breath is most likely consistent with diastolic heart failure she also has a history of paroxysmal atrial fibrillation however she is in normal sinus rhythm currently. A 2-D echo results are still pending at this time .the patient CT angiogram from the small pulmonary embolism  therefore we will continue her on therapeutic dose of Lovenox and continue her on Coumadin. The patient does appear to be volume overloaded clinically and therefore we will continue her on IV Lasix  2. Chronic pain  syndrome secondary to ARTHRITIS, LUMBOSACRAL SPINE, Sciatica  3 . PAF (paroxysmal atrial fibrillation) patient will continue with the amiodarone and Coumadin per pharmacy protocol.  4. Sick sinus syndrome: Patient has a pacemaker for sick sinus syndrome and we will continue to monitor her on telemetry  5. HTN (hypertension): This appears to be fairly well controlled. We will hold off on an ACE inhibitor for now  6. Hypothyroidism: We'll check a TSH  7. Closed left ankle fracture: Patient is currently in a left ankle splint we will continue with weightbearing as tolerated.  #8 CODE STATUS patient is a DNR/DNI     LOS: 2 days   [facility] 06/05/2011, 8:43 AM

## 2011-06-06 LAB — BASIC METABOLIC PANEL
Chloride: 101 mEq/L (ref 96–112)
GFR calc Af Amer: 75 mL/min — ABNORMAL LOW (ref >90)
GFR calc non Af Amer: 65 mL/min — ABNORMAL LOW (ref >90)
Potassium: 3.7 mEq/L (ref 3.5–5.1)
Sodium: 138 mEq/L (ref 135–145)

## 2011-06-06 LAB — CBC
Hemoglobin: 11.4 g/dL — ABNORMAL LOW (ref 12.0–15.0)
MCHC: 32.2 g/dL (ref 30.0–36.0)
RDW: 15.6 % — ABNORMAL HIGH (ref 11.5–15.5)
WBC: 12.9 10*3/uL — ABNORMAL HIGH (ref 4.0–10.5)

## 2011-06-06 LAB — PROTIME-INR
INR: 4.13 — ABNORMAL HIGH (ref 0.00–1.49)
Prothrombin Time: 40.6 seconds — ABNORMAL HIGH (ref 11.6–15.2)

## 2011-06-06 LAB — VANCOMYCIN, TROUGH: Vancomycin Tr: 9.1 ug/mL — ABNORMAL LOW (ref 10.0–20.0)

## 2011-06-06 MED ORDER — VANCOMYCIN HCL 1000 MG IV SOLR
1250.0000 mg | INTRAVENOUS | Status: DC
  Administered 2011-06-06 – 2011-06-07 (×2): 1250 mg via INTRAVENOUS
  Filled 2011-06-06 (×5): qty 1250

## 2011-06-06 MED ORDER — ENOXAPARIN SODIUM 30 MG/0.3ML ~~LOC~~ SOLN
30.0000 mg | SUBCUTANEOUS | Status: AC
  Administered 2011-06-06: 30 mg via SUBCUTANEOUS
  Filled 2011-06-06: qty 0.3

## 2011-06-06 MED ORDER — FUROSEMIDE 10 MG/ML IJ SOLN
80.0000 mg | Freq: Two times a day (BID) | INTRAMUSCULAR | Status: DC
  Administered 2011-06-06 – 2011-06-07 (×2): 80 mg via INTRAVENOUS
  Filled 2011-06-06 (×2): qty 8

## 2011-06-06 MED ORDER — PREDNISONE 20 MG PO TABS
40.0000 mg | ORAL_TABLET | Freq: Every day | ORAL | Status: DC
  Administered 2011-06-07 – 2011-06-08 (×2): 40 mg via ORAL
  Filled 2011-06-06 (×2): qty 2

## 2011-06-06 MED ORDER — ENOXAPARIN SODIUM 120 MG/0.8ML ~~LOC~~ SOLN
1.5000 mg/kg | SUBCUTANEOUS | Status: DC
  Filled 2011-06-06 (×2): qty 0.8

## 2011-06-06 NOTE — Progress Notes (Signed)
ANTICOAGULATION CONSULT NOTE  Pharmacy Consult for Lovenox/ Warfarin  Indication: pulmonary embolus (new)/ Afib (home therapy)  No Known Allergies  Patient Measurements: Height: 5\' 5"  (165.1 cm) Weight: 163 lb 9.3 oz (74.2 kg) IBW/kg (Calculated) : 57   Vital Signs: Temp: 97.4 F (36.3 C) (10/03 0634) Temp src: Oral (10/03 0634) BP: 126/68 mmHg (10/03 0634) Pulse Rate: 65  (10/03 0634)  Labs:  Basename 06/06/11 0541 06/05/11 0252 06/04/11 0747 06/04/11 0308 06/04/11 0307 06/03/11 1437  HGB 11.4* 12.3 -- -- -- --  HCT 35.4* 37.9 -- 34.1* -- --  PLT 214 163 -- 161 -- --  APTT -- -- -- -- -- --  LABPROT 40.6* 21.6* -- 18.5* -- --  INR 4.13* 1.84* -- 1.51* -- --  HEPARINUNFRC -- -- -- -- -- --  CREATININE 0.82 0.70 -- 0.81 -- --  CRCLEARANCE -- -- -- -- -- --  CKTOTAL -- -- 49 -- 47 37  CKMB -- -- 1.3 -- 1.5 1.3  TROPONINI -- -- <0.30 -- <0.30 <0.30   Medical History: Past Medical History  Diagnosis Date  . Hypertension   . High cholesterol   . Arthritis   . Hypothyroidism   . Coronary artery disease   . Arrhythmia     pacemaker  . Glaucoma    Medications:  Scheduled:     . albuterol  2.5 mg Nebulization Q4H  . amiodarone  200 mg Oral Daily  . antiseptic oral rinse   Mouth Rinse Daily  . bimatoprost  1 drop Both Eyes QHS  . brimonidine  1 drop Both Eyes BID  . calcium-vitamin D  1 tablet Oral BID  . docusate sodium  100 mg Oral BID  . dorzolamide-timolol  1 drop Both Eyes BID  . enoxaparin (LOVENOX) injection  1 mg/kg Subcutaneous Q12H  . fluconazole  100 mg Oral Daily  . furosemide  60 mg Intravenous BID  . gabapentin  300 mg Oral TID  . ipratropium  0.5 mg Nebulization Q4H  . levothyroxine  100 mcg Oral Daily  . lidocaine  1 patch Transdermal Q24H  . methylPREDNISolone sodium succinate  40 mg Intravenous Q12H  . metoprolol  50 mg Oral Daily  . nystatin  500,000 Units Oral QID  . piperacillin-tazobactam (ZOSYN)  IV  3.375 g Intravenous Q8H  .  polyethylene glycol  17 g Oral Daily  . potassium chloride  20 mEq Oral BID  . predniSONE  10 mg Oral Daily  . saccharomyces boulardii  250 mg Oral Daily  . simvastatin  10 mg Oral q1800  . sodium chloride  3 mL Intravenous Q12H  . vancomycin  500 mg Intravenous Q12H  . warfarin  4 mg Oral ONCE-1800   Assessment:  Full dose Enoxaparin for new P.E. Warfarin for continuation of home therapy for Afib, plus new PE. CBC stable. INR supra-therapeutic Plan 5 days of Lovenox over-lap  Goal of Therapy: Maintain INR 2-3 5 days of Lovenox over-lap   Plan: NO coumadin today Continue Lovenox Monitor for any s/s bleeding Lovenox 1.5mg /kg sq q24hrs Monitor CBC Monitor renal function and adjust accordingly.   Margo Aye, Jaren Vanetten A 06/06/2011,11:23 AM

## 2011-06-06 NOTE — Progress Notes (Signed)
ANTIBIOTIC CONSULT NOTE   Pharmacy Consult for  Vancomycin Indication: rule out pneumonia  No Known Allergies  Patient Measurements: Height: 5\' 5"  (165.1 cm) Weight: 163 lb 9.3 oz (74.2 kg) IBW/kg (Calculated) : 57  Adjusted Body Weight:  65kg  Vital Signs: Temp: 97.4 F (36.3 C) (10/03 0634) Temp src: Oral (10/03 0634) BP: 126/68 mmHg (10/03 0634) Pulse Rate: 65  (10/03 0634) Intake/Output from previous day: 10/02 0701 - 10/03 0700 In: 350 [P.O.:200; IV Piggyback:150] Out: 1225 [Urine:1225] Intake/Output from this shift:    Labs: RECENT VANCOMYCIN TROUGH LEVELS: Recent Labs  Basename 06/06/11 0909   VANCOTROUGH 9.1*     Basename 06/06/11 0541 06/05/11 0252 06/04/11 0308  WBC 12.9* 5.1 6.5  HGB 11.4* 12.3 11.0*  PLT 214 163 161  LABCREA -- -- --  CREATININE 0.82 0.70 0.81  CRCLEARANCE -- -- --   Microbiology: Recent Results (from the past 720 hour(s))  URINE CULTURE     Status: Normal   Collection Time   05/14/11  3:08 PM      Component Value Range Status Comment   Specimen Description URINE, CLEAN CATCH   Final    Special Requests NONE   Final    Setup Time [id number]   Final    Colony Count NO GROWTH   Final    Culture NO GROWTH   Final    Report Status 05/15/2011 FINAL   Final   MRSA PCR SCREENING     Status: Normal   Collection Time   06/03/11  3:55 PM      Component Value Range Status Comment   MRSA by PCR NEGATIVE  NEGATIVE  Final     Medical History: Past Medical History  Diagnosis Date  . Hypertension   . High cholesterol   . Arthritis   . Hypothyroidism   . Coronary artery disease   . Arrhythmia     pacemaker  . Glaucoma     Medications:    Reviewed Assessment: Renal fxn stable Trough below goal Crcl estimated at 54 mls per minute  Goal of Therapy:  Vancomycin trough level 15-20 mcg/ml  Plan: Change vancomycin to 1250mg  iv q24hrs (dose increase) Monitor renal fxn F/u trough at steady state if needed Follow up culture  results  Valrie Hart A 06/06/2011,11:34 AM

## 2011-06-06 NOTE — Progress Notes (Signed)
Subjective: This lady seems to have breathing difficulties still and is congested.           Physical Exam: Blood pressure 126/68, pulse 65, temperature 97.4 F (36.3 C), temperature source Oral, resp. rate 18, height 5\' 5"  (1.651 m), weight 74.2 kg (163 lb 9.3 oz), SpO2 97.00%. She has increased work of breathing. There is no peripheral central cyanosis. Lung fields show bilateral widespread crackles, some of this I believe may well be bronchitis but most of it is probably edema. Jugular venous pressure does not seem to be raised medically. Heart sounds are present and normal without gallop rhythm. She is alert and orientated. She looks very tired.   Investigations: Results for orders placed during the hospital encounter of 06/03/11 (from the past 48 hour(s))  PRO B NATRIURETIC PEPTIDE     Status: Abnormal   Collection Time   06/05/11  2:52 AM      Component Value Range Comment   BNP, POC 4527.0 (*) 0 - 450 (pg/mL)   PROTIME-INR     Status: Abnormal   Collection Time   06/05/11  2:52 AM      Component Value Range Comment   Prothrombin Time 21.6 (*) 11.6 - 15.2 (seconds)    INR 1.84 (*) 0.00 - 1.49    CBC     Status: Abnormal   Collection Time   06/05/11  2:52 AM      Component Value Range Comment   WBC 5.1  4.0 - 10.5 (K/uL)    RBC 4.21  3.87 - 5.11 (MIL/uL)    Hemoglobin 12.3  12.0 - 15.0 (g/dL)    HCT 30.8  65.7 - 84.6 (%)    MCV 90.0  78.0 - 100.0 (fL)    MCH 29.2  26.0 - 34.0 (pg)    MCHC 32.5  30.0 - 36.0 (g/dL)    RDW 96.2 (*) 95.2 - 15.5 (%)    Platelets 163  150 - 400 (K/uL)   BASIC METABOLIC PANEL     Status: Abnormal   Collection Time   06/05/11  2:52 AM      Component Value Range Comment   Sodium 136  135 - 145 (mEq/L)    Potassium 3.6  3.5 - 5.1 (mEq/L)    Chloride 100  96 - 112 (mEq/L)    CO2 25  19 - 32 (mEq/L)    Glucose, Bld 182 (*) 70 - 99 (mg/dL)    BUN 18  6 - 23 (mg/dL)    Creatinine, Ser 8.41  0.50 - 1.10 (mg/dL)    Calcium 9.6  8.4 - 10.5  (mg/dL)    GFR calc non Af Amer 79 (*) >90 (mL/min)    GFR calc Af Amer >90  >90 (mL/min)   PROTIME-INR     Status: Abnormal   Collection Time   06/06/11  5:41 AM      Component Value Range Comment   Prothrombin Time 40.6 (*) 11.6 - 15.2 (seconds)    INR 4.13 (*) 0.00 - 1.49    CBC     Status: Abnormal   Collection Time   06/06/11  5:41 AM      Component Value Range Comment   WBC 12.9 (*) 4.0 - 10.5 (K/uL)    RBC 3.89  3.87 - 5.11 (MIL/uL)    Hemoglobin 11.4 (*) 12.0 - 15.0 (g/dL)    HCT 32.4 (*) 40.1 - 46.0 (%)    MCV 91.0  78.0 - 100.0 (fL)  MCH 29.3  26.0 - 34.0 (pg)    MCHC 32.2  30.0 - 36.0 (g/dL)    RDW 40.9 (*) 81.1 - 15.5 (%)    Platelets 214  150 - 400 (K/uL) DELTA CHECK NOTED  BASIC METABOLIC PANEL     Status: Abnormal   Collection Time   06/06/11  5:41 AM      Component Value Range Comment   Sodium 138  135 - 145 (mEq/L)    Potassium 3.7  3.5 - 5.1 (mEq/L)    Chloride 101  96 - 112 (mEq/L)    CO2 27  19 - 32 (mEq/L)    Glucose, Bld 170 (*) 70 - 99 (mg/dL)    BUN 23  6 - 23 (mg/dL)    Creatinine, Ser 9.14  0.50 - 1.10 (mg/dL)    Calcium 9.6  8.4 - 10.5 (mg/dL)    GFR calc non Af Amer 65 (*) >90 (mL/min)    GFR calc Af Amer 75 (*) >90 (mL/min)   PRO B NATRIURETIC PEPTIDE     Status: Abnormal   Collection Time   06/06/11  5:41 AM      Component Value Range Comment   BNP, POC 3426.0 (*) 0 - 450 (pg/mL)   VANCOMYCIN, TROUGH     Status: Abnormal   Collection Time   06/06/11  9:09 AM      Component Value Range Comment   Vancomycin Tr 9.1 (*) 10.0 - 20.0 (ug/mL)    Recent Results (from the past 240 hour(s))  MRSA PCR SCREENING     Status: Normal   Collection Time   06/03/11  3:55 PM      Component Value Range Status Comment   MRSA by PCR NEGATIVE  NEGATIVE  Final     Ct Angio Chest W/cm &/or Wo Cm  06/04/2011  *RADIOLOGY REPORT*  Clinical Data:   shortness of breath, weakness, ankle fracture, elevated D-dimer  CT ANGIOGRAPHY CHEST WITH CONTRAST  Technique:   Multidetector CT imaging of the chest was performed using the standard protocol during bolus administration of intravenous contrast.  Multiplanar CT image reconstructions including MIPs were obtained to evaluate the vascular anatomy.  Contrast:  100 ml Omnipaque 350  Comparison:  06/04/2011  Findings:  Hypodense elongated small filling defect within a proximal right upper lobe  segmental pulmonary artery, consistent with a small acute right upper lobe pulmonary embolus.  Best demonstrated on images 42-49.  No left-sided pulmonary embolus demonstrated.  Heart is enlarged.  No pericardial effusion.  Trace pleural effusions, slightly larger on the left.  No adenopathy.  Pacer leads evident.  No acute process in the imaged upper abdomen.  Lung windows demonstrate patchy scattered alveolar airspace disease versus edema.  Lingula and bibasilar atelectasis noted, most pronounced in the left lower lobe.  Trachea and central airways are patent.  No mucous plugging or significant bronchiectasis.  Degenerative changes of the spine with previous L1 vertebral augmentation.  Associated mild scoliosis.  Review of the MIP images confirms the above findings.  IMPRESSION: Small right upper lobe acute pulmonary embolus.  Cardiomegaly with scattered patchy alveolar airspace disease versus edema.  Bibasilar atelectasis, worse in the left lower lobe.  Original Report Authenticated By: Judie Petit. Ruel Favors, M.D.      Medications: I have reviewed the patient's current medications.  Impression: 1. Acute diastolic congestive heart failure. 2. Right upper lobe acute pulmonary embolism. 3. Paroxysmal atrial fibrillation. 4. Hypertension. 5. History of recent closed left ankle fracture.  6. History of recent sciatica. 7. Osteoporosis.     Plan: 1. Increase intravenous Lasix to 80 mg every 12 hours. 2. Switch steroids to oral prednisone. 3. Patient is not ready for discharge to skilled nursing facility.     LOS: 3 days    Prajwal Fellner C 06/06/2011, 1:30 PM

## 2011-06-06 NOTE — Telephone Encounter (Signed)
Dr. Romeo Apple is aware.

## 2011-06-07 ENCOUNTER — Inpatient Hospital Stay (HOSPITAL_COMMUNITY): Payer: Medicare Other

## 2011-06-07 LAB — CBC
MCH: 30.2 pg (ref 26.0–34.0)
MCHC: 33.2 g/dL (ref 30.0–36.0)
Platelets: 240 10*3/uL (ref 150–400)
RDW: 15.9 % — ABNORMAL HIGH (ref 11.5–15.5)

## 2011-06-07 LAB — BASIC METABOLIC PANEL
Calcium: 9.5 mg/dL (ref 8.4–10.5)
GFR calc Af Amer: 55 mL/min — ABNORMAL LOW (ref >90)
GFR calc non Af Amer: 48 mL/min — ABNORMAL LOW (ref >90)
Sodium: 139 mEq/L (ref 135–145)

## 2011-06-07 LAB — PROTIME-INR: Prothrombin Time: 51.9 seconds — ABNORMAL HIGH (ref 11.6–15.2)

## 2011-06-07 LAB — PRO B NATRIURETIC PEPTIDE: Pro B Natriuretic peptide (BNP): 3835 pg/mL — ABNORMAL HIGH (ref 0–450)

## 2011-06-07 MED ORDER — FUROSEMIDE 10 MG/ML IJ SOLN
80.0000 mg | Freq: Three times a day (TID) | INTRAMUSCULAR | Status: DC
  Administered 2011-06-07 (×3): 80 mg via INTRAVENOUS
  Filled 2011-06-07 (×4): qty 8

## 2011-06-07 NOTE — Progress Notes (Signed)
ANTICOAGULATION CONSULT NOTE  Pharmacy Consult for Lovenox/ Warfarin  Indication: pulmonary embolus (new)/ Afib (home therapy)  No Known Allergies  Patient Measurements: Height: 5\' 5"  (165.1 cm) Weight: 163 lb 2.3 oz (74 kg) IBW/kg (Calculated) : 57   Vital Signs: Temp: 97.4 F (36.3 C) (10/04 0611) Temp src: Oral (10/04 0611) BP: 137/82 mmHg (10/04 0800) Pulse Rate: 65  (10/04 0800)  Labs:  Basename 06/07/11 0404 06/06/11 0541 06/05/11 0252  HGB 11.8* 11.4* --  HCT 35.5* 35.4* 37.9  PLT 240 214 163  APTT -- -- --  LABPROT 51.9* 40.6* 21.6*  INR 5.65* 4.13* 1.84*  HEPARINUNFRC -- -- --  CREATININE 1.06 0.82 0.70  CRCLEARANCE -- -- --  CKTOTAL -- -- --  CKMB -- -- --  TROPONINI -- -- --   Medical History: Past Medical History  Diagnosis Date  . Hypertension   . High cholesterol   . Arthritis   . Hypothyroidism   . Coronary artery disease   . Arrhythmia     pacemaker  . Glaucoma    Medications:  Scheduled:     . albuterol  2.5 mg Nebulization Q4H  . amiodarone  200 mg Oral Daily  . antiseptic oral rinse   Mouth Rinse Daily  . bimatoprost  1 drop Both Eyes QHS  . brimonidine  1 drop Both Eyes BID  . calcium-vitamin D  1 tablet Oral BID  . docusate sodium  100 mg Oral BID  . dorzolamide-timolol  1 drop Both Eyes BID  . enoxaparin (LOVENOX) injection  30 mg Subcutaneous NOW  . fluconazole  100 mg Oral Daily  . furosemide  80 mg Intravenous TID  . gabapentin  300 mg Oral TID  . ipratropium  0.5 mg Nebulization Q4H  . levothyroxine  100 mcg Oral Daily  . lidocaine  1 patch Transdermal Q24H  . nystatin  500,000 Units Oral QID  . piperacillin-tazobactam (ZOSYN)  IV  3.375 g Intravenous Q8H  . polyethylene glycol  17 g Oral Daily  . potassium chloride  20 mEq Oral BID  . predniSONE  40 mg Oral Daily  . saccharomyces boulardii  250 mg Oral Daily  . simvastatin  10 mg Oral q1800  . sodium chloride  3 mL Intravenous Q12H  . vancomycin  1,250 mg Intravenous  Q24H  . DISCONTD: enoxaparin (LOVENOX) injection  1.5 mg/kg Subcutaneous Q24H  . DISCONTD: enoxaparin (LOVENOX) injection  1 mg/kg Subcutaneous Q12H  . DISCONTD: furosemide  60 mg Intravenous BID  . DISCONTD: furosemide  80 mg Intravenous BID  . DISCONTD: methylPREDNISolone sodium succinate  40 mg Intravenous Q12H  . DISCONTD: metoprolol  50 mg Oral Daily  . DISCONTD: predniSONE  10 mg Oral Daily  . DISCONTD: vancomycin  500 mg Intravenous Q12H   Assessment: Lovenox d/c'd per MD due to INR > 5 INR supra-therapeutic No reported bleeding Cbc stable  Goal of Therapy: Maintain INR 2-3   Plan: NO coumadin today INR daily until stable and therapeutic Monitor for any s/s bleeding Monitor CBC   [name] A 06/07/2011,10:56 AM

## 2011-06-07 NOTE — Progress Notes (Signed)
CRITICAL VALUE ALERT  Critical value received: PT 51.9 & INR 5.65 Date of notification: 06/07/2011 Time of notification: 0510 Critical value read back:yes  Nurse who received alert: A.Amaiyah Nordhoff,  MD notified (1st page): Dr. Orvan Falconer Time of first page: 0512 MD notified (2nd page):  Time of second page:  Responding MD: Dr. Orvan Falconer Time MD responded: [phone]

## 2011-06-07 NOTE — Progress Notes (Signed)
Critical value received, MD notified,  No new orders at this time, will continue to monitor.

## 2011-06-07 NOTE — Progress Notes (Signed)
  Left ankle fracture   Scheduled for f/u in 4 weeks in the office   Continue boot for now keep appt in 4 weeks

## 2011-06-07 NOTE — Consult Note (Signed)
Family had questions today regarding payment at [facility] when pt returns.  CSW spoke with [name] at [facility] who reports pt has 9 days left with full Medicare coverage.  CSW also informed pt and family of daily rate during co-insurance days.  Pt plans to return to [facility] when medically stable.    [name]

## 2011-06-07 NOTE — Progress Notes (Signed)
Subjective: This lady seems to have breathing difficulties still and is congested. This lady does not seem to be improving as rapidly as I had anticipated.           Physical Exam: Blood pressure 137/82, pulse 65, temperature 97.4 F (36.3 C), temperature source Oral, resp. rate 21, height 5\' 5"  (1.651 m), weight 74 kg (163 lb 2.3 oz), SpO2 94.00%. She has increased work of breathing. There is no peripheral central cyanosis. Lung fields show bilateral widespread crackles, some of this I believe may well be bronchitis but most of it is probably edema. There is also scattered wheezing. Jugular venous pressure does not seem to be raised medically. Heart sounds are present and normal without gallop rhythm. She is alert and orientated. She looks very tired.   Investigations: Results for orders placed during the hospital encounter of 06/03/11 (from the past 48 hour(s))  PROTIME-INR     Status: Abnormal   Collection Time   06/06/11  5:41 AM      Component Value Range Comment   Prothrombin Time 40.6 (*) 11.6 - 15.2 (seconds)    INR 4.13 (*) 0.00 - 1.49    CBC     Status: Abnormal   Collection Time   06/06/11  5:41 AM      Component Value Range Comment   WBC 12.9 (*) 4.0 - 10.5 (K/uL)    RBC 3.89  3.87 - 5.11 (MIL/uL)    Hemoglobin 11.4 (*) 12.0 - 15.0 (g/dL)    HCT 78.2 (*) 95.6 - 46.0 (%)    MCV 91.0  78.0 - 100.0 (fL)    MCH 29.3  26.0 - 34.0 (pg)    MCHC 32.2  30.0 - 36.0 (g/dL)    RDW 21.3 (*) 08.6 - 15.5 (%)    Platelets 214  150 - 400 (K/uL) DELTA CHECK NOTED  BASIC METABOLIC PANEL     Status: Abnormal   Collection Time   06/06/11  5:41 AM      Component Value Range Comment   Sodium 138  135 - 145 (mEq/L)    Potassium 3.7  3.5 - 5.1 (mEq/L)    Chloride 101  96 - 112 (mEq/L)    CO2 27  19 - 32 (mEq/L)    Glucose, Bld 170 (*) 70 - 99 (mg/dL)    BUN 23  6 - 23 (mg/dL)    Creatinine, Ser 5.78  0.50 - 1.10 (mg/dL)    Calcium 9.6  8.4 - 10.5 (mg/dL)    GFR calc non Af Amer 65  (*) >90 (mL/min)    GFR calc Af Amer 75 (*) >90 (mL/min)   PRO B NATRIURETIC PEPTIDE     Status: Abnormal   Collection Time   06/06/11  5:41 AM      Component Value Range Comment   BNP, POC 3426.0 (*) 0 - 450 (pg/mL)   VANCOMYCIN, TROUGH     Status: Abnormal   Collection Time   06/06/11  9:09 AM      Component Value Range Comment   Vancomycin Tr 9.1 (*) 10.0 - 20.0 (ug/mL)   PROTIME-INR     Status: Abnormal   Collection Time   06/07/11  4:04 AM      Component Value Range Comment   Prothrombin Time 51.9 (*) 11.6 - 15.2 (seconds) RESULT REPEATED AND VERIFIED   INR 5.65 (*) 0.00 - 1.49    CBC     Status: Abnormal   Collection Time  06/07/11  4:04 AM      Component Value Range Comment   WBC 11.5 (*) 4.0 - 10.5 (K/uL)    RBC 3.91  3.87 - 5.11 (MIL/uL)    Hemoglobin 11.8 (*) 12.0 - 15.0 (g/dL)    HCT 45.4 (*) 09.8 - 46.0 (%)    MCV 90.8  78.0 - 100.0 (fL)    MCH 30.2  26.0 - 34.0 (pg)    MCHC 33.2  30.0 - 36.0 (g/dL)    RDW 11.9 (*) 14.7 - 15.5 (%)    Platelets 240  150 - 400 (K/uL)   BASIC METABOLIC PANEL     Status: Abnormal   Collection Time   06/07/11  4:04 AM      Component Value Range Comment   Sodium 139  135 - 145 (mEq/L)    Potassium 3.7  3.5 - 5.1 (mEq/L)    Chloride 102  96 - 112 (mEq/L)    CO2 29  19 - 32 (mEq/L)    Glucose, Bld 161 (*) 70 - 99 (mg/dL)    BUN 28 (*) 6 - 23 (mg/dL)    Creatinine, Ser 8.29  0.50 - 1.10 (mg/dL)    Calcium 9.5  8.4 - 10.5 (mg/dL)    GFR calc non Af Amer 48 (*) >90 (mL/min)    GFR calc Af Amer 55 (*) >90 (mL/min)   PRO B NATRIURETIC PEPTIDE     Status: Abnormal   Collection Time   06/07/11  4:04 AM      Component Value Range Comment   BNP, POC 3835.0 (*) 0 - 450 (pg/mL)    Recent Results (from the past 240 hour(s))  MRSA PCR SCREENING     Status: Normal   Collection Time   06/03/11  3:55 PM      Component Value Range Status Comment   MRSA by PCR NEGATIVE  NEGATIVE  Final     Dg Chest Portable 1 View  06/07/2011  *RADIOLOGY  REPORT*  Clinical Data: CHF, follow up  PORTABLE CHEST - 1 VIEW  Comparison: Portable exam 0545 hours compared to 06/03/2001  Findings: Left subclavian sequential transvenous pacemaker leads project at right atrium and right ventricle. Enlargement of cardiac silhouette. Calcified tortuous thoracic aorta. Pulmonary vascular congestion. Minimal pulmonary edema. Persistent opacification in the left lower lobe question atelectasis versus consolidation. Minimal peribronchial thickening. No gross pleural effusion or pneumothorax. Bones demineralized.  IMPRESSION: Enlargement of cardiac silhouette with pulmonary vascular congestion and minimal pulmonary edema. Persistent left lower lobe opacification question atelectasis versus consolidation.  Original Report Authenticated By: Lollie Marrow, M.D.      Medications: I have reviewed the patient's current medications.  Impression: 1. Acute diastolic congestive heart failure, clinically still decompensated. 2. Right upper lobe acute pulmonary embolism. 3. Paroxysmal atrial fibrillation. 4. Hypertension. 5. History of recent closed left ankle fracture.  6. History of recent sciatica. 7. Osteoporosis.     Plan: 1. Increase intravenous Lasix to 80 mg every 8 hours. 2. Discontinue full dose Lovenox as INR is supratherapeutic.     LOS: 4 days   Mekhia Brogan C 06/07/2011, 10:52 AM

## 2011-06-07 NOTE — Progress Notes (Signed)
ANTIBIOTIC CONSULT NOTE   Pharmacy Consult for  Vancomycin Indication: rule out pneumonia  No Known Allergies  Patient Measurements: Height: 5\' 5"  (165.1 cm) Weight: 163 lb 2.3 oz (74 kg) IBW/kg (Calculated) : 57  Adjusted Body Weight:  65kg  Vital Signs: Temp: 97.4 F (36.3 C) (10/04 0611) Temp src: Oral (10/04 0611) BP: 137/82 mmHg (10/04 0800) Pulse Rate: 65  (10/04 0800) Intake/Output from previous day: 10/03 0701 - 10/04 0700 In: 680 [P.O.:680] Out: 1000 [Urine:1000] Intake/Output from this shift:    Labs: RECENT VANCOMYCIN TROUGH LEVELS: Recent Labs  Basename 06/06/11 0909   VANCOTROUGH 9.1*     Basename 06/07/11 0404 06/06/11 0541 06/05/11 0252  WBC 11.5* 12.9* 5.1  HGB 11.8* 11.4* 12.3  PLT 240 214 163  LABCREA -- -- --  CREATININE 1.06 0.82 0.70  CRCLEARANCE -- -- --   Microbiology: Recent Results (from the past 720 hour(s))  URINE CULTURE     Status: Normal   Collection Time   05/14/11  3:08 PM      Component Value Range Status Comment   Specimen Description URINE, CLEAN CATCH   Final    Special Requests NONE   Final    Setup Time [id number]   Final    Colony Count NO GROWTH   Final    Culture NO GROWTH   Final    Report Status 05/15/2011 FINAL   Final   MRSA PCR SCREENING     Status: Normal   Collection Time   06/03/11  3:55 PM      Component Value Range Status Comment   MRSA by PCR NEGATIVE  NEGATIVE  Final     Medical History: Past Medical History  Diagnosis Date  . Hypertension   . High cholesterol   . Arthritis   . Hypothyroidism   . Coronary artery disease   . Arrhythmia     pacemaker  . Glaucoma    Medications:    Reviewed  Assessment: Renal fxn stable Trough below goal Crcl estimated at 54 mls per minute  Goal of Therapy:  Vancomycin trough level 15-20 mcg/ml  Plan: Change vancomycin to 1250mg  iv q24hrs (dose increase) Monitor renal fxn F/u trough at steady state if needed Follow up culture results  Valrie Hart A 06/07/2011,11:02 AM

## 2011-06-08 ENCOUNTER — Inpatient Hospital Stay
Admission: RE | Admit: 2011-06-08 | Discharge: 2011-08-15 | Disposition: A | Discharge status: Home / Self Care | Payer: Medicare Other | Source: Skilled Nursing Facility | Attending: Internal Medicine | Admitting: Internal Medicine

## 2011-06-08 DIAGNOSIS — J189 Pneumonia, unspecified organism: Principal | ICD-10-CM

## 2011-06-08 DIAGNOSIS — R791 Abnormal coagulation profile: Secondary | ICD-10-CM

## 2011-06-08 DIAGNOSIS — T148XXA Other injury of unspecified body region, initial encounter: Secondary | ICD-10-CM

## 2011-06-08 LAB — BASIC METABOLIC PANEL
Chloride: 98 mEq/L (ref 96–112)
GFR calc Af Amer: 54 mL/min — ABNORMAL LOW (ref >90)
Potassium: 3.5 mEq/L (ref 3.5–5.1)

## 2011-06-08 LAB — CBC
Platelets: 265 10*3/uL (ref 150–400)
RDW: 15.6 % — ABNORMAL HIGH (ref 11.5–15.5)
WBC: 10.6 10*3/uL — ABNORMAL HIGH (ref 4.0–10.5)

## 2011-06-08 LAB — PRO B NATRIURETIC PEPTIDE: Pro B Natriuretic peptide (BNP): 3887 pg/mL — ABNORMAL HIGH (ref 0–450)

## 2011-06-08 LAB — PROTIME-INR: INR: 4.46 — ABNORMAL HIGH (ref 0.00–1.49)

## 2011-06-08 MED ORDER — PREDNISONE 20 MG PO TABS: ORAL_TABLET | ORAL | Status: DC

## 2011-06-08 MED ORDER — MOXIFLOXACIN HCL 400 MG PO TABS: 400.0000 mg | ORAL_TABLET | Freq: Every day | ORAL | Status: AC

## 2011-06-08 MED ORDER — FUROSEMIDE 80 MG PO TABS: 80.0000 mg | ORAL_TABLET | Freq: Two times a day (BID) | ORAL | Status: DC

## 2011-06-08 MED ORDER — POTASSIUM CHLORIDE CRYS ER 20 MEQ PO TBCR: 20.0000 meq | EXTENDED_RELEASE_TABLET | Freq: Two times a day (BID) | ORAL | Status: DC

## 2011-06-08 NOTE — Progress Notes (Signed)
Called report to lisa, lpn at [facility]. Verbalized understanding. Pt dc'd to facility via nursing staff.  Schonewitz, Candelaria Stagers 06/08/2011

## 2011-06-08 NOTE — Progress Notes (Signed)
Encounter addended by: [name], MD on: 06/08/2011  8:49 PM<BR>     Documentation filed: Notes Section

## 2011-06-08 NOTE — Consult Note (Signed)
Pt d/c today by MD back to [facility]. Pt, pt's husband, and facility aware and agreeable. Pt to transfer with RN.  No FL2 needed per facility.  Karn Cassis

## 2011-06-08 NOTE — Discharge Summary (Signed)
Physician Discharge Summary  Patient ID: [name] MRN: [id number] DOB/AGE: Nov 22, 1926 77 y.o. Primary Care Physician:FUSCO,LAWRENCE J., MD Admit date: 06/03/2011 Discharge date: 06/08/2011    Discharge Diagnoses:  1. Acute diastolic congestive heart failure, clinically improved. 2. Right upper lobe acute pulmonary embolism. 3. Paroxysmal atrial fibrillation. 4. Hypertension. 5. History of recent closed left ankle fracture. Followup with orthopedics in 4 weeks. 6. History of recent sciatica. 7. Osteoporosis.   Current Discharge Medication List    START taking these medications   Details  furosemide (LASIX) 80 MG tablet Take 1 tablet (80 mg total) by mouth 2 (two) times daily. Qty: 60 tablet, Refills: 0    moxifloxacin (AVELOX) 400 MG tablet Take 1 tablet (400 mg total) by mouth daily. Qty: 5 tablet, Refills: 0    potassium chloride SA (K-DUR,KLOR-CON) 20 MEQ tablet Take 1 tablet (20 mEq total) by mouth 2 (two) times daily. Qty: 60 tablet, Refills: 0      CONTINUE these medications which have CHANGED   Details  predniSONE (DELTASONE) 20 MG tablet Take 2 tablets daily for 3 days, then one tablet daily for 3 days, then half a tablet daily for 3 days and then STOP. Qty: 12 tablet, Refills: 0      CONTINUE these medications which have NOT CHANGED   Details  acidophilus (RISAQUAD) CAPS Take 1 capsule by mouth daily.      ALPHAGAN P 0.1 % SOLN Place 1 drop into both eyes 2 (two) times daily.     amiodarone (PACERONE) 200 MG tablet Take 200 mg by mouth daily.     calcium-vitamin D (OSCAL WITH D) 500-200 MG-UNIT per tablet Take 1 tablet by mouth 2 (two) times daily.      dorzolamide-timolol (COSOPT) 22.3-6.8 MG/ML ophthalmic solution Place 1 drop into both eyes 2 (two) times daily.     gabapentin (NEURONTIN) 300 MG capsule Take 1 capsule (300 mg total) by mouth 3 (three) times daily.    HYDROcodone-acetaminophen (NORCO) 5-325 MG per tablet Take 1.5 tablets by mouth  every 4 (four) hours as needed. Pain     ipratropium-albuterol (DUONEB) 0.5-2.5 (3) MG/3ML SOLN Take 3 mLs by nebulization every 4 (four) hours as needed. Shortness of Breath/Wheezing     lidocaine (LIDODERM) 5 % Place 1 patch onto the skin daily. Remove & Discard patch within 12 hours or as directed by MD    LUMIGAN 0.03 % ophthalmic solution Place 1 drop into both eyes at bedtime.     nystatin (MYCOSTATIN) 100000 UNIT/ML suspension Take 500,000 Units by mouth 4 (four) times daily. Started on 05/26/11 ended on 06/02/11     polyethylene glycol (MIRALAX / GLYCOLAX) packet Take 17 g by mouth daily.      pravastatin (PRAVACHOL) 20 MG tablet Take 20 mg by mouth daily.     SYNTHROID 100 MCG tablet Take 100 mcg by mouth daily.     warfarin (COUMADIN) 2 MG tablet Take 2 mg by mouth daily.      ESTRACE VAGINAL 0.1 MG/GM vaginal cream Place 2 g vaginally daily as needed.     senna-docusate (SENOKOT-S) 8.6-50 MG per tablet Take 1 tablet by mouth daily as needed for constipation. Qty: 30 tablet, Refills: 0      STOP taking these medications     guaiFENesin (MUCINEX) 600 MG 12 hr tablet      amLODipine (NORVASC) 5 MG tablet      amoxicillin-clavulanate (AUGMENTIN) 500-125 MG per tablet      fluconazole (  DIFLUCAN) 100 MG tablet      metoprolol (TOPROL-XL) 50 MG 24 hr tablet         Discharged Condition: Improved and stable.    Consults: Orthopedics, Dr. Romeo Apple.  Significant Diagnostic Studies: Dg Chest 1 View  06/04/2011  *RADIOLOGY REPORT*  Clinical Data: Cough, congestion, wheezing, shortness of breath, weakness, question pneumonia  CHEST - 1 VIEW  Comparison: 06/03/2011  Findings: Left subclavian sequential transvenous pacemaker leads project at right atrium and right ventricle. Enlargement of cardiac silhouette. Atherosclerotic calcification aorta. Question slight pulmonary vascular congestion. Perihilar infiltrates persist question edema. Increased opacity in left lower lobe,  could represent superimposed atelectasis or consolidation. No gross pleural effusion or pneumothorax. Bones diffusely demineralized.  IMPRESSION: Question of a persistent pulmonary edema. Persistent opacity in the left lower lobe may represent superimposed atelectasis versus consolidation, little changed. Enlargement of cardiac silhouette post pacemaker.  Original Report Authenticated By: Lollie Marrow, M.D.   Dg Chest 1 View  06/01/2011  *RADIOLOGY REPORT*  Clinical Data: Cough.  Wheezing.  Low grade fever.  Hypertension.  CHEST - 1 VIEW  Comparison: 05/20/2011  Findings: Dual lead pacer, with leads right atrium right ventricle. Artifact from the patient's bra.  Midline trachea.  Mild cardiomegaly.  No right and no definite left pleural effusion.  There is slight increase in interstitial thickening.  Mild right base volume loss.  Increased density projecting over the left lung base could be due to overlying soft tissues.  IMPRESSION:  1.  Cardiomegaly with slight increase in pulmonary interstitial prominence.  Suspect mild pulmonary venous congestion, without overt congestive failure. 2.  Increased density projecting over the left lung base.  Could be due to overlying soft tissues. Early airspace disease cannot be excluded.  PA and lateral radiographs may be informative.  Original Report Authenticated By: Consuello Bossier, M.D.   Dg Lumbar Spine Complete  05/14/2011  *RADIOLOGY REPORT*  Clinical Data: Low back right hip pain, 3-4 days duration, past history of right hip replacement in kyphoplasty  LUMBAR SPINE - COMPLETE 4+ VIEW  Comparison: 04/08/2005 Correlation:  CT lumbar spine 07/01/2006  Findings: Six non-rib bearing lumbar vertebrae. Prior vertebroplasty at the first non-rib bearing segment, grossly stable. This has been labeled as T12 on the prior CT and the same numbering system will be utilized for these radiographs.  Severe osseous demineralization limits exam. Biconvex thoracolumbar scoliosis.  Multilevel disc space narrowing endplate spur formation. Marked compression deformity of L3 and superior endplate of L4, progressive since prior CT. Mild focal kyphosis of the thoracolumbar spine at T12. Mild chronic height loss at L2 appears stable. L1 and L5 heights appear maintained. No subluxation or spondylolysis. Facet degenerative changes lower lumbar spine. Extensive atherosclerotic calcification. Visualized pelvis appears intact.  IMPRESSION: Lumbar numbering as above. Marked osseous demineralization. Prior vertebroplasty T12. Age indeterminate compression fractures of L3 and superior endplate L4, though new since 2007. Multilevel degenerative disc and facet disease changes.  Per CMS PQRS reporting requirements (PQRS Measure 24): Given the patient's age of greater than 50 and the fracture site (hip, distal radius, or spine), the patient should be tested for osteoporosis using DXA, and the appropriate treatment considered based on the DXA results.  Original Report Authenticated By: Lollie Marrow, M.D.   Dg Hip Complete Right  05/14/2011  *RADIOLOGY REPORT*  Clinical Data: Right leg pain for 3-4 days, history of prior right hip replacement and kyphoplasty  RIGHT HIP - COMPLETE 2+ VIEW  Comparison: 04/26/2004  Findings: Right  hip replacement with intact hardware. Minimal periprosthetic lucency at the proximal portion of the femoral shaft component on the frog-leg lateral view. No acute fracture, dislocation or bone destruction. Pelvis appears intact. Scattered atherosclerotic calcifications and pelvic phleboliths.  IMPRESSION: Minimal periprosthetic lucency at the proximal portion of the femoral component of the right hip prosthesis, nonspecific. Significant osseous demineralization. No additional bony abnormalities identified.  Original Report Authenticated By: Lollie Marrow, M.D.   Dg Ankle Complete Left  05/19/2011  *RADIOLOGY REPORT*  Clinical Data: Larey Seat yesterday.  Lateral ankle pain.  LEFT ANKLE  COMPLETE - 3+ VIEW  Comparison: None  Findings: There is diffuse soft tissue swelling about the ankle, especially notable in the lateral aspect.  There is a fracture of the lateral malleolus.  This is associated minimal displacement. No other fractures are identified.  No radiopaque foreign body or soft tissue gas.  IMPRESSION:  1.  Lateral malleolar fracture. 2.  Significant soft tissue swelling.  Original Report Authenticated By: Patterson Hammersmith, M.D.   Ct Angio Chest W/cm &/or Wo Cm  06/04/2011  *RADIOLOGY REPORT*  Clinical Data:   shortness of breath, weakness, ankle fracture, elevated D-dimer  CT ANGIOGRAPHY CHEST WITH CONTRAST  Technique:  Multidetector CT imaging of the chest was performed using the standard protocol during bolus administration of intravenous contrast.  Multiplanar CT image reconstructions including MIPs were obtained to evaluate the vascular anatomy.  Contrast:  100 ml Omnipaque 350  Comparison:  06/04/2011  Findings:  Hypodense elongated small filling defect within a proximal right upper lobe  segmental pulmonary artery, consistent with a small acute right upper lobe pulmonary embolus.  Best demonstrated on images 42-49.  No left-sided pulmonary embolus demonstrated.  Heart is enlarged.  No pericardial effusion.  Trace pleural effusions, slightly larger on the left.  No adenopathy.  Pacer leads evident.  No acute process in the imaged upper abdomen.  Lung windows demonstrate patchy scattered alveolar airspace disease versus edema.  Lingula and bibasilar atelectasis noted, most pronounced in the left lower lobe.  Trachea and central airways are patent.  No mucous plugging or significant bronchiectasis.  Degenerative changes of the spine with previous L1 vertebral augmentation.  Associated mild scoliosis.  Review of the MIP images confirms the above findings.  IMPRESSION: Small right upper lobe acute pulmonary embolus.  Cardiomegaly with scattered patchy alveolar airspace disease versus  edema.  Bibasilar atelectasis, worse in the left lower lobe.  Original Report Authenticated By: Judie Petit. Ruel Favors, M.D.   Ct Lumbar Spine Wo Contrast  05/14/2011  *RADIOLOGY REPORT*  Clinical Data: Continued severe back pain.  History of kyphoplasty. No recent injury.  CT LUMBAR SPINE WITHOUT CONTRAST  Technique:  Multidetector CT imaging of the lumbar spine was performed without intravenous contrast administration. Multiplanar CT image reconstructions were also generated.  Comparison: Lumbar spine radiographs 05/14/2011 and lumbar spine CT 07/01/2006.  Findings: There is a slightly progressive convex right scoliosis. The lateral alignment is stable.  There are stable vertebroplasty changes at T12.  Chronic fractures at T12 and L2 are unchanged.  As demonstrated on recent radiographs, there is a progressive superior endplate compression fracture at L3, now resulting in approximately 50% loss of vertebral body height.  There is mild osseous retropulsion. No paraspinal hematoma or discrete cortical fracture is identified.  There is vacuum phenomenon within the L2- L3 disc which is similar to the prior study.  Superior endplate compression fracture at L4 does not appear significantly changed.  However, there is new  cortical irregularity of the inferior end plate laterally on the right, most obvious on the coronal images.  The L5 vertebral body appears intact.  Spondylosis is similar to prior studies.  There is stable spinal stenosis at all 02-03 with lateral recess stenosis bilaterally.  At L3-L4, there is narrowing of the right lateral recess and right foramen.  At L4-L5, there is mild to moderate central stenosis with narrowing of both foramina.  IMPRESSION:  1.  Since the prior CT of 07/01/2006, there is a progressive superior endplate compression deformity at L3 with mild osseous retropulsion.  This does not demonstrate any definite acute features or associated paraspinal hemorrhage. 2.  Possible acute fracture  involving the inferior endplate of L4 on the right. 3.  Stable T12 and L2 compression deformity status post T12 vertebroplasty. 4.  Stable multilevel spondylosis and resulting spinal stenosis.  Original Report Authenticated By: Gerrianne Scale, M.D.   US Venous Img Lower Unilateral Right  05/30/2011  *RADIOLOGY REPORT*  Clinical Data: EDEMA;;  RIGHT LOWER EXTREMITY VENOUS DUPLEX ULTRASOUND  Technique: Gray-scale sonography with compression, as well as color and duplex ultrasound, were performed to evaluate the deep venous system from the level of the common femoral vein through the popliteal and proximal calf veins.  Comparison: None  Findings:  Normal compressibility and normal Doppler signal within the common femoral, superficial femoral and popliteal veins, down to the proximal calf veins.  No grayscale filling defects to suggest DVT.  IMPRESSION: No evidence of right lower extremity deep vein thrombosis.  Original Report Authenticated By: Cyndie Chime, M.D.   Dg Chest Portable 1 View  06/07/2011  *RADIOLOGY REPORT*  Clinical Data: CHF, follow up  PORTABLE CHEST - 1 VIEW  Comparison: Portable exam 0545 hours compared to 06/03/2001  Findings: Left subclavian sequential transvenous pacemaker leads project at right atrium and right ventricle. Enlargement of cardiac silhouette. Calcified tortuous thoracic aorta. Pulmonary vascular congestion. Minimal pulmonary edema. Persistent opacification in the left lower lobe question atelectasis versus consolidation. Minimal peribronchial thickening. No gross pleural effusion or pneumothorax. Bones demineralized.  IMPRESSION: Enlargement of cardiac silhouette with pulmonary vascular congestion and minimal pulmonary edema. Persistent left lower lobe opacification question atelectasis versus consolidation.  Original Report Authenticated By: Lollie Marrow, M.D.   Dg Chest Portable 1 View  06/03/2011  *RADIOLOGY REPORT*  Clinical Data: Shortness of breath.  Congestive  heart failure. Coronary artery disease.  PORTABLE CHEST - 1 VIEW  Comparison: 06/01/2011  Findings: Cardiomegaly stable.  Increased interstitial infiltrates and central perihilar air space disease is seen, consistent with increased pulmonary edema.  Increased atelectasis seen in the left retrocardiac lung base.  Dual lead transvenous pacemaker remains in appropriate position.  IMPRESSION:  1.  Increased pulmonary edema and left retrocardiac atelectasis. 2.  Stable cardiomegaly.  Original Report Authenticated By: Danae Orleans, M.D.   Dg Chest Portable 1 View  05/20/2011  *RADIOLOGY REPORT*  Clinical Data: Preop  PORTABLE CHEST - 1 VIEW  Comparison: 12/19/2010  Findings: Lungs are essentially clear. No pleural effusion or pneumothorax.  Stable cardiomegaly.  Left subclavian pacemaker.  IMPRESSION: No evidence of acute cardiopulmonary disease.  Stable cardiomegaly.  Original Report Authenticated By: Charline Bills, M.D.    Lab Results: Results for orders placed during the hospital encounter of 06/03/11 (from the past 48 hour(s))  PROTIME-INR     Status: Abnormal   Collection Time   06/07/11  4:04 AM      Component Value Range Comment   Prothrombin  Time 51.9 (*) 11.6 - 15.2 (seconds) RESULT REPEATED AND VERIFIED   INR 5.65 (*) 0.00 - 1.49    CBC     Status: Abnormal   Collection Time   06/07/11  4:04 AM      Component Value Range Comment   WBC 11.5 (*) 4.0 - 10.5 (K/uL)    RBC 3.91  3.87 - 5.11 (MIL/uL)    Hemoglobin 11.8 (*) 12.0 - 15.0 (g/dL)    HCT 16.1 (*) 09.6 - 46.0 (%)    MCV 90.8  78.0 - 100.0 (fL)    MCH 30.2  26.0 - 34.0 (pg)    MCHC 33.2  30.0 - 36.0 (g/dL)    RDW 04.5 (*) 40.9 - 15.5 (%)    Platelets 240  150 - 400 (K/uL)   BASIC METABOLIC PANEL     Status: Abnormal   Collection Time   06/07/11  4:04 AM      Component Value Range Comment   Sodium 139  135 - 145 (mEq/L)    Potassium 3.7  3.5 - 5.1 (mEq/L)    Chloride 102  96 - 112 (mEq/L)    CO2 29  19 - 32 (mEq/L)     Glucose, Bld 161 (*) 70 - 99 (mg/dL)    BUN 28 (*) 6 - 23 (mg/dL)    Creatinine, Ser 8.11  0.50 - 1.10 (mg/dL)    Calcium 9.5  8.4 - 10.5 (mg/dL)    GFR calc non Af Amer 48 (*) >90 (mL/min)    GFR calc Af Amer 55 (*) >90 (mL/min)   PRO B NATRIURETIC PEPTIDE     Status: Abnormal   Collection Time   06/07/11  4:04 AM      Component Value Range Comment   BNP, POC 3835.0 (*) 0 - 450 (pg/mL)   PROTIME-INR     Status: Abnormal   Collection Time   06/08/11  4:37 AM      Component Value Range Comment   Prothrombin Time 43.1 (*) 11.6 - 15.2 (seconds)    INR 4.46 (*) 0.00 - 1.49    CBC     Status: Abnormal   Collection Time   06/08/11  4:37 AM      Component Value Range Comment   WBC 10.6 (*) 4.0 - 10.5 (K/uL)    RBC 4.40  3.87 - 5.11 (MIL/uL)    Hemoglobin 12.7  12.0 - 15.0 (g/dL)    HCT 91.4  78.2 - 95.6 (%)    MCV 91.4  78.0 - 100.0 (fL)    MCH 28.9  26.0 - 34.0 (pg)    MCHC 31.6  30.0 - 36.0 (g/dL)    RDW 21.3 (*) 08.6 - 15.5 (%)    Platelets 265  150 - 400 (K/uL)   BASIC METABOLIC PANEL     Status: Abnormal   Collection Time   06/08/11  4:37 AM      Component Value Range Comment   Sodium 141  135 - 145 (mEq/L)    Potassium 3.5  3.5 - 5.1 (mEq/L)    Chloride 98  96 - 112 (mEq/L)    CO2 35 (*) 19 - 32 (mEq/L)    Glucose, Bld 104 (*) 70 - 99 (mg/dL)    BUN 29 (*) 6 - 23 (mg/dL)    Creatinine, Ser 5.78  0.50 - 1.10 (mg/dL)    Calcium 9.8  8.4 - 10.5 (mg/dL)    GFR calc non Af Amer 47 (*) >90 (  mL/min)    GFR calc Af Amer 54 (*) >90 (mL/min)   PRO B NATRIURETIC PEPTIDE     Status: Abnormal   Collection Time   06/08/11  4:37 AM      Component Value Range Comment   BNP, POC 3887.0 (*) 0 - 450 (pg/mL)    Recent Results (from the past 240 hour(s))  MRSA PCR SCREENING     Status: Normal   Collection Time   06/03/11  3:55 PM      Component Value Range Status Comment   MRSA by PCR NEGATIVE  NEGATIVE  Final      Hospital Course: This very pleasant 77 year old lady was admitted with  dyspnea. Prior to this admission, she had recently been discharged having had a left ankle fracture and was receiving rehabilitation at the [facility]. Her symptoms started 3-4 days prior to admission. On this admission she was found to have a pulmonary embolism by CT angiogram chest. Also clinically and radiologically she was in congestive heart failure. This was diastolic in nature. She also had an element of bronchospasm/bronchitis. Therefore she was treated with intravenous antibiotics, diuretics and steroids which were converted to oral steroids. Today, her breathing appears to be less labored and she feels overall improved. Her oxygen needs are minimal. Unfortunately, her overall condition looks worse than on her previous admissions and I have informed her that I feel that it is possible she may deteriorate again. However, for the time being she is clinically stable and does not require further hospitalization. Her Coumadin has been adjusted by pharmacy although her INR today is supratherapeutic. This will need to be monitored closely. There is no evidence of bleeding.  Discharge Exam: Blood pressure 143/84, pulse 65, temperature 98.4 F (36.9 C), temperature source Oral, resp. rate 18, height 5\' 5"  (1.651 m), weight 70.2 kg (154 lb 12.2 oz), SpO2 94.00%. She looks chronically sick. There is no increased work of breathing today. There is no peripheral central cyanosis. Heart sounds are present and appear to be in sinus rhythm. Jugular venous pressure not raised. Lung fields show bilateral scattered rhonchi. There are also a few inspiratory crackles. There is no bronchial breathing. She is alert and orientated without any focal neurological signs.  Disposition: Skilled nursing facility, the [facility].  Discharge Orders    Future Appointments: Provider: Department: Dept Phone: Center:   08/09/2011 11:45 AM Fuller Canada, MD [facility] [phone] [facility]     Future Orders Please  Complete By Expires   Diet - low sodium heart healthy      Increase activity slowly      Discharge instructions      Comments:   He will require Coumadin for lease 3 months for the pulmonary embolism. Your blood work needs to be monitored regularly.        SignedWilson Singer 06/08/2011, 11:32 AM

## 2011-06-08 NOTE — ED Notes (Signed)
I personally performed the services described in this documentation, which was scribed in my presence. The recorded information has been reviewed and considered.   [name], MD 06/08/11 2044

## 2011-06-08 NOTE — Progress Notes (Signed)
ANTICOAGULATION CONSULT NOTE  Pharmacy Consult for Lovenox/ Warfarin  Indication: pulmonary embolus (new)/ Afib (home therapy)  No Known Allergies  Patient Measurements: Height: 5\' 5"  (165.1 cm) Weight: 154 lb 12.2 oz (70.2 kg) IBW/kg (Calculated) : 57   Vital Signs: Temp: 98.4 F (36.9 C) (10/05 0603) Temp src: Oral (10/05 0603) BP: 143/84 mmHg (10/05 0603) Pulse Rate: 65  (10/05 0603)  Labs:  Basename 06/08/11 0437 06/07/11 0404 06/06/11 0541  HGB 12.7 11.8* --  HCT 40.2 35.5* 35.4*  PLT 265 240 214  APTT -- -- --  LABPROT 43.1* 51.9* 40.6*  INR 4.46* 5.65* 4.13*  HEPARINUNFRC -- -- --  CREATININE 1.07 1.06 0.82  CRCLEARANCE -- -- --  CKTOTAL -- -- --  CKMB -- -- --  TROPONINI -- -- --   Medical History: Past Medical History  Diagnosis Date  . Hypertension   . High cholesterol   . Arthritis   . Hypothyroidism   . Coronary artery disease   . Arrhythmia     pacemaker  . Glaucoma    Medications:  Scheduled:     . albuterol  2.5 mg Nebulization Q4H  . amiodarone  200 mg Oral Daily  . antiseptic oral rinse   Mouth Rinse Daily  . bimatoprost  1 drop Both Eyes QHS  . brimonidine  1 drop Both Eyes BID  . calcium-vitamin D  1 tablet Oral BID  . docusate sodium  100 mg Oral BID  . dorzolamide-timolol  1 drop Both Eyes BID  . fluconazole  100 mg Oral Daily  . furosemide  80 mg Intravenous TID  . gabapentin  300 mg Oral TID  . ipratropium  0.5 mg Nebulization Q4H  . levothyroxine  100 mcg Oral Daily  . lidocaine  1 patch Transdermal Q24H  . nystatin  500,000 Units Oral QID  . piperacillin-tazobactam (ZOSYN)  IV  3.375 g Intravenous Q8H  . polyethylene glycol  17 g Oral Daily  . potassium chloride  20 mEq Oral BID  . predniSONE  40 mg Oral Daily  . saccharomyces boulardii  250 mg Oral Daily  . simvastatin  10 mg Oral q1800  . sodium chloride  3 mL Intravenous Q12H  . vancomycin  1,250 mg Intravenous Q24H  . DISCONTD: enoxaparin (LOVENOX) injection  1.5  mg/kg Subcutaneous Q24H  . DISCONTD: furosemide  80 mg Intravenous BID  . DISCONTD: metoprolol  50 mg Oral Daily   Assessment: Lovenox d/c'd per MD due to INR > 5 INR supra-therapeutic No reported bleeding Cbc stable  Goal of Therapy: Maintain INR 2-3   Plan: NO coumadin today INR daily until stable and therapeutic Monitor for any s/s bleeding Monitor CBC   [name] J 06/08/2011,7:57 AM

## 2011-06-10 NOTE — Progress Notes (Signed)
Encounter addended by: Karen Kays on: 06/10/2011  2:01 PM<BR>     Documentation filed: Flowsheet VN

## 2011-06-11 ENCOUNTER — Ambulatory Visit (HOSPITAL_COMMUNITY): Payer: Medicare Other | Attending: Internal Medicine

## 2011-06-11 ENCOUNTER — Encounter: Payer: Self-pay | Admitting: Orthopedic Surgery

## 2011-06-11 DIAGNOSIS — J189 Pneumonia, unspecified organism: Secondary | ICD-10-CM | POA: Insufficient documentation

## 2011-06-11 DIAGNOSIS — Z09 Encounter for follow-up examination after completed treatment for conditions other than malignant neoplasm: Secondary | ICD-10-CM | POA: Insufficient documentation

## 2011-06-12 ENCOUNTER — Ambulatory Visit: Payer: Medicare Other | Admitting: Orthopedic Surgery

## 2011-06-12 ENCOUNTER — Ambulatory Visit (HOSPITAL_COMMUNITY)
Admit: 2011-06-12 | Discharge: 2011-06-12 | Disposition: A | Discharge status: Skilled Nursing Facility | Payer: Medicare Other | Source: Skilled Nursing Facility | Attending: Internal Medicine | Admitting: Internal Medicine

## 2011-06-12 DIAGNOSIS — R0609 Other forms of dyspnea: Secondary | ICD-10-CM | POA: Insufficient documentation

## 2011-06-12 DIAGNOSIS — R0989 Other specified symptoms and signs involving the circulatory and respiratory systems: Secondary | ICD-10-CM | POA: Insufficient documentation

## 2011-06-12 LAB — BLOOD GAS, ARTERIAL
Patient temperature: 37
TCO2: 20.5 mmol/L (ref 0–100)
pH, Arterial: 7.477 — ABNORMAL HIGH (ref 7.350–7.400)

## 2011-07-23 NOTE — H&P (Signed)
[name]  MRN: [id number]  DOB/AGE: 1926/10/29 77 y.o.  Primary Care Physician:FUSCO,LAWRENCE J., MD  Admit date: 06/03/2011  Chief Complaint: Shortness of breath  HPI: This is an 77 year old female with a history of paroxismal atrial fibrillation recent history of left ankle fracture, recently discharged and sent [facility] nursing home for rehabilitation, who presents to the ER with a chief complaint of shortness of breath. Her symptoms started about 3-4 days ago. She initially developed a nonproductive cough associated with some wheezing. She then started noticing bilateral lower extremities edema. She subsequently started experiencing dyspnea on exertion progressing to dyspnea at rest . She denied any fever chills or rigors. She denied any chest pain but has experienced significant orthopnea and paroxysmal nocturnal dyspnea. She denies any nausea vomiting abdominal pain per se . She denies any prior history of DVT or PE .  Past Medical History   Diagnosis  Date   .  Hypertension    .  High cholesterol    .  Arthritis    .  Hypothyroidism    .  Coronary artery disease    .  Arrhythmia      pacemaker   .  Glaucoma     Past Surgical History   Procedure  Date   .  Total hip arthroplasty      right side   .  Pacemaker insertion    .  Appendectomy    .  Abdominal hysterectomy     Prior to Admission medications   Medication  Sig  Start Date  End Date  Taking?  Authorizing Provider   acidophilus (RISAQUAD) CAPS  Take 1 capsule by mouth daily.  06/02/11  06/09/11  Yes  Historical Provider, MD   ALPHAGAN P 0.1 % SOLN  Place 1 drop into both eyes 2 (two) times daily.  04/09/11   Yes  Historical Provider, MD   amiodarone (PACERONE) 200 MG tablet  Take 200 mg by mouth daily.  04/30/11   Yes  Historical Provider, MD   amLODipine (NORVASC) 5 MG tablet  Take 5 mg by mouth daily.  04/01/11   Yes  Historical Provider, MD   amoxicillin-clavulanate (AUGMENTIN) 500-125 MG per tablet  Take 1 tablet by  mouth 2 (two) times daily.  06/02/11  06/09/11  Yes  Historical Provider, MD   calcium-vitamin D (OSCAL WITH D) 500-200 MG-UNIT per tablet  Take 1 tablet by mouth 2 (two) times daily.    Yes  Historical Provider, MD   dorzolamide-timolol (COSOPT) 22.3-6.8 MG/ML ophthalmic solution  Place 1 drop into both eyes 2 (two) times daily.  04/09/11   Yes  Historical Provider, MD   fluconazole (DIFLUCAN) 100 MG tablet  Take 100 mg by mouth daily. Started 05/29/11 until 06/03/11    Yes  Historical Provider, MD   gabapentin (NEURONTIN) 300 MG capsule  Take 1 capsule (300 mg total) by mouth 3 (three) times daily.  05/18/11  05/17/12  Yes  Belkys Regalado, MD   guaiFENesin (MUCINEX) 600 MG 12 hr tablet  Take 600 mg by mouth 2 (two) times daily.  06/01/11  06/05/11  Yes  Historical Provider, MD   HYDROcodone-acetaminophen (NORCO) 5-325 MG per tablet  Take 1.5 tablets by mouth every 4 (four) hours as needed. Pain    Yes  Historical Provider, MD   ipratropium-albuterol (DUONEB) 0.5-2.5 (3) MG/3ML SOLN  Take 3 mLs by nebulization every 4 (four) hours as needed. Shortness of Breath/Wheezing    Yes  Historical  Provider, MD   lidocaine (LIDODERM) 5 %  Place 1 patch onto the skin daily. Remove & Discard patch within 12 hours or as directed by MD    Yes  Historical Provider, MD   LUMIGAN 0.03 % ophthalmic solution  Place 1 drop into both eyes at bedtime.  04/24/11   Yes  Historical Provider, MD   metoprolol (TOPROL-XL) 50 MG 24 hr tablet  Take 50 mg by mouth daily.  04/09/11   Yes  Historical Provider, MD   nystatin (MYCOSTATIN) 100000 UNIT/ML suspension  Take 500,000 Units by mouth 4 (four) times daily. Started on 05/26/11 ended on 06/02/11    Yes  Historical Provider, MD   polyethylene glycol (MIRALAX / GLYCOLAX) packet  Take 17 g by mouth daily.    Yes  Historical Provider, MD   pravastatin (PRAVACHOL) 20 MG tablet  Take 20 mg by mouth daily.  04/14/11   Yes  Historical Provider, MD   predniSONE (DELTASONE) 10 MG tablet  Take 10 mg  by mouth daily. Patient started taper on 05/24/11 and ended on 05/29/11. Take 3 tablets daily for 2 days, then 2 tablets daily for 2 days, then 1 tablet daily for 1 day, then take 0.5 tablet daily for last day.    Yes  Historical Provider, MD   SYNTHROID 100 MCG tablet  Take 100 mcg by mouth daily.  04/11/11   Yes  Historical Provider, MD   warfarin (COUMADIN) 2 MG tablet  Take 2 mg by mouth daily.  05/23/11  05/22/12  Yes  Nimish C Gosrani   ESTRACE VAGINAL 0.1 MG/GM vaginal cream  Place 2 g vaginally daily as needed.  04/27/11    Historical Provider, MD   HYDROcodone-acetaminophen (NORCO) 5-325 MG per tablet  Take 1.5 tablets by mouth every 6 (six) hours as needed.  05/23/11  06/02/11   Nimish C Gosrani   senna-docusate (SENOKOT-S) 8.6-50 MG per tablet  Take 1 tablet by mouth daily as needed for constipation.  05/18/11  05/17/12   Hartley Barefoot, MD   Allergies: No Known Allergies  History reviewed. No pertinent family history.  Social History: reports that she has never smoked. She does not have any smokeless tobacco history on file. She reports that she does not drink alcohol or use illicit drugs. She is currently in [facility] nursing home.  ROS:  Constitutional: Denies fever, chills, diaphoresis, appetite change and fatigue.  HEENT: Denies photophobia, eye pain, redness, hearing loss, ear pain, congestion, sore throat, rhinorrhea, sneezing, mouth sores, trouble swallowing, neck pain, neck stiffness and tinnitus.  Cardiovascular: Denies chest pain, palpitations and leg swelling.  Gastrointestinal: Denies nausea, vomiting, abdominal pain, diarrhea, constipation, blood in stool and abdominal distention.  Genitourinary: Denies dysuria, urgency, frequency, hematuria, flank pain and difficulty urinating.  Musculoskeletal: Denies myalgias, back pain, joint swelling, arthralgias and gait problem.  Skin: Denies pallor, rash and wound.  Neurological: Denies dizziness, seizures, syncope, weakness,  light-headedness, numbness and headaches.  Hematological: Denies adenopathy. Easy bruising, personal or family bleeding history  Psychiatric/Behavioral: Denies suicidal ideation, mood changes, confusion, nervousness, sleep disturbance and agitation  PHYSICAL EXAM:  Blood pressure 151/74, pulse 60, temperature 98.9 F (37.2 C), resp. rate 18, height 5\' 5"  (1.651 m), weight 81.647 kg (180 lb), SpO2 93.00%.  Constitutional: Vital signs reviewed. Patient is a well-developed and well-nourished ,and cooperative with exam. Alert and oriented x3.  Head: Normocephalic and atraumatic  Ear: TM normal bilaterally  Mouth: no erythema or exudates, MMM  Eyes: PERRL,  EOMI, conjunctivae normal, No scleral icterus.  Neck: Supple, Trachea midline normal ROM, No JVD, mass, thyromegaly, or carotid bruit present.  Cardiovascular: RRR, S1 normal, S2 normal, no MRG, pulses symmetric and intact bilaterally  Pulmonary/Chest: CTAB, positive for Wheezing , rales, or rhonchi. Mild to moderate accessory muscle use .  Abdominal: Soft. Non-tender, non-distended, bowel sounds are normal, no masses, organomegaly, or guarding present.  GU: no CVA tenderness Musculoskeletal: No joint deformities, erythema, or stiffness, ROM full and no nontender Hematology: no cervical, inginal, or axillary adenopathy.  Neurological: A&O x3, Strenght is normal and symmetric bilaterally, cranial nerve II-XII are grossly intact, no focal motor deficit, sensory intact to light touch bilaterally.  Skin: Warm, dry and intact. No rash, cyanosis, or clubbing.  Psychiatric: Normal mood and affect. speech and behavior is normal. Judgment and thought content normal. Cognition and memory are normal.  EKG: Shows a paced rhythm at a rate of 65 beats per minute with no acute ST-T segment changes.  Results for orders placed during the hospital encounter of 06/03/11 (from the past 48 hour(s))   CBC Status: Abnormal    Collection Time    06/03/11 10:15 AM     Component  Value  Range  Comment    WBC  9.9  4.0 - 10.5 (K/uL)     RBC  4.17  3.87 - 5.11 (MIL/uL)     Hemoglobin  12.5  12.0 - 15.0 (g/dL)     HCT  40.9  81.1 - 46.0 (%)     MCV  92.1  78.0 - 100.0 (fL)     MCH  30.0  26.0 - 34.0 (pg)     MCHC  32.6  30.0 - 36.0 (g/dL)     RDW  91.4 (*)  78.2 - 15.5 (%)     Platelets  180  150 - 400 (K/uL)    DIFFERENTIAL Status: Abnormal    Collection Time    06/03/11 10:15 AM   Component  Value  Range  Comment    Neutrophils Relative  83 (*)  43 - 77 (%)     Neutro Abs  8.2 (*)  1.7 - 7.7 (K/uL)     Lymphocytes Relative  5 (*)  12 - 46 (%)     Lymphs Abs  0.5 (*)  0.7 - 4.0 (K/uL)     Monocytes Relative  12  3 - 12 (%)     Monocytes Absolute  1.2 (*)  0.1 - 1.0 (K/uL)     Eosinophils Relative  0  0 - 5 (%)     Eosinophils Absolute  0.0  0.0 - 0.7 (K/uL)     Basophils Relative  0  0 - 1 (%)     Basophils Absolute  0.0  0.0 - 0.1 (K/uL)    BASIC METABOLIC PANEL Status: Abnormal    Collection Time    06/03/11 10:15 AM   Component  Value  Range  Comment    Sodium  135  135 - 145 (mEq/L)     Potassium  3.9  3.5 - 5.1 (mEq/L)     Chloride  99  96 - 112 (mEq/L)     CO2  24  19 - 32 (mEq/L)     Glucose, Bld  126 (*)  70 - 99 (mg/dL)     BUN  20  6 - 23 (mg/dL)     Creatinine, Ser  9.56  0.50 - 1.10 (mg/dL)     Calcium  9.6  8.4 - 10.5 (mg/dL)     GFR calc non Af Amer  57 (*)  >60 (mL/min)     GFR calc Af Amer  >60  >60 (mL/min)    CARDIAC PANEL(CRET KIN+CKTOT+MB+TROPI) Status: Normal    Collection Time    06/03/11 10:15 AM   Component  Value  Range  Comment    Total CK  36  7 - 177 (U/L)     CK, MB  1.3  0.3 - 4.0 (ng/mL)     Troponin I  <0.30  <0.30 (ng/mL)     Relative Index  RELATIVE INDEX IS INVALID  0.0 - 2.5    URINALYSIS, ROUTINE W REFLEX MICROSCOPIC Status: Abnormal    Collection Time    06/03/11 11:42 AM   Component  Value  Range  Comment    Color, Urine  YELLOW  YELLOW     Appearance  HAZY (*)  CLEAR     Specific Gravity, Urine   1.020  1.005 - 1.030     pH  7.0  5.0 - 8.0     Glucose, UA  NEGATIVE  NEGATIVE (mg/dL)     Hgb urine dipstick  NEGATIVE  NEGATIVE     Bilirubin Urine  NEGATIVE  NEGATIVE     Ketones, ur  NEGATIVE  NEGATIVE (mg/dL)     Protein, ur  NEGATIVE  NEGATIVE (mg/dL)     Urobilinogen, UA  0.2  0.0 - 1.0 (mg/dL)     Nitrite  NEGATIVE  NEGATIVE     Leukocytes, UA  NEGATIVE  NEGATIVE  MICROSCOPIC NOT DONE ON URINES WITH NEGATIVE PROTEIN, BLOOD, LEUKOCYTES, NITRITE, OR GLUCOSE <1000 mg/dL.    Dg Chest 1 View  06/01/2011 *RADIOLOGY REPORT* IMPRESSION: 1. Cardiomegaly with slight increase in pulmonary interstitial prominence. Suspect mild pulmonary venous congestion, without overt congestive failure. 2. Increased density projecting over the left lung base. Could be due to overlying soft tissues. Early airspace disease cannot be excluded. PA and lateral radiographs may be informative. Original Report Authenticated By: Consuello Bossier, M.D.  Dg Chest Portable 1 View  06/03/2011 . IMPRESSION: 1. Increased pulmonary edema and left retrocardiac atelectasis. 2. Stable cardiomegaly. Original Report Authenticated By: Danae Orleans, M.D.  Impression:  Principal Problem:  1. CHF exacerbation: Patient shortness of breath is most likely consistent with diastolic heart failure she also has a history of paroxysmal atrial fibrillation however she is in normal sinus rhythm currently. Given her recent history of left ankle fracture we will also go ahead and rule out an DVT and a PE. The patient does appear to be volume overloaded clinically and therefore we will start her on IV Lasix are the ordered a 2-D echo. We will cycle her cardiac enzymes. Given the fact that she has some mild accessory muscle use will go ahead and monitor her in a step down bed for tonight. We will start her on diuresing with Lasix check her thyroid function, check a BNP, check a d-dimer, was started on nebulizer treatments for wheezing.  2. Chronic pain  syndrome secondary to ARTHRITIS, LUMBOSACRAL SPINE, Sciatica  3 . PAF (paroxysmal atrial fibrillation) patient will continue with the amiodarone and Coumadin per pharmacy protocol.  4. Sick sinus syndrome: Patient has a pacemaker for sick sinus syndrome and we will continue to monitor her on telemetry  5. HTN (hypertension): This appears to be fairly well controlled however patient is currently on a nitroglycerin drip in the ED we will titrate  the nitroglycerin drip off and initiate her on end or an ACE inhibitor.  6. Hypothyroidism: We'll check a TSH  7. Closed left ankle fracture: Patient is currently in a left ankle splint we will continue with weightbearing as tolerated.  #8 CODE STATUS patient is a DNR/DNI  [name]  06/03/2011, 1:14 PM

## 2011-08-03 ENCOUNTER — Ambulatory Visit (HOSPITAL_COMMUNITY): Payer: Medicare Other | Attending: Internal Medicine

## 2011-08-03 DIAGNOSIS — R062 Wheezing: Secondary | ICD-10-CM | POA: Insufficient documentation

## 2011-08-03 DIAGNOSIS — J189 Pneumonia, unspecified organism: Secondary | ICD-10-CM | POA: Insufficient documentation

## 2011-08-09 ENCOUNTER — Ambulatory Visit: Payer: Medicare Other | Admitting: Orthopedic Surgery

## 2011-08-10 ENCOUNTER — Encounter (HOSPITAL_COMMUNITY): Payer: Self-pay

## 2011-08-10 ENCOUNTER — Ambulatory Visit (HOSPITAL_COMMUNITY)
Admit: 2011-08-10 | Discharge: 2011-08-10 | Disposition: A | Discharge status: Skilled Nursing Facility | Payer: Medicare Other | Source: Skilled Nursing Facility | Attending: Internal Medicine | Admitting: Internal Medicine

## 2011-08-10 DIAGNOSIS — R7989 Other specified abnormal findings of blood chemistry: Secondary | ICD-10-CM | POA: Insufficient documentation

## 2011-08-10 DIAGNOSIS — Z86711 Personal history of pulmonary embolism: Secondary | ICD-10-CM | POA: Insufficient documentation

## 2011-08-10 HISTORY — DX: Other pulmonary embolism without acute cor pulmonale: I26.99

## 2011-08-10 MED ORDER — IOHEXOL 350 MG/ML SOLN
80.0000 mL | Freq: Once | INTRAVENOUS | Status: AC | PRN
  Administered 2011-08-10: 80 mL via INTRAVENOUS

## 2011-08-14 ENCOUNTER — Ambulatory Visit (HOSPITAL_COMMUNITY): Payer: Medicare Other | Attending: Internal Medicine

## 2011-08-14 ENCOUNTER — Encounter: Payer: Self-pay | Admitting: Orthopedic Surgery

## 2011-08-14 ENCOUNTER — Ambulatory Visit (INDEPENDENT_AMBULATORY_CARE_PROVIDER_SITE_OTHER): Payer: Medicare Other | Admitting: Orthopedic Surgery

## 2011-08-14 VITALS — BP 142/70 | Ht 65.0 in | Wt 154.0 lb

## 2011-08-14 DIAGNOSIS — M48061 Spinal stenosis, lumbar region without neurogenic claudication: Secondary | ICD-10-CM

## 2011-08-14 DIAGNOSIS — Z4789 Encounter for other orthopedic aftercare: Secondary | ICD-10-CM | POA: Insufficient documentation

## 2011-08-14 DIAGNOSIS — S82892A Other fracture of left lower leg, initial encounter for closed fracture: Secondary | ICD-10-CM

## 2011-08-14 DIAGNOSIS — S82899A Other fracture of unspecified lower leg, initial encounter for closed fracture: Secondary | ICD-10-CM

## 2011-08-14 NOTE — Progress Notes (Signed)
Routine follow-up visit  Diagnosis spinal stenosis Diagnosis degenerative disc disease Diagnosis recent bout of acute sciatica treated in the hospital with medications and [facility] doing well scheduled to go home tomorrow Diagnosis LEFT lateral malleolus fracture nondisplaced.  Treated with bracing.  She reports she is doing well her leg pain has all but resolved.  She has chronic pain from spinal stenosis and is treated with oral pain medications and gabapentin.  She will have an x-ray at the hospital of her LEFT ankle  2 follow up in 3 months for her routine follow-up regarding her medications for her chronic pain and spinal stenosis.

## 2011-08-14 NOTE — Patient Instructions (Signed)
[facility] WILL DISCHARGE

## 2011-08-21 ENCOUNTER — Other Ambulatory Visit (HOSPITAL_BASED_OUTPATIENT_CLINIC_OR_DEPARTMENT_OTHER): Payer: Self-pay | Admitting: Internal Medicine

## 2011-08-23 ENCOUNTER — Encounter (HOSPITAL_COMMUNITY): Payer: Self-pay | Admitting: Pharmacy Technician

## 2011-08-23 ENCOUNTER — Other Ambulatory Visit: Payer: Self-pay | Admitting: Cardiovascular Disease

## 2011-08-29 MED ORDER — CHLORHEXIDINE GLUCONATE 4 % EX LIQD
60.0000 mL | Freq: Once | CUTANEOUS | Status: DC
  Filled 2011-08-29: qty 60

## 2011-08-29 MED ORDER — CEFAZOLIN SODIUM 1-5 GM-% IV SOLN: 1.0000 g | INTRAVENOUS | Status: DC

## 2011-08-29 MED ORDER — SODIUM CHLORIDE 0.9 % IV SOLN: INTRAVENOUS | Status: DC

## 2011-08-30 ENCOUNTER — Encounter (HOSPITAL_COMMUNITY)
Admission: RE | Disposition: A | Discharge status: Home / Self Care | Payer: Self-pay | Source: Ambulatory Visit | Attending: Cardiovascular Disease

## 2011-08-30 ENCOUNTER — Ambulatory Visit (HOSPITAL_COMMUNITY)
Admission: RE | Admit: 2011-08-30 | Discharge: 2011-08-30 | Disposition: A | Discharge status: Home / Self Care | Payer: Medicare Other | Source: Ambulatory Visit | Attending: Cardiovascular Disease | Admitting: Cardiovascular Disease

## 2011-08-30 DIAGNOSIS — I495 Sick sinus syndrome: Secondary | ICD-10-CM

## 2011-08-30 DIAGNOSIS — Z45018 Encounter for adjustment and management of other part of cardiac pacemaker: Secondary | ICD-10-CM | POA: Insufficient documentation

## 2011-08-30 HISTORY — PX: PACEMAKER GENERATOR CHANGE: SHX5481

## 2011-08-30 LAB — PROTIME-INR: Prothrombin Time: 16.4 seconds — ABNORMAL HIGH (ref 11.6–15.2)

## 2011-08-30 LAB — SURGICAL PCR SCREEN: MRSA, PCR: NEGATIVE

## 2011-08-30 SURGERY — PACEMAKER GENERATOR CHANGE
Anesthesia: LOCAL

## 2011-08-30 MED ORDER — MUPIROCIN 2 % EX OINT
TOPICAL_OINTMENT | CUTANEOUS | Status: AC
  Administered 2011-08-30: 1 via NASAL
  Filled 2011-08-30: qty 22

## 2011-08-30 MED ORDER — LIDOCAINE HCL (PF) 1 % IJ SOLN
INTRAMUSCULAR | Status: AC
  Filled 2011-08-30: qty 60

## 2011-08-30 MED ORDER — MUPIROCIN 2 % EX OINT
TOPICAL_OINTMENT | Freq: Two times a day (BID) | CUTANEOUS | Status: DC
  Administered 2011-08-30: 1 via NASAL

## 2011-08-30 MED ORDER — PROMETHAZINE HCL 12.5 MG RE SUPP: 12.5000 mg | Freq: Four times a day (QID) | RECTAL | Status: DC | PRN

## 2011-08-30 MED ORDER — HYDROCODONE-ACETAMINOPHEN 5-325 MG PO TABS: 1.0000 | ORAL_TABLET | Freq: Four times a day (QID) | ORAL | Status: DC | PRN

## 2011-08-30 MED ORDER — ONDANSETRON HCL 4 MG/2ML IJ SOLN: 4.0000 mg | Freq: Four times a day (QID) | INTRAMUSCULAR | Status: DC | PRN

## 2011-08-30 MED ORDER — SODIUM CHLORIDE 0.9 % IV SOLN: 250.0000 mL | INTRAVENOUS | Status: DC | PRN

## 2011-08-30 MED ORDER — SODIUM CHLORIDE 0.9 % IJ SOLN: 3.0000 mL | INTRAMUSCULAR | Status: DC | PRN

## 2011-08-30 MED ORDER — ACETAMINOPHEN 325 MG PO TABS: 325.0000 mg | ORAL_TABLET | ORAL | Status: DC | PRN

## 2011-08-30 MED ORDER — HEPARIN (PORCINE) IN NACL 2-0.9 UNIT/ML-% IJ SOLN
INTRAMUSCULAR | Status: AC
  Filled 2011-08-30: qty 1000

## 2011-08-30 MED ORDER — ACETAMINOPHEN 500 MG PO TABS: 1000.0000 mg | ORAL_TABLET | Freq: Four times a day (QID) | ORAL | Status: DC

## 2011-08-30 MED ORDER — SODIUM CHLORIDE 0.9 % IR SOLN
Status: DC
  Filled 2011-08-30: qty 2

## 2011-08-30 MED ORDER — SODIUM CHLORIDE 0.9 % IJ SOLN: 3.0000 mL | Freq: Two times a day (BID) | INTRAMUSCULAR | Status: DC

## 2011-08-30 NOTE — Op Note (Signed)
Procedure report  Procedure performed:  1. Dual chamber generator changeout   Reason for procedure:  1. Device generator at elective replacement interval  Procedure performed by:  Thurmon Fair, MD  Complications:  None  Estimated blood loss:  <5 mL  Medications administered during procedure:  Ancef 1 g intravenously,  lidocaine 1% 30 mL locally,   Device details:   Personnel officer model number ADDR L1, serial number [id number]  Right atrial lead (chronic) Medtronic [id number], serial [id number] (implanted 11/17/2004) Right ventricular lead (chronic) Medtronic [id number],serial number [id number] (implanted 11/17/2004)  Explanted generator Medtronic Enrhythm,  model number [id number], serial number  [id number] (implanted 11/17/2004)  Procedure details:  After the risks and benefits of the procedure were discussed the patient provided informed consent. She was brought to the cardiac catheter lab in the fasting state. The patient was prepped and draped in usual sterile fashion. Local anesthesia with 1% lidocaine was administered to to the left infraclavicular area. A 5-6cm horizontal incision was made parallel with and 2-3 cm caudal to the left clavicle, in the area of an old scar. An older scar was seen closer to the left clavicle. Using minimal electrocautery and mostly sharp and blunt dissection the prepectoral pocket was opened carefully to avoid injury to thet loops of chronic leads. Extensive dissection was necessary. The device was explanted. The pocket was carefully inspected for hemostasis and flushed with copious amounts of antibiotic solution.  The leads were disconnected from the old generator and testing of the lead parameters later showed excellent values. The new generator was connected to the chronic leads, with appropriate pacing noted.   The entire system was then carefully inserted in the pocket with care been taking that the leads and device assumed a  comfortable position without pressure on the incision. Great care was taken that the leads be located deep to the generator. The pocket was then closed in layers using 2 layers of 2-0 Vicryl and cutaneous staples after which a sterile dressing was applied.   At the end of the procedure the following lead parameters were encountered:   Right atrial lead sensed P waves 2.5 mV, impedance 540 ohms, threshold 0.5 at 0.5 ms pulse width.  Right ventricular lead sensed R waves  22.6 mV, impedance 528 ohms, threshold 0.8 at 0.5 ms pulse width.  Thurmon Fair, MD, [facility] [phone] 1:38 PM

## 2011-08-30 NOTE — H&P (Signed)
Date of Initial H&P:   History reviewed, patient examined, no change in status, stable for surgery. Thurmon Fair, MD, [facility] [phone] 4:50 PM

## 2011-09-24 ENCOUNTER — Emergency Department (HOSPITAL_COMMUNITY): Payer: Medicare Other

## 2011-09-24 ENCOUNTER — Observation Stay (HOSPITAL_COMMUNITY)
Admission: EM | Admit: 2011-09-24 | Discharge: 2011-09-26 | Disposition: A | Discharge status: Home / Self Care | Payer: Medicare Other | Attending: Internal Medicine | Admitting: Internal Medicine

## 2011-09-24 ENCOUNTER — Encounter (HOSPITAL_COMMUNITY): Payer: Self-pay | Admitting: *Deleted

## 2011-09-24 DIAGNOSIS — W19XXXA Unspecified fall, initial encounter: Secondary | ICD-10-CM | POA: Insufficient documentation

## 2011-09-24 DIAGNOSIS — E039 Hypothyroidism, unspecified: Secondary | ICD-10-CM | POA: Insufficient documentation

## 2011-09-24 DIAGNOSIS — Z96649 Presence of unspecified artificial hip joint: Secondary | ICD-10-CM | POA: Insufficient documentation

## 2011-09-24 DIAGNOSIS — Z86711 Personal history of pulmonary embolism: Secondary | ICD-10-CM | POA: Insufficient documentation

## 2011-09-24 DIAGNOSIS — M129 Arthropathy, unspecified: Secondary | ICD-10-CM | POA: Insufficient documentation

## 2011-09-24 DIAGNOSIS — I4891 Unspecified atrial fibrillation: Secondary | ICD-10-CM | POA: Insufficient documentation

## 2011-09-24 DIAGNOSIS — I495 Sick sinus syndrome: Secondary | ICD-10-CM | POA: Diagnosis present

## 2011-09-24 DIAGNOSIS — I2510 Atherosclerotic heart disease of native coronary artery without angina pectoris: Secondary | ICD-10-CM | POA: Insufficient documentation

## 2011-09-24 DIAGNOSIS — S2231XA Fracture of one rib, right side, initial encounter for closed fracture: Secondary | ICD-10-CM | POA: Diagnosis present

## 2011-09-24 DIAGNOSIS — Z950 Presence of cardiac pacemaker: Secondary | ICD-10-CM | POA: Insufficient documentation

## 2011-09-24 DIAGNOSIS — H548 Legal blindness, as defined in USA: Secondary | ICD-10-CM | POA: Insufficient documentation

## 2011-09-24 DIAGNOSIS — Z79899 Other long term (current) drug therapy: Secondary | ICD-10-CM | POA: Insufficient documentation

## 2011-09-24 DIAGNOSIS — H409 Unspecified glaucoma: Secondary | ICD-10-CM | POA: Insufficient documentation

## 2011-09-24 DIAGNOSIS — Y998 Other external cause status: Secondary | ICD-10-CM | POA: Insufficient documentation

## 2011-09-24 DIAGNOSIS — S2239XA Fracture of one rib, unspecified side, initial encounter for closed fracture: Principal | ICD-10-CM | POA: Insufficient documentation

## 2011-09-24 DIAGNOSIS — I480 Paroxysmal atrial fibrillation: Secondary | ICD-10-CM | POA: Diagnosis present

## 2011-09-24 DIAGNOSIS — Y92009 Unspecified place in unspecified non-institutional (private) residence as the place of occurrence of the external cause: Secondary | ICD-10-CM | POA: Insufficient documentation

## 2011-09-24 DIAGNOSIS — E7800 Pure hypercholesterolemia, unspecified: Secondary | ICD-10-CM | POA: Insufficient documentation

## 2011-09-24 DIAGNOSIS — S32000A Wedge compression fracture of unspecified lumbar vertebra, initial encounter for closed fracture: Secondary | ICD-10-CM | POA: Diagnosis present

## 2011-09-24 DIAGNOSIS — S32040A Wedge compression fracture of fourth lumbar vertebra, initial encounter for closed fracture: Secondary | ICD-10-CM

## 2011-09-24 DIAGNOSIS — I10 Essential (primary) hypertension: Secondary | ICD-10-CM | POA: Insufficient documentation

## 2011-09-24 DIAGNOSIS — Z7901 Long term (current) use of anticoagulants: Secondary | ICD-10-CM | POA: Insufficient documentation

## 2011-09-24 DIAGNOSIS — S32009A Unspecified fracture of unspecified lumbar vertebra, initial encounter for closed fracture: Secondary | ICD-10-CM | POA: Insufficient documentation

## 2011-09-24 DIAGNOSIS — M810 Age-related osteoporosis without current pathological fracture: Secondary | ICD-10-CM | POA: Insufficient documentation

## 2011-09-24 LAB — CBC
HCT: 39.6 % (ref 36.0–46.0)
RDW: 16 % — ABNORMAL HIGH (ref 11.5–15.5)
WBC: 5.9 10*3/uL (ref 4.0–10.5)

## 2011-09-24 LAB — CREATININE, SERUM
GFR calc Af Amer: 61 mL/min — ABNORMAL LOW (ref >90)
GFR calc non Af Amer: 52 mL/min — ABNORMAL LOW (ref >90)

## 2011-09-24 MED ORDER — ACETAMINOPHEN 325 MG PO TABS
650.0000 mg | ORAL_TABLET | Freq: Four times a day (QID) | ORAL | Status: DC | PRN
  Administered 2011-09-24: 650 mg via ORAL
  Filled 2011-09-24: qty 1

## 2011-09-24 MED ORDER — ACETAMINOPHEN 650 MG RE SUPP: 650.0000 mg | Freq: Four times a day (QID) | RECTAL | Status: DC | PRN

## 2011-09-24 MED ORDER — BRIMONIDINE TARTRATE 0.15 % OP SOLN
1.0000 [drp] | Freq: Two times a day (BID) | OPHTHALMIC | Status: DC
  Administered 2011-09-25 – 2011-09-26 (×3): 1 [drp] via OPHTHALMIC
  Filled 2011-09-24: qty 5

## 2011-09-24 MED ORDER — DORZOLAMIDE HCL-TIMOLOL MAL 2-0.5 % OP SOLN
1.0000 [drp] | Freq: Two times a day (BID) | OPHTHALMIC | Status: DC
  Administered 2011-09-25 – 2011-09-26 (×3): 1 [drp] via OPHTHALMIC
  Filled 2011-09-24: qty 10

## 2011-09-24 MED ORDER — METOPROLOL SUCCINATE ER 50 MG PO TB24
50.0000 mg | ORAL_TABLET | Freq: Every day | ORAL | Status: DC
  Administered 2011-09-24 – 2011-09-26 (×3): 50 mg via ORAL
  Filled 2011-09-24 (×3): qty 1

## 2011-09-24 MED ORDER — POLYETHYLENE GLYCOL 3350 17 G PO PACK
17.0000 g | PACK | Freq: Every day | ORAL | Status: DC
  Administered 2011-09-24 – 2011-09-26 (×3): 17 g via ORAL
  Filled 2011-09-24 (×3): qty 1

## 2011-09-24 MED ORDER — POTASSIUM CHLORIDE CRYS ER 20 MEQ PO TBCR
20.0000 meq | EXTENDED_RELEASE_TABLET | Freq: Two times a day (BID) | ORAL | Status: DC
  Administered 2011-09-24 – 2011-09-26 (×4): 20 meq via ORAL
  Filled 2011-09-24 (×4): qty 1

## 2011-09-24 MED ORDER — LEVOTHYROXINE SODIUM 100 MCG PO TABS
100.0000 ug | ORAL_TABLET | Freq: Every day | ORAL | Status: DC
  Administered 2011-09-24 – 2011-09-26 (×3): 100 ug via ORAL
  Filled 2011-09-24 (×3): qty 1

## 2011-09-24 MED ORDER — HEPARIN SODIUM (PORCINE) 5000 UNIT/ML IJ SOLN
5000.0000 [IU] | Freq: Three times a day (TID) | INTRAMUSCULAR | Status: DC
  Administered 2011-09-24 – 2011-09-26 (×6): 5000 [IU] via SUBCUTANEOUS
  Filled 2011-09-24 (×6): qty 1

## 2011-09-24 MED ORDER — ACETAMINOPHEN 500 MG PO TABS
1000.0000 mg | ORAL_TABLET | Freq: Once | ORAL | Status: AC
  Administered 2011-09-24: 1000 mg via ORAL
  Filled 2011-09-24: qty 2

## 2011-09-24 MED ORDER — GABAPENTIN 300 MG PO CAPS
300.0000 mg | ORAL_CAPSULE | Freq: Three times a day (TID) | ORAL | Status: DC
  Administered 2011-09-24 – 2011-09-26 (×6): 300 mg via ORAL
  Filled 2011-09-24 (×6): qty 1

## 2011-09-24 MED ORDER — HYDROCODONE-ACETAMINOPHEN 5-325 MG PO TABS
1.5000 | ORAL_TABLET | Freq: Four times a day (QID) | ORAL | Status: DC | PRN
  Administered 2011-09-24 – 2011-09-25 (×2): 1.5 via ORAL
  Filled 2011-09-24 (×2): qty 2

## 2011-09-24 MED ORDER — HYDROCODONE-ACETAMINOPHEN 5-325 MG PO TABS
1.0000 | ORAL_TABLET | Freq: Once | ORAL | Status: AC
  Administered 2011-09-24: 1 via ORAL
  Filled 2011-09-24: qty 1

## 2011-09-24 MED ORDER — ZOLPIDEM TARTRATE 5 MG PO TABS: 5.0000 mg | ORAL_TABLET | Freq: Every evening | ORAL | Status: DC | PRN

## 2011-09-24 MED ORDER — WARFARIN SODIUM 1 MG PO TABS
1.0000 mg | ORAL_TABLET | Freq: Every day | ORAL | Status: DC
  Administered 2011-09-24 – 2011-09-25 (×2): 1 mg via ORAL
  Filled 2011-09-24 (×2): qty 1

## 2011-09-24 MED ORDER — MORPHINE SULFATE 4 MG/ML IJ SOLN: 4.0000 mg | Freq: Once | INTRAMUSCULAR | Status: DC

## 2011-09-24 MED ORDER — BIMATOPROST 0.03 % OP SOLN
1.0000 [drp] | Freq: Every day | OPHTHALMIC | Status: DC
  Administered 2011-09-24 – 2011-09-25 (×2): 1 [drp] via OPHTHALMIC
  Filled 2011-09-24: qty 2.5

## 2011-09-24 MED ORDER — AMIODARONE HCL 200 MG PO TABS
200.0000 mg | ORAL_TABLET | Freq: Every day | ORAL | Status: DC
  Administered 2011-09-24 – 2011-09-26 (×3): 200 mg via ORAL
  Filled 2011-09-24 (×3): qty 1

## 2011-09-24 MED ORDER — ONDANSETRON HCL 4 MG PO TABS: 4.0000 mg | ORAL_TABLET | Freq: Three times a day (TID) | ORAL | Status: DC | PRN

## 2011-09-24 MED ORDER — SIMVASTATIN 20 MG PO TABS
20.0000 mg | ORAL_TABLET | Freq: Every day | ORAL | Status: DC
  Administered 2011-09-24 – 2011-09-25 (×2): 20 mg via ORAL
  Filled 2011-09-24 (×2): qty 1

## 2011-09-24 MED ORDER — TRIMETHOPRIM 100 MG PO TABS
100.0000 mg | ORAL_TABLET | Freq: Every day | ORAL | Status: DC
  Administered 2011-09-24 – 2011-09-25 (×2): 100 mg via ORAL
  Filled 2011-09-24 (×4): qty 1

## 2011-09-24 NOTE — ED Notes (Addendum)
Dr. Karilyn Cota here and aware of unsuccessful IV attempts. Ok to leave IV out.

## 2011-09-24 NOTE — ED Notes (Signed)
Patient placed on bedpan. Patient has call bell to call when finished using restroom.

## 2011-09-24 NOTE — ED Provider Notes (Addendum)
This chart was scribed for Gerhard Munch, MD by Williemae Natter. The patient was seen in room APA08/APA08 at 9:52 AM.  CSN: [id number]  Arrival date & time 09/24/11  9147   First MD Initiated Contact with Patient 09/24/11 [phone]      Chief Complaint  Patient presents with  . Fall  . Hip Pain    right  . Back Pain    lower back; right posterior ribs     HPI [name] is a 78 y.o. female who presents to the Emergency Department complaining of a fall yesterday night. Pt fell on her hip on her way to bed. Pt has constant pain in lower right back, right hip and lower rib cage. Pt denies loss of consciousness or significant head injury. No head or neck pain. Pain worsens when Pt denies any chills, vomiting, or diarrhea. Pt has a history of hip replacement. Pt reported that she is always in pain but that it was worse after the fall. Pain is worsened when standing up and moving around.  Past Medical History  Diagnosis Date  . Hypertension   . High cholesterol   . Arthritis   . Hypothyroidism   . Coronary artery disease   . Arrhythmia     pacemaker  . Glaucoma   . Pulmonary embolism 06/04/11    Past Surgical History  Procedure Date  . Total hip arthroplasty     right side  . Pacemaker insertion   . Appendectomy   . Abdominal hysterectomy     No family history on file.  History  Substance Use Topics  . Smoking status: Never Smoker   . Smokeless tobacco: Not on file  . Alcohol Use: No    OB History    Grav Para Term Preterm Abortions TAB SAB Ect Mult Living                  Review of Systems  Constitutional: Negative for fever and chills.  HENT: Negative for rhinorrhea and neck pain.   Eyes: Negative for photophobia.  Respiratory: Negative for chest tightness and shortness of breath.   Cardiovascular: Negative for chest pain.  Gastrointestinal: Negative for nausea and vomiting.  Genitourinary: Positive for pelvic pain.  Musculoskeletal: Positive for back  pain.  Neurological: Negative for light-headedness and headaches.  Psychiatric/Behavioral: Negative for confusion.  All other systems reviewed and are negative.    Allergies  Review of patient's allergies indicates no known allergies.  Home Medications   Current Outpatient Rx  Name Route Sig Dispense Refill  . ALPHAGAN P 0.1 % OP SOLN Both Eyes Place 1 drop into both eyes 2 (two) times daily.     . AMIODARONE HCL 200 MG PO TABS Oral Take 200 mg by mouth daily.     Marland Kitchen CALCIUM CARBONATE-VITAMIN D 500-200 MG-UNIT PO TABS Oral Take 1 tablet by mouth 2 (two) times daily.     . DORZOLAMIDE HCL-TIMOLOL MAL 22.3-6.8 MG/ML OP SOLN Both Eyes Place 1 drop into both eyes 2 (two) times daily.     Marland Kitchen ESTRACE 0.1 MG/GM VA CREA Vaginal Place 2 g vaginally daily as needed. For vagina    . FUROSEMIDE 80 MG PO TABS Oral Take 1 tablet (80 mg total) by mouth 2 (two) times daily. 60 tablet 0  . GABAPENTIN 300 MG PO CAPS Oral Take 1 capsule (300 mg total) by mouth 3 (three) times daily.    Marland Kitchen HYDROCODONE-ACETAMINOPHEN 5-325 MG PO TABS Oral Take 1 tablet  by mouth every 6 (six) hours as needed. Pain    . IPRATROPIUM-ALBUTEROL 0.5-2.5 (3) MG/3ML IN SOLN Nebulization Take 3 mLs by nebulization every 4 (four) hours as needed. Shortness of Breath/Wheezing     . LIDOCAINE 5 % EX PTCH Transdermal Place 1 patch onto the skin daily. Remove & Discard patch within 12 hours or as directed by MD    . LUMIGAN 0.03 % OP SOLN Both Eyes Place 1 drop into both eyes at bedtime.     Marland Kitchen POLYETHYLENE GLYCOL 3350 PO PACK Oral Take 17 g by mouth daily.      Marland Kitchen POTASSIUM CHLORIDE CRYS ER 20 MEQ PO TBCR Oral Take 1 tablet (20 mEq total) by mouth 2 (two) times daily. 60 tablet 0  . PRAVASTATIN SODIUM 20 MG PO TABS Oral Take 20 mg by mouth daily.     Marland Kitchen PREDNISONE 20 MG PO TABS  Take 2 tablets daily for 3 days, then one tablet daily for 3 days, then half a tablet daily for 3 days and then STOP. 12 tablet 0  . PROMETHAZINE HCL 12.5 MG RE SUPP  Rectal Place 12.5 mg rectally every 6 (six) hours as needed. For nausea     . SENNOSIDES-DOCUSATE SODIUM 8.6-50 MG PO TABS Oral Take 1 tablet by mouth daily as needed for constipation. 30 tablet 0  . WARFARIN SODIUM 2 MG PO TABS Oral Take 2 mg by mouth daily.      Cardiac Monitor   Rate: 68 bpm  Rhythm:  Pulse oximetry on room air is 99%. Normal by my interpretation.   BP 161/90  Pulse 68  Temp(Src) 97.9 F (36.6 C) (Oral)  Resp 20  Ht 5' 3.5" (1.613 m)  Wt 141 lb (63.957 kg)  BMI 24.59 kg/m2  SpO2 99%  Physical Exam  Nursing note and vitals reviewed. Constitutional: She is oriented to person, place, and time. She appears well-developed and well-nourished.  HENT:  Head: Normocephalic and atraumatic.  Neck: Normal range of motion. Neck supple.  Cardiovascular: Normal rate, regular rhythm and normal heart sounds.   Pulmonary/Chest: Effort normal and breath sounds normal.  Abdominal: Soft. There is tenderness (diffusely tender).  Musculoskeletal:       3/5 strength on the right leg 5/5 strength on the left leg Tender to palpation  Neurological: She is alert and oriented to person, place, and time.  Skin: Skin is warm and dry.  Psychiatric: She has a normal mood and affect. Her behavior is normal.    ED Course  Procedures (including critical care time) 12:22 PM Recheck: Patient informed of lab and imaging results. Fracture of 10th right rib.   Labs Reviewed - No data to display Dg Ribs Unilateral W/chest Right  09/24/2011  *RADIOLOGY REPORT*  Clinical Data: Post fall, pain  RIGHT RIBS AND CHEST - 3+ VIEW  Comparison: 08/10/2011  Findings: Cardiomegaly again noted.  Atherosclerotic calcifications of the thoracic aorta.  Prior vertebroplasty lower thoracic spine. The study is limited by diffuse osteopenia. .  Dual lead cardiac pacemaker with leads in the right atrium and right ventricle.  No diagnostic pneumothorax.  No acute infiltrate or edema. There is mild displaced fracture  of the right tenth rib.  IMPRESSION: Cardiomegaly.  Dual lead cardiac pacemaker in place. No diagnostic pneumothorax.  Mild displaced fracture of the right tenth rib.  Original Report Authenticated By: Natasha Mead, M.D.   Dg Lumbar Spine 2-3 Views  09/24/2011  *RADIOLOGY REPORT*  Clinical Data: Fall with pain.  LUMBAR SPINE - 2-3 VIEW  Comparison: Lumbar spine CT from 05/14/2011.  Lumbar x-rays from 05/14/2011.  Findings: Two-view exam of the lumbar spine shows marked osteopenia.  The patient is status post vertebral augmentation at T12.  Stable appearance of compression fractures of L2-L3.  There is new compression deformity at L4.  IMPRESSION: Marked diffuse demineralization with numerous thoracolumbar compression fractures.  The compression deformity at L4 is new in the interval.  Original Report Authenticated By: ERIC A. MANSELL, M.D.   Dg Hip Complete Right  09/24/2011  *RADIOLOGY REPORT*  Clinical Data: Pain post fall  RIGHT HIP - COMPLETE 2+ VIEW  Comparison: 04/26/2004  Findings: Three views of the right hip submitted.  No acute fracture or subluxation.  There is a right hip prosthesis in anatomic alignment.  Prosthetic material appears intact.  IMPRESSION: No acute fracture or subluxation.  Right hip prosthesis in anatomic alignment.  Original Report Authenticated By: Natasha Mead, M.D.   Ct Head Wo Contrast  09/24/2011  *RADIOLOGY REPORT*  Clinical Data: Fall with head injury.  CT HEAD WITHOUT CONTRAST  Technique:  Contiguous axial images were obtained from the base of the skull through the vertex without contrast.  Comparison: No comparison studies available.  Findings: There is no evidence for acute hemorrhage, hydrocephalus, mass lesion, or abnormal extra-axial fluid collection.  No definite CT evidence for acute infarction.  Diffuse loss of parenchymal volume is consistent with atrophy. Patchy low attenuation in the deep hemispheric and periventricular white matter is nonspecific, but likely  reflects chronic microvascular ischemic demyelination. The visualized paranasal sinuses and mastoid air cells are clear.  IMPRESSION: No acute intracranial abnormality.  Atrophy with chronic small vessel white matter ischemic disease.  Original Report Authenticated By: ERIC A. MANSELL, M.D.     No diagnosis found.  XR, CT reviewed by me (CT ordered due to the patient's age, evidence of trauma significant to cause multiple fractures and coumadin use)  MDM  I personally performed the services described in this documentation, which was scribed in my presence. The recorded information has been reviewed and considered.  This elderly female presents one day after a mechanical fall at home with persistent right sided pain.  On exam she is in no distress, though she seems uncomfortable.  The patient's labs are reassuring.  The patient's.  Extremities note right 10th rib fracture, and new L4 compression fracture.  The patient was admitted for further evaluation and management as well as pain control        Gerhard Munch, MD 09/25/11 4540  Gerhard Munch, MD 10/31/11 1208

## 2011-09-24 NOTE — ED Notes (Signed)
Pt attempted by 2 nurses for IV access. Both attempts were unsuccessful. Pt voiced she would take something for pain by mouth. Will let MD know.

## 2011-09-24 NOTE — ED Notes (Signed)
Food given to pt per Dr. Karilyn Cota

## 2011-09-24 NOTE — ED Notes (Signed)
C/o lower back pain, right posterior rib pain, and right hip pain s/p fall yesterday.  Denies loc.

## 2011-09-24 NOTE — ED Notes (Signed)
Patient transported to CT 

## 2011-09-24 NOTE — H&P (Signed)
[name] MRN: [id number] DOB/AGE: [birth year] 78 y.o. Primary Care Physician:FUSCO,LAWRENCE J., MD, MD Admit date: 09/24/2011 Chief Complaint: Fall with pain in the right side of her chest and lower back. HPI: This very pleasant 78 year old lady had an accidental fall at home sustaining pain in the right chest and lower back. She came to the emergency room and evaluation showed a right 10th rib fracture as well as a likely new compression fracture of L4. The patient has significant osteoporosis. She was recently in a nursing home at the [facility] for approximately 3 months and discharged finally just after Christmas last year. She's been doing well until the accidental fall today. There is no history of head injury or loss of consciousness.  Past Medical History  Diagnosis Date  . Hypertension   . High cholesterol   . Arthritis   . Hypothyroidism   . Coronary artery disease   . Arrhythmia     pacemaker  . Glaucoma   . Pulmonary embolism 06/04/11   Past Surgical History  Procedure Date  . Total hip arthroplasty     right side  . Pacemaker insertion   . Appendectomy   . Abdominal hysterectomy         No family history on file. Family history: Noncontributory.  Social history: She is married and lives with her husband. She does not smoke and does not drink alcohol.  Allergies: No Known Allergies  Medications Prior to Admission  Medication Dose Route Frequency Provider Last Rate Last Dose  . acetaminophen (TYLENOL) tablet 1,000 mg  1,000 mg Oral Once Gerhard Munch, MD   1,000 mg at 09/24/11 1030  . HYDROcodone-acetaminophen (NORCO) 5-325 MG per tablet 1 tablet  1 tablet Oral Once Gerhard Munch, MD   1 tablet at 09/24/11 1346  . DISCONTD: morphine 4 MG/ML injection 4 mg  4 mg Intravenous Once Gerhard Munch, MD       Medications Prior to Admission  Medication Sig Dispense Refill  . amiodarone (PACERONE) 200 MG tablet Take 200 mg by mouth daily.       .  dorzolamide-timolol (COSOPT) 22.3-6.8 MG/ML ophthalmic solution Place 1 drop into both eyes 2 (two) times daily.       Marland Kitchen LUMIGAN 0.03 % ophthalmic solution Place 1 drop into both eyes at bedtime.       . polyethylene glycol (MIRALAX / GLYCOLAX) packet Take 17 g by mouth daily.        . potassium chloride SA (K-DUR,KLOR-CON) 20 MEQ tablet Take 1 tablet (20 mEq total) by mouth 2 (two) times daily.  60 tablet  0  . pravastatin (PRAVACHOL) 20 MG tablet Take 20 mg by mouth daily.       Marland Kitchen warfarin (COUMADIN) 2 MG tablet Take 1 mg by mouth every evening.            WUJ:WJXBJ from the symptoms mentioned above,there are no other symptoms referable to all systems reviewed.  Physical Exam: Blood pressure 161/90, pulse 68, temperature 97.9 F (36.6 C), temperature source Oral, resp. rate 20, height 5' 3.5" (1.613 m), weight 63.957 kg (141 lb), SpO2 99.00%. She looks systemically well, she is not in any acute distress. She does not have any dyspnea at rest. Heart sounds are present and normal without murmurs, jugular venous pressure not raised. Lung fields are clinically clear. Abdomen is soft and nontender. She is alert and orientated without any focal neurological signs. 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  Dg Ribs Unilateral W/chest Right  09/24/2011  *RADIOLOGY REPORT*  Clinical Data: Post fall, pain  RIGHT RIBS AND CHEST - 3+ VIEW  Comparison: 08/10/2011  Findings: Cardiomegaly again noted.  Atherosclerotic calcifications of the thoracic aorta.  Prior vertebroplasty lower thoracic spine. The study is limited by diffuse osteopenia. .  Dual lead cardiac pacemaker with leads in the right atrium and right ventricle.  No diagnostic pneumothorax.  No acute infiltrate or edema. There is mild displaced fracture of the right tenth rib.  IMPRESSION: Cardiomegaly.  Dual lead cardiac pacemaker in place. No diagnostic pneumothorax.  Mild displaced fracture of the right tenth rib.   Original Report Authenticated By: Natasha Mead, M.D.   Dg Lumbar Spine 2-3 Views  09/24/2011  *RADIOLOGY REPORT*  Clinical Data: Fall with pain.  LUMBAR SPINE - 2-3 VIEW  Comparison: Lumbar spine CT from 05/14/2011.  Lumbar x-rays from 05/14/2011.  Findings: Two-view exam of the lumbar spine shows marked osteopenia.  The patient is status post vertebral augmentation at T12.  Stable appearance of compression fractures of L2-L3.  There is new compression deformity at L4.  IMPRESSION: Marked diffuse demineralization with numerous thoracolumbar compression fractures.  The compression deformity at L4 is new in the interval.  Original Report Authenticated By: ERIC A. MANSELL, M.D.   Dg Hip Complete Right  09/24/2011  *RADIOLOGY REPORT*  Clinical Data: Pain post fall  RIGHT HIP - COMPLETE 2+ VIEW  Comparison: 04/26/2004  Findings: Three views of the right hip submitted.  No acute fracture or subluxation.  There is a right hip prosthesis in anatomic alignment.  Prosthetic material appears intact.  IMPRESSION: No acute fracture or subluxation.  Right hip prosthesis in anatomic alignment.  Original Report Authenticated By: Natasha Mead, M.D.   Ct Head Wo Contrast  09/24/2011  *RADIOLOGY REPORT*  Clinical Data: Fall with head injury.  CT HEAD WITHOUT CONTRAST  Technique:  Contiguous axial images were obtained from the base of the skull through the vertex without contrast.  Comparison: No comparison studies available.  Findings: There is no evidence for acute hemorrhage, hydrocephalus, mass lesion, or abnormal extra-axial fluid collection.  No definite CT evidence for acute infarction.  Diffuse loss of parenchymal volume is consistent with atrophy. Patchy low attenuation in the deep hemispheric and periventricular white matter is nonspecific, but likely reflects chronic microvascular ischemic demyelination. The visualized paranasal sinuses and mastoid air cells are clear.  IMPRESSION: No acute intracranial abnormality.   Atrophy with chronic small vessel white matter ischemic disease.  Original Report Authenticated By: ERIC A. MANSELL, M.D.   Impression: 1. Fracture of the rib right side. 2. L4 compression fracture, new. 3. Osteoporosis. 4. Sick sinus syndrome, status post pacemaker. 5. Hypertension.     Plan: 1. Admit to general medical floor. 2. Analgesia. 3. Physical therapy. 4. Disposition-she may well require rehabilitation again at the [facility]. We will get recommendations from physical therapy. The patient certainly wishes to go to the [facility].      Wilson Singer Pager [phone]  09/24/2011, 2:04 PM

## 2011-09-25 LAB — COMPREHENSIVE METABOLIC PANEL
ALT: 11 U/L (ref 0–35)
BUN: 16 mg/dL (ref 6–23)
Calcium: 10.2 mg/dL (ref 8.4–10.5)
GFR calc Af Amer: 47 mL/min — ABNORMAL LOW (ref >90)
Glucose, Bld: 90 mg/dL (ref 70–99)
Sodium: 137 mEq/L (ref 135–145)
Total Protein: 6.3 g/dL (ref 6.0–8.3)

## 2011-09-25 LAB — CBC
HCT: 40.9 % (ref 36.0–46.0)
Hemoglobin: 13 g/dL (ref 12.0–15.0)
RBC: 4.91 MIL/uL (ref 3.87–5.11)
WBC: 5.3 10*3/uL (ref 4.0–10.5)

## 2011-09-25 LAB — PROTIME-INR: INR: 1.39 (ref 0.00–1.49)

## 2011-09-25 MED ORDER — ACETAMINOPHEN 500 MG PO TABS
1000.0000 mg | ORAL_TABLET | Freq: Three times a day (TID) | ORAL | Status: DC
  Administered 2011-09-25 (×2): 1000 mg via ORAL
  Filled 2011-09-25 (×2): qty 2

## 2011-09-25 MED ORDER — OXYCODONE HCL 5 MG PO TABS
10.0000 mg | ORAL_TABLET | Freq: Four times a day (QID) | ORAL | Status: DC | PRN
  Administered 2011-09-25 – 2011-09-26 (×2): 10 mg via ORAL
  Filled 2011-09-25 (×2): qty 2

## 2011-09-25 NOTE — Progress Notes (Signed)
09/25/2011  Patient:  [name],[name]   Account Number:  [id number]  Date Initiated:  09/25/2011  Documentation initiated by:  Andi Devon Assessment:   78 yr old female from home with fall rib fractures and compression fx. lives with spouse recently was d/c from [facility]     Action/Plan:   Anticipated DC Date:  09/26/2011   Anticipated DC Plan:  HOME/SELF CARE      DC Planning Services  CM consult      Choice offered to / List presented to:             Status of service:   Medicare Important Message given?   (If response is "NO", the following Medicare IM given date fields will be blank) Date Medicare IM given:   Date Additional Medicare IM given:    Discharge Disposition:    Per UR Regulation:    Comments:  09/25/2011 Mendel Corning rn [facility]  spoke to pt concerning d/c plans she told me to talk to her husband. spoke to mr [facility] by phone explained to him that Pt is recommending snf or al. pt has used all of her medicare days up. Mr Brum states he feel he can manage her at home with help of his two stepsons. discuss home health referral, he states he can give her therapy and feels she will not need it. he states pt has all dme needed. explain to mr [place] that pt is observation status he said md al already told him about it and he will be ready to take pt home tomorrow.

## 2011-09-25 NOTE — Progress Notes (Signed)
UR Chart Review Completed  

## 2011-09-25 NOTE — Progress Notes (Signed)
Subjective: Patient still having very significant pain, taking short shallow breaths, appears uncomfortable  Objective: Vital signs in last 24 hours: Temp:  [97.5 F (36.4 C)-98.2 F (36.8 C)] 98.2 F (36.8 C) (01/22 0558) Pulse Rate:  [60-97] 97  (01/22 0558) Resp:  [18] 18  (01/22 0558) BP: (107-149)/(44-72) 132/72 mmHg (01/22 0558) SpO2:  [93 %-96 %] 93 % (01/22 0558) Weight change:  Last BM Date: 09/23/11  Intake/Output from previous day: 01/21 0701 - 01/22 0700 In: 100 [P.O.:100] Out: 100 [Urine:100]     Physical Exam: General: Alert, awake, oriented x3, in no acute distress. HEENT: No bruits, no goiter. Heart: Regular rate and rhythm, without murmurs, rubs, gallops. Lungs: Clear to auscultation bilaterally. Abdomen: Soft, nontender, nondistended, positive bowel sounds. Extremities: No clubbing cyanosis or edema with positive pedal pulses. Neuro: Grossly intact, nonfocal.    Lab Results: Basic Metabolic Panel:  Basename 09/25/11 0625 09/24/11 1647  NA 137 --  K 4.1 --  CL 104 --  CO2 26 --  GLUCOSE 90 --  BUN 16 --  CREATININE 1.20* 0.98  CALCIUM 10.2 --  MG -- --  PHOS -- --   Liver Function Tests:  [facility] 09/25/11 0625  AST 17  ALT 11  ALKPHOS 66  BILITOT 0.3  PROT 6.3  ALBUMIN 3.0*   No results found for this basename: LIPASE:2,AMYLASE:2 in the last 72 hours No results found for this basename: AMMONIA:2 in the last 72 hours CBC:  Basename 09/25/11 0625 09/24/11 1647  WBC 5.3 5.9  NEUTROABS -- --  HGB 13.0 13.0  HCT 40.9 39.6  MCV 83.3 81.8  PLT 234 266   Cardiac Enzymes: No results found for this basename: CKTOTAL:3,CKMB:3,CKMBINDEX:3,TROPONINI:3 in the last 72 hours BNP: No results found for this basename: PROBNP:3 in the last 72 hours D-Dimer: No results found for this basename: DDIMER:2 in the last 72 hours CBG: No results found for this basename: GLUCAP:6 in the last 72 hours Hemoglobin A1C: No results found for this  basename: HGBA1C in the last 72 hours Fasting Lipid Panel: No results found for this basename: CHOL,HDL,LDLCALC,TRIG,CHOLHDL,LDLDIRECT in the last 72 hours Thyroid Function Tests: No results found for this basename: TSH,T4TOTAL,FREET4,T3FREE,THYROIDAB in the last 72 hours Anemia Panel: No results found for this basename: VITAMINB12,FOLATE,FERRITIN,TIBC,IRON,RETICCTPCT in the last 72 hours Coagulation:  Basename 09/25/11 0625 09/24/11 1647  LABPROT 17.3* 16.8*  INR 1.39 1.34   Urine Drug Screen: Drugs of Abuse  No results found for this basename: labopia, cocainscrnur, labbenz, amphetmu, thcu, labbarb    Alcohol Level: No results found for this basename: ETH:2 in the last 72 hours  No results found for this or any previous visit (from the past 240 hour(s)).  Studies/Results: Dg Ribs Unilateral W/chest Right  09/24/2011  *RADIOLOGY REPORT*  Clinical Data: Post fall, pain  RIGHT RIBS AND CHEST - 3+ VIEW  Comparison: 08/10/2011  Findings: Cardiomegaly again noted.  Atherosclerotic calcifications of the thoracic aorta.  Prior vertebroplasty lower thoracic spine. The study is limited by diffuse osteopenia. .  Dual lead cardiac pacemaker with leads in the right atrium and right ventricle.  No diagnostic pneumothorax.  No acute infiltrate or edema. There is mild displaced fracture of the right tenth rib.  IMPRESSION: Cardiomegaly.  Dual lead cardiac pacemaker in place. No diagnostic pneumothorax.  Mild displaced fracture of the right tenth rib.  Original Report Authenticated By: Natasha Mead, M.D.   Dg Lumbar Spine 2-3 Views  09/24/2011  *RADIOLOGY REPORT*  Clinical Data: Fall with  pain.  LUMBAR SPINE - 2-3 VIEW  Comparison: Lumbar spine CT from 05/14/2011.  Lumbar x-rays from 05/14/2011.  Findings: Two-view exam of the lumbar spine shows marked osteopenia.  The patient is status post vertebral augmentation at T12.  Stable appearance of compression fractures of L2-L3.  There is new compression  deformity at L4.  IMPRESSION: Marked diffuse demineralization with numerous thoracolumbar compression fractures.  The compression deformity at L4 is new in the interval.  Original Report Authenticated By: ERIC A. MANSELL, M.D.   Dg Hip Complete Right  09/24/2011  *RADIOLOGY REPORT*  Clinical Data: Pain post fall  RIGHT HIP - COMPLETE 2+ VIEW  Comparison: 04/26/2004  Findings: Three views of the right hip submitted.  No acute fracture or subluxation.  There is a right hip prosthesis in anatomic alignment.  Prosthetic material appears intact.  IMPRESSION: No acute fracture or subluxation.  Right hip prosthesis in anatomic alignment.  Original Report Authenticated By: Natasha Mead, M.D.   Ct Head Wo Contrast  09/24/2011  *RADIOLOGY REPORT*  Clinical Data: Fall with head injury.  CT HEAD WITHOUT CONTRAST  Technique:  Contiguous axial images were obtained from the base of the skull through the vertex without contrast.  Comparison: No comparison studies available.  Findings: There is no evidence for acute hemorrhage, hydrocephalus, mass lesion, or abnormal extra-axial fluid collection.  No definite CT evidence for acute infarction.  Diffuse loss of parenchymal volume is consistent with atrophy. Patchy low attenuation in the deep hemispheric and periventricular white matter is nonspecific, but likely reflects chronic microvascular ischemic demyelination. The visualized paranasal sinuses and mastoid air cells are clear.  IMPRESSION: No acute intracranial abnormality.  Atrophy with chronic small vessel white matter ischemic disease.  Original Report Authenticated By: ERIC A. MANSELL, M.D.    Medications: Scheduled Meds:   . amiodarone  200 mg Oral Daily  . bimatoprost  1 drop Both Eyes QHS  . brimonidine  1 drop Both Eyes BID  . dorzolamide-timolol  1 drop Both Eyes BID  . gabapentin  300 mg Oral TID  . heparin  5,000 Units Subcutaneous Q8H  . HYDROcodone-acetaminophen  1 tablet Oral Once  . levothyroxine   100 mcg Oral Daily  . metoprolol succinate  50 mg Oral Daily  . polyethylene glycol  17 g Oral Daily  . potassium chloride SA  20 mEq Oral BID  . simvastatin  20 mg Oral q1800  . trimethoprim  100 mg Oral QHS  . warfarin  1 mg Oral q1800  . DISCONTD: morphine  4 mg Intravenous Once   Continuous Infusions:  PRN Meds:.acetaminophen, acetaminophen, HYDROcodone-acetaminophen, ondansetron, zolpidem  Assessment/Plan:  1. Fracture of the rib right side.  2. L4 compression fracture, new.  3. Osteoporosis.  4. Sick sinus syndrome, status post pacemaker.  5. Hypertension  Plan:  Patient will be changed to observation status PT eval is pending Will adjust pain regimen Continue cardiac meds  Likely for discharge home tomorrow.   LOS: 1 day   Auna Mikkelsen [facility] 09/25/2011, 12:42 PM

## 2011-09-25 NOTE — Evaluation (Signed)
Physical Therapy Evaluation Patient Details Name: [name] MRN: [id number] DOB: Feb 18, 1927 Today's Date: 09/25/2011  Problem List:  Patient Active Problem List  Diagnoses  . Chronic pain syndrome  . ARTHRITIS, LUMBOSACRAL SPINE  . Sciatica  . PAF (paroxysmal atrial fibrillation)  . Sick sinus syndrome  . HTN (hypertension)  . Hypothyroidism  . Osteoporosis  . Closed left ankle fracture  . Near syncope  . CHF exacerbation  . Spinal stenosis of lumbar region  . Fracture of rib of right side  . Lumbar compression fracture  . Glaucoma    Past Medical History:  Past Medical History  Diagnosis Date  . Hypertension   . High cholesterol   . Arthritis   . Hypothyroidism   . Coronary artery disease   . Arrhythmia     pacemaker  . Glaucoma   . Pulmonary embolism 06/04/11   Past Surgical History:  Past Surgical History  Procedure Date  . Total hip arthroplasty     right side  . Pacemaker insertion   . Appendectomy   . Abdominal hysterectomy     PT Assessment/Plan/Recommendation PT Assessment Clinical Impression Statement: pt's mobility is severely limited by recent compression fx and R rib fxs...she is alert and very cooperative with no pain at rest...min to [facility] assist needed to transfer in/out of bed...gait is severely labored and unstable, only being able to ambulate 15' with walker...a CASH brace is usually ordered by Orthopedists for pts who have spinal compression fx...have asked pt's RN to check with MD to see if he wants one fitted.Marland KitchenMarland KitchenI think it would be very difficult for pt's husband to manage her at home...since she doesn't have funding for SNF, she would be appropriate for ACLF with HHPT PT Recommendation/Assessment: Patient will need skilled PT in the acute care venue PT Problem List: Decreased strength;Decreased activity tolerance;Decreased balance;Decreased mobility;Pain Barriers to Discharge: None PT Therapy Diagnosis : Difficulty walking;Abnormality of  gait;Generalized weakness;Acute pain PT Plan PT Frequency: Min 5X/week PT Treatment/Interventions: DME instruction;Gait training;Therapeutic activities;Functional mobility training;Therapeutic exercise;Balance training;Patient/family education PT Recommendation Follow Up Recommendations: Home health PT;Supervision/Assistance - 24 hour Equipment Recommended: Defer to next venue PT Goals  Acute Rehab PT Goals PT Goal Formulation: With patient Time For Goal Achievement: 2 weeks Pt will Roll Supine to Right Side: with supervision Pt will go Supine/Side to Sit: with min assist Pt will go Sit to Supine/Side: with min assist Pt will go Sit to Stand: with min assist Pt will go Stand to Sit: with supervision Pt will Ambulate: 16 - 50 feet;with mod assist;with rolling walker  PT Evaluation Precautions/Restrictions  Precautions Precautions: Fall Required Braces or Orthoses: No (CASH brace often ordered-have asked RN to check with MD) Restrictions Weight Bearing Restrictions: No Prior Functioning  Home Living Lives With: Spouse Receives Help From: Family Type of Home: House Home Layout: One level Home Adaptive Equipment: Walker - rolling;Bedside commode/3-in-1 Prior Function Level of Independence: Needs assistance with ADLs;Needs assistance with homemaking;Independent with gait;Requires assistive device for independence;Independent with transfers Driving: No Vocation: Retired Producer, television/film/video: Awake/alert Overall Cognitive Status: Appears within functional limits for tasks assessed Sensation/Coordination Sensation Light Touch: Appears Intact Stereognosis: Not tested Hot/Cold: Not tested Proprioception: Not tested Coordination Gross Motor Movements are Fluid and Coordinated: Yes Extremity Assessment RUE Assessment RUE Assessment: Within Functional Limits LUE Assessment LUE Assessment: Within Functional Limits RLE Assessment RLE Assessment: Within  Functional Limits LLE Assessment LLE Assessment: Within Functional Limits Mobility (including Balance) Bed Mobility Bed Mobility: Yes Rolling Right: 4:  Min assist Rolling Right Details (indicate cue type and reason): cues to use LLE to push pelvis forward Right Sidelying to Sit: 3: Mod assist Right Sidelying to Sit Details (indicate cue type and reason): not enough strength in UEs to push up to sitting Sit to Sidelying Left: 3: Mod assist Sit to Sidelying Left Details (indicate cue type and reason): cues for correct technique Transfers Transfers: Yes Sit to Stand: 4: Min assist Sit to Stand Details (indicate cue type and reason): tends to fall backward...needs assist to move weight anteriorly Stand to Sit: 4: Min assist Stand to Sit Details: to help control descent Ambulation/Gait Ambulation/Gait: Yes Ambulation/Gait Assistance: 2: Max assist Ambulation/Gait Assistance Details (indicate cue type and reason): wide based gait with inability to fully extend knees in stance phase of gait Ambulation Distance (Feet): 15 Feet Assistive device: Rolling walker Gait Pattern: Step-through pattern;Decreased stride length;Trunk flexed;Right flexed knee in stance;Left flexed knee in stance;Decreased trunk rotation Gait velocity: extremely slow and labored Stairs: No Wheelchair Mobility Wheelchair Mobility: No  Posture/Postural Control Posture/Postural Control: Postural limitations Postural Limitations: difficult to assess, but may have a thoracic scoliosis as well as kyphosis Balance Balance Assessed: Yes Static Sitting Balance Static Sitting - Level of Assistance: 6: Modified independent (Device/Increase time) Dynamic Sitting Balance Dynamic Sitting - Balance Support: Bilateral upper extremity supported Dynamic Sitting - Level of Assistance: 6: Modified independent (Device/Increase time) Exercise  General Exercises - Lower Extremity Heel Slides: AAROM;10 reps;Both;Supine Hip  ABduction/ADduction: AAROM;Both;10 reps;Supine End of Session PT - End of Session Equipment Utilized During Treatment: Gait belt Activity Tolerance: Patient tolerated treatment well;Patient limited by fatigue;Patient limited by pain Patient left: in bed;with call bell in reach;with bed alarm set;with family/visitor present Nurse Communication: Mobility status for transfers;Mobility status for ambulation General Behavior During Session: [facility] for tasks performed Cognition: [facility] for tasks performed  Konrad Penta 09/25/2011, 3:58 PM

## 2011-09-26 LAB — PROTIME-INR
INR: 1.5 — ABNORMAL HIGH (ref 0.00–1.49)
Prothrombin Time: 18.4 seconds — ABNORMAL HIGH (ref 11.6–15.2)

## 2011-09-26 MED ORDER — CALCIUM CARBONATE-VITAMIN D 500-200 MG-UNIT PO TABS: 1.0000 | ORAL_TABLET | Freq: Two times a day (BID) | ORAL | Status: DC

## 2011-09-26 MED ORDER — FUROSEMIDE 80 MG PO TABS: 80.0000 mg | ORAL_TABLET | Freq: Two times a day (BID) | ORAL | Status: DC

## 2011-09-26 MED ORDER — ACETAMINOPHEN 500 MG PO TABS: 1000.0000 mg | ORAL_TABLET | Freq: Three times a day (TID) | ORAL | Status: AC

## 2011-09-26 MED ORDER — GABAPENTIN 300 MG PO CAPS: 300.0000 mg | ORAL_CAPSULE | Freq: Three times a day (TID) | ORAL | Status: DC

## 2011-09-26 MED ORDER — LIDOCAINE 5 % EX PTCH: 1.0000 | MEDICATED_PATCH | CUTANEOUS | Status: DC

## 2011-09-26 MED ORDER — OXYCODONE HCL 10 MG PO TABS: 10.0000 mg | ORAL_TABLET | Freq: Three times a day (TID) | ORAL | Status: AC | PRN

## 2011-09-26 NOTE — Discharge Summary (Signed)
Physician Discharge Summary  Patient ID: [name] MRN: [id number] DOB/AGE: May 11, 1927 78 y.o.  Admit date: 09/24/2011 Discharge date: 09/26/2011  Primary Care Physician:  Cassell Smiles., MD, MD   Discharge Diagnoses:    Principal Problem:  *Fracture of rib of right side Active Problems:  PAF (paroxysmal atrial fibrillation)  Sick sinus syndrome  HTN (hypertension)  Osteoporosis  Lumbar compression fracture  Glaucoma    Medication List  As of 09/26/2011 12:02 PM   STOP taking these medications         ALPHAGAN P 0.1 % Soln      ESTRACE VAGINAL 0.1 MG/GM vaginal cream      HYDROcodone-acetaminophen 5-325 MG per tablet      HYDROcodone-acetaminophen 7.5-325 MG per tablet      ipratropium-albuterol 0.5-2.5 (3) MG/3ML Soln      predniSONE 20 MG tablet      promethazine 12.5 MG suppository      senna-docusate 8.6-50 MG per tablet         TAKE these medications         acetaminophen 500 MG tablet   Commonly known as: TYLENOL   Take 2 tablets (1,000 mg total) by mouth 3 (three) times daily.      amiodarone 200 MG tablet   Commonly known as: PACERONE   Take 200 mg by mouth daily.      brimonidine 0.1 % Soln   Commonly known as: ALPHAGAN P   Place 1 drop into both eyes 2 (two) times daily.      calcium-vitamin D 500-200 MG-UNIT per tablet   Commonly known as: OSCAL WITH D   Take 1 tablet by mouth 2 (two) times daily.      dorzolamide-timolol 22.3-6.8 MG/ML ophthalmic solution   Commonly known as: COSOPT   Place 1 drop into both eyes 2 (two) times daily.      furosemide 80 MG tablet   Commonly known as: LASIX   Take 1 tablet (80 mg total) by mouth 2 (two) times daily.      gabapentin 300 MG capsule   Commonly known as: NEURONTIN   Take 1 capsule (300 mg total) by mouth 3 (three) times daily.      levothyroxine 100 MCG tablet   Commonly known as: SYNTHROID, LEVOTHROID   Take 100 mcg by mouth daily.      lidocaine 5 %   Commonly known as:  LIDODERM   Place 1 patch onto the skin daily. Remove & Discard patch within 12 hours or as directed by MD      LUMIGAN 0.03 % ophthalmic solution   Generic drug: bimatoprost   Place 1 drop into both eyes at bedtime.      metoprolol succinate 50 MG 24 hr tablet   Commonly known as: TOPROL-XL   Take 50 mg by mouth daily. Take with or immediately following a meal.      ondansetron 4 MG tablet   Commonly known as: ZOFRAN   Take 4 mg by mouth every 8 (eight) hours as needed. For nausea/vomiting      Oxycodone HCl 10 MG Tabs   Take 1 tablet (10 mg total) by mouth every 8 (eight) hours as needed for pain.      polyethylene glycol packet   Commonly known as: MIRALAX / GLYCOLAX   Take 17 g by mouth daily.      potassium chloride SA 20 MEQ tablet   Commonly known as: K-DUR,KLOR-CON   Take 1 tablet (  20 mEq total) by mouth 2 (two) times daily.      pravastatin 20 MG tablet   Commonly known as: PRAVACHOL   Take 20 mg by mouth daily.      promethazine 12.5 MG tablet   Commonly known as: PHENERGAN   Take 12.5 mg by mouth every 6 (six) hours as needed. For nausea      trimethoprim 100 MG tablet   Commonly known as: TRIMPEX   Take 100 mg by mouth at bedtime.      warfarin 2 MG tablet   Commonly known as: COUMADIN   Take 1 mg by mouth every evening.           Discharge Exam: Blood pressure 120/67, pulse 60, temperature 97.9 F (36.6 C), temperature source Oral, resp. rate 18, height 5' 3.5" (1.613 m), weight 63.957 kg (141 lb), SpO2 96.00%. NAD CTA B S1, S2 RRR Soft, NT, BS+ No edema b/l  Disposition and Follow-up:  Follow up with primary doctor in 1-2 weeks. Has been set up with home health RN to do INR checks with results sent to [facility]  Consults: none   Significant Diagnostic Studies:  Dg Ribs Unilateral W/chest Right  09/24/2011  *RADIOLOGY REPORT*  Clinical Data: Post fall, pain  RIGHT RIBS AND CHEST - 3+ VIEW  Comparison: 08/10/2011  Findings: Cardiomegaly again  noted.  Atherosclerotic calcifications of the thoracic aorta.  Prior vertebroplasty lower thoracic spine. The study is limited by diffuse osteopenia. .  Dual lead cardiac pacemaker with leads in the right atrium and right ventricle.  No diagnostic pneumothorax.  No acute infiltrate or edema. There is mild displaced fracture of the right tenth rib.  IMPRESSION: Cardiomegaly.  Dual lead cardiac pacemaker in place. No diagnostic pneumothorax.  Mild displaced fracture of the right tenth rib.  Original Report Authenticated By: Natasha Mead, M.D.   Dg Lumbar Spine 2-3 Views  09/24/2011  *RADIOLOGY REPORT*  Clinical Data: Fall with pain.  LUMBAR SPINE - 2-3 VIEW  Comparison: Lumbar spine CT from 05/14/2011.  Lumbar x-rays from 05/14/2011.  Findings: Two-view exam of the lumbar spine shows marked osteopenia.  The patient is status post vertebral augmentation at T12.  Stable appearance of compression fractures of L2-L3.  There is new compression deformity at L4.  IMPRESSION: Marked diffuse demineralization with numerous thoracolumbar compression fractures.  The compression deformity at L4 is new in the interval.  Original Report Authenticated By: ERIC A. MANSELL, M.D.   Dg Hip Complete Right  09/24/2011  *RADIOLOGY REPORT*  Clinical Data: Pain post fall  RIGHT HIP - COMPLETE 2+ VIEW  Comparison: 04/26/2004  Findings: Three views of the right hip submitted.  No acute fracture or subluxation.  There is a right hip prosthesis in anatomic alignment.  Prosthetic material appears intact.  IMPRESSION: No acute fracture or subluxation.  Right hip prosthesis in anatomic alignment.  Original Report Authenticated By: Natasha Mead, M.D.   Ct Head Wo Contrast  09/24/2011  *RADIOLOGY REPORT*  Clinical Data: Fall with head injury.  CT HEAD WITHOUT CONTRAST  Technique:  Contiguous axial images were obtained from the base of the skull through the vertex without contrast.  Comparison: No comparison studies available.  Findings: There is no  evidence for acute hemorrhage, hydrocephalus, mass lesion, or abnormal extra-axial fluid collection.  No definite CT evidence for acute infarction.  Diffuse loss of parenchymal volume is consistent with atrophy. Patchy low attenuation in the deep hemispheric and periventricular white matter is nonspecific, but likely  reflects chronic microvascular ischemic demyelination. The visualized paranasal sinuses and mastoid air cells are clear.  IMPRESSION: No acute intracranial abnormality.  Atrophy with chronic small vessel white matter ischemic disease.  Original Report Authenticated By: ERIC A. MANSELL, M.D.    Brief H and P: For complete details please refer to admission H and P, but in brief This very pleasant 78 year old lady had an accidental fall at home sustaining pain in the right chest and lower back. She came to the emergency room and evaluation showed a right 10th rib fracture as well as a likely new compression fracture of L4. The patient has significant osteoporosis. She was recently in a nursing home at the [facility] for approximately 3 months and discharged finally just after Christmas last year. She's been doing well until the accidental fall today. There is no history of head injury or loss of consciousness.     Hospital Course:  This is a 78 y/o female who lives at home with her husband.  She had a mechanical fall at home and suffered a compression fracture of her L4 vertebra as well as her right 10th rib.  Patient was admitted to the hospital for further evaluation.  She did require some pain control and was seen by physical therapy.  Her husband feels that he will be able to manage her care at home.  She was recently in the hospital and was sent to a nursing home for rehab.  She had just recently gotten home a few days before christmas.  He reports that they have all the DME that they need at home.  We will set up home health nurse to draw her INR levels. The patient's husband has refused  home physical therapy at this time, but is willing to consider this in the future.  He reports that if the patient does not do well at home, he will ask the primary doctor to assist in nursing home placement. Patient appears relatively comfortable at this time.  We will plan on discharge home today.  Time spent on Discharge:  Signed: Laquincy [name] [facility] 09/26/2011, 12:02 PM

## 2011-09-26 NOTE — Progress Notes (Signed)
CARE MANAGEMENT NOTE 09/26/2011  Patient:  [name], [name]   Account Number:  [id number]  Date Initiated:  09/25/2011  Documentation initiated by:  Andi Devon Assessment:   78 yr old female from home with fall rib fractures and compression fx. lives with spouse recently was d/c from snf     Action/Plan:   Anticipated DC Date:  09/26/2011   Anticipated DC Plan:  HOME/SELF CARE      DC Planning Services  CM consult      [facility] Choice  HOME HEALTH   Choice offered to / List presented to:  C-1 Patient        HH arranged  HH-1 RN      [facility] agency  [facility]   Status of service:   Medicare Important Message given?   (If response is "NO", the following Medicare IM given date fields will be blank) Date Medicare IM given:   Date Additional Medicare IM given:    Discharge Disposition:  HOME W HOME HEALTH SERVICES  Per UR Regulation:    Comments:  09/26/2011 Mendel Corning rn bsn pt d/c home. discussed d/c needs with pt and husband. pt has a w/c and all dme needed at home per husband. request home health rn for inr draws. discussed with md arranged above hh services for  inr draws. husband and pt still refuse home health PT. notified linda lothian rn of [facility] of d/c with orders.  09/25/2011 Mendel Corning rn bsn  spoke to pt concerning d/c plans she told me to talk to her husband. spoke to mr [facility] by phone explained to him that Pt is recommending snf or al. pt has used all of her medicare days up. Mr Montella states he feel he can manage her at home with help of his two stepsons. discuss home health referral, he states he can give her therapy and feels she will not need it. he states pt has all dme needed. explain to mr gunnels that pt is observation status he said md al already told him about it and he will be ready to take pt home tomorrow.

## 2011-09-26 NOTE — Progress Notes (Signed)
Patient discharged to home. Patient and husband given verbal and written discharge instructions, as well as prescriptions. Vital signs stable at discharge.

## 2011-09-26 NOTE — Progress Notes (Signed)
Physical Therapy Treatment Patient Details Name: [name] MRN: [id number] DOB: 12-01-1926 Today's Date: 09/26/2011 Time in/out:  10:20-10:55am Charges:  Gait 15', theract 15  PT Assessment/Plan  PT - Assessment/Plan Comments on Treatment Session: Pt. overall with decreased safety awareness and weakness with mobility.  Unsure if spouse will be able to manage pt at home without assistance and voiced this concern to him.  Spouse states he will look into assistance if needed.  Pt. states she has all needed DME's at home.  Pt. will need continued therapy to increase stength, functional mobility and safety.  Follow Up Recommendations: Home health PT;Supervision/Assistance - 24 hour  PT Treatment Precautions/Restrictions  Precautions Precautions: Fall Required Braces or Orthoses: No (CASH brace often ordered-have asked RN to check with MD) Restrictions Weight Bearing Restrictions: No  Mobility (including Balance) Bed Mobility Bed Mobility: Yes Rolling Right: 6: Modified independent (Device/Increase time) Rolling Left: 6: Modified independent (Device/Increase time) Right Sidelying to Sit:  (HOB elevated 30 degrees) Left Sidelying to Sit: 6: Modified independent (Device/Increase time);HOB elevated (comment degrees) (HOB elevated to 30 degrees) Sitting - Scoot to Edge of Bed: 4: Min assist Sitting - Scoot to [place] of Bed Details (indicate cue type and reason): VC's needed and increased time.  Moves very little with each scoot. Sit to Sidelying Left: 6: Modified independent (Device/Increase time) Transfers Transfers: Yes Sit to Stand: 4: Min assist Sit to Stand Details (indicate cue type and reason): Vc's for UE placement/use; tends to want to pull up with walker. Stand to Sit: 4: Min assist Stand to Sit Details: safety cues; tends to reach for surface while stepping out of BOS of walker/improper use of UE's to control descent Ambulation/Gait Ambulation/Gait: Yes Ambulation/Gait  Assistance: 3: Mod assist;2: Max assist Ambulation/Gait Assistance Details (indicate cue type and reason): Mod assist for first 8 feet then required max assist due to legs buckling/short shuffling steps and forward posture.  Pt. very unsafe with AD and mobility. Ambulation Distance (Feet): 15 Feet Assistive device: Rolling walker Gait Pattern: Step-through pattern;Decreased stride length;Trunk flexed;Right flexed knee in stance;Left flexed knee in stance Gait velocity: extremely slow and labored   End of Session PT - End of Session Equipment Utilized During Treatment: Gait belt Activity Tolerance: Patient limited by fatigue;Patient limited by pain Patient left: in bed;with call bell in reach;with family/visitor present General Behavior During Session: [facility] for tasks performed Cognition: [facility] for tasks performed  Shaiden Aldous B. Bascom Levels, PTA 09/26/2011, 11:07 AM

## 2011-11-13 ENCOUNTER — Ambulatory Visit: Payer: Medicare Other | Admitting: Orthopedic Surgery

## 2011-11-17 ENCOUNTER — Other Ambulatory Visit: Payer: Self-pay | Admitting: Internal Medicine

## 2011-11-27 ENCOUNTER — Encounter: Payer: Self-pay | Admitting: Orthopedic Surgery

## 2011-11-27 ENCOUNTER — Ambulatory Visit (INDEPENDENT_AMBULATORY_CARE_PROVIDER_SITE_OTHER): Payer: Medicare Other | Admitting: Orthopedic Surgery

## 2011-11-27 VITALS — BP 90/56 | Ht 63.5 in | Wt 141.0 lb

## 2011-11-27 DIAGNOSIS — M4800 Spinal stenosis, site unspecified: Secondary | ICD-10-CM

## 2011-11-27 DIAGNOSIS — G8929 Other chronic pain: Secondary | ICD-10-CM

## 2011-11-27 MED ORDER — GABAPENTIN 300 MG PO CAPS: 300.0000 mg | ORAL_CAPSULE | Freq: Three times a day (TID) | ORAL | Status: DC

## 2011-11-27 MED ORDER — HYDROCODONE-ACETAMINOPHEN 5-325 MG PO TABS: 1.0000 | ORAL_TABLET | Freq: Four times a day (QID) | ORAL | Status: AC | PRN

## 2011-11-27 NOTE — Patient Instructions (Addendum)
Activity as tolerated   See Dr Sherwood Gambler as needed   Pick up gabapentin prescription at Pharmacy

## 2011-11-27 NOTE — Progress Notes (Signed)
Patient ID: [name], female   DOB: 1926/10/05, 78 y.o.   MRN: [id number] Chief Complaint  Patient presents with  . Follow-up    3 month recheck response to meds for chronic pain and spinal stenosis     The patient continues with chronic pain, and spinal stenosis, and deteriorating walking function.  Continue medications as ordered as the patient is not a surgical candidate, considering, [name].

## 2012-03-14 ENCOUNTER — Emergency Department (HOSPITAL_COMMUNITY): Payer: Medicare Other

## 2012-03-14 ENCOUNTER — Inpatient Hospital Stay (HOSPITAL_COMMUNITY)
Admission: EM | Admit: 2012-03-14 | Discharge: 2012-03-17 | DRG: 293 | Disposition: A | Discharge status: Skilled Nursing Facility | Payer: Medicare Other | Attending: Internal Medicine | Admitting: Internal Medicine

## 2012-03-14 ENCOUNTER — Encounter (HOSPITAL_COMMUNITY): Payer: Self-pay

## 2012-03-14 ENCOUNTER — Other Ambulatory Visit: Payer: Self-pay

## 2012-03-14 DIAGNOSIS — I495 Sick sinus syndrome: Secondary | ICD-10-CM | POA: Diagnosis present

## 2012-03-14 DIAGNOSIS — Z66 Do not resuscitate: Secondary | ICD-10-CM | POA: Diagnosis present

## 2012-03-14 DIAGNOSIS — M47817 Spondylosis without myelopathy or radiculopathy, lumbosacral region: Secondary | ICD-10-CM

## 2012-03-14 DIAGNOSIS — I10 Essential (primary) hypertension: Secondary | ICD-10-CM | POA: Diagnosis present

## 2012-03-14 DIAGNOSIS — I4891 Unspecified atrial fibrillation: Secondary | ICD-10-CM | POA: Diagnosis present

## 2012-03-14 DIAGNOSIS — M48061 Spinal stenosis, lumbar region without neurogenic claudication: Secondary | ICD-10-CM

## 2012-03-14 DIAGNOSIS — R0902 Hypoxemia: Secondary | ICD-10-CM

## 2012-03-14 DIAGNOSIS — I499 Cardiac arrhythmia, unspecified: Secondary | ICD-10-CM | POA: Diagnosis present

## 2012-03-14 DIAGNOSIS — R7309 Other abnormal glucose: Secondary | ICD-10-CM | POA: Diagnosis present

## 2012-03-14 DIAGNOSIS — I440 Atrioventricular block, first degree: Secondary | ICD-10-CM | POA: Diagnosis present

## 2012-03-14 DIAGNOSIS — M5430 Sciatica, unspecified side: Secondary | ICD-10-CM

## 2012-03-14 DIAGNOSIS — G894 Chronic pain syndrome: Secondary | ICD-10-CM

## 2012-03-14 DIAGNOSIS — R55 Syncope and collapse: Secondary | ICD-10-CM

## 2012-03-14 DIAGNOSIS — T45515A Adverse effect of anticoagulants, initial encounter: Secondary | ICD-10-CM | POA: Diagnosis present

## 2012-03-14 DIAGNOSIS — M129 Arthropathy, unspecified: Secondary | ICD-10-CM | POA: Diagnosis present

## 2012-03-14 DIAGNOSIS — R739 Hyperglycemia, unspecified: Secondary | ICD-10-CM

## 2012-03-14 DIAGNOSIS — J9600 Acute respiratory failure, unspecified whether with hypoxia or hypercapnia: Secondary | ICD-10-CM

## 2012-03-14 DIAGNOSIS — R079 Chest pain, unspecified: Secondary | ICD-10-CM | POA: Diagnosis present

## 2012-03-14 DIAGNOSIS — R000 Tachycardia, unspecified: Secondary | ICD-10-CM | POA: Diagnosis present

## 2012-03-14 DIAGNOSIS — E039 Hypothyroidism, unspecified: Secondary | ICD-10-CM | POA: Diagnosis present

## 2012-03-14 DIAGNOSIS — S32000A Wedge compression fracture of unspecified lumbar vertebra, initial encounter for closed fracture: Secondary | ICD-10-CM

## 2012-03-14 DIAGNOSIS — I509 Heart failure, unspecified: Principal | ICD-10-CM | POA: Diagnosis present

## 2012-03-14 DIAGNOSIS — E876 Hypokalemia: Secondary | ICD-10-CM | POA: Diagnosis present

## 2012-03-14 DIAGNOSIS — E7800 Pure hypercholesterolemia, unspecified: Secondary | ICD-10-CM | POA: Diagnosis present

## 2012-03-14 DIAGNOSIS — Z86711 Personal history of pulmonary embolism: Secondary | ICD-10-CM

## 2012-03-14 DIAGNOSIS — I480 Paroxysmal atrial fibrillation: Secondary | ICD-10-CM

## 2012-03-14 DIAGNOSIS — Z79899 Other long term (current) drug therapy: Secondary | ICD-10-CM

## 2012-03-14 DIAGNOSIS — Z7901 Long term (current) use of anticoagulants: Secondary | ICD-10-CM

## 2012-03-14 DIAGNOSIS — H409 Unspecified glaucoma: Secondary | ICD-10-CM | POA: Diagnosis present

## 2012-03-14 DIAGNOSIS — Z950 Presence of cardiac pacemaker: Secondary | ICD-10-CM

## 2012-03-14 DIAGNOSIS — R791 Abnormal coagulation profile: Secondary | ICD-10-CM | POA: Diagnosis present

## 2012-03-14 DIAGNOSIS — I2510 Atherosclerotic heart disease of native coronary artery without angina pectoris: Secondary | ICD-10-CM | POA: Diagnosis present

## 2012-03-14 DIAGNOSIS — S82892A Other fracture of left lower leg, initial encounter for closed fracture: Secondary | ICD-10-CM

## 2012-03-14 DIAGNOSIS — S2231XA Fracture of one rib, right side, initial encounter for closed fracture: Secondary | ICD-10-CM

## 2012-03-14 LAB — TROPONIN I: Troponin I: <0.3 ng/mL (ref <0.30)

## 2012-03-14 LAB — CBC WITH DIFFERENTIAL/PLATELET
Basophils Absolute: 0 10*3/uL (ref 0.0–0.1)
Basophils Relative: 0 % (ref 0–1)
Hemoglobin: 12.2 g/dL (ref 12.0–15.0)
MCHC: 32.2 g/dL (ref 30.0–36.0)
Monocytes Relative: 13 % — ABNORMAL HIGH (ref 3–12)
Neutro Abs: 9 10*3/uL — ABNORMAL HIGH (ref 1.7–7.7)
Neutrophils Relative %: 73 % (ref 43–77)
Platelets: 225 10*3/uL (ref 150–400)
RBC: 4.49 MIL/uL (ref 3.87–5.11)

## 2012-03-14 LAB — COMPREHENSIVE METABOLIC PANEL
ALT: 23 U/L (ref 0–35)
AST: 29 U/L (ref 0–37)
Albumin: 3.5 g/dL (ref 3.5–5.2)
Alkaline Phosphatase: 126 U/L — ABNORMAL HIGH (ref 39–117)
BUN: 29 mg/dL — ABNORMAL HIGH (ref 6–23)
Chloride: 105 mEq/L (ref 96–112)
Potassium: 3.6 mEq/L (ref 3.5–5.1)
Sodium: 140 mEq/L (ref 135–145)
Total Bilirubin: 0.3 mg/dL (ref 0.3–1.2)

## 2012-03-14 LAB — PRO B NATRIURETIC PEPTIDE: Pro B Natriuretic peptide (BNP): 1320 pg/mL — ABNORMAL HIGH (ref 0–450)

## 2012-03-14 LAB — PROTIME-INR: INR: 3.64 — ABNORMAL HIGH (ref 0.00–1.49)

## 2012-03-14 MED ORDER — FUROSEMIDE 10 MG/ML IJ SOLN
INTRAMUSCULAR | Status: AC
  Filled 2012-03-14: qty 10

## 2012-03-14 MED ORDER — ONDANSETRON HCL 4 MG/2ML IJ SOLN
4.0000 mg | Freq: Once | INTRAMUSCULAR | Status: AC
  Administered 2012-03-14: 4 mg via INTRAVENOUS
  Filled 2012-03-14: qty 2

## 2012-03-14 MED ORDER — FUROSEMIDE 10 MG/ML IJ SOLN
60.0000 mg | Freq: Once | INTRAMUSCULAR | Status: AC
  Administered 2012-03-14: 60 mg via INTRAVENOUS

## 2012-03-14 MED ORDER — MORPHINE SULFATE 4 MG/ML IJ SOLN
4.0000 mg | Freq: Once | INTRAMUSCULAR | Status: AC
  Administered 2012-03-14: 4 mg via INTRAVENOUS
  Filled 2012-03-14: qty 1

## 2012-03-14 MED ORDER — SODIUM CHLORIDE 0.9 % IV SOLN
INTRAVENOUS | Status: DC
  Administered 2012-03-14: 75 mL/h via INTRAVENOUS

## 2012-03-14 NOTE — ED Notes (Signed)
C/o some itching above IV site.  Redness noted along the vein, site infusing well. Advised itching will subside soon.

## 2012-03-14 NOTE — ED Notes (Signed)
She said her chest pain started around 3 pm this afternoon, feels like pressure, and when she pushes on it, it hurts per EMS.

## 2012-03-14 NOTE — ED Notes (Signed)
O2 started at 2L per cannula for low sat of 88-89  Patient drowsy after small amount of Morphine IV - continued with remaining Morphine - Sats increased with O2.  Family sitting with patient.

## 2012-03-14 NOTE — ED Provider Notes (Signed)
History   This chart was scribed for Ward Givens, MD by Sofie Rower. The patient was seen in room APA10/APA10 and the patient's care was started at 8:33 PM     CSN: [id number]  Arrival date & time 03/14/12  2021   First MD Initiated Contact with Patient 03/14/12 2022      Chief Complaint  Patient presents with  . Chest Pain    (Consider location/radiation/quality/duration/timing/severity/associated sxs/prior treatment) HPI  [name] is a 78 y.o. female who presents to the Emergency Department complaining of chest pain located at the center of the chest onset today (3:00PM) with associated symptoms of shortness of breath, difficulty breathing, non productive cough. Patient unsure of fever.The pt informs the EDP that the chest pain is constant and located at the center of the chest. The pt reports the chest pain as an ache. Modifying factors include coughing which intensifies the chest pain, nothing makes it feel better.Pt has a hx of similar symptoms in the past where which the patient had a stent placed (2003) CHF, coronary artery disease.   Pt denies fever, breathing oxygen at home.  PCP is Dr. Sherwood Gambler.  Cardiologist , Dr. Moreen Fowler    Past Medical History  Diagnosis Date  . Hypertension   . High cholesterol   . Arthritis   . Hypothyroidism   . Coronary artery disease   . Arrhythmia     pacemaker  . Glaucoma   . Pulmonary embolism 06/04/11    Past Surgical History  Procedure Date  . Total hip arthroplasty     right side  . Pacemaker insertion   . Appendectomy   . Abdominal hysterectomy   cardiac stents 2003  No family history on file.  History  Substance Use Topics  . Smoking status: Never Smoker   . Smokeless tobacco: Not on file  . Alcohol Use: No   Pt does not smoke.  Pt lives at [facility].   OB History    Grav Para Term Preterm Abortions TAB SAB Ect Mult Living                  Review of Systems  All other systems reviewed and are  negative.    10 Systems reviewed and all are negative for acute change except as noted in the HPI.    Allergies  Review of patient's allergies indicates no known allergies.  Home Medications   Current Outpatient Rx  Name Route Sig Dispense Refill  . AMIODARONE HCL 200 MG PO TABS Oral Take 200 mg by mouth daily.     Marland Kitchen BIMATOPROST 0.01 % OP SOLN Ophthalmic Apply 1 drop to eye at bedtime.    Marland Kitchen BRIMONIDINE TARTRATE 0.1 % OP SOLN Both Eyes Place 1 drop into both eyes 2 (two) times daily.    Marland Kitchen CALCIUM 600 + D PO Oral Take 1 tablet by mouth 2 (two) times daily.    . DORZOLAMIDE HCL 2 % OP SOLN Both Eyes Place 1 drop into both eyes 2 (two) times daily.    . FUROSEMIDE 80 MG PO TABS Oral Take 1 tablet (80 mg total) by mouth 2 (two) times daily. 60 tablet 0  . GABAPENTIN 300 MG PO CAPS Oral Take 300 mg by mouth 2 (two) times daily.    Marland Kitchen HYDROCODONE-ACETAMINOPHEN 5-325 MG PO TABS Oral Take 1 tablet by mouth every 4 (four) hours as needed.    Marland Kitchen METOPROLOL TARTRATE 50 MG PO TABS Oral Take 50 mg  by mouth every morning.    Marland Kitchen PRAVASTATIN SODIUM 20 MG PO TABS Oral Take 20 mg by mouth daily.     Marland Kitchen SIMVASTATIN 20 MG PO TABS Oral Take 20 mg by mouth every evening.    Marland Kitchen TRIMETHOPRIM 100 MG PO TABS Oral Take 100 mg by mouth at bedtime.    . WARFARIN SODIUM 2 MG PO TABS Oral Take 1 mg by mouth as directed. Take one tablet (2mg ) on Sundays    . WARFARIN SODIUM 3 MG PO TABS Oral Take 3 mg by mouth as directed. Take one tablet (3mg ) on Mondays through Saturdays. Take (2mg ) on Sundays only      BP 188/87  Pulse 109  Temp 100.1 F (37.8 C) (Oral)  Resp 20  Ht 5\' 5"  (1.651 m)  Wt 149 lb (67.586 kg)  BMI 24.79 kg/m2  SpO2 96%  Vital signs normal except hypertension, tachycardia, low-grade temp   Physical Exam  Nursing note and vitals reviewed. Constitutional: She is oriented to person, place, and time. She appears well-developed and well-nourished.  HENT:  Head: Normocephalic and atraumatic.    Right Ear: External ear normal.  Left Ear: External ear normal.  Nose: Nose normal.  Mouth/Throat: Oropharynx is clear and moist.  Eyes: Conjunctivae and EOM are normal. Pupils are equal, round, and reactive to light.  Neck: Normal range of motion. Neck supple.  Cardiovascular: Normal rate, regular rhythm and normal heart sounds.   No murmur heard. Pulmonary/Chest: Effort normal and breath sounds normal. No respiratory distress. She has no wheezes. She has no rales. She exhibits tenderness.  Musculoskeletal: She exhibits edema (1 + pitting edema bilaterally). She exhibits no tenderness.  Neurological: She is alert and oriented to person, place, and time.  Skin: Skin is warm and dry.  Psychiatric: Her behavior is normal.       Anxious.     ED Course  Procedures (including critical care time)   Medications  0.9 %  sodium chloride infusion (75 mL/hr Intravenous New Bag/Given 03/14/12 2134)  morphine 4 MG/ML injection 4 mg (4 mg Intravenous Given 03/14/12 2137)  ondansetron (ZOFRAN) injection 4 mg (4 mg Intravenous Given 03/14/12 2135)  furosemide (LASIX) injection 60 mg (60 mg Intravenous Given 03/14/12 2355)    8:45PM- EDP at bedside discusses treatment plan concerning IV fluids, pain medication.   Patient had good response to the IV Lasix with good urine output.  Nurse reports about 12:30 AM but patient's pulse ox dropped to 82-86% when she was off her oxygen.  [id number] Dr Orvan Falconer, admit to tele  Results for orders placed during the hospital encounter of 03/14/12  CBC WITH DIFFERENTIAL      Component Value Range   WBC 12.3 (*) 4.0 - 10.5 K/uL   RBC 4.49  3.87 - 5.11 MIL/uL   Hemoglobin 12.2  12.0 - 15.0 g/dL   HCT 29.5  62.1 - 30.8 %   MCV 84.4  78.0 - 100.0 fL   MCH 27.2  26.0 - 34.0 pg   MCHC 32.2  30.0 - 36.0 g/dL   RDW 65.7  84.6 - 96.2 %   Platelets 225  150 - 400 K/uL   Neutrophils Relative 73  43 - 77 %   Neutro Abs 9.0 (*) 1.7 - 7.7 K/uL   Lymphocytes Relative 13   12 - 46 %   Lymphs Abs 1.5  0.7 - 4.0 K/uL   Monocytes Relative 13 (*) 3 - 12 %   Monocytes Absolute  1.6 (*) 0.1 - 1.0 K/uL   Eosinophils Relative 1  0 - 5 %   Eosinophils Absolute 0.1  0.0 - 0.7 K/uL   Basophils Relative 0  0 - 1 %   Basophils Absolute 0.0  0.0 - 0.1 K/uL  COMPREHENSIVE METABOLIC PANEL      Component Value Range   Sodium 140  135 - 145 mEq/L   Potassium 3.6  3.5 - 5.1 mEq/L   Chloride 105  96 - 112 mEq/L   CO2 23  19 - 32 mEq/L   Glucose, Bld 166 (*) 70 - 99 mg/dL   BUN 29 (*) 6 - 23 mg/dL   Creatinine, Ser 0.45 (*) 0.50 - 1.10 mg/dL   Calcium 9.8  8.4 - 40.9 mg/dL   Total Protein 6.9  6.0 - 8.3 g/dL   Albumin 3.5  3.5 - 5.2 g/dL   AST 29  0 - 37 U/L   ALT 23  0 - 35 U/L   Alkaline Phosphatase 126 (*) 39 - 117 U/L   Total Bilirubin 0.3  0.3 - 1.2 mg/dL   GFR calc non Af Amer 32 (*) >90 mL/min   GFR calc Af Amer 37 (*) >90 mL/min  TROPONIN I      Component Value Range   Troponin I <0.30  <0.30 ng/mL  APTT      Component Value Range   aPTT 45 (*) 24 - 37 seconds  PROTIME-INR      Component Value Range   Prothrombin Time 36.8 (*) 11.6 - 15.2 seconds   INR 3.64 (*) 0.00 - 1.49  PRO B NATRIURETIC PEPTIDE      Component Value Range   Pro B Natriuretic peptide (BNP) 1320.0 (*) 0 - 450 pg/mL  TROPONIN I      Component Value Range   Troponin I <0.30  <0.30 ng/mL   Laboratory interpretation all normal except mildly elevated BNP, over therapeutic INR, leukocytosis, hyperglycemia, stable renal insufficiency   Dg Chest Port 1 View  03/14/2012  *RADIOLOGY REPORT*  Clinical Data: Shortness of breath, cough and wheezing.  PORTABLE CHEST - 1 VIEW  Comparison: 09/24/2011 and CT chest 08/10/2011.  Findings: Trachea is midline.  Heart is mildly enlarged.  Pacemaker lead tips project over the right atrium and right ventricle. Thoracic aorta is calcified.  There is mild diffuse interstitial prominence.  No definite pleural fluid.  IMPRESSION: Mild pulmonary edema appears  superimposed on probable underlying chronic lung disease.  Original Report Authenticated By: Reyes Ivan, M.D.    Date: 03/15/2012  Rate: 110  Rhythm: Electronic ventricular pacemaker Old EKG Reviewed: unchanged from 06/03/2011 HR was 65     1. Hypoxia   2. Chest pain   3. Warfarin-induced coagulopathy    Plan admission    MDM   I personally performed the services described in this documentation, which was scribed in my presence. The recorded information has been reviewed and considered.  Devoria Albe, MD, Armando Gang    Ward Givens, MD 03/15/12 [phone]

## 2012-03-15 ENCOUNTER — Inpatient Hospital Stay (HOSPITAL_COMMUNITY): Payer: Medicare Other

## 2012-03-15 ENCOUNTER — Encounter (HOSPITAL_COMMUNITY): Payer: Self-pay | Admitting: Internal Medicine

## 2012-03-15 DIAGNOSIS — R739 Hyperglycemia, unspecified: Secondary | ICD-10-CM | POA: Diagnosis present

## 2012-03-15 DIAGNOSIS — I10 Essential (primary) hypertension: Secondary | ICD-10-CM

## 2012-03-15 DIAGNOSIS — R0902 Hypoxemia: Secondary | ICD-10-CM

## 2012-03-15 DIAGNOSIS — I509 Heart failure, unspecified: Principal | ICD-10-CM

## 2012-03-15 DIAGNOSIS — J9600 Acute respiratory failure, unspecified whether with hypoxia or hypercapnia: Secondary | ICD-10-CM | POA: Diagnosis present

## 2012-03-15 DIAGNOSIS — R079 Chest pain, unspecified: Secondary | ICD-10-CM | POA: Diagnosis present

## 2012-03-15 LAB — URINALYSIS, ROUTINE W REFLEX MICROSCOPIC
Leukocytes, UA: NEGATIVE
Nitrite: NEGATIVE
Specific Gravity, Urine: 1.01 (ref 1.005–1.030)
Urobilinogen, UA: 0.2 mg/dL (ref 0.0–1.0)
pH: 7.5 (ref 5.0–8.0)

## 2012-03-15 LAB — CARDIAC PANEL(CRET KIN+CKTOT+MB+TROPI)
CK, MB: 1.6 ng/mL (ref 0.3–4.0)
Relative Index: INVALID (ref 0.0–2.5)
Relative Index: INVALID (ref 0.0–2.5)
Total CK: 75 U/L (ref 7–177)
Total CK: 63 U/L (ref 7–177)
Troponin I: <0.3 ng/mL (ref <0.30)

## 2012-03-15 LAB — CBC
MCHC: 31.7 g/dL (ref 30.0–36.0)
RDW: 15.5 % (ref 11.5–15.5)

## 2012-03-15 LAB — PROTIME-INR
INR: 3.42 — ABNORMAL HIGH (ref 0.00–1.49)
Prothrombin Time: 35 seconds — ABNORMAL HIGH (ref 11.6–15.2)

## 2012-03-15 LAB — HEPATIC FUNCTION PANEL
AST: 27 U/L (ref 0–37)
Albumin: 3.3 g/dL — ABNORMAL LOW (ref 3.5–5.2)
Total Bilirubin: 0.5 mg/dL (ref 0.3–1.2)
Total Protein: 6.4 g/dL (ref 6.0–8.3)

## 2012-03-15 LAB — MRSA PCR SCREENING: MRSA by PCR: NEGATIVE

## 2012-03-15 MED ORDER — SODIUM CHLORIDE 0.9 % IJ SOLN
3.0000 mL | Freq: Two times a day (BID) | INTRAMUSCULAR | Status: DC
  Administered 2012-03-15 – 2012-03-17 (×4): 3 mL via INTRAVENOUS
  Filled 2012-03-15 (×3): qty 3

## 2012-03-15 MED ORDER — ACETAMINOPHEN 325 MG PO TABS
650.0000 mg | ORAL_TABLET | ORAL | Status: DC | PRN
  Administered 2012-03-15 – 2012-03-17 (×5): 650 mg via ORAL
  Filled 2012-03-15 (×5): qty 2

## 2012-03-15 MED ORDER — SIMVASTATIN 20 MG PO TABS
20.0000 mg | ORAL_TABLET | Freq: Every evening | ORAL | Status: DC
  Administered 2012-03-15 – 2012-03-16 (×2): 20 mg via ORAL
  Filled 2012-03-15 (×2): qty 1

## 2012-03-15 MED ORDER — GABAPENTIN 300 MG PO CAPS
300.0000 mg | ORAL_CAPSULE | Freq: Two times a day (BID) | ORAL | Status: DC
  Administered 2012-03-15 – 2012-03-17 (×5): 300 mg via ORAL
  Filled 2012-03-15 (×5): qty 1

## 2012-03-15 MED ORDER — TRIMETHOPRIM 100 MG PO TABS
100.0000 mg | ORAL_TABLET | Freq: Every day | ORAL | Status: DC
  Administered 2012-03-15 – 2012-03-16 (×2): 100 mg via ORAL
  Filled 2012-03-15 (×4): qty 1

## 2012-03-15 MED ORDER — ACETAMINOPHEN 650 MG RE SUPP: 650.0000 mg | Freq: Four times a day (QID) | RECTAL | Status: DC | PRN

## 2012-03-15 MED ORDER — FUROSEMIDE 80 MG PO TABS
80.0000 mg | ORAL_TABLET | Freq: Two times a day (BID) | ORAL | Status: DC
  Administered 2012-03-15: 80 mg via ORAL
  Filled 2012-03-15: qty 1

## 2012-03-15 MED ORDER — BIMATOPROST 0.01 % OP SOLN
1.0000 [drp] | Freq: Every day | OPHTHALMIC | Status: DC
Start: 2012-03-15 — End: 2012-03-17
  Administered 2012-03-15 – 2012-03-16 (×2): 1 [drp] via OPHTHALMIC
  Filled 2012-03-15: qty 2.5

## 2012-03-15 MED ORDER — HYDROMORPHONE HCL PF 1 MG/ML IJ SOLN
0.5000 mg | INTRAMUSCULAR | Status: DC | PRN
  Administered 2012-03-15 – 2012-03-17 (×5): 0.5 mg via INTRAVENOUS
  Filled 2012-03-15 (×6): qty 1

## 2012-03-15 MED ORDER — AMIODARONE HCL 200 MG PO TABS
200.0000 mg | ORAL_TABLET | Freq: Every day | ORAL | Status: DC
  Administered 2012-03-15 – 2012-03-17 (×3): 200 mg via ORAL
  Filled 2012-03-15 (×3): qty 1

## 2012-03-15 MED ORDER — FUROSEMIDE 10 MG/ML IJ SOLN
80.0000 mg | Freq: Two times a day (BID) | INTRAMUSCULAR | Status: DC
  Administered 2012-03-15 – 2012-03-17 (×5): 80 mg via INTRAVENOUS
  Filled 2012-03-15 (×5): qty 8

## 2012-03-15 MED ORDER — BRIMONIDINE TARTRATE 0.15 % OP SOLN
1.0000 [drp] | Freq: Two times a day (BID) | OPHTHALMIC | Status: DC
  Administered 2012-03-15 – 2012-03-17 (×5): 1 [drp] via OPHTHALMIC
  Filled 2012-03-15: qty 5

## 2012-03-15 MED ORDER — DORZOLAMIDE HCL 2 % OP SOLN
1.0000 [drp] | Freq: Two times a day (BID) | OPHTHALMIC | Status: DC
  Administered 2012-03-15 – 2012-03-17 (×5): 1 [drp] via OPHTHALMIC
  Filled 2012-03-15: qty 10

## 2012-03-15 MED ORDER — ONDANSETRON HCL 4 MG/2ML IJ SOLN: 4.0000 mg | INTRAMUSCULAR | Status: DC | PRN

## 2012-03-15 MED ORDER — ASPIRIN EC 81 MG PO TBEC
81.0000 mg | DELAYED_RELEASE_TABLET | Freq: Every day | ORAL | Status: DC
  Administered 2012-03-15 – 2012-03-17 (×3): 81 mg via ORAL
  Filled 2012-03-15 (×3): qty 1

## 2012-03-15 MED ORDER — METOPROLOL SUCCINATE ER 50 MG PO TB24
50.0000 mg | ORAL_TABLET | Freq: Every day | ORAL | Status: DC
  Administered 2012-03-15 – 2012-03-17 (×3): 50 mg via ORAL
  Filled 2012-03-15 (×3): qty 1

## 2012-03-15 MED ORDER — TRAZODONE HCL 50 MG PO TABS
25.0000 mg | ORAL_TABLET | Freq: Every evening | ORAL | Status: DC | PRN
  Administered 2012-03-15: 25 mg via ORAL
  Filled 2012-03-15: qty 1

## 2012-03-15 MED ORDER — BRIMONIDINE TARTRATE 0.1 % OP SOLN: 1.0000 [drp] | Freq: Two times a day (BID) | OPHTHALMIC | Status: DC

## 2012-03-15 MED ORDER — HYDRALAZINE HCL 20 MG/ML IJ SOLN
10.0000 mg | INTRAMUSCULAR | Status: DC | PRN
  Administered 2012-03-15: 10 mg via INTRAVENOUS
  Filled 2012-03-15: qty 1

## 2012-03-15 NOTE — Progress Notes (Signed)
Contacted both of the patients sons, Nedra Hai and [place], respectively.  I gave them an update on the status of their mom- [employer] will be coming to [facility] to look at pacemaker today. Told both sons that Tamera Punt ws the night nurse and would be more than happy to communicate with as well. Katherina Mires [place]

## 2012-03-15 NOTE — Progress Notes (Signed)
Contacted px's spouse to let him know a Energy manager would be coming to check his wife's pacemaker. [name]

## 2012-03-15 NOTE — Social Work (Signed)
Clinical Social Work Department BRIEF PSYCHOSOCIAL ASSESSMENT 03/15/2012  Patient:  [name],[name]     Account Number:  [id number]     Admit date:  03/14/2012  Clinical Social Worker:  Eddie Candle  Date/Time:  03/15/2012 12:30 PM  Referred by:  Physician  Date Referred:  03/15/2012 Referred for  SNF Placement   Other Referral:   Interview type:  Patient Other interview type:   Husband present    PSYCHOSOCIAL DATA Living Status:  FACILITY Admitted from facility:   [name] Level of care:  Assisted Living Primary support name:  [name] [phone] Primary support relationship to patient:  SPOUSE Degree of support available:   good    CURRENT CONCERNS Current Concerns  Post-Acute Placement   Other Concerns:    SOCIAL WORK ASSESSMENT / PLAN Patient was at [facility] with HHPT for past 3 months. Pt admitted with CHF exacerbation.  Patient improving. Patient and spouse in agreement with patient returning to [place] at discharge for continued support and skilled care.   Assessment/plan status:  Information/Referral to [facility] Other assessment/ plan:   Information/referral to community resources:   Will complete FL2 and documenation for readmission to ALF.    PATIENT'S/FAMILY'S RESPONSE TO PLAN OF CARE: Patient and husband in agreement with plan.

## 2012-03-15 NOTE — Progress Notes (Signed)
Subjective: This lady was admitted yesterday with acute congestive heart failure. She feels improved with her breathing at the present time but appears to be still dyspneic at rest. She has no chest pain now.           Physical Exam: Blood pressure 131/69, pulse 68, temperature 98.1 F (36.7 C), temperature source Oral, resp. rate 18, height 5\' 3"  (1.6 m), weight 67.8 kg (149 lb 7.6 oz), SpO2 94.00%. She looks chronically sick. She looks dyspneic at rest. Heart sounds are present and appear to be in sinus rhythm at the present time. Lung fields are clinically clear with a few inspiratory crackles at the bases. Jugular venous pressure not raised. She is alert and orientated.   Investigations:  Recent Results (from the past 240 hour(s))  MRSA PCR SCREENING     Status: Normal   Collection Time   03/15/12  5:35 AM      Component Value Range Status Comment   MRSA by PCR NEGATIVE  NEGATIVE Final      Basic Metabolic Panel:  [facility] 03/14/12 2043  NA 140  K 3.6  CL 105  CO2 23  GLUCOSE 166*  BUN 29*  CREATININE 1.47*  CALCIUM 9.8  MG --  PHOS --   Liver Function Tests:  [facility] 03/15/12 0713 03/14/12 2043  AST 27 29  ALT 21 23  ALKPHOS 96 126*  BILITOT 0.5 0.3  PROT 6.4 6.9  ALBUMIN 3.3* 3.5     CBC:  Basename 03/15/12 0713 03/14/12 2043  WBC 12.5* 12.3*  NEUTROABS -- 9.0*  HGB 11.6* 12.2  HCT 36.6 37.9  MCV 85.9 84.4  PLT 203 225    Ct Chest Wo Contrast  03/15/2012  *RADIOLOGY REPORT*  Clinical Data: Chest pain, hypertension.  CT CHEST WITHOUT CONTRAST  Technique:  Multidetector CT imaging of the chest was performed following the standard protocol without IV contrast.  Comparison: 03/14/2012 radiograph, 08/10/2011 CT  Findings: Vasculature evaluation is limited in the absence of intravenous contrast.  Cardiomegaly. Pulmonary venous congestion. Left chest wall battery pack with lead tips within the right atrium and right ventricle.  Coronary artery  calcification.  Mild valve calcifications.  Scattered atherosclerosis of the aorta and branch vessels.  Interlobular septal thickening. Patchy areas of ground-glass opacity.  More confluent airspace opacities within the lung bases bilaterally.  Central airways are patent.  No pneumothorax. No significant pleural effusion.  Limited images through the upper abdomen show no definite acute finding.  Advanced multilevel degenerative changes.  Status post vertebroplasty at L1.  Mild T5 vertebral body height loss is similar to prior.  Osteopenia.  No acute osseous finding.  IMPRESSION: Cardiomegaly with vascular congestion and pulmonary edema pattern.  More confluent bibasilar opacities; edema, pneumonia, or aspiration.  Original Report Authenticated By: Waneta Martins, M.D.   Dg Chest Port 1 View  03/14/2012  *RADIOLOGY REPORT*  Clinical Data: Shortness of breath, cough and wheezing.  PORTABLE CHEST - 1 VIEW  Comparison: 09/24/2011 and CT chest 08/10/2011.  Findings: Trachea is midline.  Heart is mildly enlarged.  Pacemaker lead tips project over the right atrium and right ventricle. Thoracic aorta is calcified.  There is mild diffuse interstitial prominence.  No definite pleural fluid.  IMPRESSION: Mild pulmonary edema appears superimposed on probable underlying chronic lung disease.  Original Report Authenticated By: Reyes Ivan, M.D.      Medications:  Scheduled:   . amiodarone  200 mg Oral Daily  . aspirin EC  81  mg Oral Daily  . bimatoprost  1 drop Both Eyes QHS  . brimonidine  1 drop Both Eyes BID  . dorzolamide  1 drop Both Eyes BID  . furosemide  60 mg Intravenous Once  . furosemide  80 mg Intravenous BID  . gabapentin  300 mg Oral BID  . metoprolol succinate  50 mg Oral Daily  .  morphine injection  4 mg Intravenous Once  . ondansetron (ZOFRAN) IV  4 mg Intravenous Once  . simvastatin  20 mg Oral QPM  . sodium chloride  3 mL Intravenous Q12H  . trimethoprim  100 mg Oral QHS  .  DISCONTD: brimonidine  1 drop Both Eyes BID  . DISCONTD: furosemide  80 mg Oral BID    Impression: 1. Acute congestive heart failure. Improving slowly. 2. Paroxysmal atrial fibrillation, currently clinically in sinus rhythm. 3. Hypertension. 4. Hypothyroidism. 5. Sick sinus syndrome.     Plan: 1. Switch oral Lasix to IV Lasix 80 mg twice a day. 2. Repeat chest x-ray tomorrow morning and follow clinical course regarding discharge.     LOS: 1 day   Wilson Singer Pager [phone]  03/15/2012, 11:47 AM

## 2012-03-15 NOTE — Progress Notes (Signed)
Contacted Dr. Karilyn Cota in regards the patient's pacemaker malfunction. Dr. Karilyn Cota contacted the Cardiologist who in turn contacted [employer] who will come out and look at the patient's pacemaker today.  I let Dr. Karilyn Cota know the patient is sleepy but alert.  Will continue to monitor.  Katherina Mires [place]

## 2012-03-15 NOTE — ED Notes (Signed)
PATIENT REQUESTS FOLEY CATHETER BE INSERTED.  Frequent use of bedpan and exertion

## 2012-03-15 NOTE — Progress Notes (Signed)
ANTICOAGULATION CONSULT NOTE - Initial Consult  Pharmacy Consult for Coumadin Indication: atrial fibrillation  No Known Allergies  Patient Measurements: Height: 5\' 3"  (160 cm) Weight: 149 lb 7.6 oz (67.8 kg) IBW/kg (Calculated) : 52.4    Vital Signs: Temp: 98.1 F (36.7 C) (07/13 0521) Temp src: Oral (07/13 0521) BP: 131/69 mmHg (07/13 0521) Pulse Rate: 68  (07/13 0521)  Labs:  Basename 03/15/12 0836 03/15/12 0713 03/14/12 2254 03/14/12 2043  HGB -- 11.6* -- 12.2  HCT -- 36.6 -- 37.9  PLT -- 203 -- 225  APTT -- -- -- 45*  LABPROT 35.0* -- -- 36.8*  INR 3.42* -- -- 3.64*  HEPARINUNFRC -- -- -- --  CREATININE -- -- -- 1.47*  CKTOTAL -- 75 -- --  CKMB -- 1.6 -- --  TROPONINI -- <0.30 <0.30 <0.30    Estimated Creatinine Clearance: 26.8 ml/min (by C-G formula based on Cr of 1.47).   Medical History: Past Medical History  Diagnosis Date  . Hypertension   . High cholesterol   . Arthritis   . Hypothyroidism   . Coronary artery disease   . Arrhythmia     pacemaker  . Glaucoma   . Pulmonary embolism 06/04/11    Medications:  Scheduled:    . amiodarone  200 mg Oral Daily  . aspirin EC  81 mg Oral Daily  . bimatoprost  1 drop Both Eyes QHS  . brimonidine  1 drop Both Eyes BID  . dorzolamide  1 drop Both Eyes BID  . furosemide  60 mg Intravenous Once  . furosemide  80 mg Oral BID  . gabapentin  300 mg Oral BID  . metoprolol succinate  50 mg Oral Daily  .  morphine injection  4 mg Intravenous Once  . ondansetron (ZOFRAN) IV  4 mg Intravenous Once  . simvastatin  20 mg Oral QPM  . sodium chloride  3 mL Intravenous Q12H  . trimethoprim  100 mg Oral QHS  . DISCONTD: brimonidine  1 drop Both Eyes BID    Assessment: Home dose Coumadin 3 mg Monday through Saturday, 2 mg on Sunday INR presently elevated   Goal of Therapy:  INR 2-3 Monitor platelets by anticoagulation protocol: Yes   Plan:  No Coumadin today INR/PT daily CBC daily, monitoring  platelets  Raquel James, Nithila Sumners Bennett 03/15/2012,10:11 AM

## 2012-03-15 NOTE — H&P (Signed)
[facility] History and Physical  [name] ZOX:[id number] DOB: Apr 05, 1927 DOA: 03/14/2012   PCP:   Cassell Smiles., MD   Chief Complaint:  Chest tightness since today  HPI: [name] is an 78 y.o. female.   Pleasant elderly Caucasian lady, multiple medical problems including paroxysmal atrial fibrillation on Coumadin resident of a nursing facility, she is receiving physical therapy, after fall and fractures, presents with onset of chest tightness after extensive walking with physical therapist today. Reports it is aggravated by exertion and relieved by rest. Denies nausea vomiting dizziness or diaphoresis; night associated shortness of breath or palpitations. He does have a pacemaker.   Patient has no history of chronic hypoxia does not use home oxygen, but EMS and the emergency room found the patient to be hypoxic saturating persistently in the 80s; patient was felt to be in decompensated heart failure and has had marked diuresis after Lasix; despite this she remains hypoxic and the Hospitalist service was called to assist with management.  She reports her primary care physician has discontinued her amlodipine because of leg edema. She ambulates with a walker  Rewiew of Systems:   All systems negative except as marked or noted in the HPI;  Constitutional: Negative for malaise, fever and chills. ;  Eyes: Negative for eye pain; positive for, redness and discharge. ;  ENMT: Negative for ear pain, hoarseness, nasal congestion, sinus pressure and sore throat. ;  Cardiovascular: Negative for palpitations, diaphoresis, dyspnea ;  Respiratory: Negative for cough, hemoptysis, wheezing and stridor. ;  Gastrointestinal: Negative for nausea, vomiting, diarrhea, constipation, abdominal pain, melena, blood in stool, hematemesis, jaundice and rectal bleeding. unusual weight loss..   Genitourinary: Negative for frequency, dysuria, incontinence,flank pain and  hematuria; Musculoskeletal: Negative for back pain and neck pain. Negative for swelling and trauma.;  Skin: . Negative for pruritus, rash, abrasions, bruising and skin lesion.; ulcerations Neuro: Negative for headache, lightheadedness and neck stiffness. Negative for weakness, altered level of consciousness , altered mental status, extremity weakness, burning feet, involuntary movement, seizure and syncope.  Psych: negative for anxiety, depression, insomnia, tearfulness, panic attacks, hallucinations, paranoia, suicidal or homicidal ideation     Past Medical History  Diagnosis Date  . Hypertension   . High cholesterol   . Arthritis   . Hypothyroidism   . Coronary artery disease   . Arrhythmia     pacemaker  . Glaucoma   . Pulmonary embolism 06/04/11    Past Surgical History  Procedure Date  . Total hip arthroplasty     right side  . Pacemaker insertion   . Appendectomy   . Abdominal hysterectomy     Medications:  HOME MEDS: Prior to Admission medications   Medication Sig Start Date End Date Taking? Authorizing Provider  amiodarone (PACERONE) 200 MG tablet Take 200 mg by mouth daily.  04/30/11  Yes Historical Provider, MD  bimatoprost (LUMIGAN) 0.01 % SOLN Apply 1 drop to eye at bedtime.   Yes Historical Provider, MD  brimonidine (ALPHAGAN P) 0.1 % SOLN Place 1 drop into both eyes 2 (two) times daily.   Yes Historical Provider, MD  Calcium Carbonate-Vitamin D (CALCIUM 600 + D PO) Take 1 tablet by mouth 2 (two) times daily.   Yes Historical Provider, MD  dorzolamide (TRUSOPT) 2 % ophthalmic solution Place 1 drop into both eyes 2 (two) times daily.   Yes Historical Provider, MD  furosemide (LASIX) 80 MG tablet Take 1 tablet (80 mg total) by mouth 2 (two) times daily.  09/26/11 09/25/12 Yes Erick Blinks, MD  gabapentin (NEURONTIN) 300 MG capsule Take 300 mg by mouth 2 (two) times daily. 11/27/11 11/26/12 Yes Vickki Hearing, MD  HYDROcodone-acetaminophen (NORCO) 5-325 MG per  tablet Take 1 tablet by mouth every 4 (four) hours as needed.   Yes Historical Provider, MD  metoprolol (LOPRESSOR) 50 MG tablet Take 50 mg by mouth every morning.   Yes Historical Provider, MD  pravastatin (PRAVACHOL) 20 MG tablet Take 20 mg by mouth daily.  04/14/11  Yes Historical Provider, MD  simvastatin (ZOCOR) 20 MG tablet Take 20 mg by mouth every evening.   Yes Historical Provider, MD  trimethoprim (TRIMPEX) 100 MG tablet Take 100 mg by mouth at bedtime.   Yes Historical Provider, MD  warfarin (COUMADIN) 2 MG tablet Take 1 mg by mouth as directed. Take one tablet (2mg ) on Sundays 05/23/11 05/22/12 Yes Nimish Normajean Glasgow, MD  warfarin (COUMADIN) 3 MG tablet Take 3 mg by mouth as directed. Take one tablet (3mg ) on Mondays through Saturdays. Take (2mg ) on Sundays only   Yes Historical Provider, MD     Allergies:  No Known Allergies  Social History:   reports that she has never smoked. She does not have any smokeless tobacco history on file. She reports that she does not drink alcohol or use illicit drugs.  Family History: No family history on file.   Physical Exam: Filed Vitals:   03/15/12 0030 03/15/12 0032 03/15/12 0039 03/15/12 0046  BP:   176/74   Pulse: 54 60 76 60  Temp:      TempSrc:      Resp: 22 25 24 21   Height:      Weight:      SpO2: 86% 82% 85% 97%   Blood pressure 176/74, pulse 60, temperature 100.1 F (37.8 C), temperature source Oral, resp. rate 21, height 5\' 5"  (1.651 m), weight 67.586 kg (149 lb), SpO2 97.00%.  GEN:  Pleasant elderly Caucasian lady lying in the stretcher in no acute distress, but weak looking; cooperative with exam PSYCH:  alert and oriented x3; does not appear anxious or depressed; affect is appropriate. HEENT: Mucous membranes pink and anicteric; PERRLA; EOM intact; periocular redness; scleral injection;no cervical lymphadenopathy nor thyromegaly or carotid bruit; no JVD; Breasts:: Not examined CHEST WALL: Moderate kyphosis; No  tenderness CHEST: Normal respiration, clear to auscultation bilaterally HEART: Regular rate and rhythm; no murmurs rubs or gallops BACK: No kyphosis or scoliosis; no CVA tenderness ABDOMEN:  soft non-tender; no masses, no organomegaly, normal abdominal bowel sounds;  no intertriginous candida. Rectal Exam: Not done EXTREMITIES:  age-appropriate arthropathy of the hands and knees; trace edema; no ulcerations. Genitalia: not examined PULSES: 2+ and symmetric SKIN: Normal hydration no rash or ulceration CNS: Cranial nerves 2-12 grossly intact no focal lateralizing neurologic deficit   Labs on Admission:  Basic Metabolic Panel:  Lab 03/14/12 1610  NA 140  K 3.6  CL 105  CO2 23  GLUCOSE 166*  BUN 29*  CREATININE 1.47*  CALCIUM 9.8  MG --  PHOS --   Liver Function Tests:  Lab 03/14/12 2043  AST 29  ALT 23  ALKPHOS 126*  BILITOT 0.3  PROT 6.9  ALBUMIN 3.5   No results found for this basename: LIPASE:5,AMYLASE:5 in the last 168 hours No results found for this basename: AMMONIA:5 in the last 168 hours CBC:  Lab 03/14/12 2043  WBC 12.3*  NEUTROABS 9.0*  HGB 12.2  HCT 37.9  MCV 84.4  PLT  225   Cardiac Enzymes:  Lab 03/14/12 2254 03/14/12 2043  CKTOTAL -- --  CKMB -- --  CKMBINDEX -- --  TROPONINI <0.30 <0.30   BNP: No components found with this basename: POCBNP:5 CBG: No results found for this basename: GLUCAP:5 in the last 168 hours  Radiological Exams on Admission: Dg Chest Port 1 View  03/14/2012  *RADIOLOGY REPORT*  Clinical Data: Shortness of breath, cough and wheezing.  PORTABLE CHEST - 1 VIEW  Comparison: 09/24/2011 and CT chest 08/10/2011.  Findings: Trachea is midline.  Heart is mildly enlarged.  Pacemaker lead tips project over the right atrium and right ventricle. Thoracic aorta is calcified.  There is mild diffuse interstitial prominence.  No definite pleural fluid.  IMPRESSION: Mild pulmonary edema appears superimposed on probable underlying chronic  lung disease.  Original Report Authenticated By: Reyes Ivan, M.D.    EKG: Independently reviewed. Paced rhythm.   Assessment/Plan Present on Admission:   .Acute respiratory failure, possible pneumonia  .Chest pain .CHF exacerbation  .PAF (paroxysmal atrial fibrillation) .Sick sinus syndrome .Hypothyroidism .HTN (hypertension) .Chronic pain syndrome  .Hyperglycemia   PLAN: Admit this lady for continued management of her CHF exacerbation, and serial markers to rule out an acute coronary event. Pulmonary embolism is very unlikely given her supratherapeutic INR, but will get a plain CT scan of her chest assessed for possible pneumonia, given chest pain and leukocytosis and hypoxia.  We will have pharmacy dose of warfarin,  Or downward adjustment of INR,  Continue management of chronic medical problem   Other plans as per orders.  Code Status: DO NOT RESUSCITATE continues Family Communication: No family present at this time Disposition Plan: Eventually back to the nursing home when becomes more stable, and the cause is been found for the hypoxia    Zissel Biederman Nocturnist [facility] Pager [phone]   03/15/2012, 4:47 AM

## 2012-03-15 NOTE — ED Notes (Addendum)
Short of breath and continues to have pain   Trial time off O2 when sats had remained normal.  30 minutes off o2 and sats dropped again into the mid to high 80's.  02 restarted at 2 liters per cannula.  Dr. Lynelle Doctor informed

## 2012-03-16 ENCOUNTER — Inpatient Hospital Stay (HOSPITAL_COMMUNITY): Payer: Medicare Other

## 2012-03-16 DIAGNOSIS — R0902 Hypoxemia: Secondary | ICD-10-CM

## 2012-03-16 DIAGNOSIS — I495 Sick sinus syndrome: Secondary | ICD-10-CM

## 2012-03-16 DIAGNOSIS — I4891 Unspecified atrial fibrillation: Secondary | ICD-10-CM

## 2012-03-16 LAB — BASIC METABOLIC PANEL
BUN: 22 mg/dL (ref 6–23)
Calcium: 9.5 mg/dL (ref 8.4–10.5)
GFR calc Af Amer: 47 mL/min — ABNORMAL LOW (ref >90)
GFR calc non Af Amer: 40 mL/min — ABNORMAL LOW (ref >90)
Potassium: 3.4 mEq/L — ABNORMAL LOW (ref 3.5–5.1)
Sodium: 143 mEq/L (ref 135–145)

## 2012-03-16 LAB — CBC
MCHC: 31.9 g/dL (ref 30.0–36.0)
RDW: 15.3 % (ref 11.5–15.5)

## 2012-03-16 LAB — PROTIME-INR
INR: 2.62 — ABNORMAL HIGH (ref 0.00–1.49)
Prothrombin Time: 28.4 seconds — ABNORMAL HIGH (ref 11.6–15.2)

## 2012-03-16 MED ORDER — WARFARIN SODIUM 2 MG PO TABS
2.0000 mg | ORAL_TABLET | Freq: Once | ORAL | Status: AC
  Administered 2012-03-16: 2 mg via ORAL
  Filled 2012-03-16: qty 1

## 2012-03-16 MED ORDER — WARFARIN - PHARMACIST DOSING INPATIENT: Freq: Every day | Status: DC

## 2012-03-16 MED ORDER — POTASSIUM CHLORIDE CRYS ER 20 MEQ PO TBCR
40.0000 meq | EXTENDED_RELEASE_TABLET | Freq: Once | ORAL | Status: AC
  Administered 2012-03-16: 40 meq via ORAL
  Filled 2012-03-16: qty 2

## 2012-03-16 MED ORDER — SODIUM CHLORIDE 0.9 % IJ SOLN
INTRAMUSCULAR | Status: AC
  Administered 2012-03-17: 3 mL
  Filled 2012-03-16: qty 3

## 2012-03-16 NOTE — Progress Notes (Signed)
I reviewed the EKG with the [employer] device representative. She has an "Adapta" model.  The pacer spikes seen represent normal function to permit decreased ventricular pacing - a feature called "MVP". The patient has a long underlying first degree AV block but appears to be conducting to the ventricle without any missed beats.  No pacemaker programming changes are suggested and I did not feel the device needs to be interrogated or reprogrammed at this time.  Please have her follow-up with Dr. Royann Shivers after discharge.  Chrystie Nose, MD, [place] Attending Cardiologist [facility]

## 2012-03-16 NOTE — Progress Notes (Signed)
ANTICOAGULATION CONSULT NOTE - Initial Consult  Pharmacy Consult for Coumadin Indication: atrial fibrillation  No Known Allergies  Patient Measurements: Height: 5\' 3"  (160 cm) Weight: 149 lb 7.6 oz (67.8 kg) IBW/kg (Calculated) : 52.4    Vital Signs: Temp: 98.2 F (36.8 C) (07/14 0621) Temp src: Oral (07/14 0621) BP: 131/57 mmHg (07/14 0621) Pulse Rate: 60  (07/14 0621)  Labs:  Basename 03/16/12 0701 03/15/12 2029 03/15/12 1334 03/15/12 0836 03/15/12 0713 03/14/12 2043  HGB 11.3* -- -- -- 11.6* --  HCT 35.4* -- -- -- 36.6 37.9  PLT 189 -- -- -- 203 225  APTT -- -- -- -- -- 45*  LABPROT 28.4* -- -- 35.0* -- 36.8*  INR 2.62* -- -- 3.42* -- 3.64*  HEPARINUNFRC -- -- -- -- -- --  CREATININE 1.21* -- -- -- -- 1.47*  CKTOTAL -- 84 63 -- 75 --  CKMB -- 1.3 1.5 -- 1.6 --  TROPONINI -- <0.30 <0.30 -- <0.30 --    Estimated Creatinine Clearance: 32.6 ml/min (by C-G formula based on Cr of 1.21).   Medical History: Past Medical History  Diagnosis Date  . Hypertension   . High cholesterol   . Arthritis   . Hypothyroidism   . Coronary artery disease   . Arrhythmia     pacemaker  . Glaucoma   . Pulmonary embolism 06/04/11    Medications:  Scheduled:     . amiodarone  200 mg Oral Daily  . aspirin EC  81 mg Oral Daily  . bimatoprost  1 drop Both Eyes QHS  . brimonidine  1 drop Both Eyes BID  . dorzolamide  1 drop Both Eyes BID  . furosemide  80 mg Intravenous BID  . gabapentin  300 mg Oral BID  . metoprolol succinate  50 mg Oral Daily  . simvastatin  20 mg Oral QPM  . sodium chloride  3 mL Intravenous Q12H  . trimethoprim  100 mg Oral QHS  . DISCONTD: furosemide  80 mg Oral BID    Assessment: Home dose Coumadin 3 mg Monday through Saturday, 2 mg on Sunday INR within goal today  Goal of Therapy:  INR 2-3 Monitor platelets by anticoagulation protocol: Yes   Plan:  Coumadin 2 mg today x1 (home dose regiment) INR/PT daily CBC daily, monitoring  platelets  Raquel James, Giovanny Dugal Bennett 03/16/2012,9:12 AM

## 2012-03-16 NOTE — Progress Notes (Signed)
Subjective: This lady has improved with her congestive heart failure, symptomatically and radiologically. She feels somewhat weak, however. Yesterday, there was a concern regarding her pacemaker. I contacted Dr. Rennis Golden, the cardiologist on call, who reviewed the ECG and spoke with [employer] device representative. It was felt that the pacemaker was functioning appropriately and that no pacemaker programming changes were suggested or that the device needed to be interrogated at this time.           Physical Exam: Blood pressure 131/57, pulse 60, temperature 98.2 F (36.8 C), temperature source Oral, resp. rate 19, height 5\' 3"  (1.6 m), weight 67.8 kg (149 lb 7.6 oz), SpO2 97.00%. She looks chronically sick. She looks better today. Heart sounds are present and appear to be in sinus rhythm at the present time. Lung fields are clinically clear with a few inspiratory crackles at the bases. Jugular venous pressure not raised. She is alert and orientated.   Investigations:  Recent Results (from the past 240 hour(s))  MRSA PCR SCREENING     Status: Normal   Collection Time   03/15/12  5:35 AM      Component Value Range Status Comment   MRSA by PCR NEGATIVE  NEGATIVE Final      Basic Metabolic Panel:  Basename 03/16/12 0701 03/14/12 2043  NA 143 140  K 3.4* 3.6  CL 107 105  CO2 27 23  GLUCOSE 104* 166*  BUN 22 29*  CREATININE 1.21* 1.47*  CALCIUM 9.5 9.8  MG -- --  PHOS -- --   Liver Function Tests:  [facility] 03/15/12 0713 03/14/12 2043  AST 27 29  ALT 21 23  ALKPHOS 96 126*  BILITOT 0.5 0.3  PROT 6.4 6.9  ALBUMIN 3.3* 3.5     CBC:  Basename 03/16/12 0701 03/15/12 0713 03/14/12 2043  WBC 11.5* 12.5* --  NEUTROABS -- -- 9.0*  HGB 11.3* 11.6* --  HCT 35.4* 36.6 --  MCV 85.7 85.9 --  PLT 189 203 --    Dg Chest 1 View  03/16/2012  *RADIOLOGY REPORT*  Clinical Data: Follow-up congestive heart failure.  CHEST - 1 VIEW  Comparison: 03/14/2012  Findings: Cardiomegaly  stable.  Improved aeration of both lungs is seen.  Decreased interstitial infiltrates are seen, consistent with decreased interstitial edema.  No evidence of pulmonary consolidation or pleural effusion.  Transvenous pacemaker leads remain in appropriate position.  IMPRESSION:  1.  Decreased interstitial edema and improved aeration of both lungs since prior exam. 2.  Stable cardiomegaly.  Original Report Authenticated By: Danae Orleans, M.D.   Ct Chest Wo Contrast  03/15/2012  *RADIOLOGY REPORT*  Clinical Data: Chest pain, hypertension.  CT CHEST WITHOUT CONTRAST  Technique:  Multidetector CT imaging of the chest was performed following the standard protocol without IV contrast.  Comparison: 03/14/2012 radiograph, 08/10/2011 CT  Findings: Vasculature evaluation is limited in the absence of intravenous contrast.  Cardiomegaly. Pulmonary venous congestion. Left chest wall battery pack with lead tips within the right atrium and right ventricle.  Coronary artery calcification.  Mild valve calcifications.  Scattered atherosclerosis of the aorta and branch vessels.  Interlobular septal thickening. Patchy areas of ground-glass opacity.  More confluent airspace opacities within the lung bases bilaterally.  Central airways are patent.  No pneumothorax. No significant pleural effusion.  Limited images through the upper abdomen show no definite acute finding.  Advanced multilevel degenerative changes.  Status post vertebroplasty at L1.  Mild T5 vertebral body height loss is similar to prior.  Osteopenia.  No acute osseous finding.  IMPRESSION: Cardiomegaly with vascular congestion and pulmonary edema pattern.  More confluent bibasilar opacities; edema, pneumonia, or aspiration.  Original Report Authenticated By: Waneta Martins, M.D.   Dg Chest Port 1 View  03/14/2012  *RADIOLOGY REPORT*  Clinical Data: Shortness of breath, cough and wheezing.  PORTABLE CHEST - 1 VIEW  Comparison: 09/24/2011 and CT chest 08/10/2011.   Findings: Trachea is midline.  Heart is mildly enlarged.  Pacemaker lead tips project over the right atrium and right ventricle. Thoracic aorta is calcified.  There is mild diffuse interstitial prominence.  No definite pleural fluid.  IMPRESSION: Mild pulmonary edema appears superimposed on probable underlying chronic lung disease.  Original Report Authenticated By: Reyes Ivan, M.D.      Medications:  Scheduled:    . amiodarone  200 mg Oral Daily  . aspirin EC  81 mg Oral Daily  . bimatoprost  1 drop Both Eyes QHS  . brimonidine  1 drop Both Eyes BID  . dorzolamide  1 drop Both Eyes BID  . furosemide  80 mg Intravenous BID  . gabapentin  300 mg Oral BID  . metoprolol succinate  50 mg Oral Daily  . simvastatin  20 mg Oral QPM  . sodium chloride  3 mL Intravenous Q12H  . trimethoprim  100 mg Oral QHS  . warfarin  2 mg Oral ONCE-1800  . Warfarin - Pharmacist Dosing Inpatient   Does not apply q1800  . DISCONTD: furosemide  80 mg Oral BID    Impression: 1. Acute congestive heart failure. Improving . 2. Paroxysmal atrial fibrillation, currently clinically in sinus rhythm. 3. Hypertension. 4. Hypothyroidism. 5. Sick sinus syndrome. 6. Hypokalemia.    Plan: 1. Replete potassium. 2. Probable discharge tomorrow.     LOS: 2 days   Wilson Singer Pager [phone]  03/16/2012, 10:25 AM

## 2012-03-17 DIAGNOSIS — G894 Chronic pain syndrome: Secondary | ICD-10-CM

## 2012-03-17 LAB — COMPREHENSIVE METABOLIC PANEL
ALT: 20 U/L (ref 0–35)
BUN: 27 mg/dL — ABNORMAL HIGH (ref 6–23)
Calcium: 10.1 mg/dL (ref 8.4–10.5)
Creatinine, Ser: 1.27 mg/dL — ABNORMAL HIGH (ref 0.50–1.10)
GFR calc Af Amer: 44 mL/min — ABNORMAL LOW (ref >90)
GFR calc non Af Amer: 38 mL/min — ABNORMAL LOW (ref >90)
Glucose, Bld: 96 mg/dL (ref 70–99)
Sodium: 143 mEq/L (ref 135–145)
Total Protein: 6.7 g/dL (ref 6.0–8.3)

## 2012-03-17 LAB — CBC
Hemoglobin: 11.9 g/dL — ABNORMAL LOW (ref 12.0–15.0)
MCH: 27.6 pg (ref 26.0–34.0)
MCHC: 32.1 g/dL (ref 30.0–36.0)
MCV: 86.1 fL (ref 78.0–100.0)

## 2012-03-17 LAB — PROTIME-INR: Prothrombin Time: 24.8 seconds — ABNORMAL HIGH (ref 11.6–15.2)

## 2012-03-17 MED ORDER — WARFARIN SODIUM 2 MG PO TABS: 3.0000 mg | ORAL_TABLET | Freq: Once | ORAL | Status: DC

## 2012-03-17 NOTE — Clinical Social Work Note (Signed)
Patient ready for discharge and return to [facility].  FL2 prepared and reviewed w RN Val.  Discharge packet prepared and placed for transport.  Family and patient aware and agreeable and willing to transport.  Facility notified and agreeable to receive patient today.  Patient to be transported by spouse.  Clovis Cao Clinical Social Worker [phone])

## 2012-03-17 NOTE — Discharge Summary (Signed)
Physician Discharge Summary  [name] QIO:[id number] DOB: 09-Mar-1927 DOA: 03/14/2012  PCP: Cassell Smiles., MD  Admit date: 03/14/2012 Discharge date: 03/17/2012  Discharge Diagnoses:    1. CHF exacerbation, clinically compensated now. 2. Paroxysmal atrial fibrillation, stable. 3. Sick sinus syndrome, status post biventricular pacemaker. 4. Hypertension. 5. Hypothyroidism, stable. 6. Chronic pain syndrome. 7. DO NOT RESUSCITATE.  Discharge Condition: Stable.  Diet recommendation: Heart healthy diet.  History of present illness:  This rather frail 78 year old lady was admitted with symptoms of chest tightness and dyspnea. She was felt to be in congestive heart failure on admission. The symptoms started after exertion and relieved by rest. She recently had had a fall and had been having physical therapy at [facility].  Hospital Course:  On admission, she was felt to be in congestive heart failure. She was ever treated with intravenous diuretics. Serial cardiac enzymes were negative. She has made slow but gradual progress. She still is rather frail and her mobility is rather poor. She complains of muscular chest pain, related to her fall previously. In the last 24 hours, she has not required any oxygen whatsoever to maintain saturations. She is now stable to be discharged back to [facility].  Procedures:  None.  Consultations:  None.  Discharge Exam: Filed Vitals:   03/17/12 1007  BP: 104/56  Pulse: 60  Temp: 98.3 F (36.8 C)  Resp: 18   Filed Vitals:   03/16/12 1902 03/16/12 2057 03/17/12 0519 03/17/12 1007  BP: 103/49 116/65 144/68 104/56  Pulse: 62 63 62 60  Temp: 98.1 F (36.7 C) 98.2 F (36.8 C) 98.1 F (36.7 C) 98.3 F (36.8 C)  TempSrc: Oral Oral Oral   Resp: 18 17 18 18   Height:      Weight:   69.6 kg (153 lb 7 oz)   SpO2: 90% 92% 92% 91%   General: She looks frail. Chronically sick. Cardiovascular: Heart sounds are present and appear to  be in sinus rhythm today. Jugular venous pressure not raised. No peripheral pitting edema. Respiratory: Lung fields are clear today. No crackles, no wheezes. She is alert and orientated.  Discharge Instructions  Discharge Orders    Future Orders Please Complete By Expires   Diet - low sodium heart healthy      Increase activity slowly        Medication List  As of 03/17/2012 11:43 AM   STOP taking these medications         pravastatin 20 MG tablet         TAKE these medications         amiodarone 200 MG tablet   Commonly known as: PACERONE   Take 200 mg by mouth daily.      brimonidine 0.1 % Soln   Commonly known as: ALPHAGAN P   Place 1 drop into both eyes 2 (two) times daily.      CALCIUM 600 + D PO   Take 1 tablet by mouth 2 (two) times daily.      dorzolamide 2 % ophthalmic solution   Commonly known as: TRUSOPT   Place 1 drop into both eyes 2 (two) times daily.      furosemide 80 MG tablet   Commonly known as: LASIX   Take 1 tablet (80 mg total) by mouth 2 (two) times daily.      gabapentin 300 MG capsule   Commonly known as: NEURONTIN   Take 300 mg by mouth 2 (two) times daily.  HYDROcodone-acetaminophen 5-325 MG per tablet   Commonly known as: NORCO   Take 1 tablet by mouth every 4 (four) hours as needed.      LUMIGAN 0.01 % Soln   Generic drug: bimatoprost   Apply 1 drop to eye at bedtime.      metoprolol 50 MG tablet   Commonly known as: LOPRESSOR   Take 50 mg by mouth every morning.      simvastatin 20 MG tablet   Commonly known as: ZOCOR   Take 20 mg by mouth every evening.      trimethoprim 100 MG tablet   Commonly known as: TRIMPEX   Take 100 mg by mouth at bedtime.      warfarin 2 MG tablet   Commonly known as: COUMADIN   Take 1 mg by mouth as directed. Take one tablet (2mg ) on Sundays      warfarin 3 MG tablet   Commonly known as: COUMADIN   Take 3 mg by mouth as directed. Take one tablet (3mg ) on Mondays through Saturdays. Take  (2mg ) on Sundays only              The results of significant diagnostics from this hospitalization (including imaging, microbiology, ancillary and laboratory) are listed below for reference.    Significant Diagnostic Studies: Dg Chest 1 View  03/16/2012  *RADIOLOGY REPORT*  Clinical Data: Follow-up congestive heart failure.  CHEST - 1 VIEW  Comparison: 03/14/2012  Findings: Cardiomegaly stable.  Improved aeration of both lungs is seen.  Decreased interstitial infiltrates are seen, consistent with decreased interstitial edema.  No evidence of pulmonary consolidation or pleural effusion.  Transvenous pacemaker leads remain in appropriate position.  IMPRESSION:  1.  Decreased interstitial edema and improved aeration of both lungs since prior exam. 2.  Stable cardiomegaly.  Original Report Authenticated By: Danae Orleans, M.D.   Ct Chest Wo Contrast  03/15/2012  *RADIOLOGY REPORT*  Clinical Data: Chest pain, hypertension.  CT CHEST WITHOUT CONTRAST  Technique:  Multidetector CT imaging of the chest was performed following the standard protocol without IV contrast.  Comparison: 03/14/2012 radiograph, 08/10/2011 CT  Findings: Vasculature evaluation is limited in the absence of intravenous contrast.  Cardiomegaly. Pulmonary venous congestion. Left chest wall battery pack with lead tips within the right atrium and right ventricle.  Coronary artery calcification.  Mild valve calcifications.  Scattered atherosclerosis of the aorta and branch vessels.  Interlobular septal thickening. Patchy areas of ground-glass opacity.  More confluent airspace opacities within the lung bases bilaterally.  Central airways are patent.  No pneumothorax. No significant pleural effusion.  Limited images through the upper abdomen show no definite acute finding.  Advanced multilevel degenerative changes.  Status post vertebroplasty at L1.  Mild T5 vertebral body height loss is similar to prior.  Osteopenia.  No acute osseous  finding.  IMPRESSION: Cardiomegaly with vascular congestion and pulmonary edema pattern.  More confluent bibasilar opacities; edema, pneumonia, or aspiration.  Original Report Authenticated By: Waneta Martins, M.D.   Dg Chest Port 1 View  03/14/2012  *RADIOLOGY REPORT*  Clinical Data: Shortness of breath, cough and wheezing.  PORTABLE CHEST - 1 VIEW  Comparison: 09/24/2011 and CT chest 08/10/2011.  Findings: Trachea is midline.  Heart is mildly enlarged.  Pacemaker lead tips project over the right atrium and right ventricle. Thoracic aorta is calcified.  There is mild diffuse interstitial prominence.  No definite pleural fluid.  IMPRESSION: Mild pulmonary edema appears superimposed on probable underlying chronic lung disease.  Original Report Authenticated By: Reyes Ivan, M.D.    Microbiology: Recent Results (from the past 240 hour(s))  MRSA PCR SCREENING     Status: Normal   Collection Time   03/15/12  5:35 AM      Component Value Range Status Comment   MRSA by PCR NEGATIVE  NEGATIVE Final      Labs: Basic Metabolic Panel:  Lab 03/17/12 1610 03/16/12 0701 03/14/12 2043  NA 143 143 140  K 3.9 3.4* 3.6  CL 107 107 105  CO2 27 27 23   GLUCOSE 96 104* 166*  BUN 27* 22 29*  CREATININE 1.27* 1.21* 1.47*  CALCIUM 10.1 9.5 9.8  MG -- -- --  PHOS -- -- --   Liver Function Tests:  Lab 03/17/12 0446 03/15/12 0713 03/14/12 2043  AST 21 27 29   ALT 20 21 23   ALKPHOS 91 96 126*  BILITOT 0.5 0.5 0.3  PROT 6.7 6.4 6.9  ALBUMIN 3.1* 3.3* 3.5     CBC:  Lab 03/17/12 0446 03/16/12 0701 03/15/12 0713 03/14/12 2043  WBC 11.5* 11.5* 12.5* 12.3*  NEUTROABS -- -- -- 9.0*  HGB 11.9* 11.3* 11.6* 12.2  HCT 37.1 35.4* 36.6 37.9  MCV 86.1 85.7 85.9 84.4  PLT 215 189 203 225   Cardiac Enzymes:  Lab 03/15/12 2029 03/15/12 1334 03/15/12 0713 03/14/12 2254 03/14/12 2043  CKTOTAL 84 63 75 -- --  CKMB 1.3 1.5 1.6 -- --  CKMBINDEX -- -- -- -- --  TROPONINI <0.30 <0.30 <0.30 <0.30 <0.30     BNP: BNP (last 3 results)  Basename 03/14/12 2043 06/08/11 0437 06/07/11 0404  PROBNP 1320.0* 3887.0* 3835.0*      Time coordinating discharge: Greater than 30 minutes.  SignedWilson Singer  [facility] 03/17/2012, 11:43 AM

## 2012-03-17 NOTE — Progress Notes (Signed)
ANTICOAGULATION CONSULT NOTE - Initial Consult  Pharmacy Consult for Coumadin Indication: atrial fibrillation  No Known Allergies  Patient Measurements: Height: 5\' 3"  (160 cm) Weight: 153 lb 7 oz (69.6 kg) IBW/kg (Calculated) : 52.4    Vital Signs: Temp: 98.3 F (36.8 C) (07/15 1007) Temp src: Oral (07/15 0519) BP: 104/56 mmHg (07/15 1007) Pulse Rate: 60  (07/15 1007)  Labs:  [name] 03/17/12 0446 03/16/12 0701 03/15/12 2029 03/15/12 1334 03/15/12 0836 03/15/12 0713 03/14/12 2043  HGB 11.9* 11.3* -- -- -- -- --  HCT 37.1 35.4* -- -- -- 36.6 --  PLT 215 189 -- -- -- 203 --  APTT -- -- -- -- -- -- 45*  LABPROT 24.8* 28.4* -- -- 35.0* -- --  INR 2.20* 2.62* -- -- 3.42* -- --  HEPARINUNFRC -- -- -- -- -- -- --  CREATININE 1.27* 1.21* -- -- -- -- 1.47*  CKTOTAL -- -- 84 63 -- 75 --  CKMB -- -- 1.3 1.5 -- 1.6 --  TROPONINI -- -- <0.30 <0.30 -- <0.30 --    Estimated Creatinine Clearance: 31.4 ml/min (by C-G formula based on Cr of 1.27).   Medical History: Past Medical History  Diagnosis Date  . Hypertension   . High cholesterol   . Arthritis   . Hypothyroidism   . Coronary artery disease   . Arrhythmia     pacemaker  . Glaucoma   . Pulmonary embolism 06/04/11    Medications:  Scheduled:     . amiodarone  200 mg Oral Daily  . aspirin EC  81 mg Oral Daily  . bimatoprost  1 drop Both Eyes QHS  . brimonidine  1 drop Both Eyes BID  . dorzolamide  1 drop Both Eyes BID  . furosemide  80 mg Intravenous BID  . gabapentin  300 mg Oral BID  . metoprolol succinate  50 mg Oral Daily  . potassium chloride  40 mEq Oral Once  . simvastatin  20 mg Oral QPM  . sodium chloride  3 mL Intravenous Q12H  . sodium chloride      . trimethoprim  100 mg Oral QHS  . warfarin  2 mg Oral ONCE-1800  . Warfarin - Pharmacist Dosing Inpatient   Does not apply q1800    Assessment: [name] on chronic warfarin 3 mg daily except 2 mg on Sundays.  INR elevated on admission, however now  within goal range after warfarin held x2 days.  No bleeding noted.   Goal of Therapy:  INR 2-3 Monitor platelets by anticoagulation protocol: Yes   Plan:  1) Coumadin 3 mg po today x1 2) INR daily 3) Consider decrease outpatient warfarin dose to 2mg  on 2 days/week at discharge with routine INR monitoring.   [name] 03/17/2012,10:23 AM

## 2012-06-18 ENCOUNTER — Emergency Department (HOSPITAL_COMMUNITY)
Admission: EM | Admit: 2012-06-18 | Discharge: 2012-06-19 | Disposition: A | Discharge status: Skilled Nursing Facility | Payer: Medicare Other | Attending: Emergency Medicine | Admitting: Emergency Medicine

## 2012-06-18 ENCOUNTER — Encounter (HOSPITAL_COMMUNITY): Payer: Self-pay | Admitting: *Deleted

## 2012-06-18 DIAGNOSIS — R109 Unspecified abdominal pain: Secondary | ICD-10-CM | POA: Insufficient documentation

## 2012-06-18 DIAGNOSIS — M549 Dorsalgia, unspecified: Secondary | ICD-10-CM | POA: Insufficient documentation

## 2012-06-18 DIAGNOSIS — M25519 Pain in unspecified shoulder: Secondary | ICD-10-CM | POA: Insufficient documentation

## 2012-06-18 DIAGNOSIS — E7800 Pure hypercholesterolemia, unspecified: Secondary | ICD-10-CM | POA: Insufficient documentation

## 2012-06-18 DIAGNOSIS — I2510 Atherosclerotic heart disease of native coronary artery without angina pectoris: Secondary | ICD-10-CM | POA: Insufficient documentation

## 2012-06-18 DIAGNOSIS — E039 Hypothyroidism, unspecified: Secondary | ICD-10-CM | POA: Insufficient documentation

## 2012-06-18 DIAGNOSIS — I10 Essential (primary) hypertension: Secondary | ICD-10-CM | POA: Insufficient documentation

## 2012-06-18 DIAGNOSIS — R110 Nausea: Secondary | ICD-10-CM | POA: Insufficient documentation

## 2012-06-18 DIAGNOSIS — H409 Unspecified glaucoma: Secondary | ICD-10-CM | POA: Insufficient documentation

## 2012-06-18 DIAGNOSIS — Z950 Presence of cardiac pacemaker: Secondary | ICD-10-CM | POA: Insufficient documentation

## 2012-06-18 MED ORDER — SODIUM CHLORIDE 0.9 % IV BOLUS (SEPSIS)
1000.0000 mL | Freq: Once | INTRAVENOUS | Status: AC
  Administered 2012-06-19: 1000 mL via INTRAVENOUS

## 2012-06-18 MED ORDER — HYDROMORPHONE HCL PF 1 MG/ML IJ SOLN
1.0000 mg | Freq: Once | INTRAMUSCULAR | Status: AC
  Administered 2012-06-19: 1 mg via INTRAVENOUS
  Filled 2012-06-18: qty 1

## 2012-06-18 NOTE — ED Notes (Signed)
Reports by EMS pt has been given 2 Vicodin. One at 1800 & the second 2230 w/ no relief  Pt requesting to be seen in the er.

## 2012-06-18 NOTE — ED Notes (Signed)
States that she has had back pain for a very long time, states that she has taken her pain medications tonight and is feeling some better, however, the pain is still present.  States she took a Vicodin prior to leaving [address] with EMS.

## 2012-06-18 NOTE — ED Notes (Signed)
Appears comfortable, family at bedside.  No acute distress noted.

## 2012-06-18 NOTE — ED Provider Notes (Signed)
History    This chart was scribed for Danielle Roller, MD, MD by Smitty Pluck. The patient was seen in room [facility]4 and the patient's care was started at 11:50PM.   CSN: [id number]  Arrival date & time 06/18/12  2313   None     Chief Complaint  Patient presents with  . Back Pain    (Consider location/radiation/quality/duration/timing/severity/associated sxs/prior treatment) Patient is a 78 y.o. female presenting with back pain. The history is provided by the patient and a relative. No language interpreter was used.  Back Pain  Pertinent negatives include no chest pain, no fever, no numbness, no headaches, no abdominal pain, no dysuria and no weakness.   MAMIE [name] is a 78 y.o. female who presents to the Emergency Department complaining of constant, moderate back pain onset today. Pt reports that she awoke with the pain. She denies back pain 1 day ago. She reports having flu vaccine 1 day ago. Pt reports nausea. Denies vomiting, dysuria and fever. Reports her urine was greenish-yellow today. She reports hx of back and hip pain. Pt reports having right shoulder pain. Pt denies injury and trauma. She has right sided abdominal pain.   This pain has been relatively persistent but definitely worse when she moves positions such as going from laying down to sitting up and worse with rotation of her back. She denies abdominal pain, vomiting and states that she does take anticoagulant therapy for history of pulmonary embolism. She has taken several doses of pain medications today without significant improvement of her pain.  Past Medical History  Diagnosis Date  . Hypertension   . High cholesterol   . Arthritis   . Hypothyroidism   . Coronary artery disease   . Arrhythmia     pacemaker  . Glaucoma(365)   . Pulmonary embolism 06/04/11    Past Surgical History  Procedure Date  . Total hip arthroplasty     right side  . Pacemaker insertion   . Appendectomy   . Abdominal hysterectomy       History reviewed. No pertinent family history.  History  Substance Use Topics  . Smoking status: Never Smoker   . Smokeless tobacco: Not on file  . Alcohol Use: No    OB History    Grav Para Term Preterm Abortions TAB SAB Ect Mult Living                  Review of Systems  Constitutional: Negative for fever and chills.  HENT: Negative for sore throat and neck pain.   Eyes: Negative for visual disturbance.  Respiratory: Negative for cough and shortness of breath.   Cardiovascular: Negative for chest pain.  Gastrointestinal: Positive for nausea. Negative for vomiting, abdominal pain and diarrhea.  Genitourinary: Negative for dysuria and frequency.  Musculoskeletal: Positive for back pain.  Skin: Negative for rash.  Neurological: Negative for weakness, numbness and headaches.  Hematological: Negative for adenopathy.  Psychiatric/Behavioral: Negative for behavioral problems.    Allergies  Review of patient's allergies indicates no known allergies.  Home Medications   Current Outpatient Rx  Name Route Sig Dispense Refill  . ALPRAZOLAM 0.5 MG PO TABS Oral Take 0.5 mg by mouth at bedtime as needed.    . AMIODARONE HCL 200 MG PO TABS Oral Take 200 mg by mouth daily.     Marland Kitchen BIMATOPROST 0.01 % OP SOLN Ophthalmic Apply 1 drop to eye at bedtime.    Marland Kitchen BRIMONIDINE TARTRATE 0.1 % OP SOLN Both  Eyes Place 1 drop into both eyes 2 (two) times daily.    Marland Kitchen CALCIUM 600 + D PO Oral Take 1 tablet by mouth 2 (two) times daily.    . DORZOLAMIDE HCL 2 % OP SOLN Both Eyes Place 1 drop into both eyes 2 (two) times daily.    . FUROSEMIDE 80 MG PO TABS Oral Take 1 tablet (80 mg total) by mouth 2 (two) times daily. 60 tablet 0  . GABAPENTIN 300 MG PO CAPS Oral Take 300 mg by mouth 2 (two) times daily.    Marland Kitchen HYDROCODONE-ACETAMINOPHEN 5-325 MG PO TABS Oral Take 1 tablet by mouth every 4 (four) hours as needed.    Marland Kitchen LEVOTHYROXINE SODIUM 100 MCG PO TABS Oral Take 100 mcg by mouth daily.    Marland Kitchen  METOPROLOL TARTRATE 50 MG PO TABS Oral Take 50 mg by mouth every morning.    Marland Kitchen NITROFURANTOIN MONOHYD MACRO 100 MG PO CAPS Oral Take 100 mg by mouth 2 (two) times daily.    Marland Kitchen ONDANSETRON HCL 4 MG PO TABS Oral Take 4 mg by mouth every 6 (six) hours.    Marland Kitchen POLYETHYLENE GLYCOL 3350 PO POWD Oral Take 17 g by mouth as needed.    Marland Kitchen PRAVASTATIN SODIUM 20 MG PO TABS Oral Take 20 mg by mouth daily.    Marland Kitchen TRIMETHOPRIM 100 MG PO TABS Oral Take 100 mg by mouth at bedtime.    . WARFARIN SODIUM 3 MG PO TABS Oral Take 3 mg by mouth as directed. Take one tablet (3mg ) on Mondays through Saturdays. Take (2mg ) on Sundays only    . NAPROXEN 500 MG PO TABS Oral Take 1 tablet (500 mg total) by mouth 2 (two) times daily with a meal. 30 tablet 0  . OXYCODONE-ACETAMINOPHEN 5-325 MG PO TABS Oral Take 1 tablet by mouth every 4 (four) hours as needed for pain. 20 tablet 0  . SIMVASTATIN 20 MG PO TABS Oral Take 20 mg by mouth every evening.    . WARFARIN SODIUM 2 MG PO TABS Oral Take 1 mg by mouth as directed. Take one tablet (2mg ) on Sundays      BP 188/107  Pulse 81  Temp 97.6 F (36.4 C) (Oral)  Resp 18  SpO2 95%  Physical Exam  Nursing note and vitals reviewed. Constitutional: She is oriented to person, place, and time. She appears well-developed and well-nourished. No distress.  HENT:  Head: Normocephalic and atraumatic.  Mouth/Throat: Mucous membranes are dry.  Eyes: Conjunctivae normal are normal.  Neck: Normal range of motion. Neck supple.  Cardiovascular: Normal rate, regular rhythm and normal heart sounds.   Pulmonary/Chest: Effort normal and breath sounds normal. No respiratory distress. She has no wheezes.  Abdominal: Soft. She exhibits no distension and no mass (no pulsating masses). There is no tenderness.  Musculoskeletal: Normal range of motion.       paraspinal tenderness  no spinal tenderness  Neurological: She is alert and oriented to person, place, and time.       The patient is able to move  all 4 extremities, she has normal strength and sensation in her bilateral legs while she is in a sitting position.  Skin: Skin is warm and dry.  Psychiatric: She has a normal mood and affect. Her behavior is normal.    ED Course  Procedures (including critical care time) DIAGNOSTIC STUDIES: Oxygen Saturation is 95% on room air, adequate by my interpretation.    COORDINATION OF CARE: 11:55 PM Discussed ED  treatment with pt     Labs Reviewed  PROTIME-INR - Abnormal; Notable for the following:    Prothrombin Time 24.9 (*)     INR 2.38 (*)     All other components within normal limits  URINALYSIS, ROUTINE W REFLEX MICROSCOPIC  URINE CULTURE   Ct Abdomen Pelvis Wo Contrast  06/19/2012  *RADIOLOGY REPORT*  Clinical Data: Back pain.  CT ABDOMEN AND PELVIS WITHOUT CONTRAST  Technique:  Multidetector CT imaging of the abdomen and pelvis was performed following the standard protocol without intravenous contrast.  Comparison: 06/11/2006  Findings: Scarring in the lung bases.  Cardiac enlargement.  The kidneys appear symmetrical in size and shape.  There is some focal atrophy or scarring in the upper pole of the right kidney. There is no pyelocaliectasis or ureterectasis.  No renal, ureteral, or bladder stones are visualized.  No bladder wall thickening.  The unenhanced appearance of the liver, spleen, gallbladder, adrenal glands, and retroperitoneal lymph nodes is unremarkable. Calcified and tortuous abdominal aorta without aneurysm.  Vascular calcifications throughout the mesenteric vessels.  Diffuse fatty atrophy of the pancreas.  The stomach, small bowel, and colon are not abnormally distended.  Diffusely stool filled colon.  No free air or free fluid in the abdomen.  Small umbilical hernia containing fat.  Pelvis:  Visualization of portions of the pelvis is obscured by streak artifact from right hip prosthesis.  The uterus is surgically absent.  No abnormal adnexal masses are visualized.  Diverticula in the sigmoid colon without visualized diverticulitis. The appendix is not identified.  Diffuse bone demineralization. Lumbar scoliosis with compression fractures of T12, L2, L3, and L4 vertebrae.  Kyphoplasty at T12.  Degenerative disc disease at multiple levels.  Since the previous CT of lumbar spine dated 05/14/2011, there is increased compression of the L4 vertebra.  IMPRESSION: No renal or ureteral stone or obstruction.  Fatty atrophy of the pancreas. Diffuse bone demineralization with multiple lumbar compression fractures.  L4 compression has increased since 05/14/2011.   Original Report Authenticated By: Marlon Pel, M.D.      1. Back pain       MDM  Though the patient does have some paraspinal tenderness I am concerned about the degree of pain that she is having given the exam findings and thus a CT scan of the abdomen and pelvis has been ordered to rule out other sources of back pain she says AAA and retroperitoneal hematomas. Vital signs reflecting mild hypertension which has improved significantly and is currently 147 over 80.  She has been given hydromorphone with some improvement of her pain and IV fluids for her dehydration. The urinalysis shows a normal specific gravity, no ketones and no infection.   According to family members the patient is nonambulatory at baseline and uses a wheelchair for transport. She is neurologically intact her lower extremities had a CT scan of her abdomen and pelvis does not show any signs of AAA.   Discharge Prescriptions include:   Naprosyn  Percocet  The pt was given opiate type medications while in the emergency dept.  The patient was instructed on the possible side effects and potential allergic reactions associated with said medications and agreed to their use.  I also instructed the patient not to perform certain activities after use of these medications such as driving a vehicle and performing child care.  They were  instructed not to ingest alcohol or other medications that may cause excessive sleepiness, tranquilizers or CNS depressant medications.  They  have expressed their understanding.  If the pt was given opiate medications for home by prescription they were reminded of these precautions as well.   I personally performed the services described in this documentation, which was scribed in my presence. The recorded information has been reviewed and considered.        Danielle Roller, MD 06/19/12 [phone]

## 2012-06-19 ENCOUNTER — Emergency Department (HOSPITAL_COMMUNITY): Payer: Medicare Other

## 2012-06-19 ENCOUNTER — Encounter (HOSPITAL_COMMUNITY): Payer: Self-pay | Admitting: Emergency Medicine

## 2012-06-19 LAB — URINALYSIS, ROUTINE W REFLEX MICROSCOPIC
Bilirubin Urine: NEGATIVE
Leukocytes, UA: NEGATIVE
Nitrite: NEGATIVE
Specific Gravity, Urine: 1.015 (ref 1.005–1.030)
Urobilinogen, UA: 0.2 mg/dL (ref 0.0–1.0)
pH: 7.5 (ref 5.0–8.0)

## 2012-06-19 LAB — PROTIME-INR: Prothrombin Time: 24.9 seconds — ABNORMAL HIGH (ref 11.6–15.2)

## 2012-06-19 MED ORDER — NAPROXEN 500 MG PO TABS: 500.0000 mg | ORAL_TABLET | Freq: Two times a day (BID) | ORAL | Status: DC

## 2012-06-19 MED ORDER — OXYCODONE-ACETAMINOPHEN 5-325 MG PO TABS: 1.0000 | ORAL_TABLET | ORAL | Status: DC | PRN

## 2012-06-19 MED ORDER — METHOCARBAMOL 500 MG PO TABS: 500.0000 mg | ORAL_TABLET | Freq: Two times a day (BID) | ORAL | Status: DC

## 2012-06-19 NOTE — ED Notes (Signed)
Patient complaining of back pain. States "I have back pain all the time but it has been hurting worse since I got up this morning." Patient states pain medication given at facility is not helping. Family at bedside.

## 2012-06-19 NOTE — ED Notes (Signed)
Patient lying in bed sleeping in and out at this time; however, states medication did not help pain at all.

## 2012-06-26 LAB — URINE CULTURE

## 2012-06-28 ENCOUNTER — Emergency Department (HOSPITAL_COMMUNITY): Payer: Medicare Other

## 2012-06-28 ENCOUNTER — Emergency Department (HOSPITAL_COMMUNITY)
Admission: EM | Admit: 2012-06-28 | Discharge: 2012-06-28 | Payer: Medicare Other | Attending: Emergency Medicine | Admitting: Emergency Medicine

## 2012-06-28 ENCOUNTER — Encounter (HOSPITAL_COMMUNITY): Payer: Self-pay | Admitting: Emergency Medicine

## 2012-06-28 DIAGNOSIS — E7800 Pure hypercholesterolemia, unspecified: Secondary | ICD-10-CM | POA: Insufficient documentation

## 2012-06-28 DIAGNOSIS — Z79899 Other long term (current) drug therapy: Secondary | ICD-10-CM | POA: Insufficient documentation

## 2012-06-28 DIAGNOSIS — Y939 Activity, unspecified: Secondary | ICD-10-CM | POA: Insufficient documentation

## 2012-06-28 DIAGNOSIS — E039 Hypothyroidism, unspecified: Secondary | ICD-10-CM | POA: Insufficient documentation

## 2012-06-28 DIAGNOSIS — Z86711 Personal history of pulmonary embolism: Secondary | ICD-10-CM | POA: Insufficient documentation

## 2012-06-28 DIAGNOSIS — Z7901 Long term (current) use of anticoagulants: Secondary | ICD-10-CM | POA: Insufficient documentation

## 2012-06-28 DIAGNOSIS — H4050X0 Glaucoma secondary to other eye disorders, unspecified eye, stage unspecified: Secondary | ICD-10-CM | POA: Insufficient documentation

## 2012-06-28 DIAGNOSIS — I2510 Atherosclerotic heart disease of native coronary artery without angina pectoris: Secondary | ICD-10-CM | POA: Insufficient documentation

## 2012-06-28 DIAGNOSIS — S298XXA Other specified injuries of thorax, initial encounter: Secondary | ICD-10-CM | POA: Insufficient documentation

## 2012-06-28 DIAGNOSIS — I498 Other specified cardiac arrhythmias: Secondary | ICD-10-CM | POA: Insufficient documentation

## 2012-06-28 DIAGNOSIS — M129 Arthropathy, unspecified: Secondary | ICD-10-CM | POA: Insufficient documentation

## 2012-06-28 DIAGNOSIS — Y929 Unspecified place or not applicable: Secondary | ICD-10-CM | POA: Insufficient documentation

## 2012-06-28 DIAGNOSIS — Z791 Long term (current) use of non-steroidal anti-inflammatories (NSAID): Secondary | ICD-10-CM | POA: Insufficient documentation

## 2012-06-28 DIAGNOSIS — I10 Essential (primary) hypertension: Secondary | ICD-10-CM | POA: Insufficient documentation

## 2012-06-28 DIAGNOSIS — S2239XA Fracture of one rib, unspecified side, initial encounter for closed fracture: Secondary | ICD-10-CM

## 2012-06-28 DIAGNOSIS — X58XXXA Exposure to other specified factors, initial encounter: Secondary | ICD-10-CM | POA: Insufficient documentation

## 2012-06-28 MED ORDER — HYDROMORPHONE HCL PF 1 MG/ML IJ SOLN
1.0000 mg | Freq: Once | INTRAMUSCULAR | Status: AC
  Administered 2012-06-28: 1 mg via INTRAMUSCULAR
  Filled 2012-06-28: qty 1

## 2012-06-28 MED ORDER — OXYCODONE-ACETAMINOPHEN 5-325 MG PO TABS: 1.0000 | ORAL_TABLET | Freq: Four times a day (QID) | ORAL | Status: AC | PRN

## 2012-06-28 MED ORDER — OXYCODONE-ACETAMINOPHEN 5-325 MG PO TABS
1.0000 | ORAL_TABLET | Freq: Once | ORAL | Status: AC
  Administered 2012-06-28: 1 via ORAL
  Filled 2012-06-28: qty 1

## 2012-06-28 NOTE — ED Notes (Addendum)
Called [facility] to get more detailed report.  RN states patient has been declining for last week.  Tonight BP 210/90 and c/o pain.  Called EMS  RN reports patient had Vicodin 1 tablet at 7:50 am 12 Noon  4:00PM and 8:45pm today.

## 2012-06-28 NOTE — ED Notes (Signed)
Patient also states she has not felt well since she had her FLU shot on or about two weeks ago

## 2012-06-28 NOTE — ED Notes (Signed)
Pain in chest under right breast - both sides of her chest hurt - worse with movement.

## 2012-06-28 NOTE — ED Notes (Signed)
Discharge instructions and 6 pak of Percocet given to patients son for transfer back to [facility].  Report called to [facility].  Advised better to send patient back by EMS due to patients fractured ribs, difficulty getting patient out of a vehicle.

## 2012-06-28 NOTE — ED Provider Notes (Signed)
History   This chart was scribed for Danielle Lennert, MD by Albertha Ghee Rifaie. This patient was seen in room APA05/APA05 and the patient's care was started at 9:30 PM.   CSN: [id number]  Arrival date & time 06/28/12  2120   First MD Initiated Contact with Patient 06/28/12 2130      Chief Complaint  Patient presents with  . Chest Pain    pain right chest, sore mouth    Patient is a 78 y.o. female presenting with chest pain. The history is provided by the patient. No language interpreter was used.  Chest Pain The chest pain began 3 - 5 days ago. The quality of the pain is described as aching. Radiates to: RUQ. Exacerbated by: movement  Primary symptoms include shortness of breath and nausea. Pertinent negatives for primary symptoms include no fatigue, no cough, no palpitations and no abdominal pain.  The shortness of breath is mild. The patient's medical history does not include CHF or asthma.  Her past medical history is significant for arrhythmia, hypertension and pacemaker.  Pertinent negatives for past medical history include no seizures.     Pt denies smoking and alcohol use.  Past Medical History  Diagnosis Date  . Hypertension   . High cholesterol   . Arthritis   . Hypothyroidism   . Coronary artery disease   . Arrhythmia     pacemaker  . Glaucoma(365)   . Pulmonary embolism 06/04/11    Past Surgical History  Procedure Date  . Total hip arthroplasty     right side  . Pacemaker insertion   . Appendectomy   . Abdominal hysterectomy     No family history on file.  History  Substance Use Topics  . Smoking status: Never Smoker   . Smokeless tobacco: Not on file  . Alcohol Use: No    No OB history provided.   Review of Systems  Constitutional: Negative for fatigue.  HENT: Negative for congestion, sinus pressure and ear discharge.   Eyes: Negative for discharge.  Respiratory: Positive for shortness of breath. Negative for cough.   Cardiovascular: Positive  for chest pain. Negative for palpitations.  Gastrointestinal: Positive for nausea. Negative for abdominal pain and diarrhea.  Genitourinary: Negative for frequency and hematuria.  Musculoskeletal: Negative for back pain.  Skin: Negative for rash.  Neurological: Negative for seizures and headaches.  Hematological: Negative.   Psychiatric/Behavioral: Negative for hallucinations.    Allergies  Review of patient's allergies indicates no known allergies.  Home Medications   Current Outpatient Rx  Name Route Sig Dispense Refill  . ALPRAZOLAM 0.5 MG PO TABS Oral Take 0.5 mg by mouth at bedtime as needed.    . AMIODARONE HCL 200 MG PO TABS Oral Take 200 mg by mouth daily.     Marland Kitchen BIMATOPROST 0.01 % OP SOLN Ophthalmic Apply 1 drop to eye at bedtime.    Marland Kitchen BRIMONIDINE TARTRATE 0.1 % OP SOLN Both Eyes Place 1 drop into both eyes 2 (two) times daily.    Marland Kitchen CALCIUM 600 + D PO Oral Take 1 tablet by mouth 2 (two) times daily.    . DORZOLAMIDE HCL 2 % OP SOLN Both Eyes Place 1 drop into both eyes 2 (two) times daily.    . FUROSEMIDE 80 MG PO TABS Oral Take 1 tablet (80 mg total) by mouth 2 (two) times daily. 60 tablet 0  . GABAPENTIN 300 MG PO CAPS Oral Take 300 mg by mouth 2 (two) times daily.    Marland Kitchen  HYDROCODONE-ACETAMINOPHEN 5-325 MG PO TABS Oral Take 1 tablet by mouth every 4 (four) hours as needed.    Marland Kitchen LEVOTHYROXINE SODIUM 100 MCG PO TABS Oral Take 100 mcg by mouth daily.    Marland Kitchen METHOCARBAMOL 500 MG PO TABS Oral Take 1 tablet (500 mg total) by mouth 2 (two) times daily. 20 tablet 0  . METOPROLOL TARTRATE 50 MG PO TABS Oral Take 50 mg by mouth every morning.    Marland Kitchen NAPROXEN 500 MG PO TABS Oral Take 1 tablet (500 mg total) by mouth 2 (two) times daily with a meal. 30 tablet 0  . NITROFURANTOIN MONOHYD MACRO 100 MG PO CAPS Oral Take 100 mg by mouth 2 (two) times daily.    Marland Kitchen ONDANSETRON HCL 4 MG PO TABS Oral Take 4 mg by mouth every 6 (six) hours.    . OXYCODONE-ACETAMINOPHEN 5-325 MG PO TABS Oral Take 1  tablet by mouth every 4 (four) hours as needed for pain. 20 tablet 0  . POLYETHYLENE GLYCOL 3350 PO POWD Oral Take 17 g by mouth as needed.    Marland Kitchen PRAVASTATIN SODIUM 20 MG PO TABS Oral Take 20 mg by mouth daily.    Marland Kitchen SIMVASTATIN 20 MG PO TABS Oral Take 20 mg by mouth every evening.    Marland Kitchen TRIMETHOPRIM 100 MG PO TABS Oral Take 100 mg by mouth at bedtime.    . WARFARIN SODIUM 2 MG PO TABS Oral Take 1 mg by mouth as directed. Take one tablet (2mg ) on Sundays    . WARFARIN SODIUM 3 MG PO TABS Oral Take 3 mg by mouth as directed. Take one tablet (3mg ) on Mondays through Saturdays. Take (2mg ) on Sundays only      BP 175/78  Pulse 75  Temp 98 F (36.7 C) (Oral)  Resp 20  Ht 5\' 3"  (1.6 m)  Wt 149 lb (67.586 kg)  BMI 26.39 kg/m2  SpO2 96%  Physical Exam  Constitutional: She is oriented to person, place, and time. She appears well-developed.  HENT:  Head: Normocephalic and atraumatic.  Eyes: Conjunctivae normal and EOM are normal. No scleral icterus.  Neck: Neck supple. No thyromegaly present.  Cardiovascular: Normal rate and regular rhythm.  Exam reveals no gallop and no friction rub.   No murmur heard.      No palpitation   Pulmonary/Chest: No stridor. She has no wheezes. She has no rales. She exhibits no tenderness.       Tenderness to right lateral chest    Abdominal: She exhibits no distension. There is no tenderness. There is no rebound.  Musculoskeletal: Normal range of motion. She exhibits no edema.  Lymphadenopathy:    She has no cervical adenopathy.  Neurological: She is oriented to person, place, and time. Coordination normal.  Skin: No rash noted. No erythema.  Psychiatric: She has a normal mood and affect. Her behavior is normal.    ED Course  Procedures (including critical care time)  Labs Reviewed - No data to display No results found.  DIAGNOSTIC STUDIES: Oxygen Saturation is 96% on room air, adequate by my interpretation.    COORDINATION OF CARE: 9:49 PM Discussed  treatment plan with pt at bedside and pt agreed to plan.    No diagnosis found.   Date: 06/28/2012  Rate: 89  Rhythm: normal sinus rhythm  QRS Axis: normal  Intervals: normal  ST/T Wave abnormalities: normal  Conduction Disutrbances:none  Narrative Interpretation:   Old EKG Reviewed: none available    MDM  The chart was scribed for me under my direct supervision.  I personally performed the history, physical, and medical decision making and all procedures in the evaluation of this patient.Danielle Lennert, MD 06/28/12 2253

## 2012-06-30 MED FILL — Oxycodone w/ Acetaminophen Tab 5-325 MG: ORAL | Qty: 6 | Status: AC

## 2012-08-25 ENCOUNTER — Emergency Department (HOSPITAL_COMMUNITY): Payer: Medicare Other

## 2012-08-25 ENCOUNTER — Emergency Department (HOSPITAL_COMMUNITY)
Admission: EM | Admit: 2012-08-25 | Discharge: 2012-08-25 | Disposition: A | Discharge status: Home / Self Care | Payer: Medicare Other | Attending: Emergency Medicine | Admitting: Emergency Medicine

## 2012-08-25 ENCOUNTER — Encounter (HOSPITAL_COMMUNITY): Payer: Self-pay | Admitting: *Deleted

## 2012-08-25 DIAGNOSIS — K5900 Constipation, unspecified: Secondary | ICD-10-CM | POA: Insufficient documentation

## 2012-08-25 DIAGNOSIS — I517 Cardiomegaly: Secondary | ICD-10-CM | POA: Insufficient documentation

## 2012-08-25 DIAGNOSIS — I2510 Atherosclerotic heart disease of native coronary artery without angina pectoris: Secondary | ICD-10-CM | POA: Insufficient documentation

## 2012-08-25 DIAGNOSIS — Z8739 Personal history of other diseases of the musculoskeletal system and connective tissue: Secondary | ICD-10-CM | POA: Insufficient documentation

## 2012-08-25 DIAGNOSIS — M810 Age-related osteoporosis without current pathological fracture: Secondary | ICD-10-CM | POA: Insufficient documentation

## 2012-08-25 DIAGNOSIS — Z7901 Long term (current) use of anticoagulants: Secondary | ICD-10-CM | POA: Insufficient documentation

## 2012-08-25 DIAGNOSIS — Z86711 Personal history of pulmonary embolism: Secondary | ICD-10-CM | POA: Insufficient documentation

## 2012-08-25 DIAGNOSIS — E7800 Pure hypercholesterolemia, unspecified: Secondary | ICD-10-CM | POA: Insufficient documentation

## 2012-08-25 DIAGNOSIS — Z90710 Acquired absence of both cervix and uterus: Secondary | ICD-10-CM | POA: Insufficient documentation

## 2012-08-25 DIAGNOSIS — Z79899 Other long term (current) drug therapy: Secondary | ICD-10-CM | POA: Insufficient documentation

## 2012-08-25 DIAGNOSIS — R109 Unspecified abdominal pain: Secondary | ICD-10-CM | POA: Insufficient documentation

## 2012-08-25 DIAGNOSIS — R609 Edema, unspecified: Secondary | ICD-10-CM | POA: Insufficient documentation

## 2012-08-25 DIAGNOSIS — H409 Unspecified glaucoma: Secondary | ICD-10-CM | POA: Insufficient documentation

## 2012-08-25 DIAGNOSIS — E039 Hypothyroidism, unspecified: Secondary | ICD-10-CM | POA: Insufficient documentation

## 2012-08-25 DIAGNOSIS — I4891 Unspecified atrial fibrillation: Secondary | ICD-10-CM | POA: Insufficient documentation

## 2012-08-25 DIAGNOSIS — I10 Essential (primary) hypertension: Secondary | ICD-10-CM | POA: Insufficient documentation

## 2012-08-25 DIAGNOSIS — S22009A Unspecified fracture of unspecified thoracic vertebra, initial encounter for closed fracture: Secondary | ICD-10-CM

## 2012-08-25 HISTORY — DX: Unspecified atrial fibrillation: I48.91

## 2012-08-25 HISTORY — DX: Other osteoporosis without current pathological fracture: M81.8

## 2012-08-25 HISTORY — DX: Adverse effect of antigonadotrophins, antiestrogens, antiandrogens, not elsewhere classified, initial encounter: T38.6X5A

## 2012-08-25 LAB — CBC WITH DIFFERENTIAL/PLATELET
Basophils Absolute: 0 10*3/uL (ref 0.0–0.1)
Basophils Relative: 0 % (ref 0–1)
Lymphocytes Relative: 16 % (ref 12–46)
MCHC: 32.4 g/dL (ref 30.0–36.0)
Neutro Abs: 6.6 10*3/uL (ref 1.7–7.7)
Neutrophils Relative %: 70 % (ref 43–77)
Platelets: 273 10*3/uL (ref 150–400)
RDW: 16.3 % — ABNORMAL HIGH (ref 11.5–15.5)
WBC: 9.4 10*3/uL (ref 4.0–10.5)

## 2012-08-25 LAB — TROPONIN I: Troponin I: <0.3 ng/mL (ref <0.30)

## 2012-08-25 LAB — URINALYSIS, ROUTINE W REFLEX MICROSCOPIC
Bilirubin Urine: NEGATIVE
Hgb urine dipstick: NEGATIVE
Ketones, ur: NEGATIVE mg/dL
Leukocytes, UA: NEGATIVE
Nitrite: NEGATIVE
Protein, ur: NEGATIVE mg/dL
Specific Gravity, Urine: 1.01 (ref 1.005–1.030)
Urobilinogen, UA: 0.2 mg/dL (ref 0.0–1.0)
pH: 7 (ref 5.0–8.0)

## 2012-08-25 LAB — COMPREHENSIVE METABOLIC PANEL
ALT: 20 U/L (ref 0–35)
Albumin: 3.7 g/dL (ref 3.5–5.2)
Alkaline Phosphatase: 87 U/L (ref 39–117)
Chloride: 98 mEq/L (ref 96–112)
Glucose, Bld: 103 mg/dL — ABNORMAL HIGH (ref 70–99)
Potassium: 3.9 mEq/L (ref 3.5–5.1)
Sodium: 136 mEq/L (ref 135–145)
Total Bilirubin: 0.3 mg/dL (ref 0.3–1.2)
Total Protein: 7.3 g/dL (ref 6.0–8.3)

## 2012-08-25 MED ORDER — ONDANSETRON HCL 4 MG/2ML IJ SOLN
4.0000 mg | Freq: Once | INTRAMUSCULAR | Status: AC
  Administered 2012-08-25: 4 mg via INTRAVENOUS
  Filled 2012-08-25: qty 2

## 2012-08-25 MED ORDER — OXYCODONE-ACETAMINOPHEN 5-325 MG PO TABS
1.0000 | ORAL_TABLET | Freq: Once | ORAL | Status: AC
  Administered 2012-08-25: 1 via ORAL
  Filled 2012-08-25 (×2): qty 1

## 2012-08-25 MED ORDER — SODIUM CHLORIDE 0.9 % IV SOLN
1000.0000 mL | INTRAVENOUS | Status: DC
  Administered 2012-08-25: 1000 mL via INTRAVENOUS

## 2012-08-25 MED ORDER — MORPHINE SULFATE 4 MG/ML IJ SOLN
4.0000 mg | Freq: Once | INTRAMUSCULAR | Status: AC
  Administered 2012-08-25: 4 mg via INTRAVENOUS
  Filled 2012-08-25: qty 1

## 2012-08-25 MED ORDER — OXYCODONE-ACETAMINOPHEN 5-325 MG PO TABS: ORAL_TABLET | ORAL | Status: DC

## 2012-08-25 NOTE — ED Notes (Signed)
Low back pain and abd pain,  Pt is from [facility]. No vomiting, Nausea earlier today , not now.

## 2012-08-25 NOTE — ED Notes (Signed)
Patient assisted with bedpan.

## 2012-08-25 NOTE — ED Provider Notes (Signed)
History    CSN: [id number] Arrival date & time 08/25/12  1137 First MD Initiated Contact with Patient 08/25/12 1309     Chief Complaint  Patient presents with  . Abdominal Pain   HPI Pt has history of trouble with osteoporosis and musculoskeletal pain in her ribs and back.  The back started bothering her again about a week and a half ago.  It is in both sides of her mid to lower back. The last 3-4 days it has gotten worse.  She feels like her stomach is swollen as well but she has not had any trouble with vomiting or diarrhea.  Some nausea and no constipation.  The pain does increase with movement and palpation.  It has become more severe so she came to the ED.  No CP or SOB although she had a spell of her chest bothering her this am.    She thinks she just got agitated.  It did not last long. Past Medical History  Diagnosis Date  . Hypertension   . High cholesterol   . Arthritis   . Hypothyroidism   . Arrhythmia     pacemaker  . Glaucoma(365)   . Pulmonary embolism 06/04/11  . Coronary artery disease   . Atrial fibrillation   . Osteoporosis due to aromatase inhibitor     Past Surgical History  Procedure Date  . Total hip arthroplasty     right side  . Pacemaker insertion   . Appendectomy   . Abdominal hysterectomy     History reviewed. No pertinent family history.  History  Substance Use Topics  . Smoking status: Never Smoker   . Smokeless tobacco: Not on file  . Alcohol Use: No    OB History    Grav Para Term Preterm Abortions TAB SAB Ect Mult Living                  Review of Systems  Constitutional: Negative for fever.  Respiratory: Negative for cough.   Gastrointestinal: Negative for blood in stool.  Genitourinary: Negative for dysuria.  Musculoskeletal:       Leg swelling  All other systems reviewed and are negative.    Allergies  Naproxen and Robaxin  Home Medications   Current Outpatient Rx  Name  Route  Sig  Dispense  Refill  .  ACETAMINOPHEN 325 MG PO TABS   Oral   Take 650 mg by mouth every 6 (six) hours as needed.         . ALPRAZOLAM 0.25 MG PO TABS   Oral   Take 0.25 mg by mouth daily as needed. Anxiety         . MAGIC MOUTHWASH   Oral   Take 10 mLs by mouth 4 (four) times daily as needed.         . AMIODARONE HCL 200 MG PO TABS   Oral   Take 200 mg by mouth daily.          Marland Kitchen BIMATOPROST 0.01 % OP SOLN   Ophthalmic   Apply 1 drop to eye at bedtime.         Marland Kitchen BRIMONIDINE TARTRATE 0.1 % OP SOLN   Both Eyes   Place 1 drop into both eyes 2 (two) times daily.         Marland Kitchen CALCIUM 600 + D PO   Oral   Take 1 tablet by mouth 2 (two) times daily.         . DORZOLAMIDE  HCL 2 % OP SOLN   Both Eyes   Place 1 drop into both eyes 2 (two) times daily.         . FUROSEMIDE 80 MG PO TABS   Oral   Take 1 tablet (80 mg total) by mouth 2 (two) times daily.   60 tablet   0   . GABAPENTIN 300 MG PO CAPS   Oral   Take 300 mg by mouth 2 (two) times daily.         Marland Kitchen HYDROCODONE-ACETAMINOPHEN 5-500 MG PO TABS   Oral   Take 1 tablet by mouth every 6 (six) hours as needed.         Marland Kitchen LEVOTHYROXINE SODIUM 100 MCG PO TABS   Oral   Take 100 mcg by mouth daily.         Marland Kitchen METOPROLOL TARTRATE 50 MG PO TABS   Oral   Take 50 mg by mouth every morning.         . OXYCODONE-ACETAMINOPHEN 5-325 MG PO TABS   Oral   Take 1 tablet by mouth every 4 (four) hours as needed for pain.   20 tablet   0   . POLYETHYLENE GLYCOL 3350 PO POWD   Oral   Take 17 g by mouth daily.          Marland Kitchen PRAVASTATIN SODIUM 20 MG PO TABS   Oral   Take 20 mg by mouth daily.         Marland Kitchen TRIMETHOPRIM 100 MG PO TABS   Oral   Take 100 mg by mouth at bedtime.         . WARFARIN SODIUM 2 MG PO TABS   Oral   Take 1-2 mg by mouth daily. Takes one tablet (2mg ) everyday except on Monday and Friday when patient takes half a tablet (1mg )           BP 108/54  Pulse 70  Temp 98.6 F (37 C) (Oral)  Resp 18  Ht 5'  3.5" (1.613 m)  Wt 142 lb (64.411 kg)  BMI 24.76 kg/m2  SpO2 97%  Physical Exam  Nursing note and vitals reviewed. Constitutional: No distress.       Frail elderly   HENT:  Head: Normocephalic and atraumatic.  Right Ear: External ear normal.  Left Ear: External ear normal.  Eyes: Conjunctivae normal are normal. Right eye exhibits no discharge. Left eye exhibits no discharge. No scleral icterus.  Neck: Neck supple. No tracheal deviation present.  Cardiovascular: Normal rate, regular rhythm and intact distal pulses.   Pulmonary/Chest: Effort normal and breath sounds normal. No stridor. No respiratory distress. She has no wheezes. She has no rales.  Abdominal: Soft. Bowel sounds are normal. She exhibits no distension. There is generalized tenderness. There is no rebound and no guarding.  Musculoskeletal: She exhibits edema (mild bilateral le). She exhibits no tenderness.       Thoracic back: She exhibits tenderness. She exhibits no swelling and no edema.       Lumbar back: She exhibits tenderness. She exhibits no swelling and no edema.  Neurological: She is alert. She has normal strength. No sensory deficit. Cranial nerve deficit:  no gross defecits noted. She exhibits normal muscle tone. She displays no seizure activity. Coordination normal.  Skin: Skin is warm and dry. No rash noted.  Psychiatric: She has a normal mood and affect.    ED Course  Procedures (including critical care time) EKG RAte 76 AV dual-paced rhythm  Abnormal ECG When compared with ECG of 15-Mar-2012 15:09, Electronic ventricular pacemaker has replaced Junctional rhythm Labs Reviewed  COMPREHENSIVE METABOLIC PANEL - Abnormal; Notable for the following:    Glucose, Bld 103 (*)     BUN 29 (*)     Creatinine, Ser 1.65 (*)     GFR calc non Af Amer 28 (*)     GFR calc Af Amer 32 (*)     All other components within normal limits  CBC WITH DIFFERENTIAL - Abnormal; Notable for the following:    RDW 16.3 (*)      Monocytes Absolute 1.2 (*)     All other components within normal limits  LIPASE, BLOOD  URINALYSIS, ROUTINE W REFLEX MICROSCOPIC  TROPONIN I   Dg Abd Acute W/chest  08/25/2012  *RADIOLOGY REPORT*  Clinical Data: The abdominal pain and swelling.  ACUTE ABDOMEN SERIES (ABDOMEN 2 VIEW & CHEST 1 VIEW)  Comparison: Chest x-ray 06/28/2012.  Findings: Lung volumes are normal.  Interstitial prominence throughout the left lung.  No definite consolidative airspace disease.  Probable left lower lobe subsegmental atelectasis. Possible trace left pleural effusion.  Mild cephalization of the pulmonary vasculature, without frank pulmonary edema.  Mild cardiomegaly. The patient is rotated to the left on today's exam, resulting in distortion of the mediastinal contours and reduced diagnostic sensitivity and specificity for mediastinal pathology. Atherosclerosis in the thoracic aorta.  Left-sided pacemaker device in place with lead tips projecting in the expected location of the right atrium and right ventricular apex.  Supine and upright views of the abdomen demonstrate gas and stool scattered throughout the colon extending to the level of the distal rectum.  No definite pathologic distension of small bowel is noted. No pneumoperitoneum.  Extensive vascular calcifications are noted. Post procedural changes of vertebroplasty are noted in the upper lumbar spine.  Right hip hemiarthroplasty is incidentally noted and incompletely visualized.  IMPRESSION: 1.  Nonobstructive bowel gas pattern. 2.  No pneumoperitoneum. 3.  Diffuse interstitial prominence throughout the left lung is of uncertain etiology and significance, but clinical correlation for signs and symptoms of bronchopneumonia is recommended. 4.  Mild cardiomegaly with pulmonary venous congestion, but no frank pulmonary edema. 5.  Atherosclerosis.   Original Report Authenticated By: Trudie Reed, M.D.       MDM  No definitive cause for her pain based on initial  findings.  Will plan on CT scan for further evaluation.  Case will be turned to Dr Jodelle Gross Roselind Messier, MD 08/25/12 [phone]

## 2012-08-25 NOTE — ED Provider Notes (Signed)
This chart was scribed for Ward Givens, MD, MD by Smitty Pluck, ED Scribe. The patient was seen in room APA10 and the patient's care was started at 6:07PM.  Pt left with me at change of shift by Dr Roselyn Bering to get her AP CT results.   Pt reports that she has moderate abdominal pain. She mentions that she takes pain medication and has been constipated. Pt reports that she takes Miralax once a day without relief. She reports that her last bowel movement was 2 days ago. Pt also states she has back pain. When I put my hand under her back she states when I am close to the  Discussed pt imaging results with pt. And have decided to order DG thoracic spine 2 view.   Ct Abdomen Pelvis Wo Contrast  08/25/2012  *RADIOLOGY REPORT*  Clinical Data: Abdominal pain  CT ABDOMEN AND PELVIS WITHOUT CONTRAST  Technique:  Multidetector CT imaging of the abdomen and pelvis was performed following the standard protocol without intravenous contrast.  Comparison: 06/19/2012  Findings: Chronic changes at the lung bases.  Pectus excavatum. Pacemaker device in place.  No hydronephrosis.  No evidence of urinary calculus.  Unenhanced liver, gallbladder, spleen, adrenal glands are stable. Pancreas remains atrophic. Gallbladder sludge.  Stomach is moderately distended.  Prominent stool burden throughout the colon.  No disproportionate dilatation of small bowel.  Umbilical hernia contains adipose tissue.  Right total hip arthroplasty again noted.  Bladder is mildly distended.  New T9 compression fracture is partially imaged.  There is some retropulsion of the inferior endplate.  Otherwise, stable compression deformities of the lumbar spine and lower thoracic spine.  IMPRESSION: No evidence of urinary calculus.  New T9 compression fracture is partially imaged.  There is some retropulsion of the inferior endplate.   Original Report Authenticated By: Jolaine Click, M.D.    Dg Thoracic Spine W/swimmers  08/25/2012  *RADIOLOGY REPORT*   Clinical Data: Chronic mid to upper back pain.  No injury.  THORACIC SPINE - 2 VIEW + SWIMMERS  Comparison: Prior CT chest 03/15/2012.  Findings: Methylmethacrylate is seen in the T12 vertebral body from previous vertebral augmentation. Since the previous CT, there is a new T9 compression fracture which was not present previously.  Its age however is indeterminate based on this radiographic examination.  Slight retropulsion is suggested of the inferior endplate.  Dual lead transvenous pacemaker from a  left subclavian approach. Skeletal osteopenia.  Advanced vascular calcification.  IMPRESSION: Indeterminate age T9 compression fracture with slight retropulsion. This compression fracture is new however when compared with July 2013.   Original Report Authenticated By: Davonna Belling, M.D.    , Diagnoses that have been ruled out:  None  Diagnoses that are still under consideration:  None  Final diagnoses:  Abdominal pain  Constipation  Thoracic spine fracture   New Prescriptions   OXYCODONE-ACETAMINOPHEN (PERCOCET/ROXICET) 5-325 MG PER TABLET    Take 1 or 2 po Q 6hrs for pain    Plan discharge  Devoria Albe, MD, FACEP   I personally performed the services described in this documentation, which was scribed in my presence. The recorded information has been reviewed and considered.  Devoria Albe, MD, FACEP      Ward Givens, MD 08/25/12 [phone]

## 2012-08-25 NOTE — ED Notes (Signed)
Patient does not need anything at this time. 

## 2012-08-25 NOTE — ED Notes (Signed)
Patient would like to know how much longer till discharge.

## 2012-09-10 ENCOUNTER — Encounter (HOSPITAL_COMMUNITY): Payer: Self-pay

## 2012-09-10 ENCOUNTER — Emergency Department (HOSPITAL_COMMUNITY): Payer: Medicare Other

## 2012-09-10 ENCOUNTER — Inpatient Hospital Stay (HOSPITAL_COMMUNITY)
Admission: EM | Admit: 2012-09-10 | Discharge: 2012-09-12 | DRG: 193 | Disposition: A | Discharge status: Skilled Nursing Facility | Payer: Medicare Other | Attending: Internal Medicine | Admitting: Internal Medicine

## 2012-09-10 DIAGNOSIS — E039 Hypothyroidism, unspecified: Secondary | ICD-10-CM | POA: Diagnosis present

## 2012-09-10 DIAGNOSIS — I509 Heart failure, unspecified: Secondary | ICD-10-CM | POA: Diagnosis present

## 2012-09-10 DIAGNOSIS — E7800 Pure hypercholesterolemia, unspecified: Secondary | ICD-10-CM | POA: Diagnosis present

## 2012-09-10 DIAGNOSIS — Z7901 Long term (current) use of anticoagulants: Secondary | ICD-10-CM

## 2012-09-10 DIAGNOSIS — J9600 Acute respiratory failure, unspecified whether with hypoxia or hypercapnia: Secondary | ICD-10-CM

## 2012-09-10 DIAGNOSIS — I480 Paroxysmal atrial fibrillation: Secondary | ICD-10-CM

## 2012-09-10 DIAGNOSIS — I5031 Acute diastolic (congestive) heart failure: Secondary | ICD-10-CM | POA: Diagnosis present

## 2012-09-10 DIAGNOSIS — M818 Other osteoporosis without current pathological fracture: Secondary | ICD-10-CM | POA: Diagnosis present

## 2012-09-10 DIAGNOSIS — M48061 Spinal stenosis, lumbar region without neurogenic claudication: Secondary | ICD-10-CM

## 2012-09-10 DIAGNOSIS — F411 Generalized anxiety disorder: Secondary | ICD-10-CM | POA: Diagnosis present

## 2012-09-10 DIAGNOSIS — M129 Arthropathy, unspecified: Secondary | ICD-10-CM | POA: Diagnosis present

## 2012-09-10 DIAGNOSIS — E876 Hypokalemia: Secondary | ICD-10-CM | POA: Diagnosis present

## 2012-09-10 DIAGNOSIS — Z950 Presence of cardiac pacemaker: Secondary | ICD-10-CM

## 2012-09-10 DIAGNOSIS — S32000A Wedge compression fracture of unspecified lumbar vertebra, initial encounter for closed fracture: Secondary | ICD-10-CM

## 2012-09-10 DIAGNOSIS — Z87311 Personal history of (healed) other pathological fracture: Secondary | ICD-10-CM

## 2012-09-10 DIAGNOSIS — I4891 Unspecified atrial fibrillation: Secondary | ICD-10-CM | POA: Diagnosis present

## 2012-09-10 DIAGNOSIS — R079 Chest pain, unspecified: Secondary | ICD-10-CM

## 2012-09-10 DIAGNOSIS — M47817 Spondylosis without myelopathy or radiculopathy, lumbosacral region: Secondary | ICD-10-CM

## 2012-09-10 DIAGNOSIS — M5430 Sciatica, unspecified side: Secondary | ICD-10-CM

## 2012-09-10 DIAGNOSIS — S2231XA Fracture of one rib, right side, initial encounter for closed fracture: Secondary | ICD-10-CM

## 2012-09-10 DIAGNOSIS — R55 Syncope and collapse: Secondary | ICD-10-CM

## 2012-09-10 DIAGNOSIS — I495 Sick sinus syndrome: Secondary | ICD-10-CM | POA: Diagnosis present

## 2012-09-10 DIAGNOSIS — Z66 Do not resuscitate: Secondary | ICD-10-CM | POA: Diagnosis present

## 2012-09-10 DIAGNOSIS — T50995A Adverse effect of other drugs, medicaments and biological substances, initial encounter: Secondary | ICD-10-CM | POA: Diagnosis present

## 2012-09-10 DIAGNOSIS — Z79899 Other long term (current) drug therapy: Secondary | ICD-10-CM

## 2012-09-10 DIAGNOSIS — Z86711 Personal history of pulmonary embolism: Secondary | ICD-10-CM

## 2012-09-10 DIAGNOSIS — M8448XA Pathological fracture, other site, initial encounter for fracture: Secondary | ICD-10-CM | POA: Diagnosis present

## 2012-09-10 DIAGNOSIS — H409 Unspecified glaucoma: Secondary | ICD-10-CM | POA: Diagnosis present

## 2012-09-10 DIAGNOSIS — R739 Hyperglycemia, unspecified: Secondary | ICD-10-CM

## 2012-09-10 DIAGNOSIS — S82892A Other fracture of left lower leg, initial encounter for closed fracture: Secondary | ICD-10-CM

## 2012-09-10 DIAGNOSIS — J189 Pneumonia, unspecified organism: Secondary | ICD-10-CM | POA: Diagnosis present

## 2012-09-10 DIAGNOSIS — G894 Chronic pain syndrome: Secondary | ICD-10-CM

## 2012-09-10 DIAGNOSIS — I10 Essential (primary) hypertension: Secondary | ICD-10-CM | POA: Diagnosis present

## 2012-09-10 DIAGNOSIS — I2510 Atherosclerotic heart disease of native coronary artery without angina pectoris: Secondary | ICD-10-CM | POA: Diagnosis present

## 2012-09-10 HISTORY — DX: Anxiety disorder, unspecified: F41.9

## 2012-09-10 LAB — CBC WITH DIFFERENTIAL/PLATELET
Eosinophils Relative: 1 % (ref 0–5)
HCT: 39.5 % (ref 36.0–46.0)
Hemoglobin: 12.7 g/dL (ref 12.0–15.0)
Lymphocytes Relative: 6 % — ABNORMAL LOW (ref 12–46)
Lymphs Abs: 0.9 10*3/uL (ref 0.7–4.0)
MCV: 87.4 fL (ref 78.0–100.0)
Platelets: 335 10*3/uL (ref 150–400)
RBC: 4.52 MIL/uL (ref 3.87–5.11)
WBC: 14.7 10*3/uL — ABNORMAL HIGH (ref 4.0–10.5)

## 2012-09-10 LAB — PROTIME-INR: INR: 2.95 — ABNORMAL HIGH (ref 0.00–1.49)

## 2012-09-10 LAB — BASIC METABOLIC PANEL
CO2: 25 mEq/L (ref 19–32)
Calcium: 10 mg/dL (ref 8.4–10.5)
Glucose, Bld: 162 mg/dL — ABNORMAL HIGH (ref 70–99)
Sodium: 135 mEq/L (ref 135–145)

## 2012-09-10 LAB — INFLUENZA PANEL BY PCR (TYPE A & B)
H1N1 flu by pcr: NOT DETECTED
Influenza A By PCR: NEGATIVE

## 2012-09-10 LAB — MRSA PCR SCREENING: MRSA by PCR: NEGATIVE

## 2012-09-10 MED ORDER — WARFARIN SODIUM 1 MG PO TABS
1.0000 mg | ORAL_TABLET | Freq: Once | ORAL | Status: AC
  Administered 2012-09-10: 1 mg via ORAL
  Filled 2012-09-10 (×2): qty 1

## 2012-09-10 MED ORDER — TRIMETHOPRIM 100 MG PO TABS
100.0000 mg | ORAL_TABLET | Freq: Every day | ORAL | Status: DC
  Administered 2012-09-11: 100 mg via ORAL
  Filled 2012-09-10 (×2): qty 1

## 2012-09-10 MED ORDER — ACETAMINOPHEN 325 MG PO TABS: 650.0000 mg | ORAL_TABLET | Freq: Four times a day (QID) | ORAL | Status: DC | PRN

## 2012-09-10 MED ORDER — SODIUM CHLORIDE 0.9 % IV BOLUS (SEPSIS): 500.0000 mL | Freq: Once | INTRAVENOUS | Status: DC

## 2012-09-10 MED ORDER — DEXTROSE 5 % IV SOLN
2.0000 g | Freq: Once | INTRAVENOUS | Status: AC
  Administered 2012-09-10: 2 g via INTRAVENOUS
  Filled 2012-09-10: qty 2

## 2012-09-10 MED ORDER — LEVOFLOXACIN IN D5W 750 MG/150ML IV SOLN
INTRAVENOUS | Status: AC
  Administered 2012-09-10: 750 mg via INTRAVENOUS
  Filled 2012-09-10: qty 150

## 2012-09-10 MED ORDER — SODIUM CHLORIDE 0.9 % IV SOLN
Freq: Once | INTRAVENOUS | Status: AC
  Administered 2012-09-10: 999 mL via INTRAVENOUS

## 2012-09-10 MED ORDER — METOPROLOL TARTRATE 50 MG PO TABS: 50.0000 mg | ORAL_TABLET | ORAL | Status: DC

## 2012-09-10 MED ORDER — DORZOLAMIDE HCL 2 % OP SOLN
1.0000 [drp] | Freq: Two times a day (BID) | OPHTHALMIC | Status: DC
  Administered 2012-09-11: 1 [drp] via OPHTHALMIC
  Filled 2012-09-10: qty 10

## 2012-09-10 MED ORDER — HYDROCODONE-ACETAMINOPHEN 5-325 MG PO TABS
1.0000 | ORAL_TABLET | ORAL | Status: DC | PRN
  Administered 2012-09-10 – 2012-09-12 (×7): 1 via ORAL
  Filled 2012-09-10 (×7): qty 1

## 2012-09-10 MED ORDER — METOPROLOL TARTRATE 50 MG PO TABS
50.0000 mg | ORAL_TABLET | Freq: Every day | ORAL | Status: DC
  Administered 2012-09-11 – 2012-09-12 (×2): 50 mg via ORAL
  Filled 2012-09-10 (×2): qty 1

## 2012-09-10 MED ORDER — BRIMONIDINE TARTRATE 0.15 % OP SOLN
1.0000 [drp] | Freq: Two times a day (BID) | OPHTHALMIC | Status: DC
  Administered 2012-09-10 – 2012-09-12 (×4): 1 [drp] via OPHTHALMIC
  Filled 2012-09-10: qty 5

## 2012-09-10 MED ORDER — BIMATOPROST 0.01 % OP SOLN
OPHTHALMIC | Status: AC
  Filled 2012-09-10: qty 2.5

## 2012-09-10 MED ORDER — VANCOMYCIN HCL IN DEXTROSE 1-5 GM/200ML-% IV SOLN
1000.0000 mg | INTRAVENOUS | Status: DC
  Administered 2012-09-11: 1000 mg via INTRAVENOUS
  Filled 2012-09-10 (×4): qty 200

## 2012-09-10 MED ORDER — BRIMONIDINE TARTRATE 0.2 % OP SOLN
OPHTHALMIC | Status: AC
  Filled 2012-09-10: qty 5

## 2012-09-10 MED ORDER — FUROSEMIDE 80 MG PO TABS: 80.0000 mg | ORAL_TABLET | Freq: Two times a day (BID) | ORAL | Status: DC

## 2012-09-10 MED ORDER — FUROSEMIDE 10 MG/ML IJ SOLN
80.0000 mg | Freq: Two times a day (BID) | INTRAMUSCULAR | Status: DC
  Administered 2012-09-10: 80 mg via INTRAVENOUS
  Filled 2012-09-10 (×2): qty 8

## 2012-09-10 MED ORDER — CEFEPIME HCL 1 G IJ SOLR: 1.0000 g | Freq: Three times a day (TID) | INTRAMUSCULAR | Status: DC

## 2012-09-10 MED ORDER — SIMVASTATIN 10 MG PO TABS
10.0000 mg | ORAL_TABLET | Freq: Every day | ORAL | Status: DC
  Administered 2012-09-10 – 2012-09-11 (×2): 10 mg via ORAL
  Filled 2012-09-10 (×4): qty 1

## 2012-09-10 MED ORDER — SODIUM CHLORIDE 0.9 % IV SOLN: INTRAVENOUS | Status: DC

## 2012-09-10 MED ORDER — MAGIC MOUTHWASH: 10.0000 mL | Freq: Four times a day (QID) | ORAL | Status: DC | PRN

## 2012-09-10 MED ORDER — LEVOFLOXACIN IN D5W 750 MG/150ML IV SOLN: 750.0000 mg | INTRAVENOUS | Status: DC

## 2012-09-10 MED ORDER — LEVOTHYROXINE SODIUM 100 MCG PO TABS
100.0000 ug | ORAL_TABLET | Freq: Every day | ORAL | Status: DC
  Administered 2012-09-11 – 2012-09-12 (×2): 100 ug via ORAL
  Filled 2012-09-10 (×2): qty 1

## 2012-09-10 MED ORDER — WARFARIN - PHARMACIST DOSING INPATIENT
Freq: Every day | Status: DC
  Administered 2012-09-10: 18:00:00

## 2012-09-10 MED ORDER — BIMATOPROST 0.01 % OP SOLN
1.0000 [drp] | Freq: Every day | OPHTHALMIC | Status: DC
  Administered 2012-09-10 – 2012-09-11 (×2): 1 [drp] via OPHTHALMIC
  Filled 2012-09-10: qty 2.5

## 2012-09-10 MED ORDER — GABAPENTIN 300 MG PO CAPS
300.0000 mg | ORAL_CAPSULE | Freq: Three times a day (TID) | ORAL | Status: DC
  Administered 2012-09-10 – 2012-09-12 (×6): 300 mg via ORAL
  Filled 2012-09-10 (×6): qty 1

## 2012-09-10 MED ORDER — VANCOMYCIN HCL IN DEXTROSE 1-5 GM/200ML-% IV SOLN
1000.0000 mg | Freq: Once | INTRAVENOUS | Status: AC
  Administered 2012-09-10: 1000 mg via INTRAVENOUS
  Filled 2012-09-10: qty 200

## 2012-09-10 MED ORDER — POLYETHYLENE GLYCOL 3350 17 G PO PACK
17.0000 g | PACK | Freq: Every day | ORAL | Status: DC
  Administered 2012-09-12: 17 g via ORAL
  Filled 2012-09-10: qty 1

## 2012-09-10 MED ORDER — VANCOMYCIN HCL IN DEXTROSE 1-5 GM/200ML-% IV SOLN
INTRAVENOUS | Status: AC
  Filled 2012-09-10: qty 200

## 2012-09-10 MED ORDER — VANCOMYCIN HCL IN DEXTROSE 1-5 GM/200ML-% IV SOLN: 1000.0000 mg | INTRAVENOUS | Status: DC

## 2012-09-10 MED ORDER — AMIODARONE HCL 200 MG PO TABS
200.0000 mg | ORAL_TABLET | Freq: Every day | ORAL | Status: DC
  Administered 2012-09-11 – 2012-09-12 (×2): 200 mg via ORAL
  Filled 2012-09-10 (×2): qty 1

## 2012-09-10 MED ORDER — DEXTROSE 5 % IV SOLN
1.0000 g | INTRAVENOUS | Status: DC
  Administered 2012-09-11: 1 g via INTRAVENOUS
  Filled 2012-09-10 (×6): qty 1

## 2012-09-10 MED ORDER — LEVOFLOXACIN IN D5W 750 MG/150ML IV SOLN
750.0000 mg | INTRAVENOUS | Status: DC
  Filled 2012-09-10 (×2): qty 150

## 2012-09-10 MED ORDER — ALPRAZOLAM 0.25 MG PO TABS
0.2500 mg | ORAL_TABLET | Freq: Every day | ORAL | Status: DC | PRN
  Administered 2012-09-10 – 2012-09-11 (×2): 0.25 mg via ORAL
  Filled 2012-09-10 (×2): qty 1

## 2012-09-10 MED ORDER — DOCUSATE SODIUM 100 MG PO CAPS
100.0000 mg | ORAL_CAPSULE | Freq: Two times a day (BID) | ORAL | Status: DC
  Administered 2012-09-10 – 2012-09-12 (×4): 100 mg via ORAL
  Filled 2012-09-10 (×4): qty 1

## 2012-09-10 MED ORDER — ONDANSETRON HCL 4 MG PO TABS: 4.0000 mg | ORAL_TABLET | Freq: Four times a day (QID) | ORAL | Status: DC | PRN

## 2012-09-10 MED ORDER — LEVOFLOXACIN IN D5W 750 MG/150ML IV SOLN
750.0000 mg | INTRAVENOUS | Status: DC
  Administered 2012-09-10: 750 mg via INTRAVENOUS

## 2012-09-10 NOTE — ED Notes (Signed)
Waiting for pt's room to be cleaned in ICU stepdown.  Was told receiving Rn will call when room ready.

## 2012-09-10 NOTE — ED Notes (Signed)
Pt was incontinent of urine and had small BM, Cleaned pt and changed linens.

## 2012-09-10 NOTE — Progress Notes (Signed)
ANTICOAGULATION CONSULT NOTE - Initial Consult  Pharmacy Consult for Warfarin Indication: atrial fibrillation  Allergies  Allergen Reactions  . Naproxen     Patient on coumadin  . Robaxin (Methocarbamol)     Causes patient to be confused    Patient Measurements: Height: 5\' 3"  (160 cm) Weight: 143 lb (64.864 kg) IBW/kg (Calculated) : 52.4   Vital Signs: Temp: 97.8 F (36.6 C) (01/08 1535) Temp src: Oral (01/08 1535) BP: 139/66 mmHg (01/08 1634) Pulse Rate: 60  (01/08 1634)  Labs:  Basename 09/10/12 1514 09/10/12 1346  HGB -- 12.7  HCT -- 39.5  PLT -- 335  APTT -- --  LABPROT 29.2* --  INR 2.95* --  HEPARINUNFRC -- --  CREATININE -- 1.23*  CKTOTAL -- --  CKMB -- --  TROPONINI -- --    Estimated Creatinine Clearance: 31.4 ml/min (by C-G formula based on Cr of 1.23).   Medical History: Past Medical History  Diagnosis Date  . Hypertension   . High cholesterol   . Arthritis   . Hypothyroidism   . Arrhythmia     pacemaker  . Glaucoma(365)   . Pulmonary embolism 06/04/11  . Coronary artery disease   . Atrial fibrillation   . 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 Anxiety     Medications:   (Not in a hospital admission)  Assessment: 79yo female on chronic warfarin for afib.  INR at high end of range on admission. Goal of Therapy:  INR 2-3 Monitor platelets by anticoagulation protocol: Yes   Plan: Coumadin 1mg  today x 1 (to discourage overshoot) INR daily  Valrie Hart A 09/10/2012,5:25 PM

## 2012-09-10 NOTE — Progress Notes (Signed)
ANTIBIOTIC CONSULT NOTE - INITIAL  Pharmacy Consult for Vancomycin, Levaquin, Cefepime Indication: pneumonia  Allergies  Allergen Reactions  . Naproxen     Patient on coumadin  . Robaxin (Methocarbamol)     Causes patient to be confused   Patient Measurements: Height: 5\' 3"  (160 cm) Weight: 143 lb (64.864 kg) IBW/kg (Calculated) : 52.4   Vital Signs: Temp: 97.8 F (36.6 C) (01/08 1535) Temp src: Oral (01/08 1535) BP: 139/66 mmHg (01/08 1634) Pulse Rate: 60  (01/08 1634) Intake/Output from previous day:   Intake/Output from this shift:    Labs:  Basename 09/10/12 1346  WBC 14.7*  HGB 12.7  PLT 335  LABCREA --  CREATININE 1.23*   Estimated Creatinine Clearance: 31.4 ml/min (by C-G formula based on Cr of 1.23). No results found for this basename: VANCOTROUGH:2,VANCOPEAK:2,VANCORANDOM:2,GENTTROUGH:2,GENTPEAK:2,GENTRANDOM:2,TOBRATROUGH:2,TOBRAPEAK:2,TOBRARND:2,AMIKACINPEAK:2,AMIKACINTROU:2,AMIKACIN:2, in the last 72 hours   Microbiology: No results found for this or any previous visit (from the past 720 hour(s)).  Medical History: Past Medical History  Diagnosis Date  . Hypertension   . High cholesterol   . Arthritis   . Hypothyroidism   . Arrhythmia     pacemaker  . Glaucoma(365)   . Pulmonary embolism 06/04/11  . Coronary artery disease   . Atrial fibrillation   . 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 Anxiety    Medications:  Scheduled:    . amiodarone  200 mg Oral Daily  . bimatoprost  1 drop Both Eyes QHS  . brimonidine  1 drop Both Eyes BID  . docusate sodium  100 mg Oral BID  . dorzolamide  1 drop Both Eyes BID  . furosemide  80 mg Oral BID  . gabapentin  300 mg Oral TID  . levothyroxine  100 mcg Oral Daily  . metoprolol  50 mg Oral BH-q7a  . polyethylene glycol powder  17 g Oral Daily  . simvastatin  10 mg Oral q1800  . trimethoprim  100 mg Oral QHS   Assessment: 79yo female admitted c/o productive cough and subjective.  Started on  broad spectrum ABX for suspected pna.  Reduced renal fxn.  Estimated Creatinine Clearance: 31.4 ml/min (by C-G formula based on Cr of 1.23).  Initial doses of ABX given on admission in ED.  Goal of Therapy:  Vancomycin trough level 15-20 mcg/ml  Plan: Levaquin 750mg  IV q48 hrs (renally adjusted) Cefepime 2gm IV x 1 then 1gm IV q24hrs (renally adjusted) Vancomycin 1gm IV q24hrs Check trough at steady state Monitor labs, renal fxn, and cultures per protocol Duration of therapy per MD  Valrie Hart A 09/10/2012,5:14 PM

## 2012-09-10 NOTE — ED Provider Notes (Addendum)
History  This chart was scribed for [name], MD by Shari Heritage, ED Scribe. The patient was seen in room APA04/APA04. Patient's care was started at 1349.  CSN: [id number]  Arrival date & time 09/10/12  1339   First MD Initiated Contact with Patient 09/10/12 1349      Chief Complaint  Patient presents with  . Hemoptysis    The history is provided by the patient. No language interpreter was used.    HPI Comments:   Level V caveat secondary to urgent need for intervention.  [name] is a 79 y.o. female who presents to the Emergency Department complaining of moderate, productive cough onset a couple of days ago. Patient reports that sputum has been tinged with blood. There is associated fatigue and generalized weakness. Patient is a resident at [facility] and says that her roommate has had a cough as well. Patient uses a walker to ambulate.   PCP - Sherwood Gambler   Past Medical History  Diagnosis Date  . Hypertension   . High cholesterol   . Arthritis   . Hypothyroidism   . Arrhythmia     pacemaker  . Glaucoma(365)   . Pulmonary embolism 06/04/11  . Coronary artery disease   . Atrial fibrillation   . 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 Anxiety     Past Surgical History  Procedure Date  . Total hip arthroplasty     right side  . Pacemaker insertion   . Appendectomy   . Abdominal hysterectomy     No family history on file.  History  Substance Use Topics  . Smoking status: Never Smoker   . Smokeless tobacco: Not on file  . Alcohol Use: No    OB History    Grav Para Term Preterm Abortions TAB SAB Ect Mult Living                  Review of Systems  Unable to perform ROS: Other   A complete 10 system review of systems was obtained and all systems are negative except as noted in the HPI and PMH.   Allergies  Naproxen and Robaxin  Home Medications   Current Outpatient Rx  Name  Route  Sig  Dispense  Refill  . ACETAMINOPHEN 325 MG PO TABS    Oral   Take 650 mg by mouth every 6 (six) hours as needed.         . ALPRAZOLAM 0.25 MG PO TABS   Oral   Take 0.25 mg by mouth daily as needed. Anxiety         . MAGIC MOUTHWASH   Oral   Take 10 mLs by mouth 4 (four) times daily as needed.         . AMIODARONE HCL 200 MG PO TABS   Oral   Take 200 mg by mouth daily.          Marland Kitchen BIMATOPROST 0.01 % OP SOLN   Ophthalmic   Apply 1 drop to eye at bedtime.         Marland Kitchen BRIMONIDINE TARTRATE 0.1 % OP SOLN   Both Eyes   Place 1 drop into both eyes 2 (two) times daily.         Marland Kitchen CALCIUM 600 + D PO   Oral   Take 1 tablet by mouth 2 (two) times daily.         . DORZOLAMIDE HCL 2 % OP SOLN   Both Eyes  Place 1 drop into both eyes 2 (two) times daily.         . FUROSEMIDE 80 MG PO TABS   Oral   Take 1 tablet (80 mg total) by mouth 2 (two) times daily.   60 tablet   0   . GABAPENTIN 300 MG PO CAPS   Oral   Take 300 mg by mouth 2 (two) times daily.         Marland Kitchen HYDROCODONE-ACETAMINOPHEN 5-500 MG PO TABS   Oral   Take 1 tablet by mouth every 6 (six) hours as needed.         Marland Kitchen LEVOTHYROXINE SODIUM 100 MCG PO TABS   Oral   Take 100 mcg by mouth daily.         Marland Kitchen METOPROLOL TARTRATE 50 MG PO TABS   Oral   Take 50 mg by mouth every morning.         . OXYCODONE-ACETAMINOPHEN 5-325 MG PO TABS   Oral   Take 1 tablet by mouth every 4 (four) hours as needed for pain.   20 tablet   0   . POLYETHYLENE GLYCOL 3350 PO POWD   Oral   Take 17 g by mouth daily.          Marland Kitchen PRAVASTATIN SODIUM 20 MG PO TABS   Oral   Take 20 mg by mouth daily.         Marland Kitchen TRIMETHOPRIM 100 MG PO TABS   Oral   Take 100 mg by mouth at bedtime.         . WARFARIN SODIUM 2 MG PO TABS   Oral   Take 1-2 mg by mouth daily. Takes one tablet (2mg ) everyday except on Monday and Friday when patient takes half a tablet (1mg )           Triage Vitals: BP 122/62  Pulse 90  Temp 98.2 F (36.8 C) (Oral)  Resp 20  Ht 5\' 3"  (1.6 m)  Wt  143 lb (64.864 kg)  BMI 25.33 kg/m2  SpO2 98%  Physical Exam  Nursing note and vitals reviewed. Constitutional: She is oriented to person, place, and time. She appears well-developed and well-nourished.       Pale appearing.   HENT:  Head: Normocephalic and atraumatic.  Mouth/Throat: Oropharynx is clear and moist. No oropharyngeal exudate.  Eyes: Conjunctivae normal and EOM are normal. Pupils are equal, round, and reactive to light.  Neck: Normal range of motion. Neck supple.  Cardiovascular: Normal rate, regular rhythm and normal heart sounds.   Pulmonary/Chest: Breath sounds normal. Tachypnea noted. No respiratory distress. She has no wheezes. She has no rales.  Abdominal: Soft. Bowel sounds are normal.  Musculoskeletal: Normal range of motion.  Neurological: She is alert and oriented to person, place, and time.  Skin: Skin is warm and dry.  Psychiatric: She has a normal mood and affect.    ED Course  Procedures (including critical care time) DIAGNOSTIC STUDIES: Oxygen Saturation is 98% on room air, normal by my interpretation.    COORDINATION OF CARE: 2:29 PM- Patient informed of current plan for treatment and evaluation and agrees with plan at this time. Patient's x-ray is consistent with pneumonia. Informed patient that she will need to be admitted for further treatment.   Results for orders placed during the hospital encounter of 09/10/12  CBC WITH DIFFERENTIAL      Component Value Range   WBC 14.7 (*) 4.0 - 10.5 K/uL   RBC 4.52  3.87 - 5.11 MIL/uL   Hemoglobin 12.7  12.0 - 15.0 g/dL   HCT 95.6  21.3 - 08.6 %   MCV 87.4  78.0 - 100.0 fL   MCH 28.1  26.0 - 34.0 pg   MCHC 32.2  30.0 - 36.0 g/dL   RDW 57.8 (*) 46.9 - 62.9 %   Platelets 335  150 - 400 K/uL   Neutrophils Relative 86 (*) 43 - 77 %   Neutro Abs 12.6 (*) 1.7 - 7.7 K/uL   Lymphocytes Relative 6 (*) 12 - 46 %   Lymphs Abs 0.9  0.7 - 4.0 K/uL   Monocytes Relative 8  3 - 12 %   Monocytes Absolute 1.1 (*) 0.1  - 1.0 K/uL   Eosinophils Relative 1  0 - 5 %   Eosinophils Absolute 0.1  0.0 - 0.7 K/uL   Basophils Relative 0  0 - 1 %   Basophils Absolute 0.0  0.0 - 0.1 K/uL  BASIC METABOLIC PANEL      Component Value Range   Sodium 135  135 - 145 mEq/L   Potassium 3.2 (*) 3.5 - 5.1 mEq/L   Chloride 99  96 - 112 mEq/L   CO2 25  19 - 32 mEq/L   Glucose, Bld 162 (*) 70 - 99 mg/dL   BUN 21  6 - 23 mg/dL   Creatinine, Ser 5.28 (*) 0.50 - 1.10 mg/dL   Calcium 41.3  8.4 - 24.4 mg/dL   GFR calc non Af Amer 39 (*) >90 mL/min   GFR calc Af Amer 46 (*) >90 mL/min     Dg Chest Portable 1 View  09/10/2012  *RADIOLOGY REPORT*  Clinical Data: Cough, shortness of breath and hypertension.  PORTABLE CHEST - 1 VIEW  Comparison: 08/25/2012.  Findings: Trachea is midline.  Heart is enlarged.  There is bibasilar air space disease.  No definite pleural fluid.  Pacemaker lead tips project over the right atrium and right ventricle. Vertebroplasty is seen in the lower thoracic or upper lumbar spine.  IMPRESSION: Bibasilar air space disease, new from 08/25/2012.   Original Report Authenticated By: Leanna Battles, M.D.      No diagnosis found.  Date: 09/10/2012  Rate: 71  Rhythm: paced rhythm  QRS Axis: normal  Intervals: normal  ST/T Wave abnormalities: normal  Conduction Disutrbances:nonspecific intraventricular conduction delay  Narrative Interpretation:   Old EKG Reviewed: changes noted    MDM  Chest x-ray reveals bibasilar air space disease.  Rx for healthcare associated pneumonia. Admit       I personally performed the services described in this documentation, which was scribed in my presence. The recorded information has been reviewed and is accurate.    [name], MD 09/10/12 1657  [name], MD 09/13/12 [phone]

## 2012-09-10 NOTE — ED Notes (Signed)
EMS reports staff at [facility] says pt has had productive cough with blood tinged sputum for past couple of days.  Reports pt fell over a month ago and has 2 broken discs in her back.  Takes percocet for her back pain.

## 2012-09-10 NOTE — H&P (Signed)
[facility] History and Physical  [name] ZOX:[id number] DOB: 25-Jun-1927 DOA: 09/10/2012  Referring physician: Dr. Adriana Simas, ER physician. PCP: Cassell Smiles., MD    Chief Complaint: Productive cough,  subjective fever.  HPI: [name] is a 79 y.o. female complains of productive cough and subjective fever for the last 24 hours. She thinks she also had blood mixed in with sputum. She does not feel critically short of breath. She feels very weak and tired. She feels like she is going to die. She does have a history of congestive heart failure and she does describe leg swelling bilaterally for the last week or so also. She lives in a skilled nursing facility. She denies any chest pain. Her mentation has not changed.   Review of Systems:   Apart from history of present illness, other systems negative.  Past Medical History  Diagnosis Date  . Hypertension   . High cholesterol   . Arthritis   . Hypothyroidism   . Arrhythmia     pacemaker  . Glaucoma(365)   . Pulmonary embolism 06/04/11  . Coronary artery disease   . Atrial fibrillation   . 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 Anxiety    Past Surgical History  Procedure Date  . Total hip arthroplasty     right side  . Pacemaker insertion   . Appendectomy   . Abdominal hysterectomy    Social History:  She currently lives in a skilled nursing facility. She does not drink alcohol. She does not smoke cigarettes.   Allergies  Allergen Reactions  . Naproxen     Patient on coumadin  . Robaxin (Methocarbamol)     Causes patient to be confused    No family history on file. noncontributory.  Prior to Admission medications   Medication Sig Start Date End Date Taking? Authorizing Provider  acetaminophen (TYLENOL) 325 MG tablet Take 650 mg by mouth every 6 (six) hours as needed.   Yes Historical Provider, MD  albuterol (PROVENTIL) (2.5 MG/3ML) 0.083% nebulizer solution Take 2.5 mg by nebulization every 6  (six) hours as needed. For shortness of breath   Yes Historical Provider, MD  ALPRAZolam (XANAX) 0.25 MG tablet Take 0.25 mg by mouth daily as needed. Anxiety   Yes Historical Provider, MD  Alum & Mag Hydroxide-Simeth (MAGIC MOUTHWASH) SOLN Take 10 mLs by mouth 4 (four) times daily as needed.   Yes Historical Provider, MD  amiodarone (PACERONE) 200 MG tablet Take 200 mg by mouth daily.  04/30/11  Yes Historical Provider, MD  bimatoprost (LUMIGAN) 0.01 % SOLN Apply 1 drop to eye at bedtime.   Yes Historical Provider, MD  brimonidine (ALPHAGAN P) 0.1 % SOLN Place 1 drop into both eyes 2 (two) times daily.   Yes Historical Provider, MD  Calcium Carbonate-Vitamin D (CALCIUM 600 + D PO) Take 1 tablet by mouth 2 (two) times daily.   Yes Historical Provider, MD  docusate sodium (COLACE) 100 MG capsule Take 100 mg by mouth 2 (two) times daily.   Yes Historical Provider, MD  dorzolamide (TRUSOPT) 2 % ophthalmic solution Place 1 drop into both eyes 2 (two) times daily.   Yes Historical Provider, MD  furosemide (LASIX) 80 MG tablet Take 1 tablet (80 mg total) by mouth 2 (two) times daily. 09/26/11 09/25/12 Yes Erick Blinks, MD  gabapentin (NEURONTIN) 300 MG capsule Take 300 mg by mouth 3 (three) times daily.  11/27/11 11/26/12 Yes Vickki Hearing, MD  HYDROcodone-acetaminophen (VICODIN) 5-500 MG  per tablet Take 1 tablet by mouth every 6 (six) hours as needed.   Yes Historical Provider, MD  levothyroxine (SYNTHROID, LEVOTHROID) 100 MCG tablet Take 100 mcg by mouth daily.   Yes Historical Provider, MD  metoprolol (LOPRESSOR) 50 MG tablet Take 50 mg by mouth every morning.   Yes Historical Provider, MD  ondansetron (ZOFRAN) 4 MG tablet Take 4 mg by mouth every 6 (six) hours as needed. For nausea   Yes Historical Provider, MD  polyethylene glycol powder (GLYCOLAX/MIRALAX) powder Take 17 g by mouth daily.    Yes Historical Provider, MD  pravastatin (PRAVACHOL) 20 MG tablet Take 20 mg by mouth daily.   Yes Historical  Provider, MD  trimethoprim (TRIMPEX) 100 MG tablet Take 100 mg by mouth at bedtime.   Yes Historical Provider, MD  warfarin (COUMADIN) 2 MG tablet Take 1-2 mg by mouth daily. Takes one tablet (2mg ) everyday except on Monday and Friday when patient takes half a tablet (1mg )   Yes Historical Provider, MD  oxyCODONE-acetaminophen (PERCOCET) 5-325 MG per tablet Take 1 tablet by mouth every 4 (four) hours as needed for pain. 06/19/12   Vida Roller, MD   Physical Exam: Filed Vitals:   09/10/12 1350 09/10/12 1453 09/10/12 1535 09/10/12 1634  BP:  122/62  139/66  Pulse:  65  60  Temp:   97.8 F (36.6 C)   TempSrc:   Oral   Resp:  18  18  Height: 5\' 3"  (1.6 m)     Weight: 64.864 kg (143 lb)     SpO2:  95%  100%     General:  She looks unwell. She is not critically septic but looks very weak.  Eyes: No jaundice.  ENT: No abnormalities.  Neck: No lymphadenopathy.  Cardiovascular: Heart sounds are present and somewhat irregular. There are no murmurs. She does have peripheral pitting edema up to her knees bilaterally in her legs.  Respiratory: Lung fields show bilateral wheezing and crackles at both bases.  Abdomen: Soft, nontender.  Skin: No rashes.  Musculoskeletal: Age-related osteoarthritic changes.  Psychiatric: Depressed affect.  Neurologic: Alert and orientated without focal neurological signs.  Labs on Admission:  Basic Metabolic Panel:  Lab 09/10/12 1610  NA 135  K 3.2*  CL 99  CO2 25  GLUCOSE 162*  BUN 21  CREATININE 1.23*  CALCIUM 10.0  MG --  PHOS --      CBC:  Lab 09/10/12 1346  WBC 14.7*  NEUTROABS 12.6*  HGB 12.7  HCT 39.5  MCV 87.4  PLT 335      BNP (last 3 results)  Basename 03/14/12 2043  PROBNP 1320.0*      Radiological Exams on Admission: Dg Chest Portable 1 View  09/10/2012  *RADIOLOGY REPORT*  Clinical Data: Cough, shortness of breath and hypertension.  PORTABLE CHEST - 1 VIEW  Comparison: 08/25/2012.  Findings: Trachea is  midline.  Heart is enlarged.  There is bibasilar air space disease.  No definite pleural fluid.  Pacemaker lead tips project over the right atrium and right ventricle. Vertebroplasty is seen in the lower thoracic or upper lumbar spine.  IMPRESSION: Bibasilar air space disease, new from 08/25/2012.   Original Report Authenticated By: Leanna Battles, M.D.       Assessment/Plan   1. Health associated pneumonia, bilaterally. 2. Decompensated congestive heart failure. 3. Paroxysmal atrial fibrillation, stable. 4. Sick sinus syndrome, status post biventricular pacemaker. 5. Hypertension. 6. Hypothyroidism. 7. Chronic pain syndrome.  Plan: 1. Admit. 2.  Intravenous antibiotics. 3. Intravenous Lasix. 4. Oral steroids. Further recommendations will depend on patient's hospital progress.  Code Status: DO NOT RESUSCITATE per previous hospitalization.   Family Communication: Discussed plan with patient at the bedside.   Disposition Plan: Back to the skilled nursing facility when medically stable.   Time spent: 45 minutes.  Wilson Singer [facility] Pager [phone].  If 7PM-7AM, please contact night-coverage [email or web address] Password [facility] 09/10/2012, 5:07 PM

## 2012-09-11 ENCOUNTER — Inpatient Hospital Stay (HOSPITAL_COMMUNITY): Payer: Medicare Other

## 2012-09-11 LAB — CBC
HCT: 37.6 % (ref 36.0–46.0)
Hemoglobin: 12.1 g/dL (ref 12.0–15.0)
RBC: 4.34 MIL/uL (ref 3.87–5.11)
WBC: 10.9 10*3/uL — ABNORMAL HIGH (ref 4.0–10.5)

## 2012-09-11 LAB — LEGIONELLA ANTIGEN, URINE: Legionella Antigen, Urine: NEGATIVE

## 2012-09-11 LAB — COMPREHENSIVE METABOLIC PANEL
BUN: 17 mg/dL (ref 6–23)
CO2: 26 mEq/L (ref 19–32)
Calcium: 9.7 mg/dL (ref 8.4–10.5)
Creatinine, Ser: 1.12 mg/dL — ABNORMAL HIGH (ref 0.50–1.10)
GFR calc Af Amer: 51 mL/min — ABNORMAL LOW (ref >90)
GFR calc non Af Amer: 44 mL/min — ABNORMAL LOW (ref >90)
Glucose, Bld: 89 mg/dL (ref 70–99)
Sodium: 137 mEq/L (ref 135–145)
Total Protein: 6.5 g/dL (ref 6.0–8.3)

## 2012-09-11 LAB — PRO B NATRIURETIC PEPTIDE: Pro B Natriuretic peptide (BNP): 7411 pg/mL — ABNORMAL HIGH (ref 0–450)

## 2012-09-11 LAB — PROTIME-INR
INR: 2.79 — ABNORMAL HIGH (ref 0.00–1.49)
Prothrombin Time: 28 seconds — ABNORMAL HIGH (ref 11.6–15.2)

## 2012-09-11 MED ORDER — WARFARIN SODIUM 2 MG PO TABS
2.0000 mg | ORAL_TABLET | Freq: Once | ORAL | Status: AC
  Administered 2012-09-11: 2 mg via ORAL
  Filled 2012-09-11: qty 1

## 2012-09-11 MED ORDER — POTASSIUM CHLORIDE CRYS ER 20 MEQ PO TBCR
40.0000 meq | EXTENDED_RELEASE_TABLET | ORAL | Status: AC
  Administered 2012-09-11 (×2): 40 meq via ORAL
  Filled 2012-09-11: qty 1
  Filled 2012-09-11: qty 2
  Filled 2012-09-11: qty 1

## 2012-09-11 MED ORDER — POTASSIUM CHLORIDE CRYS ER 20 MEQ PO TBCR
40.0000 meq | EXTENDED_RELEASE_TABLET | Freq: Every day | ORAL | Status: DC
Start: 2012-09-12 — End: 2012-09-12
  Administered 2012-09-12: 40 meq via ORAL
  Filled 2012-09-11: qty 2

## 2012-09-11 MED ORDER — FUROSEMIDE 80 MG PO TABS
80.0000 mg | ORAL_TABLET | Freq: Two times a day (BID) | ORAL | Status: DC
  Administered 2012-09-11 – 2012-09-12 (×3): 80 mg via ORAL
  Filled 2012-09-11: qty 2
  Filled 2012-09-11 (×2): qty 1

## 2012-09-11 NOTE — Progress Notes (Signed)
ANTICOAGULATION CONSULT NOTE   Pharmacy Consult for Warfarin Indication: atrial fibrillation  Allergies  Allergen Reactions  . Naproxen     Patient on coumadin  . Robaxin (Methocarbamol)     Causes patient to be confused   Patient Measurements: Height: 5\' 3"  (160 cm) Weight: 150 lb 12.7 oz (68.4 kg) IBW/kg (Calculated) : 52.4   Vital Signs: Temp: 97.7 F (36.5 C) (01/09 0738) Temp src: Oral (01/09 0738) BP: 144/61 mmHg (01/09 0600) Pulse Rate: 64  (01/09 0600)  Labs:  Basename 09/11/12 0431 09/10/12 1514 09/10/12 1346  HGB 12.1 -- 12.7  HCT 37.6 -- 39.5  PLT 316 -- 335  APTT -- -- --  LABPROT 28.0* 29.2* --  INR 2.79* 2.95* --  HEPARINUNFRC -- -- --  CREATININE 1.12* -- 1.23*  CKTOTAL -- -- --  CKMB -- -- --  TROPONINI -- -- --   Estimated Creatinine Clearance: 35.3 ml/min (by C-G formula based on Cr of 1.12).  Medical History: Past Medical History  Diagnosis Date  . Hypertension   . High cholesterol   . Arthritis   . Hypothyroidism   . Arrhythmia     pacemaker  . Glaucoma(365)   . Pulmonary embolism 06/04/11  . Coronary artery disease   . Atrial fibrillation   . 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 Anxiety    Medications:  Prescriptions prior to admission  Medication Sig Dispense Refill  . acetaminophen (TYLENOL) 325 MG tablet Take 650 mg by mouth every 6 (six) hours as needed.      Marland Kitchen albuterol (PROVENTIL) (2.5 MG/3ML) 0.083% nebulizer solution Take 2.5 mg by nebulization every 6 (six) hours as needed. For shortness of breath      . ALPRAZolam (XANAX) 0.25 MG tablet Take 0.25 mg by mouth daily as needed. Anxiety      . Alum & Mag Hydroxide-Simeth (MAGIC MOUTHWASH) SOLN Take 10 mLs by mouth 4 (four) times daily as needed.      Marland Kitchen amiodarone (PACERONE) 200 MG tablet Take 200 mg by mouth daily.       . bimatoprost (LUMIGAN) 0.01 % SOLN Apply 1 drop to eye at bedtime.      . brimonidine (ALPHAGAN P) 0.1 % SOLN Place 1 drop into both eyes 2 (two)  times daily.      . Calcium Carbonate-Vitamin D (CALCIUM 600 + D PO) Take 1 tablet by mouth 2 (two) times daily.      Marland Kitchen docusate sodium (COLACE) 100 MG capsule Take 100 mg by mouth 2 (two) times daily.      . dorzolamide (TRUSOPT) 2 % ophthalmic solution Place 1 drop into both eyes 2 (two) times daily.      . furosemide (LASIX) 80 MG tablet Take 1 tablet (80 mg total) by mouth 2 (two) times daily.  60 tablet  0  . gabapentin (NEURONTIN) 300 MG capsule Take 300 mg by mouth 3 (three) times daily.       Marland Kitchen HYDROcodone-acetaminophen (VICODIN) 5-500 MG per tablet Take 1 tablet by mouth every 6 (six) hours as needed.      Marland Kitchen levothyroxine (SYNTHROID, LEVOTHROID) 100 MCG tablet Take 100 mcg by mouth daily.      . metoprolol (LOPRESSOR) 50 MG tablet Take 50 mg by mouth every morning.      . ondansetron (ZOFRAN) 4 MG tablet Take 4 mg by mouth every 6 (six) hours as needed. For nausea      . polyethylene glycol powder (GLYCOLAX/MIRALAX) powder  Take 17 g by mouth daily.       . pravastatin (PRAVACHOL) 20 MG tablet Take 20 mg by mouth daily.      Marland Kitchen trimethoprim (TRIMPEX) 100 MG tablet Take 100 mg by mouth at bedtime.      Marland Kitchen warfarin (COUMADIN) 2 MG tablet Take 1-2 mg by mouth daily. Takes one tablet (2mg ) everyday except on Monday and Friday when patient takes half a tablet (1mg )      . oxyCODONE-acetaminophen (PERCOCET) 5-325 MG per tablet Take 1 tablet by mouth every 4 (four) hours as needed for pain.  20 tablet  0   Assessment: 79yo female on chronic warfarin for afib.  INR is therapeutic.    Goal of Therapy:  INR 2-3 Monitor platelets by anticoagulation protocol: Yes   Plan: Coumadin 2mg  today (per home dosing regimen) INR daily  Margo Aye, Mervil Wacker A 09/11/2012,8:47 AM

## 2012-09-11 NOTE — Progress Notes (Addendum)
     Subjective: This lady does feel better, complaining of back pain from her compression fracture. However, her breathing has improved. She has had a very good urine output in the last 24 hours. She has had no fever.           Physical Exam: Blood pressure 144/61, pulse 64, temperature 98.2 F (36.8 C), temperature source Axillary, resp. rate 22, height 5\' 3"  (1.6 m), weight 68.4 kg (150 lb 12.7 oz), SpO2 97.00%. She does look better. There is virtually no leg edema compared to yesterday. Her lung fields are much clear without evidence of wheezing. There are very few crackles. There is no bronchial breathing. She is alert and orientated.   Investigations:  Recent Results (from the past 240 hour(s))  MRSA PCR SCREENING     Status: Normal   Collection Time   09/10/12  5:45 PM      Component Value Range Status Comment   MRSA by PCR NEGATIVE  NEGATIVE Final      Basic Metabolic Panel:  Basename 09/11/12 0431 09/10/12 1346  NA 137 135  K 2.7* 3.2*  CL 101 99  CO2 26 25  GLUCOSE 89 162*  BUN 17 21  CREATININE 1.12* 1.23*  CALCIUM 9.7 10.0  MG -- --  PHOS -- --   Liver Function Tests:  [facility] 09/11/12 0431  AST 20  ALT 20  ALKPHOS 97  BILITOT 0.5  PROT 6.5  ALBUMIN 2.7*     CBC:  Basename 09/11/12 0431 09/10/12 1346  WBC 10.9* 14.7*  NEUTROABS -- 12.6*  HGB 12.1 12.7  HCT 37.6 39.5  MCV 86.6 87.4  PLT 316 335    Dg Chest Portable 1 View  09/10/2012  *RADIOLOGY REPORT*  Clinical Data: Cough, shortness of breath and hypertension.  PORTABLE CHEST - 1 VIEW  Comparison: 08/25/2012.  Findings: Trachea is midline.  Heart is enlarged.  There is bibasilar air space disease.  No definite pleural fluid.  Pacemaker lead tips project over the right atrium and right ventricle. Vertebroplasty is seen in the lower thoracic or upper lumbar spine.  IMPRESSION: Bibasilar air space disease, new from 08/25/2012.   Original Report Authenticated By: Leanna Battles, M.D.        Medications: I have reviewed the patient's current medications.  Impression: 1. Healthcare associated pneumonia, clinically improving. 2. Decompensated congestive heart failure, clinically improving. 3. Chronic pain syndrome. 4. Hypertension. 5. Sick sinus syndrome, status post pacemaker. 6. History of paroxysmal atrial fibrillation, currently appears to be in sinus rhythm. On chronic anticoagulation. 7. Hypokalemia.    Plan: 1. Discontinue IV Lasix. Start with oral Lasix at her previous prehospitalization dose of 80 mg twice a day. 2. Replete potassium. 3. Discontinue Foley catheter. 4. Continue with intravenous antibiotics for today. 5. Physical therapy. 6. Probable discharge back to the skilled nursing facility tomorrow if continues to improve. 7. Patient can transfer to telemetry floor.     LOS: 1 day   Wilson Singer Pager [phone]  09/11/2012, 7:38 AM

## 2012-09-11 NOTE — Evaluation (Signed)
Physical Therapy Evaluation Patient Details Name: [name] MRN: [id number] DOB: 1927-06-14 Today's Date: 09/11/2012 Time: 7829-5621 PT Time Calculation (min): 37 min  PT Assessment / Plan / Recommendation Clinical Impression  Pt is seen for eval and found to be severely weak and deconditioned.  She is having 10/10 in mid back from recent T9 compression fx (per x-ray on 08-23-12, it is age indeterminate), and is unwilling to partipate in much mobilization.  She is a resident of [address] and had been primarily w/c dependent, receiving gait training using a walker with PT. Unless pt gets her pain under better control to where she will work with [place], I  think she will need SNF at d/c.      PT Assessment  Patient needs continued PT services    Follow Up Recommendations  SNF    Does the patient have the potential to tolerate intense rehabilitation      Barriers to Discharge None Pt will need appropriate level of mobility to be able to return to [facility]    Equipment Recommendations  None recommended by PT    Recommendations for Other Services OT consult   Frequency Min 3X/week    Precautions / Restrictions Precautions Precautions: Fall Restrictions Weight Bearing Restrictions: No   Pertinent Vitals/Pain       Mobility  Bed Mobility Bed Mobility: Rolling Left;Rolling Right Rolling Left: 3: Mod assist;With rail Transfers Transfers: Not assessed Details for Transfer Assistance: Pt is having too much pain to even try to transfer to sitting Ambulation/Gait Stairs: No Wheelchair Mobility Wheelchair Mobility: No    Shoulder Instructions     Exercises General Exercises - Lower Extremity Ankle Circles/Pumps: AROM;Both;10 reps;Supine Short Arc Quad: AROM;Both;10 reps;Supine Heel Slides: AAROM;Both;10 reps;Supine Hip ABduction/ADduction: AAROM;Both;10 reps;Supine   PT Diagnosis: Generalized weakness;Acute pain  PT Problem List: Decreased strength;Decreased activity  tolerance;Decreased mobility;Pain PT Treatment Interventions: Functional mobility training;Therapeutic activities;Patient/family education;Therapeutic exercise   PT Goals Acute Rehab PT Goals PT Goal Formulation: With patient Time For Goal Achievement: 09/25/12 Potential to Achieve Goals: Fair Pt will go Supine/Side to Sit: with mod assist;with HOB not 0 degrees (comment degree) PT Goal: Supine/Side to Sit - Progress: Goal set today Pt will go Sit to Stand: with mod assist;with upper extremity assist PT Goal: Sit to Stand - Progress: Goal set today Pt will go Stand to Sit: with mod assist;with upper extremity assist PT Goal: Stand to Sit - Progress: Goal set today Pt will Transfer Bed to Chair/Chair to Bed: with mod assist PT Transfer Goal: Bed to Chair/Chair to Bed - Progress: Goal set today  Visit Information  Last PT Received On: 09/11/12    Subjective Data  Subjective: I feel terrible Patient Stated Goal: return to [facility]   Prior Functioning  Home Living Available Help at Discharge: Personal care attendant Type of Home: Assisted living Home Access: Level entry Home Layout: One level Home Adaptive Equipment: Walker - rolling;Wheelchair - manual Prior Function Level of Independence: Needs assistance Needs Assistance: Light Housekeeping;Meal Prep;Gait Able to Take Stairs?: No Driving: No Vocation: Retired Musician: No difficulties    Cognition  Overall Cognitive Status: Appears within functional limits for tasks assessed/performed Arousal/Alertness: Lethargic Orientation Level: Appears intact for tasks assessed Behavior During Session: Flat affect    Extremity/Trunk Assessment Right Lower Extremity Assessment RLE ROM/Strength/Tone: Deficits RLE ROM/Strength/Tone Deficits: strength generally 3-/5 RLE Sensation: WFL - Light Touch Left Lower Extremity Assessment LLE ROM/Strength/Tone: Deficits LLE ROM/Strength/Tone Deficits: strength  generally 3-/5 LLE Sensation:  WFL - Light Touch Trunk Assessment Trunk Assessment: Kyphotic   Balance Balance Balance Assessed: No  End of Session PT - End of Session Activity Tolerance: Patient limited by pain Patient left: in bed;with call bell/phone within reach Nurse Communication: Mobility status  GP     Konrad Penta 09/11/2012, 10:49 AM

## 2012-09-11 NOTE — Clinical Social Work Psychosocial (Signed)
    Clinical Social Work Department BRIEF PSYCHOSOCIAL ASSESSMENT 09/11/2012  Patient:  [name], [name]     Account Number:  [id number]     Admit date:  09/10/2012  Clinical Social Worker:  Santa Genera, CLINICAL SOCIAL WORKER  Date/Time:  09/11/2012 09:00 AM  Referred by:  CSW  Date Referred:  09/11/2012 Referred for  ALF Placement   Other Referral:   Interview type:  Patient Other interview type:   Spoke w facility and husband, left message for son [name]    PSYCHOSOCIAL DATA Living Status:  FACILITY Admitted from facility:  [facility] Level of care:  Assisted Living Primary support name:  [name] Primary support relationship to patient:  CHILD, ADULT Degree of support available:   Significantly involved in patient's care per facility.  Is patient's son.  Also spoke w patient's husband, [name].    CURRENT CONCERNS Current Concerns  Post-Acute Placement   Other Concerns:    SOCIAL WORK ASSESSMENT / PLAN CSW spoke w [facility].  Patient has been resident there approx one year, came from home where husband (diabetic) could no longer care for her.  Has son who is POA and significantly involved w her care.  Patient has been able to ambulate w walker but primarily uses wheelchair.  Does not travel long distances.  Needs assistance w bathing, but carries out other ADLs without assistance.  Has roommate who was treated for influenza last week 1/2 - 3/14, patient has been sick since 09/08/12 w URI sx, resisted coming to [facility] for treatment as she had prescheduled appt w PCP Sherwood Gambler) on 09/11/12.  Facility willing to have patient return at discharge.    Spoke w husband, [name], confirmed that family is comfortable w return to [facility] and have no concerns about patient at this time.  Left message for son, [name].    Patient moved from ICU, CSW met w her.  Patient alert and oriented x4 and in obvious pain, says her back  hurts. Patient confirmed she lives at [facility], says son [name] "makes all the decisions" now and wants me to confirm discharge disposition w son.  However, patient is satisfied w returning to [facility] at discharge.   Assessment/plan status:  Psychosocial Support/Ongoing Assessment of Needs Other assessment/ plan:   Information/referral to community resources:   None needed at this time.    PATIENT'S/FAMILY'S RESPONSE TO PLAN OF CARE: Patient and husband appreciative and willing to work w CSW.    Santa Genera, LCSW Clinical Social Worker [phone])

## 2012-09-11 NOTE — Progress Notes (Signed)
Pt with orders to transfer to telemetry floor. Report called and given to Shanda Bumps, Charity fundraiser.

## 2012-09-11 NOTE — Progress Notes (Signed)
Dr. Orvan Falconer made aware the patient's potassium is 2.7.

## 2012-09-12 DIAGNOSIS — S32009A Unspecified fracture of unspecified lumbar vertebra, initial encounter for closed fracture: Secondary | ICD-10-CM

## 2012-09-12 LAB — BASIC METABOLIC PANEL
BUN: 20 mg/dL (ref 6–23)
Creatinine, Ser: 1.34 mg/dL — ABNORMAL HIGH (ref 0.50–1.10)
GFR calc Af Amer: 41 mL/min — ABNORMAL LOW (ref >90)
GFR calc non Af Amer: 36 mL/min — ABNORMAL LOW (ref >90)
Potassium: 3.8 mEq/L (ref 3.5–5.1)

## 2012-09-12 LAB — PROTIME-INR
INR: 2.38 — ABNORMAL HIGH (ref 0.00–1.49)
Prothrombin Time: 24.9 seconds — ABNORMAL HIGH (ref 11.6–15.2)

## 2012-09-12 MED ORDER — POTASSIUM CHLORIDE CRYS ER 20 MEQ PO TBCR: 40.0000 meq | EXTENDED_RELEASE_TABLET | Freq: Every day | ORAL | Status: DC

## 2012-09-12 MED ORDER — LEVOFLOXACIN 750 MG PO TABS: 750.0000 mg | ORAL_TABLET | Freq: Every day | ORAL | Status: AC

## 2012-09-12 NOTE — Progress Notes (Signed)
Pt discharged with instructions, prescriptions and care notes.  She also was given her d/c packet.  The pt was transported back to [facility] by her family with packet in stable condition.

## 2012-09-12 NOTE — Clinical Social Work Note (Signed)
Patient ready for discharge and return to [facility] today.  Patient and son, Angelica Ran, informed and agreeable.  Facility [facility]) contacted and agreeable, having assessed patient yesterday.  FL2 reviewed w RN, discharge summary faxed to facility via [facility], discharge packet prepared and given to RN.  Son to transport to [employer] [facility].  Santa Genera, LCSW Clinical Social Worker [phone])

## 2012-09-12 NOTE — Discharge Summary (Addendum)
Physician Discharge Summary  CACI ORREN AVW:[id number] DOB: [birth year] DOA: 09/10/2012  PCP: Cassell Smiles., MD  Admit date: 09/10/2012 Discharge date: 09/12/2012  Time spent: Greater than 30 minutes  Recommendations for Outpatient Follow-up:  1. Follow with primary care physician within the next week or so.   Discharge Diagnoses:  1. Healthcare associated pneumonia, clinically improving. 2. Acute diastolic congestive heart failure, improving now. 3. Sick sinus syndrome. 4. Hypertension. 5. Chronic pain syndrome. 6. Lumbar compression fracture.   Discharge Condition: Stable.  Diet recommendation: Low salt diet.  Filed Weights   09/10/12 1350 09/10/12 1745 09/11/12 0500  Weight: 64.864 kg (143 lb) 66.2 kg (145 lb 15.1 oz) 68.4 kg (150 lb 12.7 oz)    History of present illness:  This very pleasant 79 year old lady presents to the hospital with symptoms of productive cough and subjective fever. Please see initial history as outlined below: HPI: PHILICIA HEYNE is a 79 y.o. female complains of productive cough and subjective fever for the last 24 hours. She thinks she also had blood mixed in with sputum. She does not feel critically short of breath. She feels very weak and tired. She feels like she is going to die. She does have a history of congestive heart failure and she does describe leg swelling bilaterally for the last week or so also. She lives in a skilled nursing facility. She denies any chest pain. Her mentation has not changed.  Hospital Course:  Patient was admitted and started on intravenous antibiotics for healthcare associated pneumonia and chest x-ray was impressive for congestive heart failure so therefore she was given intravenous diuretics. She has many good and rapid improvement with this. She is no fever. Her chest x-ray is improved. She still has lumbar back pain consistent with her previous lumbar fracture. This is an ongoing issue and physical therapy seems  to help this as well as opioid medications. Procedures:  None.   Consultations:  None.  Discharge Exam: Filed Vitals:   09/11/12 1450 09/11/12 2021 09/11/12 2322 09/12/12 0642  BP: 108/49 98/39 98/53  110/58  Pulse: 61 61  64  Temp: 98 F (36.7 C) 98.7 F (37.1 C)  98.6 F (37 C)  TempSrc: Oral Oral  Oral  Resp: 20 20  20   Height:      Weight:      SpO2: 95% 96%  96%    General: She looks chronically sick but is not septic clinically. There is no increased work of breathing. Cardiovascular: Heart sounds are present and without gallop rhythm. Respiratory: Lung fields are relatively clear without crackles wheezes or bronchial breathing. There is no increased work of breathing at rest.  Discharge Instructions  Discharge Orders    Future Orders Please Complete By Expires   Diet - low sodium heart healthy      Increase activity slowly          Medication List     As of 09/12/2012  9:33 AM    STOP taking these medications         HYDROcodone-acetaminophen 5-500 MG per tablet   Commonly known as: VICODIN      TAKE these medications         acetaminophen 325 MG tablet   Commonly known as: TYLENOL   Take 650 mg by mouth every 6 (six) hours as needed.      albuterol (2.5 MG/3ML) 0.083% nebulizer solution   Commonly known as: PROVENTIL   Take 2.5 mg by nebulization every 6 (  six) hours as needed. For shortness of breath      ALPRAZolam 0.25 MG tablet   Commonly known as: XANAX   Take 0.25 mg by mouth daily as needed. Anxiety      amiodarone 200 MG tablet   Commonly known as: PACERONE   Take 200 mg by mouth daily.      brimonidine 0.1 % Soln   Commonly known as: ALPHAGAN P   Place 1 drop into both eyes 2 (two) times daily.      CALCIUM 600 + D PO   Take 1 tablet by mouth 2 (two) times daily.      docusate sodium 100 MG capsule   Commonly known as: COLACE   Take 100 mg by mouth 2 (two) times daily.      dorzolamide 2 % ophthalmic solution   Commonly known  as: TRUSOPT   Place 1 drop into both eyes 2 (two) times daily.      furosemide 80 MG tablet   Commonly known as: LASIX   Take 1 tablet (80 mg total) by mouth 2 (two) times daily.      gabapentin 300 MG capsule   Commonly known as: NEURONTIN   Take 300 mg by mouth 3 (three) times daily.      levofloxacin 750 MG tablet   Commonly known as: LEVAQUIN   Take 1 tablet (750 mg total) by mouth daily.      levothyroxine 100 MCG tablet   Commonly known as: SYNTHROID, LEVOTHROID   Take 100 mcg by mouth daily.      LUMIGAN 0.01 % Soln   Generic drug: bimatoprost   Apply 1 drop to eye at bedtime.      magic mouthwash Soln   Take 10 mLs by mouth 4 (four) times daily as needed.      metoprolol 50 MG tablet   Commonly known as: LOPRESSOR   Take 50 mg by mouth every morning.      ondansetron 4 MG tablet   Commonly known as: ZOFRAN   Take 4 mg by mouth every 6 (six) hours as needed. For nausea      oxyCODONE-acetaminophen 5-325 MG per tablet   Commonly known as: PERCOCET/ROXICET   Take 1 tablet by mouth every 4 (four) hours as needed for pain.      polyethylene glycol powder powder   Commonly known as: GLYCOLAX/MIRALAX   Take 17 g by mouth daily.      potassium chloride SA 20 MEQ tablet   Commonly known as: K-DUR,KLOR-CON   Take 2 tablets (40 mEq total) by mouth daily.      pravastatin 20 MG tablet   Commonly known as: PRAVACHOL   Take 20 mg by mouth daily.      trimethoprim 100 MG tablet   Commonly known as: TRIMPEX   Take 100 mg by mouth at bedtime.      warfarin 2 MG tablet   Commonly known as: COUMADIN   Take 1-2 mg by mouth daily. Takes one tablet (2mg ) everyday except on Monday and Friday when patient takes half a tablet (1mg )          The results of significant diagnostics from this hospitalization (including imaging, microbiology, ancillary and laboratory) are listed below for reference.    Significant Diagnostic Studies: Ct Abdomen Pelvis Wo  Contrast  08/25/2012  *RADIOLOGY REPORT*  Clinical Data: Abdominal pain  CT ABDOMEN AND PELVIS WITHOUT CONTRAST  Technique:  Multidetector CT imaging of the abdomen  and pelvis was performed following the standard protocol without intravenous contrast.  Comparison: 06/19/2012  Findings: Chronic changes at the lung bases.  Pectus excavatum. Pacemaker device in place.  No hydronephrosis.  No evidence of urinary calculus.  Unenhanced liver, gallbladder, spleen, adrenal glands are stable. Pancreas remains atrophic. Gallbladder sludge.  Stomach is moderately distended.  Prominent stool burden throughout the colon.  No disproportionate dilatation of small bowel.  Umbilical hernia contains adipose tissue.  Right total hip arthroplasty again noted.  Bladder is mildly distended.  New T9 compression fracture is partially imaged.  There is some retropulsion of the inferior endplate.  Otherwise, stable compression deformities of the lumbar spine and lower thoracic spine.  IMPRESSION: No evidence of urinary calculus.  New T9 compression fracture is partially imaged.  There is some retropulsion of the inferior endplate.   Original Report Authenticated By: Jolaine Click, M.D.    Dg Thoracic Spine W/swimmers  08/25/2012  *RADIOLOGY REPORT*  Clinical Data: Chronic mid to upper back pain.  No injury.  THORACIC SPINE - 2 VIEW + SWIMMERS  Comparison: Prior CT chest 03/15/2012.  Findings: Methylmethacrylate is seen in the T12 vertebral body from previous vertebral augmentation. Since the previous CT, there is a new T9 compression fracture which was not present previously.  Its age however is indeterminate based on this radiographic examination.  Slight retropulsion is suggested of the inferior endplate.  Dual lead transvenous pacemaker from a  left subclavian approach. Skeletal osteopenia.  Advanced vascular calcification.  IMPRESSION: Indeterminate age T9 compression fracture with slight retropulsion. This compression fracture is  new however when compared with July 2013.   Original Report Authenticated By: Davonna Belling, M.D.    Dg Chest Port 1 View  09/11/2012  *RADIOLOGY REPORT*  Clinical Data: Congestive heart failure  PORTABLE CHEST - 1 VIEW  Comparison: 09/10/2012  Findings: Dual lead left subclavian pacemaker device is stable. Low volumes are worse.  Bibasilar atelectasis verses airspace disease.  Vascular congestion.  No pneumothorax.  Postoperative changes from vertebroplasty.  IMPRESSION: Stable bibasilar atelectasis verses airspace disease.   Original Report Authenticated By: Jolaine Click, M.D.    Dg Chest Portable 1 View  09/10/2012  *RADIOLOGY REPORT*  Clinical Data: Cough, shortness of breath and hypertension.  PORTABLE CHEST - 1 VIEW  Comparison: 08/25/2012.  Findings: Trachea is midline.  Heart is enlarged.  There is bibasilar air space disease.  No definite pleural fluid.  Pacemaker lead tips project over the right atrium and right ventricle. Vertebroplasty is seen in the lower thoracic or upper lumbar spine.  IMPRESSION: Bibasilar air space disease, new from 08/25/2012.   Original Report Authenticated By: Leanna Battles, M.D.    Dg Abd Acute W/chest  08/25/2012  *RADIOLOGY REPORT*  Clinical Data: The abdominal pain and swelling.  ACUTE ABDOMEN SERIES (ABDOMEN 2 VIEW & CHEST 1 VIEW)  Comparison: Chest x-ray 06/28/2012.  Findings: Lung volumes are normal.  Interstitial prominence throughout the left lung.  No definite consolidative airspace disease.  Probable left lower lobe subsegmental atelectasis. Possible trace left pleural effusion.  Mild cephalization of the pulmonary vasculature, without frank pulmonary edema.  Mild cardiomegaly. The patient is rotated to the left on today's exam, resulting in distortion of the mediastinal contours and reduced diagnostic sensitivity and specificity for mediastinal pathology. Atherosclerosis in the thoracic aorta.  Left-sided pacemaker device in place with lead tips projecting in  the expected location of the right atrium and right ventricular apex.  Supine and upright views of the abdomen  demonstrate gas and stool scattered throughout the colon extending to the level of the distal rectum.  No definite pathologic distension of small bowel is noted. No pneumoperitoneum.  Extensive vascular calcifications are noted. Post procedural changes of vertebroplasty are noted in the upper lumbar spine.  Right hip hemiarthroplasty is incidentally noted and incompletely visualized.  IMPRESSION: 1.  Nonobstructive bowel gas pattern. 2.  No pneumoperitoneum. 3.  Diffuse interstitial prominence throughout the left lung is of uncertain etiology and significance, but clinical correlation for signs and symptoms of bronchopneumonia is recommended. 4.  Mild cardiomegaly with pulmonary venous congestion, but no frank pulmonary edema. 5.  Atherosclerosis.   Original Report Authenticated By: Trudie Reed, M.D.     Microbiology: Recent Results (from the past 240 hour(s))  CULTURE, BLOOD (ROUTINE X 2)     Status: Normal (Preliminary result)   Collection Time   09/10/12  4:35 PM      Component Value Range Status Comment   Specimen Description BLOOD LEFT WRIST   Final    Special Requests BOTTLES DRAWN AEROBIC AND ANAEROBIC 6CC   Final    Culture NO GROWTH 2 DAYS   Final    Report Status PENDING   Incomplete   CULTURE, BLOOD (ROUTINE X 2)     Status: Normal (Preliminary result)   Collection Time   09/10/12  5:30 PM      Component Value Range Status Comment   Specimen Description BLOOD RIGHT HAND   Final    Special Requests BOTTLES DRAWN AEROBIC ONLY 6CC   Final    Culture NO GROWTH 2 DAYS   Final    Report Status PENDING   Incomplete   MRSA PCR SCREENING     Status: Normal   Collection Time   09/10/12  5:45 PM      Component Value Range Status Comment   MRSA by PCR NEGATIVE  NEGATIVE Final      Labs: Basic Metabolic Panel:  Lab 09/12/12 1610 09/11/12 0431 09/10/12 1346  NA 139 137 135  K 3.8  2.7* 3.2*  CL 104 101 99  CO2 26 26 25   GLUCOSE 95 89 162*  BUN 20 17 21   CREATININE 1.34* 1.12* 1.23*  CALCIUM 10.1 9.7 10.0  MG -- -- --  PHOS -- -- --   Liver Function Tests:  Lab 09/11/12 0431  AST 20  ALT 20  ALKPHOS 97  BILITOT 0.5  PROT 6.5  ALBUMIN 2.7*     CBC:  Lab 09/11/12 0431 09/10/12 1346  WBC 10.9* 14.7*  NEUTROABS -- 12.6*  HGB 12.1 12.7  HCT 37.6 39.5  MCV 86.6 87.4  PLT 316 335     Basename 09/11/12 0431 03/14/12 2043  PROBNP 7411.0* 1320.0*         Signed:  GOSRANI,NIMISH C  [facility] 09/12/2012, 9:33 AM

## 2012-09-12 NOTE — Clinical Social Work Note (Signed)
CSW spoke to son and patient about PT recommendation for SNF placement.  Marchel[facility] Folks of [facility] also assessed patient yesterday and said facility can handle her rehab needs.  Spoke w patient and son, [name].  Patient and son want patient to return to [facility] at discharge and have made appropriate arrangements w facility for this.  Plan is for patient to return to [facility] today, son will transport to facility approx 11:30 AM.  Santa Genera, LCSW Clinical Social Worker [phone])

## 2012-09-15 LAB — CULTURE, BLOOD (ROUTINE X 2)
Culture: NO GROWTH
Culture: NO GROWTH

## 2012-09-17 IMAGING — CR DG ANKLE COMPLETE 3+V*L*
3 series · 3 of 3 positions shown · non-contrast
Comparison: None

CLINICAL DATA: Fell yesterday.  Lateral ankle pain.

LEFT ANKLE COMPLETE - 3+ VIEW

[view not recorded (1 of 3)]
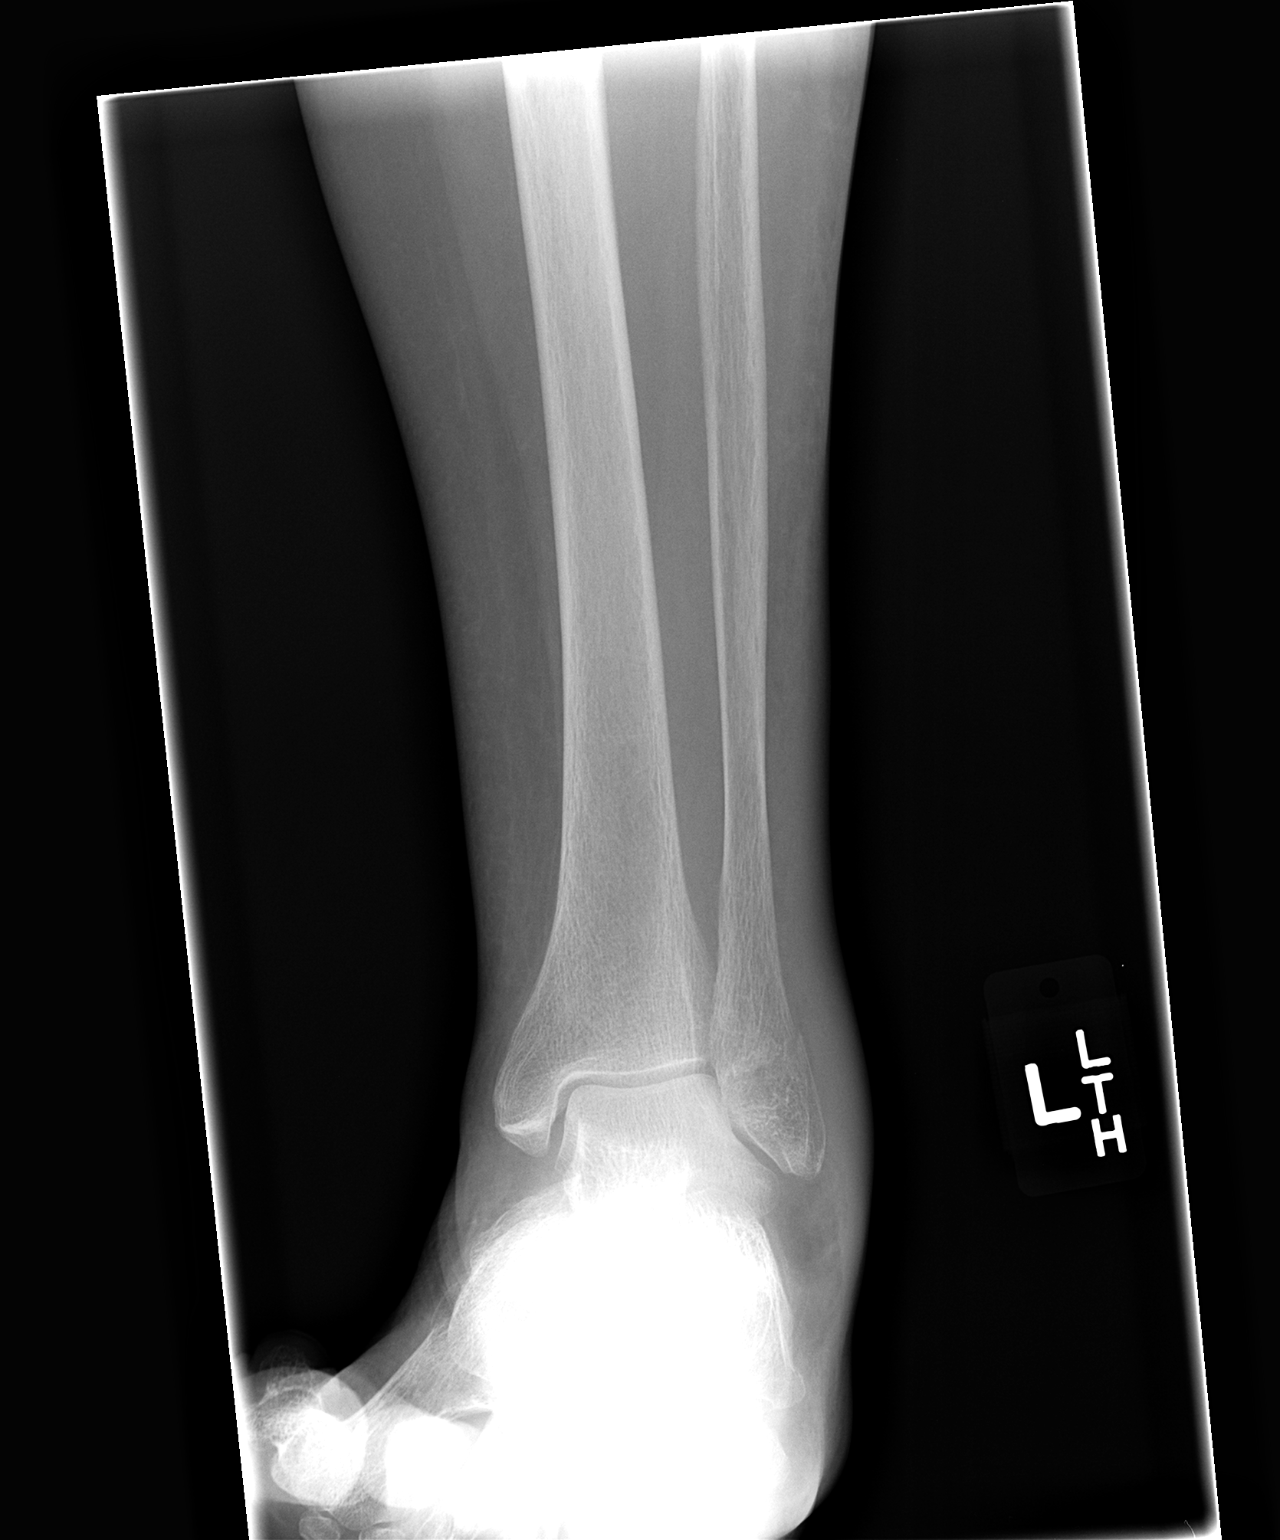

[view not recorded (2 of 3)]
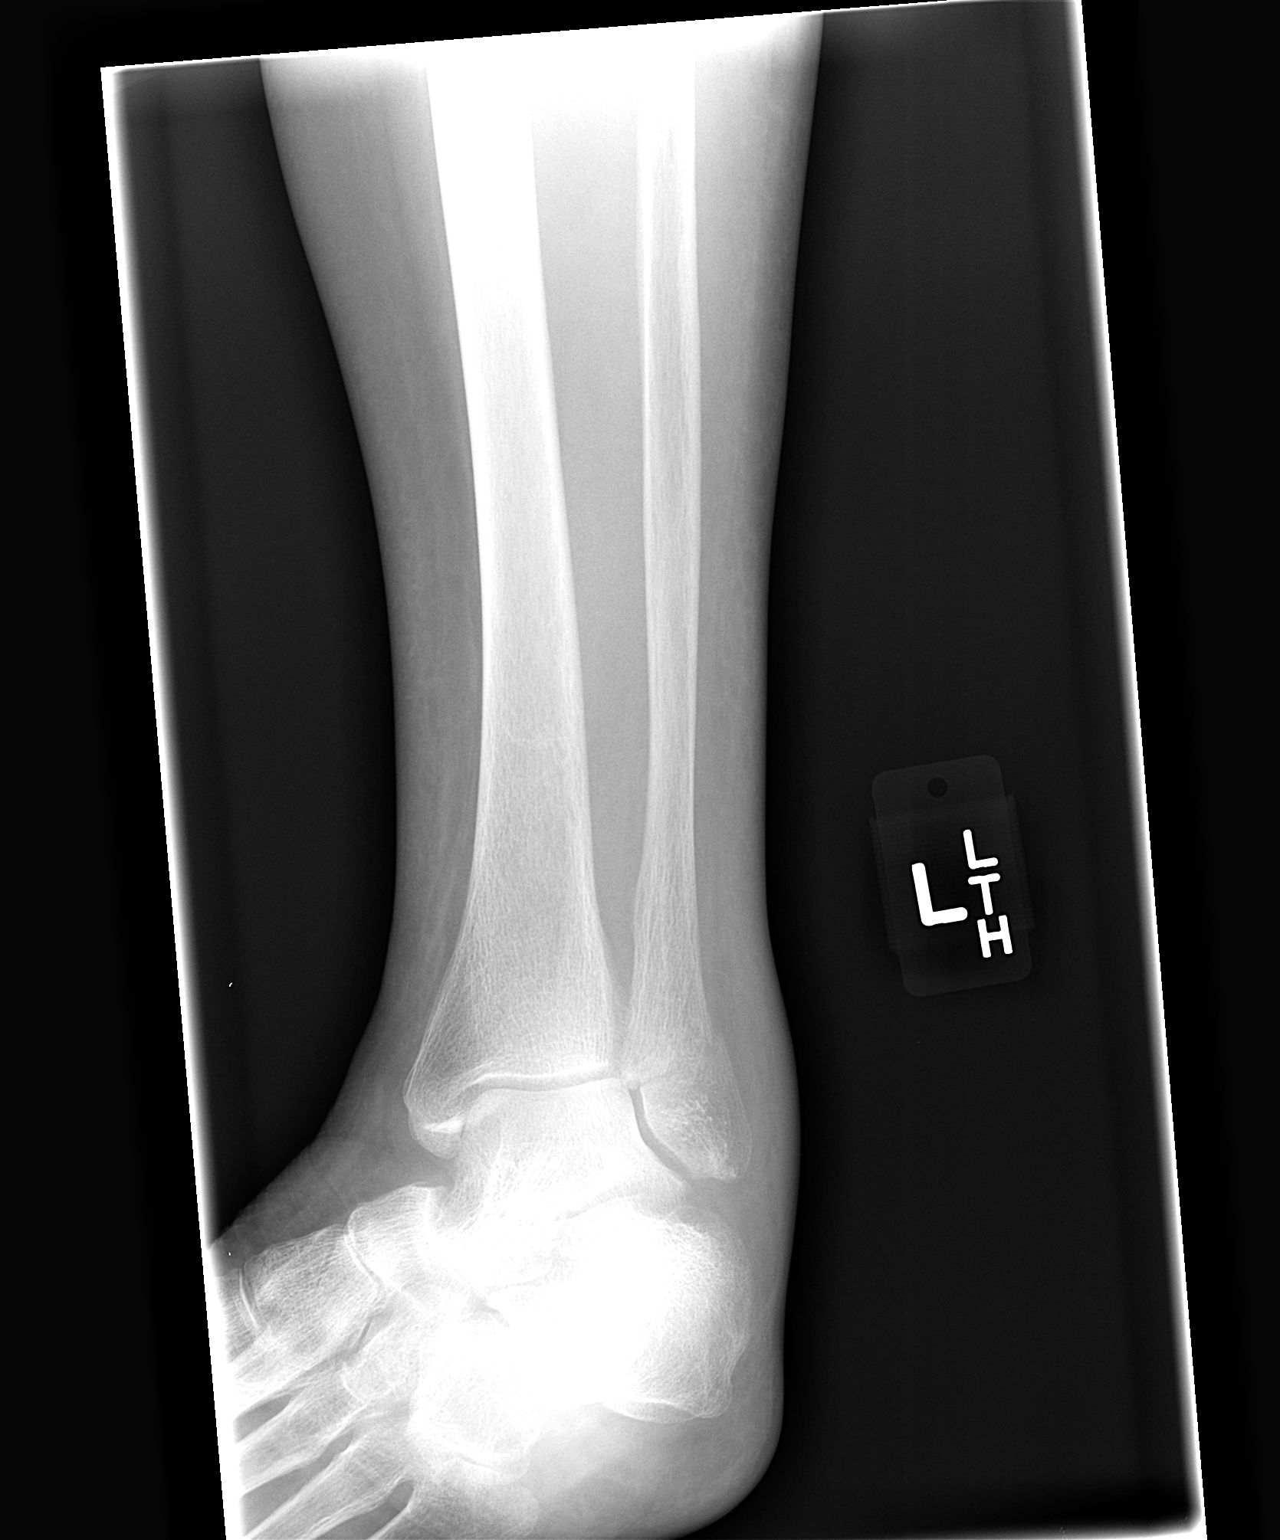

[view not recorded (3 of 3)]
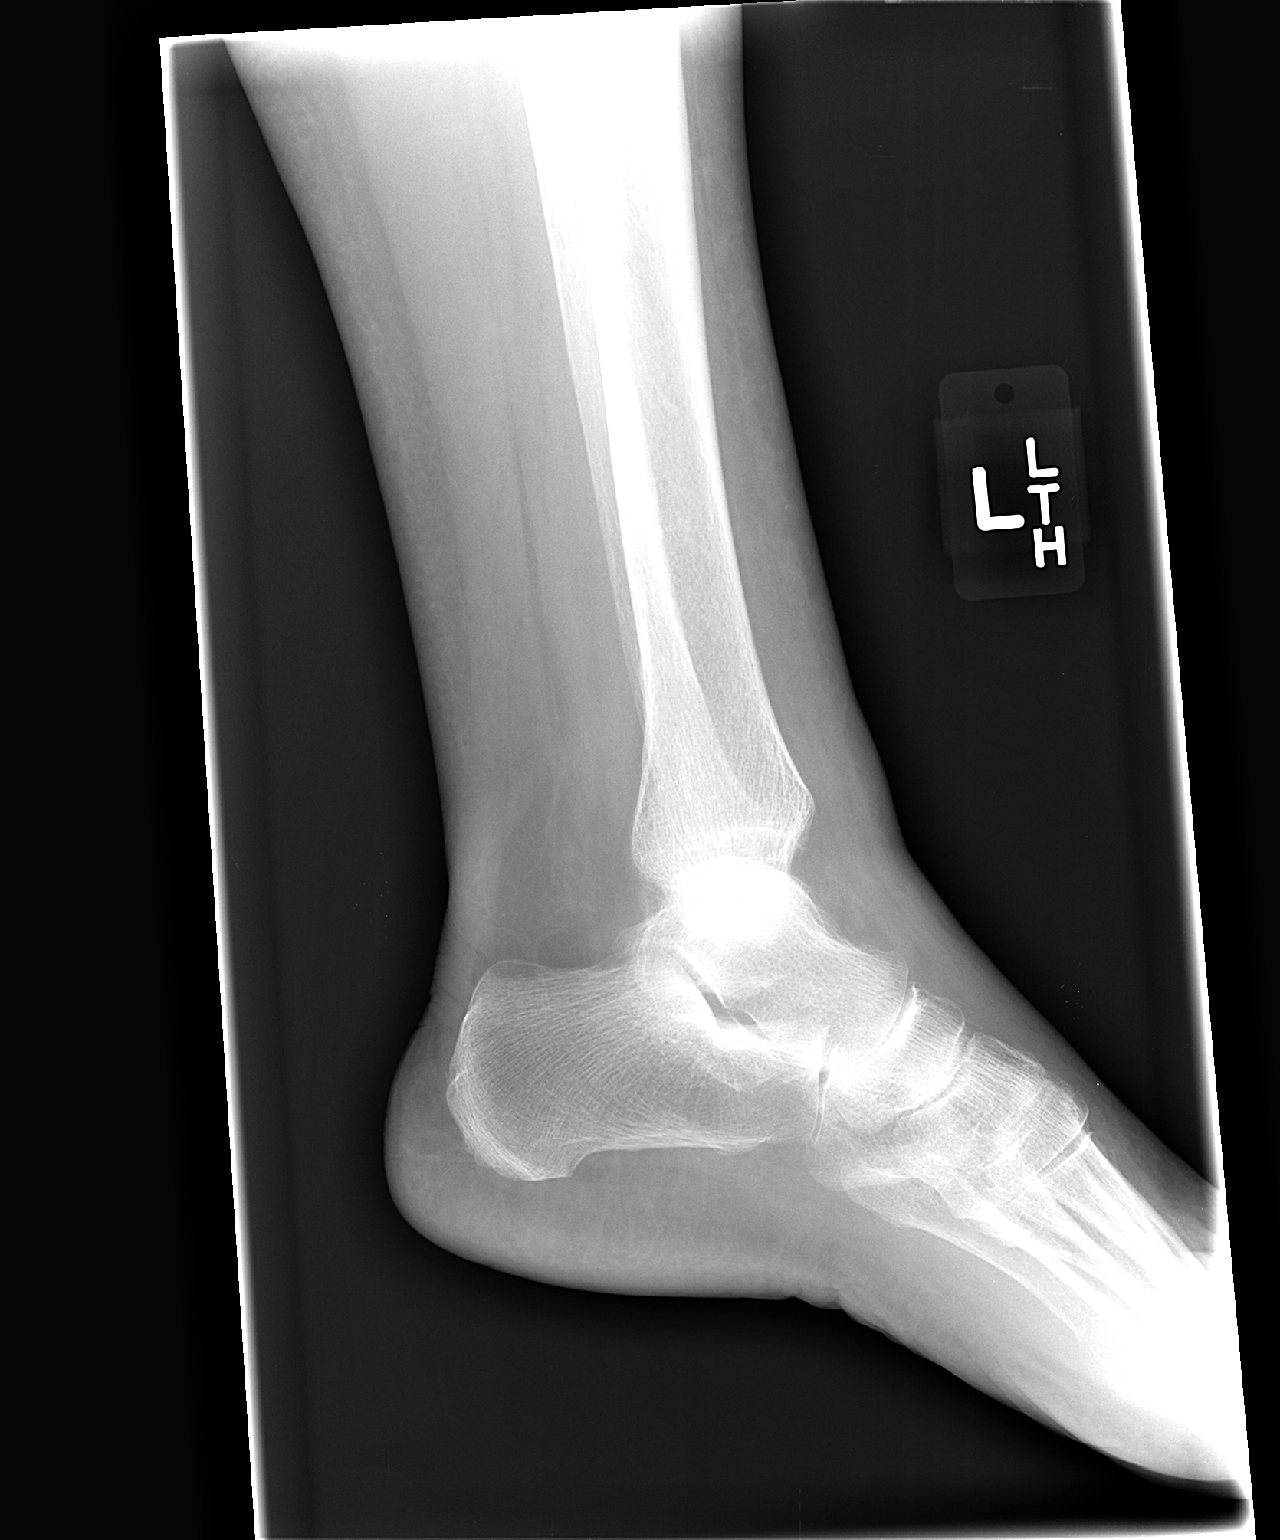

[3 of 3 positions shown; findings below may reference images not displayed]

FINDINGS: There is diffuse soft tissue swelling about the ankle,
especially notable in the lateral aspect.  There is a fracture of
the lateral malleolus.  This is associated minimal displacement.
No other fractures are identified.  No radiopaque foreign body or
soft tissue gas.
IMPRESSION: 1.  Lateral malleolar fracture.
2.  Significant soft tissue swelling.

## 2012-10-01 ENCOUNTER — Other Ambulatory Visit (HOSPITAL_COMMUNITY): Payer: Self-pay | Admitting: Internal Medicine

## 2012-10-01 DIAGNOSIS — R1084 Generalized abdominal pain: Secondary | ICD-10-CM

## 2012-10-01 DIAGNOSIS — N189 Chronic kidney disease, unspecified: Secondary | ICD-10-CM

## 2012-10-06 ENCOUNTER — Encounter (HOSPITAL_COMMUNITY): Payer: Self-pay

## 2012-10-06 ENCOUNTER — Ambulatory Visit (HOSPITAL_COMMUNITY)
Admission: RE | Admit: 2012-10-06 | Discharge: 2012-10-06 | Disposition: A | Discharge status: Home / Self Care | Payer: Medicare Other | Source: Ambulatory Visit | Attending: Internal Medicine | Admitting: Internal Medicine

## 2012-10-06 DIAGNOSIS — N189 Chronic kidney disease, unspecified: Secondary | ICD-10-CM | POA: Insufficient documentation

## 2012-10-06 DIAGNOSIS — R110 Nausea: Secondary | ICD-10-CM | POA: Insufficient documentation

## 2012-10-06 DIAGNOSIS — K769 Liver disease, unspecified: Secondary | ICD-10-CM | POA: Insufficient documentation

## 2012-10-06 DIAGNOSIS — M899 Disorder of bone, unspecified: Secondary | ICD-10-CM | POA: Insufficient documentation

## 2012-10-06 DIAGNOSIS — K429 Umbilical hernia without obstruction or gangrene: Secondary | ICD-10-CM | POA: Insufficient documentation

## 2012-10-06 DIAGNOSIS — R933 Abnormal findings on diagnostic imaging of other parts of digestive tract: Secondary | ICD-10-CM | POA: Insufficient documentation

## 2012-10-06 DIAGNOSIS — R1084 Generalized abdominal pain: Secondary | ICD-10-CM

## 2012-10-06 DIAGNOSIS — R109 Unspecified abdominal pain: Secondary | ICD-10-CM | POA: Insufficient documentation

## 2012-10-06 DIAGNOSIS — K5730 Diverticulosis of large intestine without perforation or abscess without bleeding: Secondary | ICD-10-CM | POA: Insufficient documentation

## 2012-11-21 ENCOUNTER — Ambulatory Visit: Payer: Self-pay | Admitting: Cardiovascular Disease

## 2012-11-21 DIAGNOSIS — Z7901 Long term (current) use of anticoagulants: Secondary | ICD-10-CM

## 2012-11-21 DIAGNOSIS — I480 Paroxysmal atrial fibrillation: Secondary | ICD-10-CM

## 2013-01-16 ENCOUNTER — Inpatient Hospital Stay (HOSPITAL_COMMUNITY)
Admission: EM | Admit: 2013-01-16 | Discharge: 2013-01-21 | DRG: 291 | Disposition: A | Discharge status: Nursing Home (Includes ICF) | Payer: Medicare Other | Attending: Internal Medicine | Admitting: Internal Medicine

## 2013-01-16 ENCOUNTER — Emergency Department (HOSPITAL_COMMUNITY): Payer: Medicare Other

## 2013-01-16 ENCOUNTER — Encounter (HOSPITAL_COMMUNITY): Payer: Self-pay | Admitting: *Deleted

## 2013-01-16 DIAGNOSIS — M818 Other osteoporosis without current pathological fracture: Secondary | ICD-10-CM | POA: Diagnosis present

## 2013-01-16 DIAGNOSIS — Z950 Presence of cardiac pacemaker: Secondary | ICD-10-CM

## 2013-01-16 DIAGNOSIS — I495 Sick sinus syndrome: Secondary | ICD-10-CM | POA: Diagnosis present

## 2013-01-16 DIAGNOSIS — I509 Heart failure, unspecified: Secondary | ICD-10-CM | POA: Diagnosis present

## 2013-01-16 DIAGNOSIS — Z6826 Body mass index (BMI) 26.0-26.9, adult: Secondary | ICD-10-CM

## 2013-01-16 DIAGNOSIS — Z96649 Presence of unspecified artificial hip joint: Secondary | ICD-10-CM

## 2013-01-16 DIAGNOSIS — I5032 Chronic diastolic (congestive) heart failure: Secondary | ICD-10-CM | POA: Diagnosis present

## 2013-01-16 DIAGNOSIS — I480 Paroxysmal atrial fibrillation: Secondary | ICD-10-CM | POA: Diagnosis present

## 2013-01-16 DIAGNOSIS — I2510 Atherosclerotic heart disease of native coronary artery without angina pectoris: Secondary | ICD-10-CM | POA: Diagnosis present

## 2013-01-16 DIAGNOSIS — J9600 Acute respiratory failure, unspecified whether with hypoxia or hypercapnia: Secondary | ICD-10-CM

## 2013-01-16 DIAGNOSIS — I10 Essential (primary) hypertension: Secondary | ICD-10-CM | POA: Diagnosis present

## 2013-01-16 DIAGNOSIS — E7800 Pure hypercholesterolemia, unspecified: Secondary | ICD-10-CM | POA: Diagnosis present

## 2013-01-16 DIAGNOSIS — Z7901 Long term (current) use of anticoagulants: Secondary | ICD-10-CM

## 2013-01-16 DIAGNOSIS — I4891 Unspecified atrial fibrillation: Secondary | ICD-10-CM

## 2013-01-16 DIAGNOSIS — N289 Disorder of kidney and ureter, unspecified: Secondary | ICD-10-CM

## 2013-01-16 DIAGNOSIS — F411 Generalized anxiety disorder: Secondary | ICD-10-CM | POA: Diagnosis present

## 2013-01-16 DIAGNOSIS — Z86711 Personal history of pulmonary embolism: Secondary | ICD-10-CM

## 2013-01-16 DIAGNOSIS — E669 Obesity, unspecified: Secondary | ICD-10-CM | POA: Diagnosis present

## 2013-01-16 DIAGNOSIS — N179 Acute kidney failure, unspecified: Secondary | ICD-10-CM | POA: Diagnosis present

## 2013-01-16 DIAGNOSIS — R0602 Shortness of breath: Secondary | ICD-10-CM

## 2013-01-16 DIAGNOSIS — Z79899 Other long term (current) drug therapy: Secondary | ICD-10-CM

## 2013-01-16 DIAGNOSIS — E039 Hypothyroidism, unspecified: Secondary | ICD-10-CM | POA: Diagnosis present

## 2013-01-16 DIAGNOSIS — I5033 Acute on chronic diastolic (congestive) heart failure: Principal | ICD-10-CM | POA: Diagnosis present

## 2013-01-16 DIAGNOSIS — J441 Chronic obstructive pulmonary disease with (acute) exacerbation: Secondary | ICD-10-CM | POA: Diagnosis present

## 2013-01-16 LAB — BASIC METABOLIC PANEL
Calcium: 10 mg/dL (ref 8.4–10.5)
Chloride: 102 mEq/L (ref 96–112)
Creatinine, Ser: 2.04 mg/dL — ABNORMAL HIGH (ref 0.50–1.10)
GFR calc Af Amer: 25 mL/min — ABNORMAL LOW (ref >90)
Sodium: 140 mEq/L (ref 135–145)

## 2013-01-16 LAB — PRO B NATRIURETIC PEPTIDE: Pro B Natriuretic peptide (BNP): 2857 pg/mL — ABNORMAL HIGH (ref 0–450)

## 2013-01-16 LAB — CBC WITH DIFFERENTIAL/PLATELET
Basophils Absolute: 0 10*3/uL (ref 0.0–0.1)
Lymphocytes Relative: 25 % (ref 12–46)
Neutro Abs: 3.1 10*3/uL (ref 1.7–7.7)
Neutrophils Relative %: 57 % (ref 43–77)
Platelets: 236 10*3/uL (ref 150–400)
RDW: 15.8 % — ABNORMAL HIGH (ref 11.5–15.5)
WBC: 5.3 10*3/uL (ref 4.0–10.5)

## 2013-01-16 LAB — TROPONIN I: Troponin I: <0.3 ng/mL (ref <0.30)

## 2013-01-16 LAB — PROTIME-INR
INR: 2.91 — ABNORMAL HIGH (ref 0.00–1.49)
Prothrombin Time: 28.9 seconds — ABNORMAL HIGH (ref 11.6–15.2)

## 2013-01-16 MED ORDER — SODIUM CHLORIDE 0.9 % IJ SOLN: 3.0000 mL | INTRAMUSCULAR | Status: DC | PRN

## 2013-01-16 MED ORDER — GABAPENTIN 300 MG PO CAPS
300.0000 mg | ORAL_CAPSULE | Freq: Three times a day (TID) | ORAL | Status: DC
  Administered 2013-01-17 – 2013-01-21 (×14): 300 mg via ORAL
  Filled 2013-01-16 (×14): qty 1

## 2013-01-16 MED ORDER — BIMATOPROST 0.01 % OP SOLN
1.0000 [drp] | Freq: Every day | OPHTHALMIC | Status: DC
  Administered 2013-01-17 – 2013-01-20 (×4): 1 [drp] via OPHTHALMIC
  Filled 2013-01-16: qty 2.5

## 2013-01-16 MED ORDER — ALBUTEROL SULFATE (5 MG/ML) 0.5% IN NEBU
2.5000 mg | INHALATION_SOLUTION | Freq: Four times a day (QID) | RESPIRATORY_TRACT | Status: DC | PRN
  Administered 2013-01-18 – 2013-01-20 (×2): 2.5 mg via RESPIRATORY_TRACT
  Filled 2013-01-16 (×2): qty 0.5

## 2013-01-16 MED ORDER — POTASSIUM CHLORIDE CRYS ER 20 MEQ PO TBCR
40.0000 meq | EXTENDED_RELEASE_TABLET | Freq: Two times a day (BID) | ORAL | Status: DC
  Administered 2013-01-17 – 2013-01-21 (×10): 40 meq via ORAL
  Filled 2013-01-16 (×10): qty 2

## 2013-01-16 MED ORDER — FUROSEMIDE 10 MG/ML IJ SOLN
80.0000 mg | Freq: Once | INTRAMUSCULAR | Status: AC
  Administered 2013-01-16: 80 mg via INTRAVENOUS
  Filled 2013-01-16: qty 8

## 2013-01-16 MED ORDER — SODIUM CHLORIDE 0.9 % IJ SOLN
3.0000 mL | Freq: Two times a day (BID) | INTRAMUSCULAR | Status: DC
  Administered 2013-01-17 – 2013-01-21 (×7): 3 mL via INTRAVENOUS

## 2013-01-16 MED ORDER — ALPRAZOLAM 0.25 MG PO TABS
0.2500 mg | ORAL_TABLET | Freq: Every day | ORAL | Status: DC | PRN
  Administered 2013-01-17 – 2013-01-20 (×5): 0.25 mg via ORAL
  Filled 2013-01-16 (×5): qty 1

## 2013-01-16 MED ORDER — LORATADINE 10 MG PO TABS
10.0000 mg | ORAL_TABLET | Freq: Every day | ORAL | Status: DC
  Administered 2013-01-17 – 2013-01-21 (×5): 10 mg via ORAL
  Filled 2013-01-16 (×5): qty 1

## 2013-01-16 MED ORDER — DOCUSATE SODIUM 100 MG PO CAPS
100.0000 mg | ORAL_CAPSULE | Freq: Two times a day (BID) | ORAL | Status: DC
  Administered 2013-01-17 – 2013-01-21 (×10): 100 mg via ORAL
  Filled 2013-01-16 (×10): qty 1

## 2013-01-16 MED ORDER — BRIMONIDINE TARTRATE 0.15 % OP SOLN
1.0000 [drp] | Freq: Two times a day (BID) | OPHTHALMIC | Status: DC
  Administered 2013-01-17 – 2013-01-21 (×9): 1 [drp] via OPHTHALMIC
  Filled 2013-01-16: qty 5

## 2013-01-16 MED ORDER — IPRATROPIUM BROMIDE 0.02 % IN SOLN
0.5000 mg | Freq: Four times a day (QID) | RESPIRATORY_TRACT | Status: DC | PRN
  Administered 2013-01-18 – 2013-01-20 (×2): 0.5 mg via RESPIRATORY_TRACT
  Filled 2013-01-16 (×2): qty 2.5

## 2013-01-16 MED ORDER — SODIUM CHLORIDE 0.9 % IV SOLN: 250.0000 mL | INTRAVENOUS | Status: DC | PRN

## 2013-01-16 MED ORDER — AMIODARONE HCL 200 MG PO TABS
100.0000 mg | ORAL_TABLET | Freq: Every day | ORAL | Status: DC
  Administered 2013-01-17 – 2013-01-21 (×5): 100 mg via ORAL
  Filled 2013-01-16 (×5): qty 1

## 2013-01-16 MED ORDER — LEVOTHYROXINE SODIUM 100 MCG PO TABS
100.0000 ug | ORAL_TABLET | Freq: Every day | ORAL | Status: DC
  Administered 2013-01-17 – 2013-01-21 (×5): 100 ug via ORAL
  Filled 2013-01-16 (×5): qty 1

## 2013-01-16 MED ORDER — ONDANSETRON HCL 4 MG/2ML IJ SOLN: 4.0000 mg | INTRAMUSCULAR | Status: DC | PRN

## 2013-01-16 MED ORDER — DORZOLAMIDE HCL 2 % OP SOLN
1.0000 [drp] | Freq: Two times a day (BID) | OPHTHALMIC | Status: DC
  Administered 2013-01-17 – 2013-01-21 (×9): 1 [drp] via OPHTHALMIC
  Filled 2013-01-16: qty 10

## 2013-01-16 MED ORDER — FUROSEMIDE 10 MG/ML IJ SOLN: 80.0000 mg | Freq: Two times a day (BID) | INTRAMUSCULAR | Status: DC

## 2013-01-16 MED ORDER — POLYETHYLENE GLYCOL 3350 17 GM/SCOOP PO POWD
17.0000 g | Freq: Every day | ORAL | Status: DC
  Filled 2013-01-16: qty 255

## 2013-01-16 MED ORDER — SIMVASTATIN 10 MG PO TABS
10.0000 mg | ORAL_TABLET | Freq: Every day | ORAL | Status: DC
  Administered 2013-01-17 – 2013-01-20 (×4): 10 mg via ORAL
  Filled 2013-01-16 (×4): qty 1

## 2013-01-16 NOTE — ED Provider Notes (Signed)
History    This chart was scribed for Charles B. Bernette Mayers, MD by Toya Smothers, ED Scribe. The patient was seen in room APA05/APA05. Patient's care was started at 1608   CSN: [id number]  Arrival date & time 01/16/13  1608   First MD Initiated Contact with Patient 01/16/13 1642      Chief Complaint  Patient presents with  . Shortness of Breath    HPI  HPI Comments: [name] is a 79 y.o. female with h/o HTN and pulmonary embolism, BIB EMS from [facility], who presents to to the Emergency Department complaining of 4 days of unchanged, gradual onset, constat mild SOB without aggrevation. Despite having a nebulizer treatment this morning, there has been no improvement. Per EMS, Pt was tachycardic on arrival. Pt denies headache, diaphoresis, fever, chills, nausea, vomiting, diarrhea, weakness, cough, SOB and any other pain. Current medications include Albuterol, Lasix, Neurontin, levothyroxine, and Coumadin. Medical Hx includes HTN, CAD,and  Arrhythmia. Pt has been living at the [facility] for 1.5 years. Pt denies use of tobacco, alcohol, and illicit drug use.     Past Medical History  Diagnosis Date  . Hypertension   . High cholesterol   . Arthritis   . Hypothyroidism   . Arrhythmia     pacemaker  . Glaucoma(365)   . Pulmonary embolism 06/04/11  . Coronary artery disease   . Atrial fibrillation   . 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 Anxiety     Past Surgical History  Procedure Laterality Date  . Total hip arthroplasty      right side  . Pacemaker insertion    . Appendectomy    . Abdominal hysterectomy      History reviewed. No pertinent family history.  History  Substance Use Topics  . Smoking status: Never Smoker   . Smokeless tobacco: Not on file  . Alcohol Use: No    Review of Systems  Respiratory: Positive for shortness of breath.   All other systems reviewed and are negative.    Allergies  Naproxen and Robaxin  Home  Medications   Current Outpatient Rx  Name  Route  Sig  Dispense  Refill  . acetaminophen (TYLENOL) 325 MG tablet   Oral   Take 650 mg by mouth every 6 (six) hours as needed.         Marland Kitchen albuterol (PROVENTIL) (2.5 MG/3ML) 0.083% nebulizer solution   Nebulization   Take 2.5 mg by nebulization every 6 (six) hours as needed. For shortness of breath         . ALPRAZolam (XANAX) 0.25 MG tablet   Oral   Take 0.25 mg by mouth daily as needed. Anxiety         . amiodarone (PACERONE) 200 MG tablet   Oral   Take 200 mg by mouth daily.          . bimatoprost (LUMIGAN) 0.01 % SOLN   Ophthalmic   Apply 1 drop to eye at bedtime.         . brimonidine (ALPHAGAN P) 0.1 % SOLN   Both Eyes   Place 1 drop into both eyes 2 (two) times daily.         . Calcium Carbonate-Vitamin D (CALCIUM 600 + D PO)   Oral   Take 1 tablet by mouth 2 (two) times daily.         Marland Kitchen docusate sodium (COLACE) 100 MG capsule   Oral   Take  100 mg by mouth 2 (two) times daily.         . dorzolamide (TRUSOPT) 2 % ophthalmic solution   Both Eyes   Place 1 drop into both eyes 2 (two) times daily.         . furosemide (LASIX) 80 MG tablet   Oral   Take 1 tablet (80 mg total) by mouth 2 (two) times daily.   60 tablet   0   . gabapentin (NEURONTIN) 300 MG capsule   Oral   Take 300 mg by mouth 3 (three) times daily.          Marland Kitchen ibuprofen (ADVIL,MOTRIN) 400 MG tablet   Oral   Take 400 mg by mouth every 6 (six) hours as needed for pain.         Marland Kitchen levothyroxine (SYNTHROID, LEVOTHROID) 100 MCG tablet   Oral   Take 100 mcg by mouth daily.         . metoprolol (LOPRESSOR) 50 MG tablet   Oral   Take 50 mg by mouth every morning.         . ondansetron (ZOFRAN) 4 MG tablet   Oral   Take 4 mg by mouth every 6 (six) hours as needed. For nausea         . oxyCODONE-acetaminophen (PERCOCET) 5-325 MG per tablet   Oral   Take 1 tablet by mouth every 4 (four) hours as needed for pain.   20  tablet   0   . polyethylene glycol powder (GLYCOLAX/MIRALAX) powder   Oral   Take 17 g by mouth daily.          . potassium chloride SA (K-DUR,KLOR-CON) 20 MEQ tablet   Oral   Take 2 tablets (40 mEq total) by mouth daily.         . pravastatin (PRAVACHOL) 20 MG tablet   Oral   Take 20 mg by mouth daily.         Marland Kitchen trimethoprim (TRIMPEX) 100 MG tablet   Oral   Take 100 mg by mouth at bedtime.         Marland Kitchen warfarin (COUMADIN) 4 MG tablet   Oral   Take 4-6 mg by mouth daily. Take one & one-half tablet on Mondays only. Take one tablet on all other days         . Alum & Mag Hydroxide-Simeth (MAGIC MOUTHWASH) SOLN   Oral   Take 10 mLs by mouth 4 (four) times daily as needed.           BP 156/95  Pulse 89  Temp(Src) 97.3 F (36.3 C) (Oral)  Ht 5\' 3"  (1.6 m)  Wt 153 lb (69.4 kg)  BMI 27.11 kg/m2  SpO2 100%  Physical Exam  Nursing note and vitals reviewed. Constitutional: She is oriented to person, place, and time. She appears well-developed and well-nourished.  HENT:  Head: Normocephalic and atraumatic.  Eyes: EOM are normal. Pupils are equal, round, and reactive to light.  Neck: Normal range of motion. Neck supple.  Cardiovascular: Normal rate, normal heart sounds and intact distal pulses.   Implantable cardioverter defibrillator noted  Pulmonary/Chest: Effort normal and breath sounds normal.  Abdominal: Bowel sounds are normal. She exhibits no distension. There is no tenderness.  Musculoskeletal: Normal range of motion. She exhibits no tenderness.  Trace edema bilateral lower extremities  Neurological: She is alert and oriented to person, place, and time. She has normal strength. No cranial nerve deficit or sensory deficit.  Skin: Skin is warm and dry. No rash noted.  Psychiatric: She has a normal mood and affect.    ED Course  Procedures DIAGNOSTIC STUDIES: Oxygen Saturation is 100% on [place], normal by my interpretation.    COORDINATION OF CARE: 16:43-  Ordered DG Chest 2 View 1 time imaging  16:47- Evaluated Pt. Pt is awake, alert, and without distress. 16:51- Ordered Basic metabolic panel, CBC with Differential, and Urinalysis, Routine w reflex microscopic. 16:52- Patient understand and agree with initial ED impression and plan with expectations set for ED visit.     Labs Reviewed  BASIC METABOLIC PANEL  CBC WITH DIFFERENTIAL  TROPONIN I  PRO B NATRIURETIC PEPTIDE  PROTIME-INR   Dg Chest 2 View  01/16/2013   *RADIOLOGY REPORT*  Clinical Data: Shortness of breath for 3 days  CHEST - 2 VIEW  Comparison: 09/11/2012  Findings: There is stable cardiomegaly.  Left chest wall dual lead pacer appears stable, leads terminating in the expected locations of the right atrium and right ventricle.  There is heavy atherosclerotic calcification of the thoracic aortic arch.  Lung volumes are low bilaterally.  There is pulmonary vascular congestion.  There is interstitial prominence bilaterally.  No visible pleural effusion or pneumothorax.  The bones are markedly osteopenic.  Vertebroplasty changes are noted at a vertebral body near the thoracolumbar junction.  The exact number of these vertebral bodies is difficult due to the osteopenia.  IMPRESSION:  1.  Cardiomegaly with pulmonary vascular congestion.  Question mild interstitial pulmonary edema versus chronic interstitial prominence. 2.  Dual lead pacemaker. 3.  Marked osteopenia and prior vertebroplasty visualized near the thoracolumbar junction.   Original Report Authenticated By: Britta Mccreedy, M.D.     1. CHF exacerbation   2. SOB (shortness of breath)   3. Renal insufficiency       MDM   Date: 01/16/2013  Rate: 90  Rhythm: V paced  QRS Axis: indeterminate  Intervals: V paced  ST/T Wave abnormalities: indeterminate  Conduction Disutrbances:paced  Narrative Interpretation:   Old EKG Reviewed: none available    Pt with SOB and vascular congestion has elevation in creatinine above  baseline and elevated BNP. Given Lasix in the ED, plan admission for CHF exacerbation.     I personally performed the services described in this documentation, which was scribed in my presence. The recorded information has been reviewed and is accurate.     Charles B. Bernette Mayers, MD 01/17/13 [phone]

## 2013-01-16 NOTE — H&P (Signed)
[facility] History and Physical  [name]  WUJ:[id number]  DOB: May 10, 1927   DOA: 01/16/2013   PCP:   Cassell Smiles., MD  Cardiologist: Dr. Salena Saner., with [facility]  Chief Complaint:  Difficulty breathing for one week  HPI: [name] is an 79 y.o. female.   Elderly Caucasian lady resident of an assisted living facility, history of CHF, mobile only by wheelchair but usually able to transfer to the commode, presents with progressive shortness of breath for one week. Shortness of breath is severely aggravated by exertion of transfer to commode, associated with orthopnea, and this interferes with sleep, requiring frequent adjustments to position. She has been treated with nebulization, with minimal improvement.  She reports she is weighed daily at the assisted living, and that her weight fluctuates up and down. She feels she has been losing weight. She does admit to dietary indiscretion on Mother's Day.  The patient has been taking ibuprofen for the few months for osteoarthritis She is chronically anticoagulated for paroxysmal atrial fibrillation, and wears a pacemaker for tachybradycardia syndrome  Rewiew of Systems:   All systems negative except as marked bold or noted in the HPI;  Constitutional:    malaise, fever and chills. ;  Eyes:   eye pain, redness and discharge. ;  ENMT:   ear pain, hoarseness, nasal congestion, sinus pressure and sore throat. ;  Cardiovascular:    chest pain, palpitations, diaphoresis, dyspnea; paroxysmal nocturnal dyspnea; and peripheral edema.  Respiratory:   cough for one month, hemoptysis, wheezing and stridor. ;  Gastrointestinal:  nausea, vomiting, diarrhea, constipation, abdominal pain, melena, blood in stool, hematemesis, jaundice and rectal bleeding. unusual weight loss..   Genitourinary:    frequency, dysuria, incontinence,flank pain and hematuria; Musculoskeletal:   back pain and neck pain.  swelling and trauma.;   Skin: .  pruritus, rash, abrasions, bruising and skin lesion.; ulcerations Neuro:    headache, lightheadedness and neck stiffness.  weakness, altered level of consciousness, altered mental status, extremity weakness, burning feet, involuntary movement, seizure and syncope.  Psych:    anxiety, depression, insomnia, tearfulness, panic attacks, hallucinations, paranoia, suicidal or homicidal ideation    Past Medical History  Diagnosis Date  . Hypertension   . High cholesterol   . Arthritis   . Hypothyroidism   . Arrhythmia     pacemaker  . Glaucoma(365)   . Pulmonary embolism 06/04/11  . Coronary artery disease   . Atrial fibrillation   . 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 Anxiety     Past Surgical History  Procedure Laterality Date  . Total hip arthroplasty      right side  . Pacemaker insertion    . Appendectomy    . Abdominal hysterectomy      Medications:  HOME MEDS: Prior to Admission medications   Medication Sig Start Date End Date Taking? Authorizing Provider  acetaminophen (TYLENOL) 325 MG tablet Take 650 mg by mouth every 6 (six) hours as needed.   Yes Historical Provider, MD  albuterol (PROVENTIL) (2.5 MG/3ML) 0.083% nebulizer solution Take 2.5 mg by nebulization every 6 (six) hours as needed. For shortness of breath   Yes Historical Provider, MD  ALPRAZolam (XANAX) 0.25 MG tablet Take 0.25 mg by mouth daily as needed. Anxiety   Yes Historical Provider, MD  amiodarone (PACERONE) 200 MG tablet Take 200 mg by mouth daily.  04/30/11  Yes Historical Provider, MD  bimatoprost (LUMIGAN) 0.01 % SOLN Apply 1 drop to eye  at bedtime.   Yes Historical Provider, MD  brimonidine (ALPHAGAN P) 0.1 % SOLN Place 1 drop into both eyes 2 (two) times daily.   Yes Historical Provider, MD  Calcium Carbonate-Vitamin D (CALCIUM 600 + D PO) Take 1 tablet by mouth 2 (two) times daily.   Yes Historical Provider, MD  docusate sodium (COLACE) 100 MG capsule Take 100 mg by mouth 2 (two)  times daily.   Yes Historical Provider, MD  dorzolamide (TRUSOPT) 2 % ophthalmic solution Place 1 drop into both eyes 2 (two) times daily.   Yes Historical Provider, MD  furosemide (LASIX) 80 MG tablet Take 1 tablet (80 mg total) by mouth 2 (two) times daily. 09/26/11 01/16/13 Yes Erick Blinks, MD  gabapentin (NEURONTIN) 300 MG capsule Take 300 mg by mouth 3 (three) times daily.  11/27/11 01/16/13 Yes Vickki Hearing, MD  ibuprofen (ADVIL,MOTRIN) 400 MG tablet Take 400 mg by mouth every 6 (six) hours as needed for pain.   Yes Historical Provider, MD  levothyroxine (SYNTHROID, LEVOTHROID) 100 MCG tablet Take 100 mcg by mouth daily.   Yes Historical Provider, MD  metoprolol (LOPRESSOR) 50 MG tablet Take 50 mg by mouth every morning.   Yes Historical Provider, MD  ondansetron (ZOFRAN) 4 MG tablet Take 4 mg by mouth every 6 (six) hours as needed. For nausea   Yes Historical Provider, MD  oxyCODONE-acetaminophen (PERCOCET) 5-325 MG per tablet Take 1 tablet by mouth every 4 (four) hours as needed for pain. 06/19/12  Yes Vida Roller, MD  polyethylene glycol powder (GLYCOLAX/MIRALAX) powder Take 17 g by mouth daily.    Yes Historical Provider, MD  potassium chloride SA (K-DUR,KLOR-CON) 20 MEQ tablet Take 2 tablets (40 mEq total) by mouth daily. 09/12/12  Yes Nimish Normajean Glasgow, MD  pravastatin (PRAVACHOL) 20 MG tablet Take 20 mg by mouth daily.   Yes Historical Provider, MD  trimethoprim (TRIMPEX) 100 MG tablet Take 100 mg by mouth at bedtime.   Yes Historical Provider, MD  warfarin (COUMADIN) 4 MG tablet Take 4-6 mg by mouth daily. Take one & one-half tablet on Mondays only. Take one tablet on all other days   Yes Historical Provider, MD  Alum & Mag Hydroxide-Simeth (MAGIC MOUTHWASH) SOLN Take 10 mLs by mouth 4 (four) times daily as needed.    Historical Provider, MD     Allergies:  Allergies  Allergen Reactions  . Naproxen     Patient on coumadin  . Robaxin (Methocarbamol)     Causes patient to be  confused    Social History:   reports that she has never smoked. She has never used smokeless tobacco. She reports that she does not drink alcohol or use illicit drugs.  Family History: Family History  Problem Relation Age of Onset  . Heart attack Mother   . Cancer Father     stomach  . Stroke Sister   . Stroke Brother      Physical Exam: Filed Vitals:   01/16/13 1700 01/16/13 1800 01/16/13 1900 01/16/13 2100  BP: 114/93 127/64 144/68 145/77  Pulse: 73 70 72 72  Temp:    97.7 F (36.5 C)  TempSrc:    Oral  Resp: 20 16 26 22   Height:      Weight:    69.3 kg (152 lb 12.5 oz)  SpO2: 100% 99% 98% 95%   Blood pressure 145/77, pulse 72, temperature 97.7 F (36.5 C), temperature source Oral, resp. rate 22, height 5\' 3"  (1.6 m), weight  69.3 kg (152 lb 12.5 oz), SpO2 95.00%.  GEN:  Pleasant elderly obese Caucasian lady sitting up bed in no acute distress; cooperative with exam PSYCH:  alert and oriented x4;  neither anxious or depressed; affect is appropriate. HEENT: Mucous membranes pink and anicteric; PERRLA; EOM intact; no cervical lymphadenopathy no carotid bruit;  Breasts:: Not examined CHEST WALL: No tenderness CHEST: Prolonged expiration, crackles left base HEART: Regular rate and rhythm; 2/6 systolic murmur no rubs or gallops BACK: Marked kyphosis  no CVA tenderness ABDOMEN: Obese, non-tender; no organomegaly, normal abdominal bowel sounds; no intertriginous candida. Rectal Exam: Not done EXTREMITIES: age-appropriate arthropathy of the hands and knees; no edema; no ulcerations. Genitalia: not examined PULSES: 2+ and symmetric SKIN: Normal hydration no rash or ulceration CNS: Cranial nerves 2-12 grossly intact no focal lateralizing neurologic deficit   Labs on Admission:  Basic Metabolic Panel:  Recent Labs Lab 01/16/13 1745  NA 140  K 4.5  CL 102  CO2 28  GLUCOSE 106*  BUN 36*  CREATININE 2.04*  CALCIUM 10.0   Liver Function Tests: No results found for  this basename: AST, ALT, ALKPHOS, BILITOT, PROT, ALBUMIN,  in the last 168 hours No results found for this basename: LIPASE, AMYLASE,  in the last 168 hours No results found for this basename: AMMONIA,  in the last 168 hours CBC:  Recent Labs Lab 01/16/13 1745  WBC 5.3  NEUTROABS 3.1  HGB 11.1*  HCT 35.6*  MCV 83.2  PLT 236   Cardiac Enzymes:  Recent Labs Lab 01/16/13 1745  TROPONINI <0.30   BNP: No components found with this basename: POCBNP,  D-dimer: No components found with this basename: D-DIMER,  CBG: No results found for this basename: GLUCAP,  in the last 168 hours  Radiological Exams on Admission: Dg Chest 2 View  01/16/2013   *RADIOLOGY REPORT*  Clinical Data: Shortness of breath for 3 days  CHEST - 2 VIEW  Comparison: 09/11/2012  Findings: There is stable cardiomegaly.  Left chest wall dual lead pacer appears stable, leads terminating in the expected locations of the right atrium and right ventricle.  There is heavy atherosclerotic calcification of the thoracic aortic arch.  Lung volumes are low bilaterally.  There is pulmonary vascular congestion.  There is interstitial prominence bilaterally.  No visible pleural effusion or pneumothorax.  The bones are markedly osteopenic.  Vertebroplasty changes are noted at a vertebral body near the thoracolumbar junction.  The exact number of these vertebral bodies is difficult due to the osteopenia.  IMPRESSION:  1.  Cardiomegaly with pulmonary vascular congestion.  Question mild interstitial pulmonary edema versus chronic interstitial prominence. 2.  Dual lead pacemaker. 3.  Marked osteopenia and prior vertebroplasty visualized near the thoracolumbar junction.   Original Report Authenticated By: Britta Mccreedy, M.D.     Assessment/Plan Present on Admission:  . Acute on chronic diastolic heart failure  Symptoms are suggestive of acute heart failure; will admit on heart failure protocol with intravenous Lasix, but will withhold ARB  because of renal failure, and temporarily hold her beta blockers; 2-D echo has not been done since 2012  . PAF (paroxysmal atrial fibrillation)  Continue warfarin anti-coagulation per pharmacy; restart beta blocker as soon as possible  . Acute renal failure  Unlikely to be due to dehydration; likely related to NSAID use;  Possible cardiorenal syndrome; will withhold ACE inhibitors and ARB use but will give intravenous Lasix and monitor renal function. May need cardiology evaluation on Monday COPD; continue nebulizer patient's  when necessary  Multiple chronic medical problem  Continue management of multiple chronic medical problems   Other plans as per orders.  Code Status: FULL CODE to be reconfirmed  Family Communication: Plans discussed with patient's daughter and granddaughter at bedside Disposition Plan: Likely back to a assisted living facility    Sabino Denning Nocturnist [facility] Pager [phone]   01/16/2013, 11:45 PM

## 2013-01-16 NOTE — ED Notes (Signed)
Patient from [facility] w/ c/o SOB x 4 days.  Has recvd. 1 neb this AM w/out improvement. Tachycardic per EMS on arrival.

## 2013-01-17 DIAGNOSIS — J9600 Acute respiratory failure, unspecified whether with hypoxia or hypercapnia: Secondary | ICD-10-CM

## 2013-01-17 LAB — BASIC METABOLIC PANEL
BUN: 31 mg/dL — ABNORMAL HIGH (ref 6–23)
CO2: 29 mEq/L (ref 19–32)
Calcium: 9.2 mg/dL (ref 8.4–10.5)
Creatinine, Ser: 1.68 mg/dL — ABNORMAL HIGH (ref 0.50–1.10)
Glucose, Bld: 95 mg/dL (ref 70–99)

## 2013-01-17 LAB — CBC WITH DIFFERENTIAL/PLATELET
Basophils Absolute: 0 10*3/uL (ref 0.0–0.1)
Basophils Relative: 0 % (ref 0–1)
Eosinophils Absolute: 0.3 10*3/uL (ref 0.0–0.7)
Eosinophils Relative: 5 % (ref 0–5)
Lymphs Abs: 1 10*3/uL (ref 0.7–4.0)
MCH: 25.7 pg — ABNORMAL LOW (ref 26.0–34.0)
MCHC: 31.1 g/dL (ref 30.0–36.0)
MCV: 82.7 fL (ref 78.0–100.0)
Platelets: 196 10*3/uL (ref 150–400)
RDW: 15.7 % — ABNORMAL HIGH (ref 11.5–15.5)

## 2013-01-17 LAB — PROTIME-INR: Prothrombin Time: 30.6 seconds — ABNORMAL HIGH (ref 11.6–15.2)

## 2013-01-17 MED ORDER — WARFARIN - PHARMACIST DOSING INPATIENT
Status: DC
  Administered 2013-01-19: 18:00:00

## 2013-01-17 MED ORDER — GUAIFENESIN ER 600 MG PO TB12
1200.0000 mg | ORAL_TABLET | Freq: Two times a day (BID) | ORAL | Status: DC
  Administered 2013-01-17 – 2013-01-21 (×9): 1200 mg via ORAL
  Filled 2013-01-17 (×9): qty 2

## 2013-01-17 MED ORDER — BIOTENE DRY MOUTH MT LIQD
15.0000 mL | Freq: Two times a day (BID) | OROMUCOSAL | Status: DC
  Administered 2013-01-18 – 2013-01-21 (×7): 15 mL via OROMUCOSAL

## 2013-01-17 MED ORDER — POLYETHYLENE GLYCOL 3350 17 G PO PACK
17.0000 g | PACK | Freq: Every day | ORAL | Status: DC
  Administered 2013-01-17 – 2013-01-21 (×5): 17 g via ORAL
  Filled 2013-01-17 (×5): qty 1

## 2013-01-17 MED ORDER — PREDNISONE 20 MG PO TABS
40.0000 mg | ORAL_TABLET | Freq: Every day | ORAL | Status: DC
  Administered 2013-01-18 – 2013-01-21 (×4): 40 mg via ORAL
  Filled 2013-01-17 (×4): qty 2

## 2013-01-17 MED ORDER — FUROSEMIDE 10 MG/ML IJ SOLN
80.0000 mg | Freq: Two times a day (BID) | INTRAMUSCULAR | Status: DC
  Administered 2013-01-17 – 2013-01-21 (×9): 80 mg via INTRAVENOUS
  Filled 2013-01-17 (×9): qty 8

## 2013-01-17 NOTE — Progress Notes (Signed)
ANTICOAGULATION CONSULT NOTE - Initial Consult  Pharmacy Consult for Warfarin Indication: atrial fibrillation  Allergies  Allergen Reactions  . Naproxen     Patient on coumadin  . Robaxin (Methocarbamol)     Causes patient to be confused    Patient Measurements: Height: 5\' 3"  (160 cm) Weight: 155 lb 8 oz (70.534 kg) IBW/kg (Calculated) : 52.4   Vital Signs: Temp: 97.6 F (36.4 C) (05/17 0500) Temp src: Oral (05/17 0500) BP: 120/71 mmHg (05/17 0500) Pulse Rate: 75 (05/17 0500)  Labs:  Recent Labs  01/16/13 1745 01/17/13 0612 01/17/13 0800  HGB 11.1* 9.8*  --   HCT 35.6* 31.5*  --   PLT 236 196  --   LABPROT 28.9*  --  30.6*  INR 2.91*  --  3.14*  CREATININE 2.04* 1.68*  --   TROPONINI <0.30  --   --     Estimated Creatinine Clearance: 23.5 ml/min (by C-G formula based on Cr of 1.68).   Medical History: Past Medical History  Diagnosis Date  . Hypertension   . High cholesterol   . Arthritis   . Hypothyroidism   . Arrhythmia     pacemaker  . Glaucoma(365)   . Pulmonary embolism 06/04/11  . Coronary artery disease   . Atrial fibrillation   . 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 Anxiety     Medications:  Scheduled:  . amiodarone  100 mg Oral Daily  . [START ON 01/18/2013] antiseptic oral rinse  15 mL Mouth Rinse BID  . bimatoprost  1 drop Both Eyes QHS  . brimonidine  1 drop Both Eyes BID  . docusate sodium  100 mg Oral BID  . dorzolamide  1 drop Both Eyes BID  . furosemide  80 mg Intravenous Q12H  . gabapentin  300 mg Oral TID  . levothyroxine  100 mcg Oral QAC breakfast  . loratadine  10 mg Oral Daily  . polyethylene glycol  17 g Oral Daily  . potassium chloride  40 mEq Oral BID  . simvastatin  10 mg Oral q1800  . sodium chloride  3 mL Intravenous Q12H    Assessment: Continuation of Warfarin from home for AFIB Patient takes Warfarin 4 mg daily, except for 6 mg on Monday INR elevated today 3.14  Goal of Therapy:  INR 2-3 Monitor  platelets by anticoagulation protocol: Yes   Plan:  Hold Warfarin today due to elevated INR INR/PT daily CBC, platelets Labs per protocol  Raquel James, Ahyana Skillin Bennett 01/17/2013,9:35 AM

## 2013-01-17 NOTE — Progress Notes (Signed)
[facility] PROGRESS NOTE  [name] ZOX:[id number] DOB: 07-04-1927 DOA: 01/16/2013 PCP: Cassell Smiles., MD  Assessment/Plan: 1. Acute on chronic diastolic congestive heart failure. Patient has been started on intravenous Lasix. Her ARB has been held. Her volume status is -900 cc. We'll continue current treatments. Her creatinine appears to be improving. 2. COPD. Patient has scattered wheezes and diminished breath sounds. Will continue on nebulizer treatments and start short course of prednisone. 3. Acute renal failure. Possibly related to NSAID use and ARB use. Patient is receiving Lasix and renal function appears to be improving. We'll continue to follow. 4. Paroxysmal atrial fibrillation. Continue amiodarone. She is rate controlled. Continue Coumadin.  Code Status: Full, Family Communication: Discussed with patient and husband at the bedside (indicate person spoken with, relationship, and if by phone, the number) Disposition Plan: Pending hospital course   Consultants:  None  Procedures:  None  Antibiotics:  None (indicate start date, and stop date if known)  HPI/Subjective: Patient still feel short of breath. She has a nonproductive cough. Having difficulty expectorating sputum. Feels better than she did on admission.  Objective: Filed Vitals:   01/17/13 0004 01/17/13 0020 01/17/13 0500 01/17/13 1028  BP:  127/70 120/71 130/62  Pulse: 71 72 75 69  Temp:  98.2 F (36.8 C) 97.6 F (36.4 C) 97.4 F (36.3 C)  TempSrc:  Oral Oral Oral  Resp: 20 20 20 20   Height:      Weight:   70.534 kg (155 lb 8 oz)   SpO2: 97% 99% 98% 99%    Intake/Output Summary (Last 24 hours) at 01/17/13 1336 Last data filed at 01/17/13 1200  Gross per 24 hour  Intake    540 ml  Output   1450 ml  Net   -910 ml   Filed Weights   01/16/13 1610 01/16/13 2100 01/17/13 0500  Weight: 69.4 kg (153 lb) 69.3 kg (152 lb 12.5 oz) 70.534 kg (155 lb 8 oz)    Exam:   General:  No  acute distress  Cardiovascular: S1, S2, regular rate and rhythm  Respiratory: Diminished breath sounds bilaterally with scattered mild wheezes  Abdomen: Soft, nontender, nondistended, bowel sounds are active  Musculoskeletal: No significant pedal edema   Data Reviewed: Basic Metabolic Panel:  Recent Labs Lab 01/16/13 1745 01/17/13 0612  NA 140 141  K 4.5 4.2  CL 102 106  CO2 28 29  GLUCOSE 106* 95  BUN 36* 31*  CREATININE 2.04* 1.68*  CALCIUM 10.0 9.2  MG  --  2.3   Liver Function Tests: No results found for this basename: AST, ALT, ALKPHOS, BILITOT, PROT, ALBUMIN,  in the last 168 hours No results found for this basename: LIPASE, AMYLASE,  in the last 168 hours No results found for this basename: AMMONIA,  in the last 168 hours CBC:  Recent Labs Lab 01/16/13 1745 01/17/13 0612  WBC 5.3 5.2  NEUTROABS 3.1 3.2  HGB 11.1* 9.8*  HCT 35.6* 31.5*  MCV 83.2 82.7  PLT 236 196   Cardiac Enzymes:  Recent Labs Lab 01/16/13 1745  TROPONINI <0.30   BNP (last 3 results)  Recent Labs  03/14/12 2043 09/11/12 0431 01/16/13 1745  PROBNP 1320.0* 7411.0* 2857.0*   CBG: No results found for this basename: GLUCAP,  in the last 168 hours  No results found for this or any previous visit (from the past 240 hour(s)).   Studies: Dg Chest 2 View  01/16/2013   *RADIOLOGY REPORT*  Clinical Data: Shortness  of breath for 3 days  CHEST - 2 VIEW  Comparison: 09/11/2012  Findings: There is stable cardiomegaly.  Left chest wall dual lead pacer appears stable, leads terminating in the expected locations of the right atrium and right ventricle.  There is heavy atherosclerotic calcification of the thoracic aortic arch.  Lung volumes are low bilaterally.  There is pulmonary vascular congestion.  There is interstitial prominence bilaterally.  No visible pleural effusion or pneumothorax.  The bones are markedly osteopenic.  Vertebroplasty changes are noted at a vertebral body near the  thoracolumbar junction.  The exact number of these vertebral bodies is difficult due to the osteopenia.  IMPRESSION:  1.  Cardiomegaly with pulmonary vascular congestion.  Question mild interstitial pulmonary edema versus chronic interstitial prominence. 2.  Dual lead pacemaker. 3.  Marked osteopenia and prior vertebroplasty visualized near the thoracolumbar junction.   Original Report Authenticated By: Britta Mccreedy, M.D.    Scheduled Meds: . amiodarone  100 mg Oral Daily  . [START ON 01/18/2013] antiseptic oral rinse  15 mL Mouth Rinse BID  . bimatoprost  1 drop Both Eyes QHS  . brimonidine  1 drop Both Eyes BID  . docusate sodium  100 mg Oral BID  . dorzolamide  1 drop Both Eyes BID  . furosemide  80 mg Intravenous Q12H  . gabapentin  300 mg Oral TID  . levothyroxine  100 mcg Oral QAC breakfast  . loratadine  10 mg Oral Daily  . polyethylene glycol  17 g Oral Daily  . potassium chloride  40 mEq Oral BID  . simvastatin  10 mg Oral q1800  . sodium chloride  3 mL Intravenous Q12H  . Warfarin - Pharmacist Dosing Inpatient   Does not apply Q24H   Continuous Infusions:   Active Problems:   PAF (paroxysmal atrial fibrillation)   Acute on chronic diastolic heart failure   Acute renal failure    Time spent:    MEMON,JEHANZEB  [facility] Pager [phone]. If 7PM-7AM, please contact night-coverage at [email or web address], password [facility] 01/17/2013, 1:36 PM  LOS: 1 day

## 2013-01-18 LAB — BASIC METABOLIC PANEL
BUN: 29 mg/dL — ABNORMAL HIGH (ref 6–23)
Chloride: 108 mEq/L (ref 96–112)
Creatinine, Ser: 1.6 mg/dL — ABNORMAL HIGH (ref 0.50–1.10)
GFR calc Af Amer: 33 mL/min — ABNORMAL LOW (ref >90)

## 2013-01-18 LAB — CBC
MCH: 25.5 pg — ABNORMAL LOW (ref 26.0–34.0)
MCV: 83.8 fL (ref 78.0–100.0)
Platelets: 227 10*3/uL (ref 150–400)
RBC: 4 MIL/uL (ref 3.87–5.11)

## 2013-01-18 MED ORDER — OXYCODONE HCL 5 MG PO TABS
5.0000 mg | ORAL_TABLET | ORAL | Status: DC | PRN
  Administered 2013-01-18 – 2013-01-20 (×3): 5 mg via ORAL
  Filled 2013-01-18 (×3): qty 1

## 2013-01-18 MED ORDER — WARFARIN SODIUM 2 MG PO TABS
4.0000 mg | ORAL_TABLET | Freq: Once | ORAL | Status: AC
  Administered 2013-01-18: 4 mg via ORAL
  Filled 2013-01-18: qty 2

## 2013-01-18 NOTE — Progress Notes (Signed)
ANTICOAGULATION CONSULT NOTE Pharmacy Consult for Warfarin Indication: atrial fibrillation  Allergies  Allergen Reactions  . Naproxen     Patient on coumadin  . Robaxin (Methocarbamol)     Causes patient to be confused    Patient Measurements: Height: 5\' 3"  (160 cm) Weight: 155 lb 8 oz (70.534 kg) IBW/kg (Calculated) : 52.4   Vital Signs: Temp: 97.6 F (36.4 C) (05/18 0419) Temp src: Oral (05/18 0419) BP: 128/79 mmHg (05/18 0419) Pulse Rate: 67 (05/18 0419)  Labs:  Recent Labs  01/16/13 1745 01/17/13 0612 01/17/13 0800 01/18/13 0437  HGB 11.1* 9.8*  --  10.2*  HCT 35.6* 31.5*  --  33.5*  PLT 236 196  --  227  LABPROT 28.9*  --  30.6* 27.3*  INR 2.91*  --  3.14* 2.69*  CREATININE 2.04* 1.68*  --  1.60*  TROPONINI <0.30  --   --   --     Estimated Creatinine Clearance: 24.6 ml/min (by C-G formula based on Cr of 1.6).   Medical History: Past Medical History  Diagnosis Date  . Hypertension   . High cholesterol   . Arthritis   . Hypothyroidism   . Arrhythmia     pacemaker  . Glaucoma(365)   . Pulmonary embolism 06/04/11  . Coronary artery disease   . Atrial fibrillation   . 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 Anxiety     Medications:  Scheduled:  . amiodarone  100 mg Oral Daily  . antiseptic oral rinse  15 mL Mouth Rinse BID  . bimatoprost  1 drop Both Eyes QHS  . brimonidine  1 drop Both Eyes BID  . docusate sodium  100 mg Oral BID  . dorzolamide  1 drop Both Eyes BID  . furosemide  80 mg Intravenous Q12H  . gabapentin  300 mg Oral TID  . guaiFENesin  1,200 mg Oral BID  . levothyroxine  100 mcg Oral QAC breakfast  . loratadine  10 mg Oral Daily  . polyethylene glycol  17 g Oral Daily  . potassium chloride  40 mEq Oral BID  . predniSONE  40 mg Oral Q breakfast  . simvastatin  10 mg Oral q1800  . sodium chloride  3 mL Intravenous Q12H  . Warfarin - Pharmacist Dosing Inpatient   Does not apply Q24H    Assessment: Continuation of  Warfarin from home for AFIB Patient takes Warfarin 4 mg daily, except for 6 mg on Monday INR elevated on admission. INR at goal today.  Goal of Therapy:  INR 2-3 Monitor platelets by anticoagulation protocol: Yes   Plan:  Warfarin 4 mg po today (PTA dose) INR/PT daily CBC, platelets Labs per protocol  Raquel James, Kennedee Kitzmiller Bennett 01/18/2013,8:45 AM

## 2013-01-18 NOTE — Progress Notes (Signed)
[facility] PROGRESS NOTE  RADIE BERGES ZOX:[id number] DOB: 25-Nov-1926 DOA: 01/16/2013 PCP: Cassell Smiles., MD  Assessment/Plan: 1. Acute on chronic diastolic congestive heart failure. Patient has been started on intravenous Lasix. Her ARB has been held. Her volume status is -3.3L. We'll continue current treatments. Her creatinine appears to be improving. 2. COPD. Breath sounds appear to be improving.  Continue nebulizer treatments and prednisone. Continue to wean down oxygen as tolerated. 3. Acute renal failure. Possibly related to NSAID use and ARB use. Patient is receiving Lasix and renal function appears to be improving. We'll continue to follow. 4. Paroxysmal atrial fibrillation. Continue amiodarone. She is rate controlled. Continue Coumadin.  Code Status: Full code Family Communication: Discussed with patient and husband at the bedside Disposition Plan: Pending hospital course, will ask physical therapy to see   Consultants:  None  Procedures:  None  Antibiotics:  None   HPI/Subjective: Feeling better today. Breathing improving.  Still having difficulty expectorating sputum  Objective: Filed Vitals:   01/17/13 1028 01/17/13 1440 01/17/13 2122 01/18/13 0419  BP: 130/62 132/63 126/73 128/79  Pulse: 69 72 74 67  Temp: 97.4 F (36.3 C) 97.9 F (36.6 C) 98 F (36.7 C) 97.6 F (36.4 C)  TempSrc: Oral Oral Oral Oral  Resp: 20 20 18 18   Height:      Weight:      SpO2: 99% 100% 97% 96%    Intake/Output Summary (Last 24 hours) at 01/18/13 1710 Last data filed at 01/18/13 1358  Gross per 24 hour  Intake    543 ml  Output   2950 ml  Net  -2407 ml   Filed Weights   01/16/13 1610 01/16/13 2100 01/17/13 0500  Weight: 69.4 kg (153 lb) 69.3 kg (152 lb 12.5 oz) 70.534 kg (155 lb 8 oz)    Exam:   General:  No acute distress  Cardiovascular: S1, S2, regular rate and rhythm  Respiratory: Diminished breath sounds with occasional rhonchi, wheezes  improved.  Abdomen: Soft, nontender, nondistended, bowel sounds are active  Musculoskeletal: No significant pedal edema   Data Reviewed: Basic Metabolic Panel:  Recent Labs Lab 01/16/13 1745 01/17/13 0612 01/18/13 0437  NA 140 141 143  K 4.5 4.2 4.2  CL 102 106 108  CO2 28 29 28   GLUCOSE 106* 95 98  BUN 36* 31* 29*  CREATININE 2.04* 1.68* 1.60*  CALCIUM 10.0 9.2 9.5  MG  --  2.3  --    Liver Function Tests: No results found for this basename: AST, ALT, ALKPHOS, BILITOT, PROT, ALBUMIN,  in the last 168 hours No results found for this basename: LIPASE, AMYLASE,  in the last 168 hours No results found for this basename: AMMONIA,  in the last 168 hours CBC:  Recent Labs Lab 01/16/13 1745 01/17/13 0612 01/18/13 0437  WBC 5.3 5.2 4.9  NEUTROABS 3.1 3.2  --   HGB 11.1* 9.8* 10.2*  HCT 35.6* 31.5* 33.5*  MCV 83.2 82.7 83.8  PLT 236 196 227   Cardiac Enzymes:  Recent Labs Lab 01/16/13 1745  TROPONINI <0.30   BNP (last 3 results)  Recent Labs  03/14/12 2043 09/11/12 0431 01/16/13 1745  PROBNP 1320.0* 7411.0* 2857.0*   CBG: No results found for this basename: GLUCAP,  in the last 168 hours  No results found for this or any previous visit (from the past 240 hour(s)).   Studies: No results found.  Scheduled Meds: . amiodarone  100 mg Oral Daily  . antiseptic oral  rinse  15 mL Mouth Rinse BID  . bimatoprost  1 drop Both Eyes QHS  . brimonidine  1 drop Both Eyes BID  . docusate sodium  100 mg Oral BID  . dorzolamide  1 drop Both Eyes BID  . furosemide  80 mg Intravenous Q12H  . gabapentin  300 mg Oral TID  . guaiFENesin  1,200 mg Oral BID  . levothyroxine  100 mcg Oral QAC breakfast  . loratadine  10 mg Oral Daily  . polyethylene glycol  17 g Oral Daily  . potassium chloride  40 mEq Oral BID  . predniSONE  40 mg Oral Q breakfast  . simvastatin  10 mg Oral q1800  . sodium chloride  3 mL Intravenous Q12H  . Warfarin - Pharmacist Dosing Inpatient    Does not apply Q24H   Continuous Infusions:   Active Problems:   PAF (paroxysmal atrial fibrillation)   Acute on chronic diastolic heart failure   Acute renal failure    Time spent:    MEMON,JEHANZEB  [facility] Pager [phone]. If 7PM-7AM, please contact night-coverage at [email or web address], password [facility] 01/18/2013, 5:10 PM  LOS: 2 days

## 2013-01-19 DIAGNOSIS — I059 Rheumatic mitral valve disease, unspecified: Secondary | ICD-10-CM

## 2013-01-19 LAB — BASIC METABOLIC PANEL
BUN: 33 mg/dL — ABNORMAL HIGH (ref 6–23)
Chloride: 107 mEq/L (ref 96–112)
GFR calc Af Amer: 38 mL/min — ABNORMAL LOW (ref >90)
GFR calc non Af Amer: 33 mL/min — ABNORMAL LOW (ref >90)
Potassium: 4.6 mEq/L (ref 3.5–5.1)
Sodium: 141 mEq/L (ref 135–145)

## 2013-01-19 LAB — CBC
MCV: 82.9 fL (ref 78.0–100.0)
Platelets: 238 10*3/uL (ref 150–400)
RBC: 4.04 MIL/uL (ref 3.87–5.11)
RDW: 15.7 % — ABNORMAL HIGH (ref 11.5–15.5)
WBC: 7.4 10*3/uL (ref 4.0–10.5)

## 2013-01-19 LAB — PROTIME-INR: INR: 2 — ABNORMAL HIGH (ref 0.00–1.49)

## 2013-01-19 MED ORDER — WARFARIN SODIUM 6 MG PO TABS
6.0000 mg | ORAL_TABLET | Freq: Once | ORAL | Status: AC
  Administered 2013-01-19: 6 mg via ORAL
  Filled 2013-01-19: qty 1

## 2013-01-19 MED ORDER — MENTHOL 3 MG MT LOZG
1.0000 | LOZENGE | OROMUCOSAL | Status: DC | PRN
  Administered 2013-01-19: 3 mg via ORAL
  Filled 2013-01-19: qty 9

## 2013-01-19 NOTE — Care Management Note (Signed)
    Page 1 of 1   01/21/2013     1:31:31 PM   CARE MANAGEMENT NOTE 01/21/2013  Patient:  BIRDELL, FRASIER   Account Number:  [id number]  Date Initiated:  01/19/2013  Documentation initiated by:  Sharrie Rothman  Subjective/Objective Assessment:   Pt admitted from [facility] with CHF. Pt will return to facility at discharge. Pt uses a wheelchair at [employer].     Action/Plan:   CSW to arrange discharge to facility when medically stable.   Anticipated DC Date:  01/22/2013   Anticipated DC Plan:  ASSISTED LIVING / REST HOME  In-house referral  Clinical Social Worker      DC Planning Services  CM consult      Choice offered to / List presented to:             Status of service:  Completed, signed off Medicare Important Message given?  YES (If response is "NO", the following Medicare IM given date fields will be blank) Date Medicare IM given:  01/21/2013 Date Additional Medicare IM given:    Discharge Disposition:  ASSISTED LIVING  Per UR Regulation:    If discussed at Long Length of Stay Meetings, dates discussed:    Comments:  01/21/13 1330 Arlyss Queen, RN BSN CM Pt discharged back to [facility]. CSW to arrange discharge to facility.  01/19/13 1107 Arlyss Queen, RN BSN CM

## 2013-01-19 NOTE — Progress Notes (Signed)
*  PRELIMINARY RESULTS* Echocardiogram 2D Echocardiogram has been performed.  [name] 01/19/2013, 9:59 AM

## 2013-01-19 NOTE — Progress Notes (Signed)
ANTICOAGULATION CONSULT NOTE Pharmacy Consult for Warfarin Indication: atrial fibrillation  Allergies  Allergen Reactions  . Naproxen     Patient on coumadin  . Robaxin (Methocarbamol)     Causes patient to be confused   Patient Measurements: Height: 5\' 3"  (160 cm) Weight: 154 lb 14.4 oz (70.262 kg) IBW/kg (Calculated) : 52.4  Vital Signs: Temp: 97.5 F (36.4 C) (05/19 0451) Temp src: Oral (05/19 0451) BP: 129/61 mmHg (05/19 0451) Pulse Rate: 74 (05/19 0451)  Labs:  Recent Labs  01/16/13 1745 01/17/13 0612 01/17/13 0800 01/18/13 0437 01/19/13 0509  HGB 11.1* 9.8*  --  10.2* 10.5*  HCT 35.6* 31.5*  --  33.5* 33.5*  PLT 236 196  --  227 238  LABPROT 28.9*  --  30.6* 27.3* 21.9*  INR 2.91*  --  3.14* 2.69* 2.00*  CREATININE 2.04* 1.68*  --  1.60* 1.43*  TROPONINI <0.30  --   --   --   --    Estimated Creatinine Clearance: 27.6 ml/min (by C-G formula based on Cr of 1.43).  Medical History: Past Medical History  Diagnosis Date  . Hypertension   . High cholesterol   . Arthritis   . Hypothyroidism   . Arrhythmia     pacemaker  . Glaucoma(365)   . Pulmonary embolism 06/04/11  . Coronary artery disease   . Atrial fibrillation   . 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 Anxiety    Medications:  Scheduled:  . amiodarone  100 mg Oral Daily  . antiseptic oral rinse  15 mL Mouth Rinse BID  . bimatoprost  1 drop Both Eyes QHS  . brimonidine  1 drop Both Eyes BID  . docusate sodium  100 mg Oral BID  . dorzolamide  1 drop Both Eyes BID  . furosemide  80 mg Intravenous Q12H  . gabapentin  300 mg Oral TID  . guaiFENesin  1,200 mg Oral BID  . levothyroxine  100 mcg Oral QAC breakfast  . loratadine  10 mg Oral Daily  . polyethylene glycol  17 g Oral Daily  . potassium chloride  40 mEq Oral BID  . predniSONE  40 mg Oral Q breakfast  . simvastatin  10 mg Oral q1800  . sodium chloride  3 mL Intravenous Q12H  . warfarin  6 mg Oral Once  . Warfarin - Pharmacist  Dosing Inpatient   Does not apply Q24H   Assessment: Continuation of Warfarin from home for AFIB Patient takes Warfarin 4 mg daily, except for 6 mg on Monday INR therapeutic today at low end of range. Pt is on amiodarone which may interact with Coumadin.  Goal of Therapy:  INR 2-3 Monitor platelets by anticoagulation protocol: Yes   Plan:  Warfarin 6 mg po today (PTA dose) INR/PT daily CBC, platelets Labs per protocol  Valrie Hart A 01/19/2013,8:35 AM

## 2013-01-19 NOTE — Progress Notes (Signed)
UR chart review completed.  

## 2013-01-19 NOTE — Progress Notes (Signed)
[facility] PROGRESS NOTE  [name] WUJ:[id number] DOB: 1927/03/18 DOA: 01/16/2013 PCP: Cassell Smiles., MD  Assessment/Plan: 1. Acute on chronic diastolic congestive heart failure. Patient is currently responding to intravenous Lasix. Her ARB has been held. Her volume status is -3.6L. We'll continue current treatments. Her creatinine appears to be improving. Follow up Echo results. 2. COPD. Breath sounds appear to be improving.  Continue nebulizer treatments and prednisone. Continue to wean down oxygen as tolerated. Will encourage incentive spirometry 3. Acute renal failure. Possibly related to NSAID use and ARB use. Patient is receiving Lasix and renal function appears to be improving. We'll continue to follow. 4. Paroxysmal atrial fibrillation. Continue amiodarone. She is rate controlled. Continue Coumadin.  Code Status: Full code Family Communication: Discussed with patient and husband at the bedside Disposition Plan: anticipate discharge back to [facility] in the next 24-48hours   Consultants:  None  Procedures:  None  Antibiotics:  None   HPI/Subjective: Breathing is slowly improving.  Patient reports some expectoration of mucous, still short of breath.  Objective: Filed Vitals:   01/18/13 2041 01/18/13 2100 01/19/13 0451 01/19/13 0938  BP:  129/52 129/61 104/61  Pulse:  75 74 69  Temp:  97.6 F (36.4 C) 97.5 F (36.4 C) 97.5 F (36.4 C)  TempSrc:  Oral Oral   Resp:  21 21 20   Height:      Weight:   70.262 kg (154 lb 14.4 oz)   SpO2: 98% 96% 99% 98%    Intake/Output Summary (Last 24 hours) at 01/19/13 1343 Last data filed at 01/19/13 0900  Gross per 24 hour  Intake    960 ml  Output   2150 ml  Net  -1190 ml   Filed Weights   01/16/13 2100 01/17/13 0500 01/19/13 0451  Weight: 69.3 kg (152 lb 12.5 oz) 70.534 kg (155 lb 8 oz) 70.262 kg (154 lb 14.4 oz)    Exam:   General:  No acute distress  Cardiovascular: S1, S2, regular rate and  rhythm  Respiratory: Diminished breath sounds with crackles at bases.  Abdomen: Soft, nontender, nondistended, bowel sounds are active  Musculoskeletal: No significant pedal edema   Data Reviewed: Basic Metabolic Panel:  Recent Labs Lab 01/16/13 1745 01/17/13 0612 01/18/13 0437 01/19/13 0509  NA 140 141 143 141  K 4.5 4.2 4.2 4.6  CL 102 106 108 107  CO2 28 29 28 25   GLUCOSE 106* 95 98 110*  BUN 36* 31* 29* 33*  CREATININE 2.04* 1.68* 1.60* 1.43*  CALCIUM 10.0 9.2 9.5 9.7  MG  --  2.3  --   --    Liver Function Tests: No results found for this basename: AST, ALT, ALKPHOS, BILITOT, PROT, ALBUMIN,  in the last 168 hours No results found for this basename: LIPASE, AMYLASE,  in the last 168 hours No results found for this basename: AMMONIA,  in the last 168 hours CBC:  Recent Labs Lab 01/16/13 1745 01/17/13 0612 01/18/13 0437 01/19/13 0509  WBC 5.3 5.2 4.9 7.4  NEUTROABS 3.1 3.2  --   --   HGB 11.1* 9.8* 10.2* 10.5*  HCT 35.6* 31.5* 33.5* 33.5*  MCV 83.2 82.7 83.8 82.9  PLT 236 196 227 238   Cardiac Enzymes:  Recent Labs Lab 01/16/13 1745  TROPONINI <0.30   BNP (last 3 results)  Recent Labs  03/14/12 2043 09/11/12 0431 01/16/13 1745  PROBNP 1320.0* 7411.0* 2857.0*   CBG: No results found for this basename: GLUCAP,  in  the last 168 hours  No results found for this or any previous visit (from the past 240 hour(s)).   Studies: No results found.  Scheduled Meds: . amiodarone  100 mg Oral Daily  . antiseptic oral rinse  15 mL Mouth Rinse BID  . bimatoprost  1 drop Both Eyes QHS  . brimonidine  1 drop Both Eyes BID  . docusate sodium  100 mg Oral BID  . dorzolamide  1 drop Both Eyes BID  . furosemide  80 mg Intravenous Q12H  . gabapentin  300 mg Oral TID  . guaiFENesin  1,200 mg Oral BID  . levothyroxine  100 mcg Oral QAC breakfast  . loratadine  10 mg Oral Daily  . polyethylene glycol  17 g Oral Daily  . potassium chloride  40 mEq Oral BID  .  predniSONE  40 mg Oral Q breakfast  . simvastatin  10 mg Oral q1800  . sodium chloride  3 mL Intravenous Q12H  . warfarin  6 mg Oral Once  . Warfarin - Pharmacist Dosing Inpatient   Does not apply Q24H   Continuous Infusions:   Active Problems:   PAF (paroxysmal atrial fibrillation)   HTN (hypertension)   Hypothyroidism   Acute respiratory failure   Acute on chronic diastolic heart failure   Acute renal failure    Time spent:    [name]  [facility] Pager [phone]. If 7PM-7AM, please contact night-coverage at [email or web address], password [facility] 01/19/2013, 1:43 PM  LOS: 3 days

## 2013-01-19 NOTE — Clinical Social Work Psychosocial (Signed)
Clinical Social Work Department BRIEF PSYCHOSOCIAL ASSESSMENT 01/19/2013  Patient:  DanielleDanielle [name]     Account Number:  [id number]     Admit date:  01/16/2013  Clinical Social Worker:  Nancie Neas  Date/Time:  01/19/2013 11:30 AM  Referred by:  CSW  Date Referred:  01/19/2013 Referred for  ALF Placement   Other Referral:   Interview type:  Patient Other interview type:   and son    PSYCHOSOCIAL DATA Living Status:  FACILITY Admitted from facility:  [facility] Level of care:  Assisted Living Primary support name:  Nedra Hai Primary support relationship to patient:  CHILD, ADULT Degree of support available:   supportive    CURRENT CONCERNS Current Concerns  Post-Acute Placement   Other Concerns:    SOCIAL WORK ASSESSMENT / PLAN CSW met with pt and pt's son, Nedra Hai at bedside. Pt alert and oriented and known to CSW from previous admissions. Pt has been a resident at [facility] for over a year. She reports she has a lot of friends there. Pt requires assist with bathing, but is fairly independent otherwise. She primarily uses a wheelchair, but in outpatient therapy will use a rolling walker. Family very involved and supportive. Per Marchelle Folks at facility, okay for return and they will switch pt to in house home health PT upon return.   Assessment/plan status:  Psychosocial Support/Ongoing Assessment of Needs Other assessment/ plan:   Information/referral to community resources:   [employer]    PATIENT'S/FAMILY'S RESPONSE TO PLAN OF CARE: Pt and pt's son report positive feelings regarding return to [place] and pt is very happy there. CSW will continue to follow and update facility as needed.       [name], [phone]

## 2013-01-20 LAB — BASIC METABOLIC PANEL
CO2: 27 mEq/L (ref 19–32)
Chloride: 106 mEq/L (ref 96–112)
Creatinine, Ser: 1.38 mg/dL — ABNORMAL HIGH (ref 0.50–1.10)
GFR calc Af Amer: 39 mL/min — ABNORMAL LOW (ref >90)
Potassium: 4.9 mEq/L (ref 3.5–5.1)
Sodium: 141 mEq/L (ref 135–145)

## 2013-01-20 LAB — CBC
MCV: 83.1 fL (ref 78.0–100.0)
Platelets: 259 10*3/uL (ref 150–400)
RBC: 4.15 MIL/uL (ref 3.87–5.11)
RDW: 15.8 % — ABNORMAL HIGH (ref 11.5–15.5)
WBC: 8.8 10*3/uL (ref 4.0–10.5)

## 2013-01-20 LAB — PROTIME-INR
INR: 2.14 — ABNORMAL HIGH (ref 0.00–1.49)
Prothrombin Time: 23 seconds — ABNORMAL HIGH (ref 11.6–15.2)

## 2013-01-20 MED ORDER — WARFARIN SODIUM 2 MG PO TABS
4.0000 mg | ORAL_TABLET | Freq: Once | ORAL | Status: AC
  Administered 2013-01-20: 4 mg via ORAL
  Filled 2013-01-20: qty 2

## 2013-01-20 NOTE — Clinical Documentation Improvement (Signed)
GENERIC DOCUMENTATION CLARIFICATION QUERY  THIS DOCUMENT IS NOT A PERMANENT PART OF THE MEDICAL RECORD  TO RESPOND TO THE THIS QUERY, FOLLOW THE INSTRUCTIONS BELOW:  1. If needed, update documentation for the patient's encounter via the notes activity.  2. Access this query again and click edit on the In [employer].  3. After updating, or not, click F2 to complete all highlighted (required) fields concerning your review. Select "additional documentation in the medical record" OR "no additional documentation provided".  4. Click Sign note button.  5. The deficiency will fall out of your In Basket *Please let us know if you are not able to complete this workflow by phone or e-mail (listed below).  Please update your documentation within the medical record to reflect your response to this query.                                                                                        01/20/13   Dear Dr. Kerry Hough / Associates,  In a better effort to capture your patient's severity of illness, reflect appropriate length of stay and utilization of resources, a review of the patient medical record has revealed the following indicators.   Based on your clinical judgment, please clarify and document in a progress note and/or discharge summary the clinical condition associated with the following supporting information: In responding to this query please exercise your independent judgment.  The fact that a query is asked, does not imply that any particular answer is desired or expected.   Please clarify status of COPD. Thank you.  Possible Clinical Conditions  COPD at baseline  COPD acute exacerbation Other Condition__________________ Cannot Clinically Determine   Supporting Information:  Risk Factors: 5/19 progress note:  COPD. Breath sounds appear to be improving. Continue nebulizer treatments and prednisone. Continue to wean down oxygen as tolerated. Will encourage incentive spirometry 5/18  progress note: COPD. Breath sounds appear to be improving. Continue nebulizer treatments and prednisone. Continue to wean down oxygen as tolerated COPD. Patient has scattered wheezes and diminished breath sounds. Will continue on nebulizer treatments and start short course of prednisone 5/17 progress note: COPD. Patient has scattered wheezes and diminished breath sounds. Will continue on nebulizer treatments and start short course of prednisone H&P: COPD; continue nebulizer patient's when necessary  Signs & Symptoms: Shortness of breath Cough wheezing stridor Diagnostics:  Treatment Nebulizer treatments  -Proventil 2.5 q6h prn  -Atrovent 0.5mg  q6h prn Prednisone 40mg  po daily O2 2L/m via  Incentive Spirometry q1h while awake  You may use possible, probable, or suspect with inpatient documentation. possible, probable, suspected diagnoses MUST be documented at the time of discharge  Reviewed: Answer found in DCS-COPD exacerbation by Dr. Kerry Hough Thank You,  Harless Litten RN, MSN Clinical Documentation Specialist: Office# [phone] [facility] Information Management 

## 2013-01-20 NOTE — Progress Notes (Signed)
[facility] PROGRESS NOTE  [name] ZOX:[id number] DOB: 09-May-1927 DOA: 01/16/2013 PCP: Cassell Smiles., MD  Assessment/Plan: 1. Acute on chronic diastolic congestive heart failure. Patient is currently responding to intravenous Lasix. Her ARB has been held. Her volume status is -5.6L. We'll continue current treatments. Her creatinine appears to be improving. Echo shows normal EF. 2. COPD. Breath sounds appear to be improving.  Continue nebulizer treatments and taper prednisone. Discontinue oxygen today. Will encourage incentive spirometry 3. Acute renal failure. Possibly related to NSAID use and ARB use. Patient is receiving Lasix and renal function appears to be improving. We'll continue to follow. 4. Paroxysmal atrial fibrillation. Continue amiodarone. She is rate controlled. Continue Coumadin.  Code Status: Full code Family Communication: Discussed with patient and husband at the bedside Disposition Plan: anticipate discharge back to [facility] probably tomorrow.   Consultants:  None  Procedures:  None  Antibiotics:  None   HPI/Subjective: She feels that her breathing continues to improve and is getting close to her baseline. She is coughing.  Objective: Filed Vitals:   01/19/13 2100 01/19/13 2343 01/20/13 0210 01/20/13 0500  BP: 151/68  144/68 145/67  Pulse: 72  71 69  Temp: 97.8 F (36.6 C)   97.4 F (36.3 C)  TempSrc: Oral   Oral  Resp: 18   16  Height:      Weight:    69.174 kg (152 lb 8 oz)  SpO2: 100% 100% 100% 100%    Intake/Output Summary (Last 24 hours) at 01/20/13 0954 Last data filed at 01/20/13 0500  Gross per 24 hour  Intake    870 ml  Output   2800 ml  Net  -1930 ml   Filed Weights   01/17/13 0500 01/19/13 0451 01/20/13 0500  Weight: 70.534 kg (155 lb 8 oz) 70.262 kg (154 lb 14.4 oz) 69.174 kg (152 lb 8 oz)    Exam:   General:  No acute distress  Cardiovascular: S1, S2, regular rate and rhythm  Respiratory:  Diminished breath sounds with minimal crackles at bases.  Abdomen: Soft, nontender, nondistended, bowel sounds are active  Musculoskeletal: No significant pedal edema   Data Reviewed: Basic Metabolic Panel:  Recent Labs Lab 01/16/13 1745 01/17/13 0612 01/18/13 0437 01/19/13 0509 01/20/13 0511  NA 140 141 143 141 141  K 4.5 4.2 4.2 4.6 4.9  CL 102 106 108 107 106  CO2 28 29 28 25 27   GLUCOSE 106* 95 98 110* 112*  BUN 36* 31* 29* 33* 39*  CREATININE 2.04* 1.68* 1.60* 1.43* 1.38*  CALCIUM 10.0 9.2 9.5 9.7 10.0  MG  --  2.3  --   --   --    Liver Function Tests: No results found for this basename: AST, ALT, ALKPHOS, BILITOT, PROT, ALBUMIN,  in the last 168 hours No results found for this basename: LIPASE, AMYLASE,  in the last 168 hours No results found for this basename: AMMONIA,  in the last 168 hours CBC:  Recent Labs Lab 01/16/13 1745 01/17/13 0612 01/18/13 0437 01/19/13 0509 01/20/13 0511  WBC 5.3 5.2 4.9 7.4 8.8  NEUTROABS 3.1 3.2  --   --   --   HGB 11.1* 9.8* 10.2* 10.5* 10.8*  HCT 35.6* 31.5* 33.5* 33.5* 34.5*  MCV 83.2 82.7 83.8 82.9 83.1  PLT 236 196 227 238 259   Cardiac Enzymes:  Recent Labs Lab 01/16/13 1745  TROPONINI <0.30   BNP (last 3 results)  Recent Labs  03/14/12 2043 09/11/12 0431  01/16/13 1745  PROBNP 1320.0* 7411.0* 2857.0*   CBG: No results found for this basename: GLUCAP,  in the last 168 hours  No results found for this or any previous visit (from the past 240 hour(s)).   Studies: No results found.  Scheduled Meds: . amiodarone  100 mg Oral Daily  . antiseptic oral rinse  15 mL Mouth Rinse BID  . bimatoprost  1 drop Both Eyes QHS  . brimonidine  1 drop Both Eyes BID  . docusate sodium  100 mg Oral BID  . dorzolamide  1 drop Both Eyes BID  . furosemide  80 mg Intravenous Q12H  . gabapentin  300 mg Oral TID  . guaiFENesin  1,200 mg Oral BID  . levothyroxine  100 mcg Oral QAC breakfast  . loratadine  10 mg Oral Daily   . polyethylene glycol  17 g Oral Daily  . potassium chloride  40 mEq Oral BID  . predniSONE  40 mg Oral Q breakfast  . simvastatin  10 mg Oral q1800  . sodium chloride  3 mL Intravenous Q12H  . warfarin  4 mg Oral Once  . Warfarin - Pharmacist Dosing Inpatient   Does not apply Q24H   Continuous Infusions:   Active Problems:   PAF (paroxysmal atrial fibrillation)   HTN (hypertension)   Hypothyroidism   Acute respiratory failure   Acute on chronic diastolic heart failure   Acute renal failure    Time spent:    MEMON,JEHANZEB  [facility] Pager [phone]. If 7PM-7AM, please contact night-coverage at [email or web address], password [facility] 01/20/2013, 9:54 AM  LOS: 4 days

## 2013-01-20 NOTE — Progress Notes (Signed)
ANTICOAGULATION CONSULT NOTE  Pharmacy Consult for Warfarin Indication: atrial fibrillation  Allergies  Allergen Reactions  . Naproxen     Patient on coumadin  . Robaxin (Methocarbamol)     Causes patient to be confused   Patient Measurements: Height: 5\' 3"  (160 cm) Weight: 152 lb 8 oz (69.174 kg) IBW/kg (Calculated) : 52.4  Vital Signs: Temp: 97.4 F (36.3 C) (05/20 0500) Temp src: Oral (05/20 0500) BP: 145/67 mmHg (05/20 0500) Pulse Rate: 69 (05/20 0500)  Labs:  Recent Labs  01/18/13 0437 01/19/13 0509 01/20/13 0511  HGB 10.2* 10.5* 10.8*  HCT 33.5* 33.5* 34.5*  PLT 227 238 259  LABPROT 27.3* 21.9* 23.0*  INR 2.69* 2.00* 2.14*  CREATININE 1.60* 1.43* 1.38*   Estimated Creatinine Clearance: 28.3 ml/min (by C-G formula based on Cr of 1.38).  Medical History: Past Medical History  Diagnosis Date  . Hypertension   . High cholesterol   . Arthritis   . Hypothyroidism   . Arrhythmia     pacemaker  . Glaucoma(365)   . Pulmonary embolism 06/04/11  . Coronary artery disease   . Atrial fibrillation   . 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 Anxiety    Medications:  Scheduled:  . amiodarone  100 mg Oral Daily  . antiseptic oral rinse  15 mL Mouth Rinse BID  . bimatoprost  1 drop Both Eyes QHS  . brimonidine  1 drop Both Eyes BID  . docusate sodium  100 mg Oral BID  . dorzolamide  1 drop Both Eyes BID  . furosemide  80 mg Intravenous Q12H  . gabapentin  300 mg Oral TID  . guaiFENesin  1,200 mg Oral BID  . levothyroxine  100 mcg Oral QAC breakfast  . loratadine  10 mg Oral Daily  . polyethylene glycol  17 g Oral Daily  . potassium chloride  40 mEq Oral BID  . predniSONE  40 mg Oral Q breakfast  . simvastatin  10 mg Oral q1800  . sodium chloride  3 mL Intravenous Q12H  . warfarin  4 mg Oral Once  . Warfarin - Pharmacist Dosing Inpatient   Does not apply Q24H   Assessment: Continuation of Warfarin from home for AFIB Patient takes Warfarin 4 mg  daily, except for 6 mg on Monday INR therapeutic today at low end of range.  H/H is stable. Pt is on amiodarone which may interact with Coumadin.  Goal of Therapy:  INR 2-3 Monitor platelets by anticoagulation protocol: Yes   Plan:  Warfarin 4 mg po today (PTA dose) INR/PT daily CBC, platelets Labs per protocol  Valrie Hart A 01/20/2013,7:50 AM

## 2013-01-21 LAB — BASIC METABOLIC PANEL
CO2: 25 mEq/L (ref 19–32)
Calcium: 10.4 mg/dL (ref 8.4–10.5)
GFR calc non Af Amer: 33 mL/min — ABNORMAL LOW (ref >90)
Potassium: 5 mEq/L (ref 3.5–5.1)
Sodium: 138 mEq/L (ref 135–145)

## 2013-01-21 LAB — CBC
Hemoglobin: 11.4 g/dL — ABNORMAL LOW (ref 12.0–15.0)
MCH: 25.6 pg — ABNORMAL LOW (ref 26.0–34.0)
Platelets: 266 10*3/uL (ref 150–400)
RBC: 4.46 MIL/uL (ref 3.87–5.11)

## 2013-01-21 LAB — PROTIME-INR
INR: 2.5 — ABNORMAL HIGH (ref 0.00–1.49)
Prothrombin Time: 25.8 seconds — ABNORMAL HIGH (ref 11.6–15.2)

## 2013-01-21 MED ORDER — PREDNISONE 10 MG PO TABS: ORAL_TABLET | ORAL | Status: DC

## 2013-01-21 MED ORDER — WARFARIN SODIUM 2 MG PO TABS: 4.0000 mg | ORAL_TABLET | Freq: Once | ORAL | Status: DC

## 2013-01-21 MED ORDER — GUAIFENESIN ER 600 MG PO TB12: 600.0000 mg | ORAL_TABLET | Freq: Two times a day (BID) | ORAL | Status: DC

## 2013-01-21 NOTE — Progress Notes (Signed)
ANTICOAGULATION CONSULT NOTE  Pharmacy Consult for Warfarin Indication: atrial fibrillation  Allergies  Allergen Reactions  . Naproxen     Patient on coumadin  . Robaxin (Methocarbamol)     Causes patient to be confused   Patient Measurements: Height: 5\' 3"  (160 cm) Weight: 151 lb 3.2 oz (68.584 kg) IBW/kg (Calculated) : 52.4  Vital Signs: Temp: 97.4 F (36.3 C) (05/21 0515) Temp src: Oral (05/21 0515) BP: 144/85 mmHg (05/21 0515) Pulse Rate: 80 (05/21 0515)  Labs:  Recent Labs  01/19/13 0509 01/20/13 0511 01/21/13 0519  HGB 10.5* 10.8* 11.4*  HCT 33.5* 34.5* 37.2  PLT 238 259 266  LABPROT 21.9* 23.0* 25.8*  INR 2.00* 2.14* 2.50*  CREATININE 1.43* 1.38* 1.41*   Estimated Creatinine Clearance: 27.6 ml/min (by C-G formula based on Cr of 1.41).  Medical History: Past Medical History  Diagnosis Date  . Hypertension   . High cholesterol   . Arthritis   . Hypothyroidism   . Arrhythmia     pacemaker  . Glaucoma(365)   . Pulmonary embolism 06/04/11  . Coronary artery disease   . Atrial fibrillation   . 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 Anxiety    Medications:  Scheduled:  . amiodarone  100 mg Oral Daily  . antiseptic oral rinse  15 mL Mouth Rinse BID  . bimatoprost  1 drop Both Eyes QHS  . brimonidine  1 drop Both Eyes BID  . docusate sodium  100 mg Oral BID  . dorzolamide  1 drop Both Eyes BID  . furosemide  80 mg Intravenous Q12H  . gabapentin  300 mg Oral TID  . guaiFENesin  1,200 mg Oral BID  . levothyroxine  100 mcg Oral QAC breakfast  . loratadine  10 mg Oral Daily  . polyethylene glycol  17 g Oral Daily  . potassium chloride  40 mEq Oral BID  . predniSONE  40 mg Oral Q breakfast  . simvastatin  10 mg Oral q1800  . sodium chloride  3 mL Intravenous Q12H  . warfarin  4 mg Oral ONCE-1800  . Warfarin - Pharmacist Dosing Inpatient   Does not apply Q24H   Assessment: Patient on chronic warfarin for AFIB. Patient takes Warfarin 4 mg  daily, except for 6 mg on Monday INR remains therapeutic.  H/H is stable.  Goal of Therapy:  INR 2-3   Plan:  Continue Warfarin 4 mg po today per patient's home regimen.  Anticipate d/c home on this regimen today.  INR/PT daily  Elson Clan 01/21/2013,9:20 AM

## 2013-01-21 NOTE — Clinical Social Work Note (Signed)
Pt d/c today back to [facility]. Pt, pt's husband, and facility aware and agreeable. Pt to transfer with family. D/C summary and FL2 faxed.  Derenda Fennel, [place] [phone]

## 2013-01-21 NOTE — Progress Notes (Signed)
Pt is to be discharged today to [facility]. Pt is in NAD, IV is out, all paperwork has been reviewed/discussed with patient, and there are no questions/concerns at this time. Assessment is unchanged from this morning. Pt is to be accompanied downstairs by staff and family via wheelchair.

## 2013-01-21 NOTE — Discharge Summary (Signed)
Physician Discharge Summary  ALZORA HA [name]:[id number] DOB: [birth year] DOA: 01/16/2013  PCP: [name]., MD  Admit date: 01/16/2013 Discharge date: 01/21/2013  Time spent: 40 minutes  Recommendations for Outpatient Follow-up:  1. Patient will be discharged back to [facility]  Discharge Diagnoses:  Active Problems:   PAF (paroxysmal atrial fibrillation)   HTN (hypertension)   Hypothyroidism   Acute respiratory failure   Acute on chronic diastolic heart failure   Acute renal failure   Discharge Condition: improved  Diet recommendation: lo wsalt  Filed Weights   01/19/13 0451 01/20/13 0500 01/21/13 0515  Weight: 70.262 kg (154 lb 14.4 oz) 69.174 kg (152 lb 8 oz) 68.584 kg (151 lb 3.2 oz)    History of present illness:  [name] is an 79 y.o. female. Elderly Caucasian lady resident of an assisted living facility, history of CHF, mobile only by wheelchair but usually able to transfer to the commode, presents with progressive shortness of breath for one week. Shortness of breath is severely aggravated by exertion of transfer to commode, associated with orthopnea, and this interferes with sleep, requiring frequent adjustments to position. She has been treated with nebulization, with minimal improvement.  She reports she is weighed daily at the assisted living, and that her weight fluctuates up and down. She feels she has been losing weight. She does admit to dietary indiscretion on Mother's Day.  The patient has been taking ibuprofen for the few months for osteoarthritis  She is chronically anticoagulated for paroxysmal atrial fibrillation, and wears a pacemaker for tachybradycardia syndrome   Hospital Course:  This lady was admitted to the hospital shortness of breath. She was found to be an acute on chronic diastolic congestive heart failure. She was started on IV Lasix. Her hospital course she has significantly improved. Her fluid status is -6.7 L. Her  respiratory status has significantly improved. She does not have any significant lower extremity edema and no crackles on lung exam. 2-D echocardiogram was done which demonstrated a normal ejection fraction. The patient will be transitioned back to oral Lasix and to followup with her primary care physician for further management.  She also had an element of wheezing to her shortness of breath. She was continued on nebulizer treatments start a course of steroids. Her wheezing has since resolved she had been placed on a prednisone taper. Is likely secondary to a COPD exacerbation.  Patient was also acute renal failure. This is felt to be prerenal to low cardiac output. With diuresis, her creatinine has since improved. Current level is 1.4. This can be further followed as an outpatient.  Patient has poor functional capacity at baseline. She is undergoing physical therapy at her assisted-living facility. It seems appropriate to continue this at discharge. Patient is no longer requiring oxygen. She is felt stable for discharge  Procedures:  Echo:- Left ventricle: The cavity size was normal. Wall thickness was increased in a pattern of mild LVH. Systolic function was normal. The estimated ejection fraction was in the range of 60% to 65%. Wall motion was normal; there were no regional wall motion abnormalities. - Ventricular septum: Septal motion showed paradox. These changes are consistent with right ventricular pacing. - Mitral valve: Mild regurgitation. - Left atrium: The atrium was mildly dilated.  Consultations:  none  Discharge Exam: Filed Vitals:   01/20/13 2224 01/20/13 2228 01/21/13 0202 01/21/13 0515  BP:   115/69 144/85  Pulse: 83  70 80  Temp:   98 F (36.7 C)  97.4 F (36.3 C)  TempSrc:   Oral Oral  Resp: 16  18 18   Height:      Weight:    68.584 kg (151 lb 3.2 oz)  SpO2: 98% 93% 96% 98%    General: NAD Cardiovascular: S1, S2, RRR Respiratory: CTA B  Discharge  Instructions  Discharge Orders   Future Orders Complete By Expires     (HEART FAILURE PATIENTS) Call MD:  Anytime you have any of the following symptoms: 1) 3 pound weight gain in 24 hours or 5 pounds in 1 week 2) shortness of breath, with or without a dry hacking cough 3) swelling in the hands, feet or stomach 4) if you have to sleep on extra pillows at night in order to breathe.  As directed     Diet - low sodium heart healthy  As directed     Increase activity slowly  As directed         Medication List    STOP taking these medications       ibuprofen 400 MG tablet  Commonly known as:  ADVIL,MOTRIN      TAKE these medications       acetaminophen 325 MG tablet  Commonly known as:  TYLENOL  Take 650 mg by mouth every 6 (six) hours as needed.     albuterol (2.5 MG/3ML) 0.083% nebulizer solution  Commonly known as:  PROVENTIL  Take 2.5 mg by nebulization every 6 (six) hours as needed. For shortness of breath     ALPRAZolam 0.25 MG tablet  Commonly known as:  XANAX  Take 0.25 mg by mouth daily as needed. Anxiety     amiodarone 200 MG tablet  Commonly known as:  PACERONE  Take 200 mg by mouth daily.     brimonidine 0.1 % Soln  Commonly known as:  ALPHAGAN P  Place 1 drop into both eyes 2 (two) times daily.     CALCIUM 600 + D PO  Take 1 tablet by mouth 2 (two) times daily.     docusate sodium 100 MG capsule  Commonly known as:  COLACE  Take 100 mg by mouth 2 (two) times daily.     dorzolamide 2 % ophthalmic solution  Commonly known as:  TRUSOPT  Place 1 drop into both eyes 2 (two) times daily.     furosemide 80 MG tablet  Commonly known as:  LASIX  Take 1 tablet (80 mg total) by mouth 2 (two) times daily.     gabapentin 300 MG capsule  Commonly known as:  NEURONTIN  Take 300 mg by mouth 3 (three) times daily.     guaiFENesin 600 MG 12 hr tablet  Commonly known as:  MUCINEX  Take 1 tablet (600 mg total) by mouth 2 (two) times daily.     levothyroxine 100 MCG  tablet  Commonly known as:  SYNTHROID, LEVOTHROID  Take 100 mcg by mouth daily.     LUMIGAN 0.01 % Soln  Generic drug:  bimatoprost  Apply 1 drop to eye at bedtime.     magic mouthwash Soln  Take 10 mLs by mouth 4 (four) times daily as needed.     metoprolol 50 MG tablet  Commonly known as:  LOPRESSOR  Take 50 mg by mouth every morning.     ondansetron 4 MG tablet  Commonly known as:  ZOFRAN  Take 4 mg by mouth every 6 (six) hours as needed. For nausea     oxyCODONE-acetaminophen 5-325 MG per  tablet  Commonly known as:  PERCOCET  Take 1 tablet by mouth every 4 (four) hours as needed for pain.     polyethylene glycol powder powder  Commonly known as:  GLYCOLAX/MIRALAX  Take 17 g by mouth daily.     potassium chloride SA 20 MEQ tablet  Commonly known as:  K-DUR,KLOR-CON  Take 2 tablets (40 mEq total) by mouth daily.     pravastatin 20 MG tablet  Commonly known as:  PRAVACHOL  Take 20 mg by mouth daily.     predniSONE 10 MG tablet  Commonly known as:  DELTASONE  Take 40mg  po daily for 2 days, then 30mg  po daily for 2 days, then 20mg  po daily for 2 days then 10mg  po daily for 2 days then stop     trimethoprim 100 MG tablet  Commonly known as:  TRIMPEX  Take 100 mg by mouth at bedtime.     warfarin 4 MG tablet  Commonly known as:  COUMADIN  Take 4-6 mg by mouth daily. Take one & one-half tablet on Mondays only. Take one tablet on all other days       Allergies  Allergen Reactions  . Naproxen     Patient on coumadin  . Robaxin (Methocarbamol)     Causes patient to be confused      The results of significant diagnostics from this hospitalization (including imaging, microbiology, ancillary and laboratory) are listed below for reference.    Significant Diagnostic Studies: Dg Chest 2 View  01/16/2013   *RADIOLOGY REPORT*  Clinical Data: Shortness of breath for 3 days  CHEST - 2 VIEW  Comparison: 09/11/2012  Findings: There is stable cardiomegaly.  Left chest wall  dual lead pacer appears stable, leads terminating in the expected locations of the right atrium and right ventricle.  There is heavy atherosclerotic calcification of the thoracic aortic arch.  Lung volumes are low bilaterally.  There is pulmonary vascular congestion.  There is interstitial prominence bilaterally.  No visible pleural effusion or pneumothorax.  The bones are markedly osteopenic.  Vertebroplasty changes are noted at a vertebral body near the thoracolumbar junction.  The exact number of these vertebral bodies is difficult due to the osteopenia.  IMPRESSION:  1.  Cardiomegaly with pulmonary vascular congestion.  Question mild interstitial pulmonary edema versus chronic interstitial prominence. 2.  Dual lead pacemaker. 3.  Marked osteopenia and prior vertebroplasty visualized near the thoracolumbar junction.   Original Report Authenticated By: Britta Mccreedy, M.D.    Microbiology: No results found for this or any previous visit (from the past 240 hour(s)).   Labs: Basic Metabolic Panel:  Recent Labs Lab 01/17/13 0612 01/18/13 0437 01/19/13 0509 01/20/13 0511 01/21/13 0519  NA 141 143 141 141 138  K 4.2 4.2 4.6 4.9 5.0  CL 106 108 107 106 103  CO2 29 28 25 27 25   GLUCOSE 95 98 110* 112* 90  BUN 31* 29* 33* 39* 46*  CREATININE 1.68* 1.60* 1.43* 1.38* 1.41*  CALCIUM 9.2 9.5 9.7 10.0 10.4  MG 2.3  --   --   --   --    Liver Function Tests: No results found for this basename: AST, ALT, ALKPHOS, BILITOT, PROT, ALBUMIN,  in the last 168 hours No results found for this basename: LIPASE, AMYLASE,  in the last 168 hours No results found for this basename: AMMONIA,  in the last 168 hours CBC:  Recent Labs Lab 01/16/13 1745 01/17/13 0612 01/18/13 0437 01/19/13 0509 01/20/13 4540  01/21/13 0519  WBC 5.3 5.2 4.9 7.4 8.8 11.0*  NEUTROABS 3.1 3.2  --   --   --   --   HGB 11.1* 9.8* 10.2* 10.5* 10.8* 11.4*  HCT 35.6* 31.5* 33.5* 33.5* 34.5* 37.2  MCV 83.2 82.7 83.8 82.9 83.1 83.4   PLT 236 196 227 238 259 266   Cardiac Enzymes:  Recent Labs Lab 01/16/13 1745  TROPONINI <0.30   BNP: BNP (last 3 results)  Recent Labs  03/14/12 2043 09/11/12 0431 01/16/13 1745  PROBNP 1320.0* 7411.0* 2857.0*   CBG: No results found for this basename: GLUCAP,  in the last 168 hours     Signed:  MEMON,JEHANZEB  [facility] 01/21/2013, 12:18 PM

## 2013-01-27 LAB — PROTIME-INR: INR: 3.2 — AB (ref <=1.1)

## 2013-01-29 ENCOUNTER — Ambulatory Visit (INDEPENDENT_AMBULATORY_CARE_PROVIDER_SITE_OTHER): Payer: Self-pay | Admitting: Pharmacist Clinician (PhC)/ Clinical Pharmacy Specialist

## 2013-01-29 DIAGNOSIS — Z7901 Long term (current) use of anticoagulants: Secondary | ICD-10-CM

## 2013-01-29 DIAGNOSIS — I480 Paroxysmal atrial fibrillation: Secondary | ICD-10-CM

## 2013-01-29 DIAGNOSIS — I4891 Unspecified atrial fibrillation: Secondary | ICD-10-CM

## 2013-02-09 LAB — PROTIME-INR: INR: 3.2 — AB (ref <=1.1)

## 2013-02-10 ENCOUNTER — Ambulatory Visit (INDEPENDENT_AMBULATORY_CARE_PROVIDER_SITE_OTHER): Payer: Self-pay | Admitting: Pharmacist Clinician (PhC)/ Clinical Pharmacy Specialist

## 2013-02-10 DIAGNOSIS — I480 Paroxysmal atrial fibrillation: Secondary | ICD-10-CM

## 2013-02-10 DIAGNOSIS — I4891 Unspecified atrial fibrillation: Secondary | ICD-10-CM

## 2013-02-10 DIAGNOSIS — Z7901 Long term (current) use of anticoagulants: Secondary | ICD-10-CM

## 2013-02-23 ENCOUNTER — Emergency Department (HOSPITAL_COMMUNITY): Payer: Medicare Other

## 2013-02-23 ENCOUNTER — Inpatient Hospital Stay (HOSPITAL_COMMUNITY)
Admission: EM | Admit: 2013-02-23 | Discharge: 2013-02-25 | DRG: 292 | Disposition: A | Discharge status: Home Health | Payer: Medicare Other | Attending: Internal Medicine | Admitting: Internal Medicine

## 2013-02-23 ENCOUNTER — Encounter (HOSPITAL_COMMUNITY): Payer: Self-pay

## 2013-02-23 ENCOUNTER — Ambulatory Visit (INDEPENDENT_AMBULATORY_CARE_PROVIDER_SITE_OTHER): Payer: Self-pay | Admitting: Pharmacist Clinician (PhC)/ Clinical Pharmacy Specialist

## 2013-02-23 DIAGNOSIS — E039 Hypothyroidism, unspecified: Secondary | ICD-10-CM

## 2013-02-23 DIAGNOSIS — Z950 Presence of cardiac pacemaker: Secondary | ICD-10-CM

## 2013-02-23 DIAGNOSIS — I129 Hypertensive chronic kidney disease with stage 1 through stage 4 chronic kidney disease, or unspecified chronic kidney disease: Secondary | ICD-10-CM | POA: Diagnosis present

## 2013-02-23 DIAGNOSIS — M810 Age-related osteoporosis without current pathological fracture: Secondary | ICD-10-CM | POA: Diagnosis present

## 2013-02-23 DIAGNOSIS — I10 Essential (primary) hypertension: Secondary | ICD-10-CM

## 2013-02-23 DIAGNOSIS — I495 Sick sinus syndrome: Secondary | ICD-10-CM

## 2013-02-23 DIAGNOSIS — E7800 Pure hypercholesterolemia, unspecified: Secondary | ICD-10-CM | POA: Diagnosis present

## 2013-02-23 DIAGNOSIS — M129 Arthropathy, unspecified: Secondary | ICD-10-CM | POA: Diagnosis present

## 2013-02-23 DIAGNOSIS — G894 Chronic pain syndrome: Secondary | ICD-10-CM

## 2013-02-23 DIAGNOSIS — H409 Unspecified glaucoma: Secondary | ICD-10-CM | POA: Diagnosis present

## 2013-02-23 DIAGNOSIS — I2510 Atherosclerotic heart disease of native coronary artery without angina pectoris: Secondary | ICD-10-CM | POA: Diagnosis present

## 2013-02-23 DIAGNOSIS — J9600 Acute respiratory failure, unspecified whether with hypoxia or hypercapnia: Secondary | ICD-10-CM

## 2013-02-23 DIAGNOSIS — N189 Chronic kidney disease, unspecified: Secondary | ICD-10-CM | POA: Diagnosis present

## 2013-02-23 DIAGNOSIS — Z8249 Family history of ischemic heart disease and other diseases of the circulatory system: Secondary | ICD-10-CM

## 2013-02-23 DIAGNOSIS — R55 Syncope and collapse: Secondary | ICD-10-CM

## 2013-02-23 DIAGNOSIS — I480 Paroxysmal atrial fibrillation: Secondary | ICD-10-CM

## 2013-02-23 DIAGNOSIS — R739 Hyperglycemia, unspecified: Secondary | ICD-10-CM

## 2013-02-23 DIAGNOSIS — F411 Generalized anxiety disorder: Secondary | ICD-10-CM | POA: Diagnosis present

## 2013-02-23 DIAGNOSIS — Z7901 Long term (current) use of anticoagulants: Secondary | ICD-10-CM

## 2013-02-23 DIAGNOSIS — Z86711 Personal history of pulmonary embolism: Secondary | ICD-10-CM

## 2013-02-23 DIAGNOSIS — I509 Heart failure, unspecified: Secondary | ICD-10-CM

## 2013-02-23 DIAGNOSIS — M47817 Spondylosis without myelopathy or radiculopathy, lumbosacral region: Secondary | ICD-10-CM

## 2013-02-23 DIAGNOSIS — M48061 Spinal stenosis, lumbar region without neurogenic claudication: Secondary | ICD-10-CM

## 2013-02-23 DIAGNOSIS — I4891 Unspecified atrial fibrillation: Secondary | ICD-10-CM | POA: Diagnosis present

## 2013-02-23 DIAGNOSIS — Z96649 Presence of unspecified artificial hip joint: Secondary | ICD-10-CM

## 2013-02-23 DIAGNOSIS — N179 Acute kidney failure, unspecified: Secondary | ICD-10-CM

## 2013-02-23 DIAGNOSIS — R079 Chest pain, unspecified: Secondary | ICD-10-CM

## 2013-02-23 DIAGNOSIS — D638 Anemia in other chronic diseases classified elsewhere: Secondary | ICD-10-CM | POA: Diagnosis present

## 2013-02-23 DIAGNOSIS — I5032 Chronic diastolic (congestive) heart failure: Secondary | ICD-10-CM | POA: Diagnosis present

## 2013-02-23 DIAGNOSIS — I5033 Acute on chronic diastolic (congestive) heart failure: Principal | ICD-10-CM

## 2013-02-23 DIAGNOSIS — Z66 Do not resuscitate: Secondary | ICD-10-CM | POA: Diagnosis present

## 2013-02-23 DIAGNOSIS — J189 Pneumonia, unspecified organism: Secondary | ICD-10-CM

## 2013-02-23 DIAGNOSIS — Z823 Family history of stroke: Secondary | ICD-10-CM

## 2013-02-23 LAB — HEPATIC FUNCTION PANEL
Bilirubin, Direct: <0.1 mg/dL (ref 0.0–0.3)
Total Bilirubin: 0.1 mg/dL — ABNORMAL LOW (ref 0.3–1.2)

## 2013-02-23 LAB — CBC WITH DIFFERENTIAL/PLATELET
Basophils Absolute: 0 10*3/uL (ref 0.0–0.1)
Basophils Relative: 1 % (ref 0–1)
Hemoglobin: 10.7 g/dL — ABNORMAL LOW (ref 12.0–15.0)
Lymphocytes Relative: 24 % (ref 12–46)
MCHC: 31.1 g/dL (ref 30.0–36.0)
Neutro Abs: 3.1 10*3/uL (ref 1.7–7.7)
Neutrophils Relative %: 60 % (ref 43–77)
RDW: 15.8 % — ABNORMAL HIGH (ref 11.5–15.5)
WBC: 5.2 10*3/uL (ref 4.0–10.5)

## 2013-02-23 LAB — TROPONIN I
Troponin I: <0.3 ng/mL (ref <0.30)
Troponin I: <0.3 ng/mL (ref <0.30)

## 2013-02-23 LAB — BASIC METABOLIC PANEL
Chloride: 100 mEq/L (ref 96–112)
GFR calc Af Amer: 24 mL/min — ABNORMAL LOW (ref >90)
Potassium: 4.2 mEq/L (ref 3.5–5.1)

## 2013-02-23 LAB — PRO B NATRIURETIC PEPTIDE: Pro B Natriuretic peptide (BNP): 1586 pg/mL — ABNORMAL HIGH (ref 0–450)

## 2013-02-23 LAB — LIPASE, BLOOD: Lipase: 23 U/L (ref 11–59)

## 2013-02-23 LAB — PROTIME-INR: INR: 2.73 — ABNORMAL HIGH (ref 0.00–1.49)

## 2013-02-23 MED ORDER — SODIUM CHLORIDE 0.9 % IJ SOLN: 3.0000 mL | INTRAMUSCULAR | Status: DC | PRN

## 2013-02-23 MED ORDER — ALPRAZOLAM 0.25 MG PO TABS: 0.2500 mg | ORAL_TABLET | Freq: Every day | ORAL | Status: DC | PRN

## 2013-02-23 MED ORDER — AMIODARONE HCL 200 MG PO TABS
200.0000 mg | ORAL_TABLET | Freq: Every day | ORAL | Status: DC
  Administered 2013-02-24 – 2013-02-25 (×2): 200 mg via ORAL
  Filled 2013-02-23 (×2): qty 1

## 2013-02-23 MED ORDER — WARFARIN - PHARMACIST DOSING INPATIENT
Freq: Every day | Status: DC
  Administered 2013-02-24: 16:00:00

## 2013-02-23 MED ORDER — SODIUM CHLORIDE 0.9 % IJ SOLN
3.0000 mL | Freq: Two times a day (BID) | INTRAMUSCULAR | Status: DC
  Administered 2013-02-23: 3 mL via INTRAVENOUS

## 2013-02-23 MED ORDER — ACETAMINOPHEN 325 MG PO TABS: 650.0000 mg | ORAL_TABLET | ORAL | Status: DC | PRN

## 2013-02-23 MED ORDER — DORZOLAMIDE HCL 2 % OP SOLN
1.0000 [drp] | Freq: Two times a day (BID) | OPHTHALMIC | Status: DC
  Administered 2013-02-23 – 2013-02-25 (×4): 1 [drp] via OPHTHALMIC
  Filled 2013-02-23: qty 10

## 2013-02-23 MED ORDER — SODIUM CHLORIDE 0.9 % IV SOLN: 250.0000 mL | INTRAVENOUS | Status: DC | PRN

## 2013-02-23 MED ORDER — SIMVASTATIN 10 MG PO TABS
10.0000 mg | ORAL_TABLET | Freq: Every day | ORAL | Status: DC
  Administered 2013-02-24: 10 mg via ORAL
  Filled 2013-02-23: qty 1

## 2013-02-23 MED ORDER — FUROSEMIDE 10 MG/ML IJ SOLN
80.0000 mg | Freq: Two times a day (BID) | INTRAMUSCULAR | Status: DC
  Administered 2013-02-24: 80 mg via INTRAVENOUS
  Filled 2013-02-23: qty 8

## 2013-02-23 MED ORDER — METOPROLOL TARTRATE 50 MG PO TABS
50.0000 mg | ORAL_TABLET | ORAL | Status: DC
  Administered 2013-02-24 – 2013-02-25 (×2): 50 mg via ORAL
  Filled 2013-02-23 (×2): qty 1

## 2013-02-23 MED ORDER — LEVOTHYROXINE SODIUM 100 MCG PO TABS
100.0000 ug | ORAL_TABLET | Freq: Every day | ORAL | Status: DC
  Administered 2013-02-24 – 2013-02-25 (×2): 100 ug via ORAL
  Filled 2013-02-23 (×2): qty 1

## 2013-02-23 MED ORDER — ONDANSETRON HCL 4 MG/2ML IJ SOLN: 4.0000 mg | Freq: Four times a day (QID) | INTRAMUSCULAR | Status: DC | PRN

## 2013-02-23 MED ORDER — POLYETHYLENE GLYCOL 3350 17 GM/SCOOP PO POWD
17.0000 g | Freq: Every day | ORAL | Status: DC
  Filled 2013-02-23: qty 255

## 2013-02-23 MED ORDER — BIOTENE DRY MOUTH MT LIQD
15.0000 mL | Freq: Two times a day (BID) | OROMUCOSAL | Status: DC
  Administered 2013-02-24 – 2013-02-25 (×3): 15 mL via OROMUCOSAL

## 2013-02-23 MED ORDER — BRIMONIDINE TARTRATE 0.15 % OP SOLN
1.0000 [drp] | Freq: Two times a day (BID) | OPHTHALMIC | Status: DC
  Administered 2013-02-24 – 2013-02-25 (×3): 1 [drp] via OPHTHALMIC
  Filled 2013-02-23 (×17): qty 0.1

## 2013-02-23 MED ORDER — BIMATOPROST 0.01 % OP SOLN
1.0000 [drp] | Freq: Every day | OPHTHALMIC | Status: DC
  Administered 2013-02-23 – 2013-02-24 (×2): 1 [drp] via OPHTHALMIC
  Filled 2013-02-23: qty 2.5

## 2013-02-23 MED ORDER — GABAPENTIN 300 MG PO CAPS
300.0000 mg | ORAL_CAPSULE | Freq: Three times a day (TID) | ORAL | Status: DC
  Administered 2013-02-23 – 2013-02-25 (×5): 300 mg via ORAL
  Filled 2013-02-23 (×5): qty 1

## 2013-02-23 MED ORDER — OXYCODONE-ACETAMINOPHEN 5-325 MG PO TABS
1.0000 | ORAL_TABLET | ORAL | Status: DC | PRN
  Administered 2013-02-24 – 2013-02-25 (×3): 1 via ORAL
  Filled 2013-02-23 (×3): qty 1

## 2013-02-23 MED ORDER — WARFARIN SODIUM 2 MG PO TABS
4.0000 mg | ORAL_TABLET | Freq: Once | ORAL | Status: AC
  Administered 2013-02-23: 4 mg via ORAL
  Filled 2013-02-23: qty 2

## 2013-02-23 MED ORDER — BIMATOPROST 0.01 % OP SOLN
OPHTHALMIC | Status: AC
  Filled 2013-02-23: qty 2.5

## 2013-02-23 MED ORDER — FUROSEMIDE 10 MG/ML IJ SOLN
80.0000 mg | Freq: Once | INTRAMUSCULAR | Status: AC
  Administered 2013-02-23: 80 mg via INTRAVENOUS
  Filled 2013-02-23: qty 8

## 2013-02-23 MED ORDER — DOCUSATE SODIUM 100 MG PO CAPS
100.0000 mg | ORAL_CAPSULE | Freq: Two times a day (BID) | ORAL | Status: DC
  Administered 2013-02-23 – 2013-02-25 (×4): 100 mg via ORAL
  Filled 2013-02-23 (×4): qty 1

## 2013-02-23 NOTE — H&P (Signed)
[facility] History and Physical  KAREEMAH GROUNDS WUJ:[id number] DOB: 1927/07/25 DOA: 02/23/2013   PCP: Cassell Smiles., MD  Specialists: She is followed by [facility] and vascular  Chief Complaint: Shortness of breath, and the cough since yesterday  HPI: [name] is a 79 y.o. female with a past medical history of diastolic congestive heart failure, hypertension, coronary artery disease who was in her usual state of health till yesterday when she started noticing a dry cough with shortness of breath. The shortness of breath is mainly with exertion. She denied any symptoms suggestive of orthopnea or PND. She also mentioned, that she's gained about 4 pounds in the last 24-36 hours and has also noticed leg swelling. Denies any fever or chills. No dizziness. No nausea, vomiting. She did have some aching chest pain earlier today, which lasted about 2-3 hours and was 8/10 in intensity. It was located in  central part of her chest, but that has now resolved. There was no radiation of this pain. She denies using oxygen at home. She admits to being compliant with all her medications. She doesn't add any salt to her diet. However, sometimes feels, that the food that she does get has more salt than it should.  Home Medications: Prior to Admission medications   Medication Sig Start Date End Date Taking? Authorizing Provider  acetaminophen (TYLENOL) 325 MG tablet Take 650 mg by mouth every 6 (six) hours as needed.   Yes Historical Provider, MD  albuterol (PROVENTIL) (2.5 MG/3ML) 0.083% nebulizer solution Take 2.5 mg by nebulization every 6 (six) hours as needed. For shortness of breath   Yes Historical Provider, MD  ALPRAZolam (XANAX) 0.25 MG tablet Take 0.25 mg by mouth daily as needed. Anxiety   Yes Historical Provider, MD  amiodarone (PACERONE) 200 MG tablet Take 200 mg by mouth daily.  04/30/11  Yes Historical Provider, MD  bimatoprost (LUMIGAN) 0.01 % SOLN Apply 1 drop to eye at  bedtime.   Yes Historical Provider, MD  brimonidine (ALPHAGAN P) 0.1 % SOLN Place 1 drop into both eyes 2 (two) times daily.   Yes Historical Provider, MD  Calcium Carbonate-Vitamin D (CALCIUM 600 + D PO) Take 1 tablet by mouth 2 (two) times daily.   Yes Historical Provider, MD  docusate sodium (COLACE) 100 MG capsule Take 100 mg by mouth 2 (two) times daily.   Yes Historical Provider, MD  dorzolamide (TRUSOPT) 2 % ophthalmic solution Place 1 drop into both eyes 2 (two) times daily.   Yes Historical Provider, MD  furosemide (LASIX) 80 MG tablet Take 1 tablet (80 mg total) by mouth 2 (two) times daily. 09/26/11 02/23/13 Yes Erick Blinks, MD  gabapentin (NEURONTIN) 300 MG capsule Take 300 mg by mouth 3 (three) times daily.  11/27/11 02/23/13 Yes Vickki Hearing, MD  levothyroxine (SYNTHROID, LEVOTHROID) 100 MCG tablet Take 100 mcg by mouth daily.   Yes Historical Provider, MD  metoprolol (LOPRESSOR) 50 MG tablet Take 50 mg by mouth every morning.   Yes Historical Provider, MD  oxyCODONE-acetaminophen (PERCOCET) 5-325 MG per tablet Take 1 tablet by mouth every 4 (four) hours as needed for pain. 06/19/12  Yes Vida Roller, MD  polyethylene glycol powder (GLYCOLAX/MIRALAX) powder Take 17 g by mouth daily.    Yes Historical Provider, MD  potassium chloride SA (K-DUR,KLOR-CON) 20 MEQ tablet Take 2 tablets (40 mEq total) by mouth daily. 09/12/12  Yes Nimish Normajean Glasgow, MD  pravastatin (PRAVACHOL) 20 MG tablet Take 20 mg by  mouth daily.   Yes Historical Provider, MD  trimethoprim (TRIMPEX) 100 MG tablet Take 100 mg by mouth at bedtime.   Yes Historical Provider, MD  warfarin (COUMADIN) 2 MG tablet Take 2 mg by mouth daily. TAKE ONE TABLET (2MG ) BY MOUTH ON WEDNESDAYS ONLY. (TAKES 4MG  ON ALL OTHER DAYS)   Yes Historical Provider, MD  warfarin (COUMADIN) 4 MG tablet Take 2-4 mg by mouth daily. TAKE ONE TABLET (4MG ) BY MOUTH EVERY DAY EXCEPT ON WEDNESDAYS. TAKE 2MG  ON WEDNESDAYS ONLY   Yes Historical Provider,  MD  Alum & Mag Hydroxide-Simeth (MAGIC MOUTHWASH) SOLN Take 10 mLs by mouth 4 (four) times daily as needed.    Historical Provider, MD  guaiFENesin (MUCINEX) 600 MG 12 hr tablet Take 1 tablet (600 mg total) by mouth 2 (two) times daily. 01/21/13   Erick Blinks, MD  ondansetron (ZOFRAN) 4 MG tablet Take 4 mg by mouth every 6 (six) hours as needed. For nausea    Historical Provider, MD    Allergies:  Allergies  Allergen Reactions  . Naproxen     Patient on coumadin  . Robaxin (Methocarbamol)     Causes patient to be confused    Past Medical History: Past Medical History  Diagnosis Date  . Hypertension   . High cholesterol   . Arthritis   . Hypothyroidism   . Arrhythmia     pacemaker  . Glaucoma   . Pulmonary embolism 06/04/11  . Coronary artery disease   . Atrial fibrillation   . 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 Anxiety     Past Surgical History  Procedure Laterality Date  . Total hip arthroplasty      right side  . Pacemaker insertion    . Appendectomy    . Abdominal hysterectomy      Social History:  reports that she has never smoked. She has never used smokeless tobacco. She reports that she does not drink alcohol or use illicit drugs.  Living Situation: Lives at an assisted living facility Activity Level: Currently getting therapy at the facility. She gets around mostly in a wheelchair, but is trying to walk using a walker   Family History:  Family History  Problem Relation Age of Onset  . Heart attack Mother   . Cancer Father     stomach  . Stroke Sister   . Stroke Brother      Review of Systems - History obtained from the patient General ROS: positive for  - fatigue Psychological ROS: negative Ophthalmic ROS: negative ENT ROS: negative Allergy and Immunology ROS: negative Hematological and Lymphatic ROS: negative Endocrine ROS: negative Respiratory ROS: as in hpi Cardiovascular ROS: as in hpi Gastrointestinal ROS: mentions left sided  soreness for 1 week Genito-Urinary ROS: no dysuria, trouble voiding, or hematuria Musculoskeletal ROS: negative Neurological ROS: no TIA or stroke symptoms Dermatological ROS: negative  Physical Examination  Filed Vitals:   02/23/13 1600 02/23/13 1700 02/23/13 1800 02/23/13 1900  BP: 134/80 138/72 128/76 149/94  Pulse: 86 70    Temp:      TempSrc:      Resp: 26 19 17 17   Height:      Weight:      SpO2: 99% 100%      General appearance: alert, cooperative, appears stated age and no distress Head: Normocephalic, without obvious abnormality, atraumatic Eyes: conjunctivae/corneas clear. PERRL, EOM's intact.  Throat: lips, mucosa, and tongue normal; teeth and gums normal Neck is soft and supple Resp:  Chest crackles bilaterally about halfway up the lung fields. No wheezing. Cardio: regular rate and rhythm, S1, S2 normal, no murmur, click, rub or gallop GI: soft, non-tender; bowel sounds normal; no masses,  no organomegaly Extremities: 1+ edema is noted. Bilateral lower extremities Pulses: 2+ and symmetric Skin: Skin color, texture, turgor normal. No rashes or lesions Lymph nodes: Cervical, supraclavicular, and axillary nodes normal. Neurologic: She is alert and oriented x3. No focal neurological deficit present.  Laboratory Data: Results for orders placed during the hospital encounter of 02/23/13 (from the past 48 hour(s))  CBC WITH DIFFERENTIAL     Status: Abnormal   Collection Time    02/23/13  4:03 PM      Result Value Range   WBC 5.2  4.0 - 10.5 K/uL   RBC 4.38  3.87 - 5.11 MIL/uL   Hemoglobin 10.7 (*) 12.0 - 15.0 g/dL   HCT 47.8 (*) 29.5 - 62.1 %   MCV 78.5  78.0 - 100.0 fL   MCH 24.4 (*) 26.0 - 34.0 pg   MCHC 31.1  30.0 - 36.0 g/dL   RDW 30.8 (*) 65.7 - 84.6 %   Platelets 285  150 - 400 K/uL   Neutrophils Relative % 60  43 - 77 %   Neutro Abs 3.1  1.7 - 7.7 K/uL   Lymphocytes Relative 24  12 - 46 %   Lymphs Abs 1.3  0.7 - 4.0 K/uL   Monocytes Relative 14 (*) 3 -  12 %   Monocytes Absolute 0.7  0.1 - 1.0 K/uL   Eosinophils Relative 2  0 - 5 %   Eosinophils Absolute 0.1  0.0 - 0.7 K/uL   Basophils Relative 1  0 - 1 %   Basophils Absolute 0.0  0.0 - 0.1 K/uL  BASIC METABOLIC PANEL     Status: Abnormal   Collection Time    02/23/13  4:03 PM      Result Value Range   Sodium 139  135 - 145 mEq/L   Potassium 4.2  3.5 - 5.1 mEq/L   Chloride 100  96 - 112 mEq/L   CO2 28  19 - 32 mEq/L   Glucose, Bld 123 (*) 70 - 99 mg/dL   BUN 30 (*) 6 - 23 mg/dL   Creatinine, Ser 9.62 (*) 0.50 - 1.10 mg/dL   Calcium 9.7  8.4 - 95.2 mg/dL   GFR calc non Af Amer 21 (*) >90 mL/min   GFR calc Af Amer 24 (*) >90 mL/min   Comment:            The eGFR has been calculated     using the CKD EPI equation.     This calculation has not been     validated in all clinical     situations.     eGFR's persistently     <90 mL/min signify     possible Chronic Kidney Disease.  TROPONIN I     Status: None   Collection Time    02/23/13  4:03 PM      Result Value Range   Troponin I <0.30  <0.30 ng/mL   Comment:            Due to the release kinetics of cTnI,     a negative result within the first hours     of the onset of symptoms does not rule out     myocardial infarction with certainty.     If myocardial infarction is  still suspected,     repeat the test at appropriate intervals.  PRO B NATRIURETIC PEPTIDE     Status: Abnormal   Collection Time    02/23/13  4:03 PM      Result Value Range   Pro B Natriuretic peptide (BNP) 1586.0 (*) 0 - 450 pg/mL  PROTIME-INR     Status: Abnormal   Collection Time    02/23/13  5:45 PM      Result Value Range   Prothrombin Time 27.6 (*) 11.6 - 15.2 seconds   INR 2.73 (*) 0.00 - 1.49    Radiology Reports: Dg Chest 2 View  02/23/2013   *RADIOLOGY REPORT*  Clinical Data: Shortness of breath  CHEST - 2 VIEW  Comparison: 01/16/2013  Findings: AP and lateral views of the chest show no dense airspace consolidation.  No overt pulmonary  edema.  Vascular congestion noted. The cardiopericardial silhouette is enlarged.  Bones are diffusely demineralized.  Left-sided permanent pacemaker remains in place.  The patient is status post lower thoracic vertebral augmentation. Telemetry leads overlie the chest.  IMPRESSION: Stable.  Cardiomegaly with vascular congestion.   Original Report Authenticated By: Kennith Center, M.D.    Electrocardiogram: Paced rhythm at 90 beats per minute. No fusion beats are noted.  Problem List  Principal Problem:   Acute on chronic diastolic heart failure Active Problems:   PAF (paroxysmal atrial fibrillation)   Sick sinus syndrome   HTN (hypertension)   Hypothyroidism   Long term (current) use of anticoagulants   Acute renal failure   Assessment: This is 79 year old, Caucasian female, who presents for shortness of breath, cough, and weight gain. This is most likely acute diastolic heart failure. Her chest pain was most likely secondary to the heart failure. She has been given Lasix in the ED, and is feeling slightly better.  Plan: #1 acute on chronic diastolic congestive heart failure: We'll admit her to the hospital. Foley will be placed for strict ins and outs. Intravenous Lasix will be continued. She had an echocardiogram recently which showed normal systolic function.  #2 acute on chronic renal failure: Monitor creatinine closely. Monitor urine output closely. As she is diuresed renal function should improve.  #3 history of proximal atrial fibrillation: She has a pacemaker. Warfarin to be continued. Continue with amiodarone  #4 history of hypertension: Continue antihypertensive regimen.   #5 history of hypothyroidism: Continue with Synthroid.  #6 anemia: Hemoglobin is stable.  #7 Vague left-sided abdominal pain: Abdomen is completely benign. She denies any nausea, vomiting, or diarrhea. Have told her that her if her symptoms get worse she needs to tell the nurses so that further evaluation  can be pursued. At this time however , there is no need for any imaging studies. LFTs and lipase will be checked.   DVT Prophylaxis: She is on warfarin Code Status: She is a DO NOT RESUSCITATE Family Communication: Discussed with the patient  Disposition Plan: Admit to telemetry   Further management decisions will depend on results of further testing and patient's response to treatment.  [facility]  [facility] Pager [phone]  If 7PM-7AM, please contact night-coverage [email or web address] Password [facility]  02/23/2013, 8:05 PM

## 2013-02-23 NOTE — ED Provider Notes (Signed)
History    CSN: [id number] Arrival date & time 02/23/13  1546  First MD Initiated Contact with Patient 02/23/13 1555     Chief Complaint  Patient presents with  . Shortness of Breath   (Consider location/radiation/quality/duration/timing/severity/associated sxs/prior Treatment) Patient is a 79 y.o. female presenting with shortness of breath. The history is provided by the patient.  Shortness of Breath Severity:  Moderate Associated symptoms: chest pain and cough   Associated symptoms: no abdominal pain, no headaches, no rash and no vomiting    patient's had shortness of breath and cough. She's had some mild production. She states she feels weak all over. Dull, mild chest pain. No fevers. No nausea vomiting or diarrhea. She states she's put on 4 pounds in a day. She states she was told to come in by her Dr. She states she has increased swelling in her legs. No diaphoresis. No headache. No confusion.  Past Medical History  Diagnosis Date  . Hypertension   . High cholesterol   . Arthritis   . Hypothyroidism   . Arrhythmia     pacemaker  . Glaucoma   . Pulmonary embolism 06/04/11  . Coronary artery disease   . Atrial fibrillation   . 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 Anxiety    Past Surgical History  Procedure Laterality Date  . Total hip arthroplasty      right side  . Pacemaker insertion    . Appendectomy    . Abdominal hysterectomy     Family History  Problem Relation Age of Onset  . Heart attack Mother   . Cancer Father     stomach  . Stroke Sister   . Stroke Brother    History  Substance Use Topics  . Smoking status: Never Smoker   . Smokeless tobacco: Never Used  . Alcohol Use: No   OB History   Grav Para Term Preterm Abortions TAB SAB Ect Mult Living                 Review of Systems  Constitutional: Positive for fatigue. Negative for activity change and appetite change.  HENT: Negative for neck stiffness.   Eyes: Negative for pain.   Respiratory: Positive for cough and shortness of breath. Negative for chest tightness.   Cardiovascular: Positive for chest pain and leg swelling.  Gastrointestinal: Negative for nausea, vomiting, abdominal pain and diarrhea.  Genitourinary: Negative for flank pain.  Musculoskeletal: Negative for back pain.  Skin: Negative for rash.  Neurological: Negative for weakness, numbness and headaches.  Psychiatric/Behavioral: Negative for behavioral problems.    Allergies  Naproxen and Robaxin  Home Medications   No current outpatient prescriptions on file. BP 147/82  Pulse 74  Temp(Src) 97.8 F (36.6 C) (Oral)  Resp 18  Ht 5\' 3"  (1.6 m)  Wt 156 lb 15.5 oz (71.2 kg)  BMI 27.81 kg/m2  SpO2 100% Physical Exam  Nursing note and vitals reviewed. Constitutional: She is oriented to person, place, and time. She appears well-developed and well-nourished.  HENT:  Head: Normocephalic and atraumatic.  Eyes: EOM are normal. Pupils are equal, round, and reactive to light.  Neck: Normal range of motion. Neck supple.  Cardiovascular: Normal rate, regular rhythm and normal heart sounds.   No murmur heard. Pulmonary/Chest: Effort normal. No respiratory distress. She has no wheezes. She has rales.  Mild rales bilateral bases. Occasional cough  Abdominal: Soft. Bowel sounds are normal. She exhibits no distension. There is no tenderness. There  is no rebound and no guarding.  Musculoskeletal: Normal range of motion. She exhibits edema.  Mild bilateral lower extremity pitting edema  Neurological: She is alert and oriented to person, place, and time. No cranial nerve deficit.  Skin: Skin is warm and dry.  Psychiatric: She has a normal mood and affect. Her speech is normal.    ED Course  Procedures (including critical care time) Labs Reviewed  CBC WITH DIFFERENTIAL - Abnormal; Notable for the following:    Hemoglobin 10.7 (*)    HCT 34.4 (*)    MCH 24.4 (*)    RDW 15.8 (*)    Monocytes Relative  14 (*)    All other components within normal limits  BASIC METABOLIC PANEL - Abnormal; Notable for the following:    Glucose, Bld 123 (*)    BUN 30 (*)    Creatinine, Ser 2.06 (*)    GFR calc non Af Amer 21 (*)    GFR calc Af Amer 24 (*)    All other components within normal limits  PRO B NATRIURETIC PEPTIDE - Abnormal; Notable for the following:    Pro B Natriuretic peptide (BNP) 1586.0 (*)    All other components within normal limits  PROTIME-INR - Abnormal; Notable for the following:    Prothrombin Time 27.6 (*)    INR 2.73 (*)    All other components within normal limits  HEPATIC FUNCTION PANEL - Abnormal; Notable for the following:    Total Bilirubin 0.1 (*)    All other components within normal limits  TROPONIN I  LIPASE, BLOOD  TROPONIN I  TROPONIN I  TROPONIN I  CBC  BASIC METABOLIC PANEL  PROTIME-INR   Dg Chest 2 View  02/23/2013   *RADIOLOGY REPORT*  Clinical Data: Shortness of breath  CHEST - 2 VIEW  Comparison: 01/16/2013  Findings: AP and lateral views of the chest show no dense airspace consolidation.  No overt pulmonary edema.  Vascular congestion noted. The cardiopericardial silhouette is enlarged.  Bones are diffusely demineralized.  Left-sided permanent pacemaker remains in place.  The patient is status post lower thoracic vertebral augmentation. Telemetry leads overlie the chest.  IMPRESSION: Stable.  Cardiomegaly with vascular congestion.   Original Report Authenticated By: Kennith Center, M.D.   1. Acute on chronic diastolic heart failure   2. Acute renal failure   3. HTN (hypertension)   4. PAF (paroxysmal atrial fibrillation)      Date: 02/23/2013  Rate: 90  Rhythm: paced  QRS Axis: normal  Intervals: normal  ST/T Wave abnormalities: normal  Conduction Disutrbances:none  Narrative Interpretation:   Old EKG Reviewed: unchanged    MDM  Patient with shortness of breath. Likely worsening of her CHF. BNP is elevated. X-ray shows some congestion. She  appears to be somewhat short of breath to be admitted for further management. She has had an increase of her creatinine to 2.0   Juliet Rude. Rubin Payor, MD 02/23/13 2130

## 2013-02-23 NOTE — ED Notes (Addendum)
EMS reports pt resides at [facility].  Reports has gained 4lb today and c/o SOB and chest pain.  Reports chest pain started after therapy this morning.  EMS got bp 196/108, 98% on room air.  EMS placed pt on 02 at 4liters and started IV.  Pt also took breathing treatment prior to EMS arrival.

## 2013-02-24 DIAGNOSIS — E039 Hypothyroidism, unspecified: Secondary | ICD-10-CM

## 2013-02-24 LAB — CBC
Hemoglobin: 10.2 g/dL — ABNORMAL LOW (ref 12.0–15.0)
MCH: 24.8 pg — ABNORMAL LOW (ref 26.0–34.0)
MCHC: 31.8 g/dL (ref 30.0–36.0)
MCV: 77.9 fL — ABNORMAL LOW (ref 78.0–100.0)
Platelets: 253 10*3/uL (ref 150–400)

## 2013-02-24 LAB — BASIC METABOLIC PANEL
Calcium: 9.3 mg/dL (ref 8.4–10.5)
Creatinine, Ser: 1.65 mg/dL — ABNORMAL HIGH (ref 0.50–1.10)
GFR calc non Af Amer: 27 mL/min — ABNORMAL LOW (ref >90)
Glucose, Bld: 99 mg/dL (ref 70–99)
Sodium: 136 mEq/L (ref 135–145)

## 2013-02-24 LAB — PROTIME-INR: INR: 2.55 — ABNORMAL HIGH (ref 0.00–1.49)

## 2013-02-24 LAB — MRSA PCR SCREENING: MRSA by PCR: NEGATIVE

## 2013-02-24 MED ORDER — FUROSEMIDE 80 MG PO TABS
80.0000 mg | ORAL_TABLET | Freq: Two times a day (BID) | ORAL | Status: DC
  Administered 2013-02-24 – 2013-02-25 (×3): 80 mg via ORAL
  Filled 2013-02-24 (×3): qty 1

## 2013-02-24 MED ORDER — WARFARIN SODIUM 2 MG PO TABS
4.0000 mg | ORAL_TABLET | Freq: Once | ORAL | Status: AC
  Administered 2013-02-24: 4 mg via ORAL
  Filled 2013-02-24: qty 2

## 2013-02-24 NOTE — Progress Notes (Signed)
[facility] PROGRESS NOTE  [name] EAV:[id number] DOB: [birth year] DOA: 02/23/2013 PCP: Cassell Smiles., MD  Assessment/Plan: #1 acute on chronic diastolic congestive heart failure: Volume status -1.9L. Wt 70.4kg from 71.0kg on admission. Lasix changed to po due to lack of IV access. Currently on home dose 80mg  BID.  She had an echocardiogram recently which showed normal systolic function. Denies chest pain this am.  #2 acute on chronic renal failure: Creatinine trending down toward baseline. Urine output good.   #3 history of proximal atrial fibrillation: She has a pacemaker. Warfarin per pharmacy. INR 2.55. Continue with amiodarone  #4 history of hypertension: controlled. Continue antihypertensive regimen.  #5 history of hypothyroidism: TSH 5/14 5.6. Continue with Synthroid.  #6 anemia: likely of chronic disease. Hemoglobin remains stable at baseline. No s/sx bleeding.  #7 Vague left-sided abdominal pain: Abdomen is completely benign. She denies any nausea, vomiting, or diarrhea. Resolved. LFTs and lipase unremarkable.   Code Status: DNR Family Communication:  Disposition Plan: back to facility when ready, hopefully 24-36 hours   Consultants:  none  Procedures:  none  Antibiotics:  none  HPI/Subjective Reports breathing "a little" better this am. Slept well. Reports dry cough. Denies pain/discomfort  Objective: Filed Vitals:   02/23/13 1900 02/23/13 2033 02/23/13 2041 02/24/13 0452  BP: 149/94 147/82  142/67  Pulse:  74  71  Temp:  97.8 F (36.6 C)  98.5 F (36.9 C)  TempSrc:  Oral  Oral  Resp: 17 18  18   Height:      Weight:  71.2 kg (156 lb 15.5 oz)  70.4 kg (155 lb 3.3 oz)  SpO2:  100% 100% 99%    Intake/Output Summary (Last 24 hours) at 02/24/13 0845 Last data filed at 02/24/13 0327  Gross per 24 hour  Intake    240 ml  Output   2150 ml  Net  -1910 ml   Filed Weights   02/23/13 1557 02/23/13 2033 02/24/13 0452  Weight: 70.761 kg (156 lb)  71.2 kg (156 lb 15.5 oz) 70.4 kg (155 lb 3.3 oz)    Exam:   General:  Somewhat drowsy, well nourished  Cardiovascular: RRR no MGR trace LE edema  Respiratory: normal effort at rest, dry coughing, fine crackles auscultated bilaterally bases to mid lobe. No wheeze no rhonchi  Abdomen: obese soft +BS non-tender to palpation  Musculoskeletal: no clubbing no cyanosis   Data Reviewed: Basic Metabolic Panel:  Recent Labs Lab 02/23/13 1603 02/24/13 0257  NA 139 136  K 4.2 3.7  CL 100 99  CO2 28 27  GLUCOSE 123* 99  BUN 30* 29*  CREATININE 2.06* 1.65*  CALCIUM 9.7 9.3   Liver Function Tests:  Recent Labs Lab 02/23/13 1745  AST 24  ALT 15  ALKPHOS 81  BILITOT 0.1*  PROT 7.0  ALBUMIN 3.6    Recent Labs Lab 02/23/13 1745  LIPASE 23   No results found for this basename: AMMONIA,  in the last 168 hours CBC:  Recent Labs Lab 02/23/13 1603 02/24/13 0257  WBC 5.2 6.2  NEUTROABS 3.1  --   HGB 10.7* 10.2*  HCT 34.4* 32.1*  MCV 78.5 77.9*  PLT 285 253   Cardiac Enzymes:  Recent Labs Lab 02/23/13 1603 02/23/13 2051 02/24/13 0256  TROPONINI <0.30 <0.30 <0.30   BNP (last 3 results)  Recent Labs  09/11/12 0431 01/16/13 1745 02/23/13 1603  PROBNP 7411.0* 2857.0* 1586.0*   CBG: No results found for this basename: GLUCAP,  in the  last 168 hours  Recent Results (from the past 240 hour(s))  MRSA PCR SCREENING     Status: None   Collection Time    02/23/13 11:26 PM      Result Value Range Status   MRSA by PCR NEGATIVE  NEGATIVE Final   Comment:            The GeneXpert MRSA Assay (FDA     approved for NASAL specimens     only), is one component of a     comprehensive MRSA colonization     surveillance program. It is not     intended to diagnose MRSA     infection nor to guide or     monitor treatment for     MRSA infections.     Studies: Dg Chest 2 View  02/23/2013   *RADIOLOGY REPORT*  Clinical Data: Shortness of breath  CHEST - 2 VIEW   Comparison: 01/16/2013  Findings: AP and lateral views of the chest show no dense airspace consolidation.  No overt pulmonary edema.  Vascular congestion noted. The cardiopericardial silhouette is enlarged.  Bones are diffusely demineralized.  Left-sided permanent pacemaker remains in place.  The patient is status post lower thoracic vertebral augmentation. Telemetry leads overlie the chest.  IMPRESSION: Stable.  Cardiomegaly with vascular congestion.   Original Report Authenticated By: Kennith Center, M.D.    Scheduled Meds: . amiodarone  200 mg Oral Daily  . antiseptic oral rinse  15 mL Mouth Rinse BID  . bimatoprost  1 drop Both Eyes QHS  . brimonidine  1 drop Both Eyes BID  . docusate sodium  100 mg Oral BID  . dorzolamide  1 drop Both Eyes BID  . furosemide  80 mg Oral BID  . gabapentin  300 mg Oral TID  . levothyroxine  100 mcg Oral QAC breakfast  . metoprolol  50 mg Oral BH-q7a  . polyethylene glycol powder  17 g Oral Daily  . simvastatin  10 mg Oral q1800  . Warfarin - Pharmacist Dosing Inpatient   Does not apply q1800   Continuous Infusions:   Principal Problem:   Acute on chronic diastolic heart failure Active Problems:   PAF (paroxysmal atrial fibrillation)   Sick sinus syndrome   HTN (hypertension)   Hypothyroidism   Long term (current) use of anticoagulants   Acute renal failure    Time spent: 30 minutes    [facility]  [facility] Pager [phone]. If 7PM-7AM, please contact night-coverage at [email or web address], password [facility] 02/24/2013, 8:45 AM  LOS: 1 day   Attending: Patient seen and examined. She has improved with intravenous diuresis. I think she may well be stable for discharge home tomorrow depending on how she feels.

## 2013-02-24 NOTE — Care Management Note (Signed)
    Page 1 of 1   02/25/2013     11:56:04 AM   CARE MANAGEMENT NOTE 02/25/2013  Patient:  [name], [name]   Account Number:  [id number]  Date Initiated:  02/24/2013  Documentation initiated by:  Sharrie Rothman  Subjective/Objective Assessment:   Pt admitted from [facility] with CHF. Pt will return at discharge. Pt uses a walker at the facility and has been active with their in house PT .     Action/Plan:   CSW will arrange discharge back to facility when medically stable.   Anticipated DC Date:  02/27/2013   Anticipated DC Plan:  ASSISTED LIVING / REST HOME  In-house referral  Clinical Social Worker      DC Planning Services  CM consult      Choice offered to / List presented to:             Status of service:  Completed, signed off Medicare Important Message given?  NA - LOS <3 / Initial given by admissions (If response is "NO", the following Medicare IM given date fields will be blank) Date Medicare IM given:   Date Additional Medicare IM given:    Discharge Disposition:  ASSISTED LIVING  Per UR Regulation:    If discussed at Long Length of Stay Meetings, dates discussed:    Comments:  02/25/13 1155 Arlyss Queen, RN BSN CM Pt discharged back to [facility] today. CSW to arrange discharge to facility.  02/24/13 1132 Arlyss Queen, RN BSN CM

## 2013-02-24 NOTE — Progress Notes (Signed)
Patients IV infiltrated, failed attempts to regain access.  MD aware- order to leave out IV, will change IV lasix to PO

## 2013-02-24 NOTE — Clinical Social Work Psychosocial (Signed)
Clinical Social Work Department BRIEF PSYCHOSOCIAL ASSESSMENT 02/24/2013  Patient:  [name]     Account Number:  [id number]     Admit date:  02/23/2013  Clinical Social Worker:  Nancie Neas  Date/Time:  02/24/2013 11:15 AM  Referred by:  CSW  Date Referred:  02/24/2013 Referred for  ALF Placement   Other Referral:   Interview type:  Patient Other interview type:    PSYCHOSOCIAL DATA Living Status:  FACILITY Admitted from facility:  [facility] [place] Level of care:  Assisted Living Primary support name:  Molly Maduro Primary support relationship to patient:  SPOUSE Degree of support available:   supportive    CURRENT CONCERNS Current Concerns  Post-Acute Placement   Other Concerns:    SOCIAL WORK ASSESSMENT / PLAN CSW met with pt at bedside. Pt alert and oriented and well known to CSW from previous admissions. She has been a resident at [facility] for over a year on the AL unit. Pt's husband and son are very involved and supportive and visit pt frequently. When pt d/c from hospice back to ALF last month, she was very deconditioned. Pt indicates she is progressing well with PT. She has worked with the facility's in house PT and is now back to ambulating with a walker with assistance. When alone, pt uses a wheelchair. Yesterday, while in therapy, pt began experiencing SOB and chest pain. Facility does daily weights on pt and found she had gained 4 pounds since the day before. EMS was called and pt admitted with acute on chronic CHF.  Per Kennith Center, Production designer, theatre/television/film at [facility], pt is a limited assist and okay to return at d/c.   Assessment/plan status:  Referral to [facility] Other assessment/ plan:   Information/referral to community resources:   [facility]    PATIENT'S/FAMILY'S RESPONSE TO PLAN OF CARE: Pt reports positive feelings regarding return to [facility] when medically stable. CSW to continue to follow. Possible d/c in next day or  two.       [name], [place] [phone]

## 2013-02-24 NOTE — Progress Notes (Signed)
Occupational Therapy Screen  OT orders received. Patient's chart reviewed. Patient is functioning at baseline for BADL. Patient lives in an ACLF and receives in house PT. Patient requires assist with all BADL. No further acute OT needs at this time; will sign off.   Limmie Patricia, OTR/L,CBIS  02/24/13 11:39AM

## 2013-02-24 NOTE — Evaluation (Signed)
Physical Therapy Evaluation Patient Details Name: [name] MRN: [id number] DOB: 1927/01/08 Today's Date: 02/24/2013 Time: 4098-1191 PT Time Calculation (min): 26 min  PT Assessment / Plan / Recommendation Clinical Impression  Pt was seen for evaluation and found to be very close to prior functional status.  She lives in an ACLF and is primarily w/c dependent.  She ambulates only with assistance of therapist.  She is able to transfer to and from her w/c.  I would recommend continuing HHPT that had recently started.    PT Assessment  All further PT needs can be met in the next venue of care    Follow Up Recommendations  Home health PT    Does the patient have the potential to tolerate intense rehabilitation      Barriers to Discharge        Equipment Recommendations  None recommended by PT    Recommendations for Other Services     Frequency      Precautions / Restrictions Precautions Precautions: Fall Restrictions Weight Bearing Restrictions: No   Pertinent Vitals/Pain       Mobility  Bed Mobility Bed Mobility: Not assessed Transfers Transfers: Sit to Stand;Stand to Sit Sit to Stand: 5: Supervision;With upper extremity assist;From chair/3-in-1 Stand to Sit: 5: Supervision;With upper extremity assist;To chair/3-in-1 Ambulation/Gait Ambulation/Gait Assistance: 4: Min guard Ambulation Distance (Feet): 12 Feet (2 repetitions) Assistive device: Rolling walker Gait Pattern: Trunk flexed;Shuffle Gait velocity: very slow and labored Stairs: No Wheelchair Mobility Wheelchair Mobility: No    Exercises     PT Diagnosis: Difficulty walking;Generalized weakness  PT Problem List: Decreased strength;Decreased activity tolerance;Decreased mobility PT Treatment Interventions:     PT Goals    Visit Information  Last PT Received On: 02/24/13    Subjective Data  Subjective: I just don't feel well Patient Stated Goal: none stated   Prior Functioning  Home  Living Available Help at Discharge: Personal care attendant Type of Home: Assisted living Home Layout: One level Home Adaptive Equipment: Walker - rolling;Wheelchair - manual Additional Comments: pt is primarily w/c dependent...only ambulates with PT Prior Function Level of Independence: Needs assistance Needs Assistance: Bathing;Meal Prep;Light Housekeeping;Gait Bath: Moderate Meal Prep: Total Light Housekeeping: Total Gait Assistance: pt ambulates with walker and therapist who carries a w/c behind her Able to Take Stairs?: No Driving: No Vocation: Retired Musician: No difficulties    Copywriter, advertising Arousal/Alertness: Awake/alert Behavior During Therapy: WFL for tasks assessed/performed Overall Cognitive Status: Within Functional Limits for tasks assessed    Extremity/Trunk Assessment Right Lower Extremity Assessment RLE ROM/Strength/Tone: WFL for tasks assessed RLE Sensation: WFL - Light Touch RLE Coordination: WFL - gross motor Left Lower Extremity Assessment LLE ROM/Strength/Tone: WFL for tasks assessed LLE Sensation: WFL - Light Touch LLE Coordination: WFL - gross motor Trunk Assessment Trunk Assessment: Kyphotic;Other exceptions Trunk Exceptions: kyphosis is severe, has scoliosis   Balance Balance Balance Assessed: No  End of Session PT - End of Session Equipment Utilized During Treatment: Gait belt Activity Tolerance: Patient limited by fatigue Patient left: in chair;with call bell/phone within reach;with chair alarm set  GP     Myrlene Broker L 02/24/2013, 11:30 AM

## 2013-02-24 NOTE — Progress Notes (Signed)
UR chart review completed.  

## 2013-02-24 NOTE — Progress Notes (Signed)
ANTICOAGULATION CONSULT NOTE - Initial Consult  Pharmacy Consult for Coumadin (chronic PTA) Indication: atrial fibrillation  Allergies  Allergen Reactions  . Naproxen     Patient on coumadin  . Robaxin (Methocarbamol)     Causes patient to be confused    Patient Measurements: Height: 5\' 3"  (160 cm) Weight: 155 lb 3.3 oz (70.4 kg) IBW/kg (Calculated) : 52.4  Vital Signs: Temp: 98.5 F (36.9 C) (06/24 0452) Temp src: Oral (06/24 0452) BP: 142/67 mmHg (06/24 0452) Pulse Rate: 71 (06/24 0452)  Labs:  Recent Labs  02/23/13 02/23/13 1603 02/23/13 1745 02/23/13 2051 02/24/13 0256 02/24/13 0257  HGB  --  10.7*  --   --   --  10.2*  HCT  --  34.4*  --   --   --  32.1*  PLT  --  285  --   --   --  253  LABPROT  --   --  27.6*  --   --  26.2*  INR 2.8*  --  2.73*  --   --  2.55*  CREATININE  --  2.06*  --   --   --  1.65*  TROPONINI  --  <0.30  --  <0.30 <0.30  --     Estimated Creatinine Clearance: 23.9 ml/min (by C-G formula based on Cr of 1.65).   Medical History: Past Medical History  Diagnosis Date  . Hypertension   . High cholesterol   . Arthritis   . Hypothyroidism   . Arrhythmia     pacemaker  . Glaucoma   . Pulmonary embolism 06/04/11  . Coronary artery disease   . Atrial fibrillation   . 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 Anxiety     Medications:  Prescriptions prior to admission  Medication Sig Dispense Refill  . acetaminophen (TYLENOL) 325 MG tablet Take 650 mg by mouth every 6 (six) hours as needed.      Marland Kitchen albuterol (PROVENTIL) (2.5 MG/3ML) 0.083% nebulizer solution Take 2.5 mg by nebulization every 6 (six) hours as needed. For shortness of breath      . ALPRAZolam (XANAX) 0.25 MG tablet Take 0.25 mg by mouth daily as needed. Anxiety      . amiodarone (PACERONE) 200 MG tablet Take 200 mg by mouth daily.       . bimatoprost (LUMIGAN) 0.01 % SOLN Apply 1 drop to eye at bedtime.      . brimonidine (ALPHAGAN P) 0.1 % SOLN Place 1 drop into  both eyes 2 (two) times daily.      . Calcium Carbonate-Vitamin D (CALCIUM 600 + D PO) Take 1 tablet by mouth 2 (two) times daily.      Marland Kitchen docusate sodium (COLACE) 100 MG capsule Take 100 mg by mouth 2 (two) times daily.      . dorzolamide (TRUSOPT) 2 % ophthalmic solution Place 1 drop into both eyes 2 (two) times daily.      . furosemide (LASIX) 80 MG tablet Take 1 tablet (80 mg total) by mouth 2 (two) times daily.  60 tablet  0  . gabapentin (NEURONTIN) 300 MG capsule Take 300 mg by mouth 3 (three) times daily.       Marland Kitchen levothyroxine (SYNTHROID, LEVOTHROID) 100 MCG tablet Take 100 mcg by mouth daily.      . metoprolol (LOPRESSOR) 50 MG tablet Take 50 mg by mouth every morning.      Marland Kitchen oxyCODONE-acetaminophen (PERCOCET) 5-325 MG per tablet Take 1 tablet by  mouth every 4 (four) hours as needed for pain.  20 tablet  0  . polyethylene glycol powder (GLYCOLAX/MIRALAX) powder Take 17 g by mouth daily.       . potassium chloride SA (K-DUR,KLOR-CON) 20 MEQ tablet Take 2 tablets (40 mEq total) by mouth daily.      . pravastatin (PRAVACHOL) 20 MG tablet Take 20 mg by mouth daily.      Marland Kitchen trimethoprim (TRIMPEX) 100 MG tablet Take 100 mg by mouth at bedtime.      Marland Kitchen warfarin (COUMADIN) 2 MG tablet Take 2 mg by mouth daily. TAKE ONE TABLET (2MG ) BY MOUTH ON WEDNESDAYS ONLY. (TAKES 4MG  ON ALL OTHER DAYS)      . warfarin (COUMADIN) 4 MG tablet Take 2-4 mg by mouth daily. TAKE ONE TABLET (4MG ) BY MOUTH EVERY DAY EXCEPT ON WEDNESDAYS. TAKE 2MG  ON WEDNESDAYS ONLY      . Alum & Mag Hydroxide-Simeth (MAGIC MOUTHWASH) SOLN Take 10 mLs by mouth 4 (four) times daily as needed.      Marland Kitchen guaiFENesin (MUCINEX) 600 MG 12 hr tablet Take 1 tablet (600 mg total) by mouth 2 (two) times daily.  14 tablet  0  . ondansetron (ZOFRAN) 4 MG tablet Take 4 mg by mouth every 6 (six) hours as needed. For nausea        Assessment: 79yo female on chronic Coumadin PTA for afib and h/o PE.  INR therapeutic on admission and today.  Home dose  listed above.  Pt did receive Coumadin 4mg  po last night on admission per home dosing regimen.  Pt is also on amiodarone.  Goal of Therapy:  INR 2-3 Monitor platelets by anticoagulation protocol: Yes   Plan:  Coumadin 4mg  today x 1 (home dose) INR daily  Margo Aye, Karren Newland A 02/24/2013,9:55 AM

## 2013-02-25 LAB — BASIC METABOLIC PANEL
Chloride: 102 mEq/L (ref 96–112)
GFR calc Af Amer: 27 mL/min — ABNORMAL LOW (ref >90)
Potassium: 3.2 mEq/L — ABNORMAL LOW (ref 3.5–5.1)
Sodium: 137 mEq/L (ref 135–145)

## 2013-02-25 LAB — CBC
Platelets: 257 10*3/uL (ref 150–400)
RDW: 15.7 % — ABNORMAL HIGH (ref 11.5–15.5)
WBC: 4.9 10*3/uL (ref 4.0–10.5)

## 2013-02-25 LAB — PROTIME-INR: INR: 2.73 — ABNORMAL HIGH (ref 0.00–1.49)

## 2013-02-25 MED ORDER — POTASSIUM CHLORIDE CRYS ER 20 MEQ PO TBCR
40.0000 meq | EXTENDED_RELEASE_TABLET | Freq: Once | ORAL | Status: AC
  Administered 2013-02-25: 40 meq via ORAL
  Filled 2013-02-25: qty 2

## 2013-02-25 MED ORDER — GABAPENTIN 300 MG PO CAPS: 300.0000 mg | ORAL_CAPSULE | Freq: Three times a day (TID) | ORAL | Status: DC

## 2013-02-25 MED ORDER — FUROSEMIDE 80 MG PO TABS: 80.0000 mg | ORAL_TABLET | Freq: Two times a day (BID) | ORAL | Status: DC

## 2013-02-25 MED ORDER — WARFARIN SODIUM 2 MG PO TABS: 2.0000 mg | ORAL_TABLET | Freq: Once | ORAL | Status: DC

## 2013-02-25 NOTE — Discharge Summary (Signed)
Physician Discharge Summary  [name] YQM:[id number] DOB: [birth year] DOA: 02/23/2013  PCP: Cassell Smiles., MD  Admit date: 02/23/2013 Discharge date: 02/25/2013  Time spent: 40 minutes  Recommendations for Outpatient Follow-up:  1. Follow up with PCP 1 week for evaluation of symptoms 2. Resume HH PT   Discharge Diagnoses:  Principal Problem:   Acute on chronic diastolic heart failure Active Problems:   PAF (paroxysmal atrial fibrillation)   Sick sinus syndrome   HTN (hypertension)   Hypothyroidism   Long term (current) use of anticoagulants   Acute renal failure   Discharge Condition: stable  Diet recommendation: heart healthy  Filed Weights   02/23/13 2033 02/24/13 0452 02/25/13 0457  Weight: 71.2 kg (156 lb 15.5 oz) 70.4 kg (155 lb 3.3 oz) 69.3 kg (152 lb 12.5 oz)    History of present illness:  [name] is a 79 y.o. female with a past medical history of diastolic congestive heart failure, hypertension, coronary artery disease who was in her usual state of health till one day prior to presentation on 02/23/13 when she started noticing a dry cough with shortness of breath. The shortness of breath was mainly with exertion. She denied any symptoms suggestive of orthopnea or PND. She also mentioned, that she'd gained about 4 pounds in the last 24-36 hours and has also noticed leg swelling. Denied any fever or chills. No dizziness. No nausea, vomiting. She did have some aching chest pain  which lasted about 2-3 hours and was 8/10 in intensity. It was located in central part of her chest, but that had resolved at admission. There was no radiation of this pain. She denied using oxygen at home. She admited to being compliant with all her medications. She does not add any salt to her diet. However, sometimes feels, that the food that she does get has more salt than it should.   Hospital Course:  1 acute on chronic diastolic congestive heart failure: pt admitted to tele.  Was provided with IV lasix initially and transitioned to po on 02/24/13.  Volume status at discharge  -2.9L. Wt at discharge 69.3kg from 71.0kg on admission. Echoc yields EF 55-60% with no wall motion abnormalities.    #2 acute on chronic renal failure: Likely related to IV lasix given on admission.  Urine output good. Creatinine slightly above documented baseline. Recommend BMET in 1 week to trend renal function.  #3 history of proximal atrial fibrillation: She has a pacemaker. INR 2.73 at discharge. Continue coumadin at home dose.  Continue with amiodarone   #4 history of hypertension: controlled during this hospitalization. Continue antihypertensive regimen.   #5 history of hypothyroidism: TSH 5/14 5.6. Continue with Synthroid.   #6 anemia: likely of chronic disease. Hemoglobin remains stable at baseline. No s/sx bleeding.   #7 Vague left-sided abdominal pain: Abdomen is completely benign. She denies any nausea, vomiting, or diarrhea. Resolved at discharge. Tolerating diet well.  LFTs and lipase unremarkable.    Procedures:  none  Consultations:  none  Discharge Exam: Filed Vitals:   02/24/13 0452 02/24/13 1357 02/24/13 2157 02/25/13 0457  BP: 142/67 91/44 119/73 100/62  Pulse: 71 75 70 70  Temp: 98.5 F (36.9 C) 99 F (37.2 C) 98.8 F (37.1 C) 97.3 F (36.3 C)  TempSrc: Oral  Oral Oral  Resp: 18 20 20 20   Height:      Weight: 70.4 kg (155 lb 3.3 oz)   69.3 kg (152 lb 12.5 oz)  SpO2: 99% 99% 95%  96%    General: obese NAD Cardiovascular: RRR no MGR  Trace LE edema Respiratory: normal effort BS with faint expiratory wheeze, no crackles.  Discharge Instructions  Discharge Orders   Future Orders Complete By Expires     Diet - low sodium heart healthy  As directed     Increase activity slowly  As directed         Medication List    TAKE these medications       acetaminophen 325 MG tablet  Commonly known as:  TYLENOL  Take 650 mg by mouth every 6 (six) hours as  needed.     albuterol (2.5 MG/3ML) 0.083% nebulizer solution  Commonly known as:  PROVENTIL  Take 2.5 mg by nebulization every 6 (six) hours as needed. For shortness of breath     ALPRAZolam 0.25 MG tablet  Commonly known as:  XANAX  Take 0.25 mg by mouth daily as needed. Anxiety     amiodarone 200 MG tablet  Commonly known as:  PACERONE  Take 200 mg by mouth daily.     brimonidine 0.1 % Soln  Commonly known as:  ALPHAGAN P  Place 1 drop into both eyes 2 (two) times daily.     CALCIUM 600 + D PO  Take 1 tablet by mouth 2 (two) times daily.     docusate sodium 100 MG capsule  Commonly known as:  COLACE  Take 100 mg by mouth 2 (two) times daily.     dorzolamide 2 % ophthalmic solution  Commonly known as:  TRUSOPT  Place 1 drop into both eyes 2 (two) times daily.     furosemide 80 MG tablet  Commonly known as:  LASIX  Take 1 tablet (80 mg total) by mouth 2 (two) times daily.     gabapentin 300 MG capsule  Commonly known as:  NEURONTIN  Take 1 capsule (300 mg total) by mouth 3 (three) times daily.     guaiFENesin 600 MG 12 hr tablet  Commonly known as:  MUCINEX  Take 1 tablet (600 mg total) by mouth 2 (two) times daily.     levothyroxine 100 MCG tablet  Commonly known as:  SYNTHROID, LEVOTHROID  Take 100 mcg by mouth daily.     LUMIGAN 0.01 % Soln  Generic drug:  bimatoprost  Apply 1 drop to eye at bedtime.     magic mouthwash Soln  Take 10 mLs by mouth 4 (four) times daily as needed.     metoprolol 50 MG tablet  Commonly known as:  LOPRESSOR  Take 50 mg by mouth every morning.     ondansetron 4 MG tablet  Commonly known as:  ZOFRAN  Take 4 mg by mouth every 6 (six) hours as needed. For nausea     oxyCODONE-acetaminophen 5-325 MG per tablet  Commonly known as:  PERCOCET  Take 1 tablet by mouth every 4 (four) hours as needed for pain.     polyethylene glycol powder powder  Commonly known as:  GLYCOLAX/MIRALAX  Take 17 g by mouth daily.     potassium  chloride SA 20 MEQ tablet  Commonly known as:  K-DUR,KLOR-CON  Take 2 tablets (40 mEq total) by mouth daily.     pravastatin 20 MG tablet  Commonly known as:  PRAVACHOL  Take 20 mg by mouth daily.     trimethoprim 100 MG tablet  Commonly known as:  TRIMPEX  Take 100 mg by mouth at bedtime.     warfarin 4  MG tablet  Commonly known as:  COUMADIN  Take 2-4 mg by mouth daily. TAKE ONE TABLET (4MG ) BY MOUTH EVERY DAY EXCEPT ON WEDNESDAYS. TAKE 2MG  ON WEDNESDAYS ONLY       Allergies  Allergen Reactions  . Naproxen     Patient on coumadin  . Robaxin (Methocarbamol)     Causes patient to be confused      The results of significant diagnostics from this hospitalization (including imaging, microbiology, ancillary and laboratory) are listed below for reference.    Significant Diagnostic Studies: Dg Chest 2 View  02/23/2013   *RADIOLOGY REPORT*  Clinical Data: Shortness of breath  CHEST - 2 VIEW  Comparison: 01/16/2013  Findings: AP and lateral views of the chest show no dense airspace consolidation.  No overt pulmonary edema.  Vascular congestion noted. The cardiopericardial silhouette is enlarged.  Bones are diffusely demineralized.  Left-sided permanent pacemaker remains in place.  The patient is status post lower thoracic vertebral augmentation. Telemetry leads overlie the chest.  IMPRESSION: Stable.  Cardiomegaly with vascular congestion.   Original Report Authenticated By: Kennith Center, M.D.    Microbiology: Recent Results (from the past 240 hour(s))  MRSA PCR SCREENING     Status: None   Collection Time    02/23/13 11:26 PM      Result Value Range Status   MRSA by PCR NEGATIVE  NEGATIVE Final   Comment:            The GeneXpert MRSA Assay (FDA     approved for NASAL specimens     only), is one component of a     comprehensive MRSA colonization     surveillance program. It is not     intended to diagnose MRSA     infection nor to guide or     monitor treatment for      MRSA infections.     Labs: Basic Metabolic Panel:  Recent Labs Lab 02/23/13 1603 02/24/13 0257 02/25/13 0457  NA 139 136 137  K 4.2 3.7 3.2*  CL 100 99 102  CO2 28 27 27   GLUCOSE 123* 99 94  BUN 30* 29* 32*  CREATININE 2.06* 1.65* 1.91*  CALCIUM 9.7 9.3 9.5   Liver Function Tests:  Recent Labs Lab 02/23/13 1745  AST 24  ALT 15  ALKPHOS 81  BILITOT 0.1*  PROT 7.0  ALBUMIN 3.6    Recent Labs Lab 02/23/13 1745  LIPASE 23   No results found for this basename: AMMONIA,  in the last 168 hours CBC:  Recent Labs Lab 02/23/13 1603 02/24/13 0257 02/25/13 0457  WBC 5.2 6.2 4.9  NEUTROABS 3.1  --   --   HGB 10.7* 10.2* 10.5*  HCT 34.4* 32.1* 33.3*  MCV 78.5 77.9* 77.8*  PLT 285 253 257   Cardiac Enzymes:  Recent Labs Lab 02/23/13 1603 02/23/13 2051 02/24/13 0256  TROPONINI <0.30 <0.30 <0.30   BNP: BNP (last 3 results)  Recent Labs  09/11/12 0431 01/16/13 1745 02/23/13 1603  PROBNP 7411.0* 2857.0* 1586.0*         Signed:  Toya Smothers M  [facility] 02/25/2013, 11:52 AM Attending: Patient seen and examined. Patient is medically stable for discharge. Agree with above.

## 2013-02-25 NOTE — Progress Notes (Signed)
ANTICOAGULATION CONSULT NOTE  Pharmacy Consult for Coumadin  Indication: atrial fibrillation  Allergies  Allergen Reactions  . Naproxen     Patient on coumadin  . Robaxin (Methocarbamol)     Causes patient to be confused    Patient Measurements: Height: 5\' 3"  (160 cm) Weight: 152 lb 12.5 oz (69.3 kg) IBW/kg (Calculated) : 52.4  Vital Signs: Temp: 97.3 F (36.3 C) (06/25 0457) Temp src: Oral (06/25 0457) BP: 100/62 mmHg (06/25 0457) Pulse Rate: 70 (06/25 0457)  Labs:  Recent Labs  02/23/13 1603 02/23/13 1745 02/23/13 2051 02/24/13 0256 02/24/13 0257 02/25/13 0457  HGB 10.7*  --   --   --  10.2* 10.5*  HCT 34.4*  --   --   --  32.1* 33.3*  PLT 285  --   --   --  253 257  LABPROT  --  27.6*  --   --  26.2* 27.6*  INR  --  2.73*  --   --  2.55* 2.73*  CREATININE 2.06*  --   --   --  1.65* 1.91*  TROPONINI <0.30  --  <0.30 <0.30  --   --     Estimated Creatinine Clearance: 20.5 ml/min (by C-G formula based on Cr of 1.91).   Medical History: Past Medical History  Diagnosis Date  . Hypertension   . High cholesterol   . Arthritis   . Hypothyroidism   . Arrhythmia     pacemaker  . Glaucoma   . Pulmonary embolism 06/04/11  . Coronary artery disease   . Atrial fibrillation   . 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 Anxiety     Medications:  Prescriptions prior to admission  Medication Sig Dispense Refill  . acetaminophen (TYLENOL) 325 MG tablet Take 650 mg by mouth every 6 (six) hours as needed.      Marland Kitchen albuterol (PROVENTIL) (2.5 MG/3ML) 0.083% nebulizer solution Take 2.5 mg by nebulization every 6 (six) hours as needed. For shortness of breath      . ALPRAZolam (XANAX) 0.25 MG tablet Take 0.25 mg by mouth daily as needed. Anxiety      . amiodarone (PACERONE) 200 MG tablet Take 200 mg by mouth daily.       . bimatoprost (LUMIGAN) 0.01 % SOLN Apply 1 drop to eye at bedtime.      . brimonidine (ALPHAGAN P) 0.1 % SOLN Place 1 drop into both eyes 2 (two)  times daily.      . Calcium Carbonate-Vitamin D (CALCIUM 600 + D PO) Take 1 tablet by mouth 2 (two) times daily.      Marland Kitchen docusate sodium (COLACE) 100 MG capsule Take 100 mg by mouth 2 (two) times daily.      . dorzolamide (TRUSOPT) 2 % ophthalmic solution Place 1 drop into both eyes 2 (two) times daily.      . furosemide (LASIX) 80 MG tablet Take 1 tablet (80 mg total) by mouth 2 (two) times daily.  60 tablet  0  . gabapentin (NEURONTIN) 300 MG capsule Take 300 mg by mouth 3 (three) times daily.       Marland Kitchen levothyroxine (SYNTHROID, LEVOTHROID) 100 MCG tablet Take 100 mcg by mouth daily.      . metoprolol (LOPRESSOR) 50 MG tablet Take 50 mg by mouth every morning.      Marland Kitchen oxyCODONE-acetaminophen (PERCOCET) 5-325 MG per tablet Take 1 tablet by mouth every 4 (four) hours as needed for pain.  20 tablet  0  . polyethylene glycol powder (GLYCOLAX/MIRALAX) powder Take 17 g by mouth daily.       . potassium chloride SA (K-DUR,KLOR-CON) 20 MEQ tablet Take 2 tablets (40 mEq total) by mouth daily.      . pravastatin (PRAVACHOL) 20 MG tablet Take 20 mg by mouth daily.      Marland Kitchen trimethoprim (TRIMPEX) 100 MG tablet Take 100 mg by mouth at bedtime.      Marland Kitchen warfarin (COUMADIN) 2 MG tablet Take 2 mg by mouth daily. TAKE ONE TABLET (2MG ) BY MOUTH ON WEDNESDAYS ONLY. (TAKES 4MG  ON ALL OTHER DAYS)      . warfarin (COUMADIN) 4 MG tablet Take 2-4 mg by mouth daily. TAKE ONE TABLET (4MG ) BY MOUTH EVERY DAY EXCEPT ON WEDNESDAYS. TAKE 2MG  ON WEDNESDAYS ONLY      . Alum & Mag Hydroxide-Simeth (MAGIC MOUTHWASH) SOLN Take 10 mLs by mouth 4 (four) times daily as needed.      Marland Kitchen guaiFENesin (MUCINEX) 600 MG 12 hr tablet Take 1 tablet (600 mg total) by mouth 2 (two) times daily.  14 tablet  0  . ondansetron (ZOFRAN) 4 MG tablet Take 4 mg by mouth every 6 (six) hours as needed. For nausea        Assessment: 79yo female on chronic Coumadin PTA for afib and h/o PE.  INR therapeutic on admission.  Home dose listed above.   INR remains  therapeutic on home regimen.  No bleeding noted.   Goal of Therapy:  INR 2-3   Plan:  Coumadin 2mg  today x 1 (home dose) INR daily  Markeis Allman, [facility] 02/25/2013,10:11 AM

## 2013-02-25 NOTE — Clinical Social Work Note (Signed)
Pt d/c today back to [facility]. Pt and facility aware and agreeable. Pt's son to provide transport. D/C summary and FL2 faxed.  Derenda Fennel, [place] [phone]

## 2013-02-26 NOTE — Progress Notes (Addendum)
AVS reviewed with patient.  Pt instructed on which medications are remaining for the rest of the day.  Heart Failure Packet provided to patient.  Verbalized understanding of d/c instructions.  Teach back method used.  Packet for [employer] sent with patient.  Pt transported by RN via w/c to main entrance for discharge.  Pt's son transported patient back to [facility].  Pt stable at time of discharge.  Stated all belongings were intact and in her possession at time of discharge.

## 2013-03-17 ENCOUNTER — Encounter: Payer: Self-pay | Admitting: Cardiovascular Disease

## 2013-03-23 LAB — PROTIME-INR: INR: 2.8 — AB (ref <=1.1)

## 2013-03-24 ENCOUNTER — Ambulatory Visit (INDEPENDENT_AMBULATORY_CARE_PROVIDER_SITE_OTHER): Payer: Self-pay | Admitting: Pharmacist Clinician (PhC)/ Clinical Pharmacy Specialist

## 2013-03-24 DIAGNOSIS — I4891 Unspecified atrial fibrillation: Secondary | ICD-10-CM

## 2013-03-24 DIAGNOSIS — I480 Paroxysmal atrial fibrillation: Secondary | ICD-10-CM

## 2013-03-24 DIAGNOSIS — Z7901 Long term (current) use of anticoagulants: Secondary | ICD-10-CM

## 2013-04-06 ENCOUNTER — Telehealth: Payer: Self-pay | Admitting: Pharmacist Clinician (PhC)/ Clinical Pharmacy Specialist

## 2013-04-06 NOTE — Telephone Encounter (Signed)
Spoke with [employer] with [facility].  Pharmacy cannot get [name] brand warfarin anymore and wants to change to [place].  Gave permission to change brands.

## 2013-04-06 NOTE — Telephone Encounter (Signed)
Wants to use another generic Warfarin.Please call

## 2013-04-17 ENCOUNTER — Other Ambulatory Visit: Payer: Self-pay | Admitting: *Deleted

## 2013-04-17 MED ORDER — PRAVASTATIN SODIUM 20 MG PO TABS: 20.0000 mg | ORAL_TABLET | Freq: Every day | ORAL | Status: DC

## 2013-04-21 ENCOUNTER — Ambulatory Visit (INDEPENDENT_AMBULATORY_CARE_PROVIDER_SITE_OTHER): Payer: Self-pay | Admitting: Pharmacist Clinician (PhC)/ Clinical Pharmacy Specialist

## 2013-04-21 DIAGNOSIS — I480 Paroxysmal atrial fibrillation: Secondary | ICD-10-CM

## 2013-04-21 DIAGNOSIS — Z7901 Long term (current) use of anticoagulants: Secondary | ICD-10-CM

## 2013-04-21 DIAGNOSIS — I4891 Unspecified atrial fibrillation: Secondary | ICD-10-CM

## 2013-04-29 ENCOUNTER — Ambulatory Visit (INDEPENDENT_AMBULATORY_CARE_PROVIDER_SITE_OTHER): Payer: Self-pay | Admitting: Pharmacist Clinician (PhC)/ Clinical Pharmacy Specialist

## 2013-04-29 DIAGNOSIS — Z7901 Long term (current) use of anticoagulants: Secondary | ICD-10-CM

## 2013-04-29 DIAGNOSIS — I480 Paroxysmal atrial fibrillation: Secondary | ICD-10-CM

## 2013-04-29 DIAGNOSIS — I4891 Unspecified atrial fibrillation: Secondary | ICD-10-CM

## 2013-04-29 LAB — POCT INR: INR: 1.6

## 2013-05-12 ENCOUNTER — Ambulatory Visit (INDEPENDENT_AMBULATORY_CARE_PROVIDER_SITE_OTHER): Payer: Medicare Other | Admitting: Pharmacist Clinician (PhC)/ Clinical Pharmacy Specialist

## 2013-05-12 DIAGNOSIS — Z7901 Long term (current) use of anticoagulants: Secondary | ICD-10-CM

## 2013-05-12 DIAGNOSIS — I4891 Unspecified atrial fibrillation: Secondary | ICD-10-CM

## 2013-05-12 DIAGNOSIS — I480 Paroxysmal atrial fibrillation: Secondary | ICD-10-CM

## 2013-05-12 LAB — PROTIME-INR: INR: 3.4 — AB (ref <=1.1)

## 2013-05-19 ENCOUNTER — Ambulatory Visit (INDEPENDENT_AMBULATORY_CARE_PROVIDER_SITE_OTHER): Payer: Medicare Other | Admitting: Pharmacist Clinician (PhC)/ Clinical Pharmacy Specialist

## 2013-05-19 DIAGNOSIS — I480 Paroxysmal atrial fibrillation: Secondary | ICD-10-CM

## 2013-05-19 DIAGNOSIS — I4891 Unspecified atrial fibrillation: Secondary | ICD-10-CM

## 2013-05-19 DIAGNOSIS — Z7901 Long term (current) use of anticoagulants: Secondary | ICD-10-CM

## 2013-05-25 ENCOUNTER — Ambulatory Visit (INDEPENDENT_AMBULATORY_CARE_PROVIDER_SITE_OTHER): Payer: Medicare Other | Admitting: Pharmacist Clinician (PhC)/ Clinical Pharmacy Specialist

## 2013-05-25 DIAGNOSIS — I4891 Unspecified atrial fibrillation: Secondary | ICD-10-CM

## 2013-05-25 DIAGNOSIS — Z7901 Long term (current) use of anticoagulants: Secondary | ICD-10-CM

## 2013-05-25 DIAGNOSIS — I480 Paroxysmal atrial fibrillation: Secondary | ICD-10-CM

## 2013-06-11 ENCOUNTER — Encounter: Payer: Self-pay | Admitting: Cardiovascular Disease

## 2013-06-11 ENCOUNTER — Ambulatory Visit (INDEPENDENT_AMBULATORY_CARE_PROVIDER_SITE_OTHER): Payer: Medicare Other | Admitting: Cardiovascular Disease

## 2013-06-11 ENCOUNTER — Other Ambulatory Visit: Payer: Self-pay | Admitting: Cardiovascular Disease

## 2013-06-11 VITALS — BP 106/54 | HR 85 | Ht 63.5 in | Wt 153.2 lb

## 2013-06-11 DIAGNOSIS — I5032 Chronic diastolic (congestive) heart failure: Secondary | ICD-10-CM

## 2013-06-11 DIAGNOSIS — I4891 Unspecified atrial fibrillation: Secondary | ICD-10-CM

## 2013-06-11 DIAGNOSIS — I480 Paroxysmal atrial fibrillation: Secondary | ICD-10-CM

## 2013-06-11 DIAGNOSIS — Z950 Presence of cardiac pacemaker: Secondary | ICD-10-CM | POA: Insufficient documentation

## 2013-06-11 LAB — PACEMAKER DEVICE OBSERVATION
AL IMPEDENCE PM: 515 Ohm
BATTERY VOLTAGE: 2.79 V
RV LEAD AMPLITUDE: 15.68 mv

## 2013-06-11 NOTE — Assessment & Plan Note (Addendum)
She appears to be clinically euvolemic. Assessment of her functional status is impaired by her inability to exercise. She does not appear to require any changes in her current dose of loop diuretic. Continue beta blocker to prevent rapid ventricular rates with future it will for relation recurrences.

## 2013-06-11 NOTE — Progress Notes (Signed)
Patient ID: [name], female   DOB: Apr 13, 1927, 79 y.o.   MRN: [id number]      Reason for office visit Atrial fibrillation, pacemaker, congestive heart failure (diastolic chronic)   She is generally doing well and seems to benefit from the assisted living facility. She does some physical therapy, but is otherwise very sedentary. She has "good days and bad days", but cannot be more specific. Today is a "good day". She has not had any falls or bleeding problems, new neuro deficits, angina or dyspnea at rest. She is unaware of palpitations. Her pacemaker discloses a lengthy AF episode from May to September - she has difficulty noticing a change in status during that time. The episode resolved without any changes in meds. She takes warfarin and amiodarone. LFTS were normal in June, TSH was borderline high.  Allergies  Allergen Reactions  . Naproxen     Patient on coumadin  . Robaxin [Methocarbamol]     Causes patient to be confused    Current Outpatient Prescriptions  Medication Sig Dispense Refill  . acetaminophen (TYLENOL) 325 MG tablet Take 650 mg by mouth every 6 (six) hours as needed.      Marland Kitchen albuterol (PROVENTIL) (2.5 MG/3ML) 0.083% nebulizer solution Take 2.5 mg by nebulization every 6 (six) hours as needed. For shortness of breath      . ALPRAZolam (XANAX) 0.25 MG tablet Take 0.25 mg by mouth daily as needed. Anxiety      . Alum & Mag Hydroxide-Simeth (MAGIC MOUTHWASH) SOLN Take 10 mLs by mouth 4 (four) times daily as needed.      Marland Kitchen amiodarone (PACERONE) 200 MG tablet Take 200 mg by mouth daily.       Marland Kitchen amoxicillin (AMOXIL) 500 MG capsule Take 4 capsules by mouth prior to appointment.      . bimatoprost (LUMIGAN) 0.01 % SOLN Apply 1 drop to eye at bedtime.      . brimonidine (ALPHAGAN P) 0.1 % SOLN Place 1 drop into both eyes 2 (two) times daily.      . Calcium Carbonate-Vitamin D (CALCIUM 600 + D PO) Take 1 tablet by mouth 2 (two) times daily.      Marland Kitchen docusate sodium  (COLACE) 100 MG capsule Take 100 mg by mouth 2 (two) times daily.      . dorzolamide (TRUSOPT) 2 % ophthalmic solution Place 1 drop into both eyes 2 (two) times daily.      . furosemide (LASIX) 80 MG tablet Take 1 tablet (80 mg total) by mouth 2 (two) times daily.  60 tablet  0  . gabapentin (NEURONTIN) 300 MG capsule Take 1 capsule (300 mg total) by mouth 3 (three) times daily.  30 capsule  0  . guaiFENesin (MUCINEX) 600 MG 12 hr tablet Take 1 tablet (600 mg total) by mouth 2 (two) times daily.  14 tablet  0  . hydrOXYzine (VISTARIL) 25 MG capsule Take 1 capsule by mouth as needed.      Marland Kitchen levothyroxine (SYNTHROID, LEVOTHROID) 100 MCG tablet Take 100 mcg by mouth daily.      . metoprolol (LOPRESSOR) 50 MG tablet Take 50 mg by mouth every morning.      . ondansetron (ZOFRAN) 4 MG tablet Take 4 mg by mouth every 6 (six) hours as needed. For nausea      . oxyCODONE-acetaminophen (PERCOCET) 5-325 MG per tablet Take 1 tablet by mouth every 4 (four) hours as needed for pain.  20 tablet  0  .  polyethylene glycol powder (GLYCOLAX/MIRALAX) powder Take 17 g by mouth daily.       . potassium chloride SA (K-DUR,KLOR-CON) 20 MEQ tablet Take 2 tablets (40 mEq total) by mouth daily.      . pravastatin (PRAVACHOL) 20 MG tablet Take 1 tablet (20 mg total) by mouth daily.  30 tablet  11  . trimethoprim (TRIMPEX) 100 MG tablet Take 100 mg by mouth at bedtime.      Marland Kitchen warfarin (COUMADIN) 4 MG tablet Take 1 tablet by mouth on Mondays and Fridays. Take 1/2 tablet by mouth daily except for Mondays and Fridays.       No current facility-administered medications for this visit.    Past Medical History  Diagnosis Date  . Hypertension   . High cholesterol   . Arthritis   . Hypothyroidism   . Arrhythmia     pacemaker  . Glaucoma   . Pulmonary embolism 06/04/11  . Coronary artery disease   . Atrial fibrillation   . 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 Anxiety     Past Surgical History  Procedure  Laterality Date  . Total hip arthroplasty      right side  . Pacemaker insertion    . Appendectomy    . Abdominal hysterectomy      Family History  Problem Relation Age of Onset  . Heart attack Mother   . Cancer Father     stomach  . Stroke Sister   . Stroke Brother     History   Social History  . Marital Status: Married    Spouse Name: N/A    Number of Children: N/A  . Years of Education: N/A   Occupational History  . Not on file.   Social History Main Topics  . Smoking status: Never Smoker   . Smokeless tobacco: Never Used  . Alcohol Use: No  . Drug Use: No  . Sexual Activity: No   Other Topics Concern  . Not on file   Social History Narrative  . No narrative on file    Review of systems: The patient specifically denies any chest pain at rest or with exertion, dyspnea at rest or with exertion, orthopnea, paroxysmal nocturnal dyspnea, syncope, palpitations, focal neurological deficits, intermittent claudication, lower extremity edema, unexplained weight gain, cough, hemoptysis or wheezing.  The patient also denies abdominal pain, nausea, vomiting, dysphagia, diarrhea, constipation, polyuria, polydipsia, dysuria, hematuria, frequency, urgency, abnormal bleeding or bruising, fever, chills, unexpected weight changes, mood swings, change in skin or hair texture, change in voice quality, auditory or visual problems, allergic reactions or rashes, new musculoskeletal complaints other than usual "aches and pains".   PHYSICAL EXAM BP 106/54  Pulse 85  Ht 5' 3.5" (1.613 m)  Wt 153 lb 3.2 oz (69.491 kg)  BMI 26.71 kg/m2  General: Alert, oriented x3, no distress, in a wheelchair Head: no evidence of trauma, PERRL, EOMI, no exophtalmos or lid lag, no myxedema, no xanthelasma; normal ears, nose and oropharynx Neck: normal jugular venous pulsations and no hepatojugular reflux; brisk carotid pulses without delay and no carotid bruits Chest: clear to auscultation, no signs  of consolidation by percussion or palpation, normal fremitus, symmetrical and full respiratory excursions; prominent kyphoscoliosis Cardiovascular: normal position and quality of the apical impulse, regular rhythm, normal first and paradoxically split second heart sounds, no murmurs, rubs or gallops Abdomen: no tenderness or distention, no masses by palpation, no abnormal pulsatility or arterial bruits, normal bowel sounds, no hepatosplenomegaly Extremities: no  clubbing, cyanosis or edema; 2+ radial, ulnar and brachial pulses bilaterally; 2+ right femoral, posterior tibial and dorsalis pedis pulses; 2+ left femoral, posterior tibial and dorsalis pedis pulses; no subclavian or femoral bruits Neurological: grossly nonfocal   EKG: 100% AV paced  Lipid Panel  No results found for this basename: chol, trig, hdl, cholhdl, vldl, ldlcalc    BMET    Component Value Date/Time   NA 137 02/25/2013 0457   K 3.2* 02/25/2013 0457   CL 102 02/25/2013 0457   CO2 27 02/25/2013 0457   GLUCOSE 94 02/25/2013 0457   BUN 32* 02/25/2013 0457   CREATININE 1.91* 02/25/2013 0457   CALCIUM 9.5 02/25/2013 0457   GFRNONAA 23* 02/25/2013 0457   GFRAA 27* 02/25/2013 0457     ASSESSMENT AND PLAN PAF (paroxysmal atrial fibrillation) She has just had a prolonged episode of atrial fibrillation lasting for about 3 months and should remain on lifelong warfarin anticoagulation or an equivalent drug. We did discuss (at the son's question) the knee or anticoagulants. I reviewed the benefits and costs of these new medications. At this point in time they decided not to switch to a novel anticoagulant. I would continue treatment with a low dose of amiodarone. It is very difficult to decide whether or not the episode of atrial fibrillation is responsible for her fatigue. Her description of her symptoms is nonspecific and inconsistent. Nevertheless, I would probably recommend continuing antiarrhythmic therapy as long as it is not  associated with side effects. Need to check thyroid function tests and liver function tests at least twice a year  Chronic diastolic heart failure She appears to be clinically euvolemic. Assessment of her functional status is impaired by her inability to exercise. She does not appear to require any changes in her current dose of loop diuretic. Continue beta blocker to prevent rapid ventricular rates with future it will for relation recurrences.  Pacemaker - dual chamber Medtronic 2006, gen change 2012 Comprehensive in office check today shows normal device function. No permanent changes were made to device settings. The device discloses a very lengthy episode of persistent atrial fibrillation from late May 2 minutes September. During that period of time ventricular rate control was almost in a verbally good. Notes that she tolerates VVI pacing very poorly (prominent pacemaker syndrome), but does not appear to have a lot of symptoms with atrial fibrillation. CARELINK check q 3 months. Office f/u in 12 months Orders Placed This Encounter  Procedures  . EKG 12-Lead   Meds ordered this encounter  Medications  . hydrOXYzine (VISTARIL) 25 MG capsule    Sig: Take 1 capsule by mouth as needed.  Marland Kitchen amoxicillin (AMOXIL) 500 MG capsule    Sig: Take 4 capsules by mouth prior to appointment.    Junious Silk, MD, [facility] [phone] office [phone] pager

## 2013-06-11 NOTE — Patient Instructions (Signed)
Your home pacemaker transmission will be due on 09-14-2013. After your transmission has been sent you should receive a letter within 7-10 days with your next transmission date. Please call the office if your letter is not received.  Your physician recommends that you schedule a follow-up appointment in: 12 months

## 2013-06-11 NOTE — Assessment & Plan Note (Signed)
Comprehensive in office check today shows normal device function. No permanent changes were made to device settings. The device discloses a very lengthy episode of persistent atrial fibrillation from late May 2 minutes September. During that period of time ventricular rate control was almost in a verbally good. Notes that she tolerates VVI pacing very poorly (prominent pacemaker syndrome), but does not appear to have a lot of symptoms with atrial fibrillation.

## 2013-06-11 NOTE — Assessment & Plan Note (Signed)
She has just had a prolonged episode of atrial fibrillation lasting for about 3 months and should remain on lifelong warfarin anticoagulation or an equivalent drug. We did discuss (at the son's question) the knee or anticoagulants. I reviewed the benefits and costs of these new medications. At this point in time they decided not to switch to a novel anticoagulant. I would continue treatment with a low dose of amiodarone. It is very difficult to decide whether or not the episode of atrial fibrillation is responsible for her fatigue. Her description of her symptoms is nonspecific and inconsistent. Nevertheless, I would probably recommend continuing antiarrhythmic therapy as long as it is not associated with side effects. Need to check thyroid function tests and liver function tests at least twice a year

## 2013-06-22 LAB — PROTIME-INR: INR: 2 — AB (ref <=1.1)

## 2013-06-23 ENCOUNTER — Ambulatory Visit (INDEPENDENT_AMBULATORY_CARE_PROVIDER_SITE_OTHER): Payer: Medicare Other | Admitting: Pharmacist Clinician (PhC)/ Clinical Pharmacy Specialist

## 2013-06-23 DIAGNOSIS — I4891 Unspecified atrial fibrillation: Secondary | ICD-10-CM

## 2013-06-23 DIAGNOSIS — Z7901 Long term (current) use of anticoagulants: Secondary | ICD-10-CM

## 2013-06-23 DIAGNOSIS — I480 Paroxysmal atrial fibrillation: Secondary | ICD-10-CM

## 2013-07-01 ENCOUNTER — Ambulatory Visit (INDEPENDENT_AMBULATORY_CARE_PROVIDER_SITE_OTHER): Payer: Medicare Other | Admitting: Pharmacist Clinician (PhC)/ Clinical Pharmacy Specialist

## 2013-07-01 DIAGNOSIS — I4891 Unspecified atrial fibrillation: Secondary | ICD-10-CM

## 2013-07-01 DIAGNOSIS — I480 Paroxysmal atrial fibrillation: Secondary | ICD-10-CM

## 2013-07-01 DIAGNOSIS — Z7901 Long term (current) use of anticoagulants: Secondary | ICD-10-CM

## 2013-07-01 LAB — PROTIME-INR: INR: 2.1 — AB (ref <=1.1)

## 2013-08-03 ENCOUNTER — Emergency Department (HOSPITAL_COMMUNITY): Payer: Medicare Other

## 2013-08-03 ENCOUNTER — Inpatient Hospital Stay (HOSPITAL_COMMUNITY)
Admission: EM | Admit: 2013-08-03 | Discharge: 2013-08-10 | DRG: 191 | Disposition: A | Discharge status: Nursing Home (Includes ICF) | Payer: Medicare Other | Attending: Internal Medicine | Admitting: Internal Medicine

## 2013-08-03 ENCOUNTER — Encounter (HOSPITAL_COMMUNITY): Payer: Self-pay | Admitting: Emergency Medicine

## 2013-08-03 DIAGNOSIS — M5430 Sciatica, unspecified side: Secondary | ICD-10-CM

## 2013-08-03 DIAGNOSIS — I509 Heart failure, unspecified: Secondary | ICD-10-CM

## 2013-08-03 DIAGNOSIS — M129 Arthropathy, unspecified: Secondary | ICD-10-CM | POA: Diagnosis present

## 2013-08-03 DIAGNOSIS — J189 Pneumonia, unspecified organism: Secondary | ICD-10-CM

## 2013-08-03 DIAGNOSIS — H409 Unspecified glaucoma: Secondary | ICD-10-CM | POA: Diagnosis present

## 2013-08-03 DIAGNOSIS — M810 Age-related osteoporosis without current pathological fracture: Secondary | ICD-10-CM | POA: Diagnosis present

## 2013-08-03 DIAGNOSIS — M47817 Spondylosis without myelopathy or radiculopathy, lumbosacral region: Secondary | ICD-10-CM

## 2013-08-03 DIAGNOSIS — I495 Sick sinus syndrome: Secondary | ICD-10-CM

## 2013-08-03 DIAGNOSIS — D72829 Elevated white blood cell count, unspecified: Secondary | ICD-10-CM | POA: Diagnosis present

## 2013-08-03 DIAGNOSIS — I5032 Chronic diastolic (congestive) heart failure: Secondary | ICD-10-CM | POA: Diagnosis present

## 2013-08-03 DIAGNOSIS — G894 Chronic pain syndrome: Secondary | ICD-10-CM

## 2013-08-03 DIAGNOSIS — Z79899 Other long term (current) drug therapy: Secondary | ICD-10-CM

## 2013-08-03 DIAGNOSIS — J9600 Acute respiratory failure, unspecified whether with hypoxia or hypercapnia: Secondary | ICD-10-CM

## 2013-08-03 DIAGNOSIS — Z823 Family history of stroke: Secondary | ICD-10-CM

## 2013-08-03 DIAGNOSIS — E039 Hypothyroidism, unspecified: Secondary | ICD-10-CM

## 2013-08-03 DIAGNOSIS — N184 Chronic kidney disease, stage 4 (severe): Secondary | ICD-10-CM | POA: Diagnosis present

## 2013-08-03 DIAGNOSIS — R739 Hyperglycemia, unspecified: Secondary | ICD-10-CM

## 2013-08-03 DIAGNOSIS — Z86711 Personal history of pulmonary embolism: Secondary | ICD-10-CM

## 2013-08-03 DIAGNOSIS — I4891 Unspecified atrial fibrillation: Secondary | ICD-10-CM | POA: Diagnosis present

## 2013-08-03 DIAGNOSIS — Z7901 Long term (current) use of anticoagulants: Secondary | ICD-10-CM

## 2013-08-03 DIAGNOSIS — R079 Chest pain, unspecified: Secondary | ICD-10-CM

## 2013-08-03 DIAGNOSIS — M48061 Spinal stenosis, lumbar region without neurogenic claudication: Secondary | ICD-10-CM

## 2013-08-03 DIAGNOSIS — N179 Acute kidney failure, unspecified: Secondary | ICD-10-CM | POA: Diagnosis present

## 2013-08-03 DIAGNOSIS — I129 Hypertensive chronic kidney disease with stage 1 through stage 4 chronic kidney disease, or unspecified chronic kidney disease: Secondary | ICD-10-CM | POA: Diagnosis present

## 2013-08-03 DIAGNOSIS — I480 Paroxysmal atrial fibrillation: Secondary | ICD-10-CM | POA: Diagnosis present

## 2013-08-03 DIAGNOSIS — Z950 Presence of cardiac pacemaker: Secondary | ICD-10-CM

## 2013-08-03 DIAGNOSIS — Z96649 Presence of unspecified artificial hip joint: Secondary | ICD-10-CM

## 2013-08-03 DIAGNOSIS — F411 Generalized anxiety disorder: Secondary | ICD-10-CM | POA: Diagnosis present

## 2013-08-03 DIAGNOSIS — Z66 Do not resuscitate: Secondary | ICD-10-CM | POA: Diagnosis present

## 2013-08-03 DIAGNOSIS — E7800 Pure hypercholesterolemia, unspecified: Secondary | ICD-10-CM | POA: Diagnosis present

## 2013-08-03 DIAGNOSIS — J441 Chronic obstructive pulmonary disease with (acute) exacerbation: Principal | ICD-10-CM | POA: Diagnosis present

## 2013-08-03 DIAGNOSIS — I10 Essential (primary) hypertension: Secondary | ICD-10-CM

## 2013-08-03 DIAGNOSIS — R55 Syncope and collapse: Secondary | ICD-10-CM

## 2013-08-03 DIAGNOSIS — I2510 Atherosclerotic heart disease of native coronary artery without angina pectoris: Secondary | ICD-10-CM | POA: Diagnosis present

## 2013-08-03 DIAGNOSIS — Z8249 Family history of ischemic heart disease and other diseases of the circulatory system: Secondary | ICD-10-CM

## 2013-08-03 HISTORY — DX: Chronic obstructive pulmonary disease, unspecified: J44.9

## 2013-08-03 LAB — PROTIME-INR
INR: 1.8 — ABNORMAL HIGH (ref 0.00–1.49)
Prothrombin Time: 20.4 seconds — ABNORMAL HIGH (ref 11.6–15.2)

## 2013-08-03 LAB — CBC WITH DIFFERENTIAL/PLATELET
Eosinophils Relative: 1 % (ref 0–5)
Hemoglobin: 11 g/dL — ABNORMAL LOW (ref 12.0–15.0)
Lymphocytes Relative: 12 % (ref 12–46)
Lymphs Abs: 0.8 10*3/uL (ref 0.7–4.0)
MCV: 75.4 fL — ABNORMAL LOW (ref 78.0–100.0)
Monocytes Relative: 13 % — ABNORMAL HIGH (ref 3–12)
Neutrophils Relative %: 73 % (ref 43–77)
Platelets: 219 10*3/uL (ref 150–400)
RBC: 4.8 MIL/uL (ref 3.87–5.11)
WBC: 7 10*3/uL (ref 4.0–10.5)

## 2013-08-03 LAB — COMPREHENSIVE METABOLIC PANEL
ALT: 11 U/L (ref 0–35)
Alkaline Phosphatase: 67 U/L (ref 39–117)
BUN: 25 mg/dL — ABNORMAL HIGH (ref 6–23)
CO2: 26 mEq/L (ref 19–32)
Calcium: 10.5 mg/dL (ref 8.4–10.5)
Glucose, Bld: 120 mg/dL — ABNORMAL HIGH (ref 70–99)
Potassium: 3.9 mEq/L (ref 3.5–5.1)
Sodium: 142 mEq/L (ref 135–145)

## 2013-08-03 LAB — MRSA PCR SCREENING: MRSA by PCR: NEGATIVE

## 2013-08-03 LAB — PRO B NATRIURETIC PEPTIDE: Pro B Natriuretic peptide (BNP): 1912 pg/mL — ABNORMAL HIGH (ref 0–450)

## 2013-08-03 MED ORDER — DORZOLAMIDE HCL 2 % OP SOLN
1.0000 [drp] | Freq: Two times a day (BID) | OPHTHALMIC | Status: DC
  Administered 2013-08-03 – 2013-08-10 (×14): 1 [drp] via OPHTHALMIC
  Filled 2013-08-03: qty 10

## 2013-08-03 MED ORDER — ALBUTEROL SULFATE (5 MG/ML) 0.5% IN NEBU: 5.0000 mg | INHALATION_SOLUTION | Freq: Once | RESPIRATORY_TRACT | Status: DC

## 2013-08-03 MED ORDER — METHYLPREDNISOLONE SODIUM SUCC 125 MG IJ SOLR
60.0000 mg | Freq: Four times a day (QID) | INTRAMUSCULAR | Status: DC
  Administered 2013-08-03 – 2013-08-08 (×21): 60 mg via INTRAVENOUS
  Filled 2013-08-03 (×22): qty 2

## 2013-08-03 MED ORDER — GABAPENTIN 300 MG PO CAPS
300.0000 mg | ORAL_CAPSULE | Freq: Three times a day (TID) | ORAL | Status: DC
  Administered 2013-08-03 – 2013-08-10 (×21): 300 mg via ORAL
  Filled 2013-08-03 (×7): qty 1
  Filled 2013-08-03: qty 3
  Filled 2013-08-03 (×13): qty 1

## 2013-08-03 MED ORDER — DOCUSATE SODIUM 100 MG PO CAPS
100.0000 mg | ORAL_CAPSULE | Freq: Two times a day (BID) | ORAL | Status: DC
  Administered 2013-08-03 – 2013-08-10 (×15): 100 mg via ORAL
  Filled 2013-08-03 (×15): qty 1

## 2013-08-03 MED ORDER — BRIMONIDINE TARTRATE 0.15 % OP SOLN
1.0000 [drp] | Freq: Three times a day (TID) | OPHTHALMIC | Status: DC
  Administered 2013-08-03 – 2013-08-10 (×21): 1 [drp] via OPHTHALMIC
  Filled 2013-08-03 (×2): qty 5

## 2013-08-03 MED ORDER — OXYCODONE-ACETAMINOPHEN 5-325 MG PO TABS
1.0000 | ORAL_TABLET | ORAL | Status: DC | PRN
  Administered 2013-08-03 – 2013-08-10 (×6): 1 via ORAL
  Filled 2013-08-03 (×7): qty 1

## 2013-08-03 MED ORDER — WARFARIN SODIUM 5 MG PO TABS
5.0000 mg | ORAL_TABLET | Freq: Once | ORAL | Status: AC
  Administered 2013-08-03: 5 mg via ORAL
  Filled 2013-08-03: qty 1

## 2013-08-03 MED ORDER — ACETAMINOPHEN 325 MG PO TABS
650.0000 mg | ORAL_TABLET | Freq: Four times a day (QID) | ORAL | Status: DC | PRN
  Administered 2013-08-06: 650 mg via ORAL
  Filled 2013-08-03: qty 2

## 2013-08-03 MED ORDER — GUAIFENESIN ER 600 MG PO TB12
600.0000 mg | ORAL_TABLET | Freq: Two times a day (BID) | ORAL | Status: DC
  Administered 2013-08-03 – 2013-08-10 (×15): 600 mg via ORAL
  Filled 2013-08-03 (×15): qty 1

## 2013-08-03 MED ORDER — AMIODARONE HCL 200 MG PO TABS
200.0000 mg | ORAL_TABLET | Freq: Every day | ORAL | Status: DC
  Administered 2013-08-03 – 2013-08-10 (×8): 200 mg via ORAL
  Filled 2013-08-03 (×8): qty 1

## 2013-08-03 MED ORDER — SIMVASTATIN 20 MG PO TABS
20.0000 mg | ORAL_TABLET | Freq: Every day | ORAL | Status: DC
  Administered 2013-08-03 – 2013-08-09 (×7): 20 mg via ORAL
  Filled 2013-08-03 (×7): qty 1

## 2013-08-03 MED ORDER — WARFARIN SODIUM 2 MG PO TABS: 2.0000 mg | ORAL_TABLET | Freq: Every day | ORAL | Status: DC

## 2013-08-03 MED ORDER — POLYETHYLENE GLYCOL 3350 17 G PO PACK
17.0000 g | PACK | Freq: Every day | ORAL | Status: DC
Start: 2013-08-03 — End: 2013-08-10
  Administered 2013-08-04 – 2013-08-06 (×3): 17 g via ORAL
  Filled 2013-08-03 (×4): qty 1

## 2013-08-03 MED ORDER — IPRATROPIUM BROMIDE 0.02 % IN SOLN
0.5000 mg | Freq: Once | RESPIRATORY_TRACT | Status: AC
  Administered 2013-08-03: 0.5 mg via RESPIRATORY_TRACT
  Filled 2013-08-03: qty 2.5

## 2013-08-03 MED ORDER — HYDROXYZINE HCL 25 MG PO TABS
25.0000 mg | ORAL_TABLET | Freq: Every day | ORAL | Status: DC | PRN
  Administered 2013-08-03 – 2013-08-07 (×2): 25 mg via ORAL
  Filled 2013-08-03 (×2): qty 1

## 2013-08-03 MED ORDER — TRIMETHOPRIM 100 MG PO TABS
100.0000 mg | ORAL_TABLET | Freq: Every day | ORAL | Status: DC
  Administered 2013-08-03 – 2013-08-09 (×7): 100 mg via ORAL
  Filled 2013-08-03 (×9): qty 1

## 2013-08-03 MED ORDER — IPRATROPIUM BROMIDE 0.02 % IN SOLN: 0.5000 mg | Freq: Once | RESPIRATORY_TRACT | Status: DC

## 2013-08-03 MED ORDER — MORPHINE SULFATE 2 MG/ML IJ SOLN
2.0000 mg | INTRAMUSCULAR | Status: DC | PRN
  Administered 2013-08-10 (×2): 2 mg via INTRAVENOUS
  Filled 2013-08-03 (×2): qty 1

## 2013-08-03 MED ORDER — ALBUTEROL SULFATE (5 MG/ML) 0.5% IN NEBU
2.5000 mg | INHALATION_SOLUTION | RESPIRATORY_TRACT | Status: DC | PRN
  Filled 2013-08-03 (×2): qty 0.5

## 2013-08-03 MED ORDER — FUROSEMIDE 80 MG PO TABS
80.0000 mg | ORAL_TABLET | Freq: Two times a day (BID) | ORAL | Status: DC
  Administered 2013-08-03 – 2013-08-10 (×15): 80 mg via ORAL
  Filled 2013-08-03: qty 2
  Filled 2013-08-03: qty 1
  Filled 2013-08-03 (×2): qty 2
  Filled 2013-08-03: qty 1
  Filled 2013-08-03 (×2): qty 2
  Filled 2013-08-03: qty 1
  Filled 2013-08-03 (×3): qty 2
  Filled 2013-08-03: qty 1
  Filled 2013-08-03 (×2): qty 2
  Filled 2013-08-03: qty 1

## 2013-08-03 MED ORDER — ACETAMINOPHEN 650 MG RE SUPP: 650.0000 mg | Freq: Four times a day (QID) | RECTAL | Status: DC | PRN

## 2013-08-03 MED ORDER — WARFARIN - PHARMACIST DOSING INPATIENT
Status: DC
  Administered 2013-08-03 – 2013-08-09 (×2)

## 2013-08-03 MED ORDER — ONDANSETRON HCL 4 MG PO TABS: 4.0000 mg | ORAL_TABLET | Freq: Four times a day (QID) | ORAL | Status: DC | PRN

## 2013-08-03 MED ORDER — LEVOTHYROXINE SODIUM 100 MCG PO TABS
100.0000 ug | ORAL_TABLET | Freq: Every day | ORAL | Status: DC
  Administered 2013-08-03 – 2013-08-10 (×8): 100 ug via ORAL
  Filled 2013-08-03 (×8): qty 1

## 2013-08-03 MED ORDER — ALBUTEROL SULFATE (5 MG/ML) 0.5% IN NEBU
2.5000 mg | INHALATION_SOLUTION | RESPIRATORY_TRACT | Status: DC
  Administered 2013-08-03 – 2013-08-04 (×5): 2.5 mg via RESPIRATORY_TRACT
  Filled 2013-08-03 (×5): qty 0.5

## 2013-08-03 MED ORDER — IPRATROPIUM BROMIDE 0.02 % IN SOLN
0.5000 mg | RESPIRATORY_TRACT | Status: DC
  Administered 2013-08-03 – 2013-08-04 (×5): 0.5 mg via RESPIRATORY_TRACT
  Filled 2013-08-03 (×5): qty 2.5

## 2013-08-03 MED ORDER — METHYLPREDNISOLONE SODIUM SUCC 125 MG IJ SOLR
125.0000 mg | Freq: Once | INTRAMUSCULAR | Status: AC
  Administered 2013-08-03: 125 mg via INTRAVENOUS
  Filled 2013-08-03: qty 2

## 2013-08-03 MED ORDER — METOPROLOL TARTRATE 50 MG PO TABS
50.0000 mg | ORAL_TABLET | ORAL | Status: DC
  Administered 2013-08-04 – 2013-08-10 (×7): 50 mg via ORAL
  Filled 2013-08-03 (×8): qty 1

## 2013-08-03 MED ORDER — IPRATROPIUM BROMIDE 0.02 % IN SOLN
0.5000 mg | RESPIRATORY_TRACT | Status: DC | PRN
  Filled 2013-08-03: qty 2.5

## 2013-08-03 MED ORDER — ALBUTEROL SULFATE (5 MG/ML) 0.5% IN NEBU
10.0000 mg | INHALATION_SOLUTION | Freq: Once | RESPIRATORY_TRACT | Status: AC
  Administered 2013-08-03: 10 mg via RESPIRATORY_TRACT
  Filled 2013-08-03: qty 2

## 2013-08-03 MED ORDER — POTASSIUM CHLORIDE CRYS ER 20 MEQ PO TBCR
40.0000 meq | EXTENDED_RELEASE_TABLET | Freq: Every day | ORAL | Status: DC
  Administered 2013-08-03 – 2013-08-10 (×8): 40 meq via ORAL
  Filled 2013-08-03 (×8): qty 2

## 2013-08-03 MED ORDER — ONDANSETRON HCL 4 MG/2ML IJ SOLN: 4.0000 mg | Freq: Four times a day (QID) | INTRAMUSCULAR | Status: DC | PRN

## 2013-08-03 MED ORDER — LATANOPROST 0.005 % OP SOLN
1.0000 [drp] | Freq: Every day | OPHTHALMIC | Status: DC
  Administered 2013-08-03 – 2013-08-09 (×7): 1 [drp] via OPHTHALMIC
  Filled 2013-08-03: qty 2.5

## 2013-08-03 NOTE — ED Notes (Signed)
Pt provided with socks and an additional warm blanket.

## 2013-08-03 NOTE — H&P (Signed)
[facility] History and Physical  [name] UUV:[id number] DOB: [birth year] DOA: 08/03/2013  Referring physician: Emergency department PCP: Cassell Smiles., MD  Specialists:   Chief Complaint: SOB, wheezing  HPI: [name] is a 79 y.o. female  With a hx of cad, copd, hypothyroid, htn who presents with increasing SOB with wheezing since early this AM. In the ED, pt improved with IV steroids and breathing tx, but still with wheezing and decreased BS. Hospitalist consulted for admission  Review of Systems:   Per above, the remainder of the 10pt ros reviewed and are neg  Past Medical History  Diagnosis Date  . Hypertension   . High cholesterol   . Arthritis   . Hypothyroidism   . Arrhythmia     pacemaker  . Glaucoma   . Pulmonary embolism 06/04/11  . Coronary artery disease   . Atrial fibrillation   . 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 Anxiety   . COPD (chronic obstructive pulmonary disease)    Past Surgical History  Procedure Laterality Date  . Total hip arthroplasty      right side  . Pacemaker insertion    . Appendectomy    . Abdominal hysterectomy     Social History:  reports that she has never smoked. She has never used smokeless tobacco. She reports that she does not drink alcohol or use illicit drugs.  where does patient live--home, ALF, SNF? and with whom if at home?  Can patient participate in ADLs?  Allergies  Allergen Reactions  . Naproxen     Patient on coumadin  . Robaxin [Methocarbamol]     Causes patient to be confused    Family History  Problem Relation Age of Onset  . Heart attack Mother   . Cancer Father     stomach  . Stroke Sister   . Stroke Brother     (be sure to complete)  Prior to Admission medications   Medication Sig Start Date End Date Taking? Authorizing Provider  acetaminophen (TYLENOL) 325 MG tablet Take 650 mg by mouth every 6 (six) hours as needed for mild pain or fever.    Yes Historical Provider, MD   albuterol (PROVENTIL) (2.5 MG/3ML) 0.083% nebulizer solution Take 2.5 mg by nebulization every 6 (six) hours as needed. For shortness of breath   Yes Historical Provider, MD  amiodarone (PACERONE) 200 MG tablet Take 200 mg by mouth daily.  04/30/11  Yes Historical Provider, MD  bimatoprost (LUMIGAN) 0.01 % SOLN Apply 1 drop to eye at bedtime.   Yes Historical Provider, MD  brimonidine (ALPHAGAN P) 0.1 % SOLN Place 1 drop into both eyes 2 (two) times daily.   Yes Historical Provider, MD  Calcium Carbonate-Vitamin D (CALCIUM 600 + D PO) Take 1 tablet by mouth 2 (two) times daily.   Yes Historical Provider, MD  docusate sodium (COLACE) 100 MG capsule Take 100 mg by mouth 2 (two) times daily.   Yes Historical Provider, MD  dorzolamide (TRUSOPT) 2 % ophthalmic solution Place 1 drop into both eyes 2 (two) times daily.   Yes Historical Provider, MD  furosemide (LASIX) 80 MG tablet Take 1 tablet (80 mg total) by mouth 2 (two) times daily. 02/25/13 07/26/14 Yes Lesle Chris Black, NP  gabapentin (NEURONTIN) 300 MG capsule Take 1 capsule (300 mg total) by mouth 3 (three) times daily. 02/25/13 05/25/14 Yes Lesle Chris Black, NP  guaiFENesin (MUCINEX) 600 MG 12 hr tablet Take 1 tablet (600 mg total) by  mouth 2 (two) times daily. 01/21/13  Yes Erick Blinks, MD  hydrOXYzine (VISTARIL) 25 MG capsule Take 1 capsule by mouth daily as needed for anxiety.  05/21/13  Yes Historical Provider, MD  levothyroxine (SYNTHROID, LEVOTHROID) 100 MCG tablet Take 100 mcg by mouth daily.   Yes Historical Provider, MD  metoprolol (LOPRESSOR) 50 MG tablet Take 50 mg by mouth every morning.   Yes Historical Provider, MD  ondansetron (ZOFRAN) 4 MG tablet Take 4 mg by mouth every 6 (six) hours as needed. For nausea   Yes Historical Provider, MD  oxyCODONE-acetaminophen (PERCOCET) 5-325 MG per tablet Take 1 tablet by mouth every 4 (four) hours as needed for pain. 06/19/12  Yes Vida Roller, MD  polyethylene glycol powder (GLYCOLAX/MIRALAX) powder  Take 17 g by mouth daily.    Yes Historical Provider, MD  potassium chloride SA (K-DUR,KLOR-CON) 20 MEQ tablet Take 2 tablets (40 mEq total) by mouth daily. 09/12/12  Yes Nimish Normajean Glasgow, MD  pravastatin (PRAVACHOL) 20 MG tablet Take 1 tablet (20 mg total) by mouth daily. 04/17/13  Yes Mihai Croitoru, MD  trimethoprim (TRIMPEX) 100 MG tablet Take 100 mg by mouth at bedtime.   Yes Historical Provider, MD  warfarin (COUMADIN) 4 MG tablet Take 2-4 mg by mouth daily. Take 1/2 tablet (2mg ) by mouth daily, except on Mondays and Fridays - take 1 tablet (5mg ).   Yes Historical Provider, MD  Alum & Mag Hydroxide-Simeth (MAGIC MOUTHWASH) SOLN Take 10 mLs by mouth 4 (four) times daily as needed.    Historical Provider, MD   Physical Exam: Filed Vitals:   08/03/13 1049 08/03/13 1156 08/03/13 1201 08/03/13 1350  BP:   143/61 117/69  Pulse:  75 80 74  Temp:      Resp:   24 18  SpO2: 94%  100% 96%     General:  Awake, in nad  Eyes: PERRL B  ENT: membranes moist, dentition fair  Neck: trachea midline, neck supple  Cardiovascular: regular, s1, s2  Respiratory: increased resp effort, decreased BS throughout, no wheezing (s/p breathing tx)  Abdomen: soft, nondistended  Skin: normal skin turgor, no abnormal skin lesions seen  Musculoskeletal: perfused, no clubbing  Psychiatric: mood/affect normal // no auditory/visual hallucinations  Neurologic: cn2-12 grossly intact, strength/sensation intact  Labs on Admission:  Basic Metabolic Panel:  Recent Labs Lab 08/03/13 1042  NA 142  K 3.9  CL 104  CO2 26  GLUCOSE 120*  BUN 25*  CREATININE 1.63*  CALCIUM 10.5   Liver Function Tests:  Recent Labs Lab 08/03/13 1042  AST 19  ALT 11  ALKPHOS 67  BILITOT 0.4  PROT 7.4  ALBUMIN 3.7   No results found for this basename: LIPASE, AMYLASE,  in the last 168 hours No results found for this basename: AMMONIA,  in the last 168 hours CBC:  Recent Labs Lab 08/03/13 1042  WBC 7.0   NEUTROABS 5.1  HGB 11.0*  HCT 36.2  MCV 75.4*  PLT 219   Cardiac Enzymes:  Recent Labs Lab 08/03/13 1042  TROPONINI <0.30    BNP (last 3 results)  Recent Labs  01/16/13 1745 02/23/13 1603 08/03/13 1042  PROBNP 2857.0* 1586.0* 1912.0*   CBG: No results found for this basename: GLUCAP,  in the last 168 hours  Radiological Exams on Admission: Dg Chest Portable 1 View  08/03/2013   CLINICAL DATA:  Shortness of breath and wheezing.  EXAM: PORTABLE CHEST - 1 VIEW  COMPARISON:  02/23/2013  FINDINGS: There  is stable cardiomegaly and appearance of a dual-chamber pacemaker. Lungs show more prominent interstitial thickening and bronchial thickening compared to multiple prior exams suggestive of a component of interstitial edema/pneumonitis or bronchitis superimposed on chronic disease. No overt pulmonary edema or significant pleural fluid is identified. There is stable parenchymal scarring in the left lung.  IMPRESSION: Suggestion of interstitial/bronchial process superimposed on chronic disease. This may be related to interstitial edema, pneumonitis or bronchitis.   Electronically Signed   By: Irish Lack M.D.   On: 08/03/2013 11:00    Assessment/Plan Principal Problem:   COPD exacerbation Active Problems:   PAF (paroxysmal atrial fibrillation)   HTN (hypertension)   Hypothyroidism   1. COPD exacerbation 1. Admit to med floor 2. Cont on scheduled nebs and IV steroids 3. Cont O2 as needed 2. Afib 1. On therapeutic coumadin 2. Consult pharmacy for coumadin mgt 3. Rate controlled 3. HTN 1. Controlled 2. Cont meds 4. Hypothyroid 1. Cont med 5. DVT prophylaxis 1. Therapeutic coumadin  Code Status: DNR - Pt states "I don't want to be brought back"(must indicate code status--if unknown or must be presumed, indicate so) Family Communication: Pt in room (indicate person spoken with, if applicable, with phone number if by telephone) Disposition Plan: Pending (indicate  anticipated LOS)  Time spent:  Iyani Dresner K [facility] Pager [phone]  If 7PM-7AM, please contact night-coverage [email or web address] Password [email or web address] 08/03/2013, 1:58 PM

## 2013-08-03 NOTE — ED Notes (Signed)
Pt c/o sob and wheezing since yesterday. Pt resident of [facility]. Received breathing tx pta. Audible wheezing noted.

## 2013-08-03 NOTE — ED Notes (Signed)
Audible wheezing remains, breathing tx will soon be completed.

## 2013-08-03 NOTE — ED Notes (Signed)
RT paged re: breathing tx is complete.

## 2013-08-03 NOTE — ED Provider Notes (Signed)
CSN: [id number]     Arrival date & time 08/03/13  4098 History  This chart was scribed for Benny Lennert, MD by Bennett Scrape, ED Scribe. This patient was seen in room APA01/APA01 and the patient's care was started at 11:20 AM.   Chief Complaint  Patient presents with  . Shortness of Breath    Patient is a 79 y.o. female presenting with shortness of breath. The history is provided by the patient. No language interpreter was used.  Shortness of Breath Onset quality:  Gradual Duration:  7 hours Timing:  Constant Progression:  Worsening Associated symptoms: wheezing   Associated symptoms: no abdominal pain, no chest pain, no cough, no headaches and no rash     HPI Comments: [name] is a 79 y.o. female with a h/o COPD who presents to the Emergency Department from [facility] complaining of gradual onset, gradually worsening, constant SOB with associated wheezing that started around 3:30 AM this morning. She tried a breathing treatment PTA with no improvement. She denies being on home O2. She denies any other symptoms currently.   Past Medical History  Diagnosis Date  . Hypertension   . High cholesterol   . Arthritis   . Hypothyroidism   . Arrhythmia     pacemaker  . Glaucoma   . Pulmonary embolism 06/04/11  . Coronary artery disease   . Atrial fibrillation   . 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 Anxiety   . COPD (chronic obstructive pulmonary disease)    Past Surgical History  Procedure Laterality Date  . Total hip arthroplasty      right side  . Pacemaker insertion    . Appendectomy    . Abdominal hysterectomy     Family History  Problem Relation Age of Onset  . Heart attack Mother   . Cancer Father     stomach  . Stroke Sister   . Stroke Brother    History  Substance Use Topics  . Smoking status: Never Smoker   . Smokeless tobacco: Never Used  . Alcohol Use: No   No OB history provided.  Review of Systems  Constitutional: Negative for  appetite change and fatigue.  HENT: Negative for congestion, ear discharge and sinus pressure.   Eyes: Negative for discharge.  Respiratory: Positive for shortness of breath and wheezing. Negative for cough.   Cardiovascular: Negative for chest pain.  Gastrointestinal: Negative for abdominal pain and diarrhea.  Genitourinary: Negative for frequency and hematuria.  Musculoskeletal: Negative for back pain.  Skin: Negative for rash.  Neurological: Negative for seizures and headaches.  Psychiatric/Behavioral: Negative for hallucinations.    Allergies  Naproxen and Robaxin  Home Medications   Current Outpatient Rx  Name  Route  Sig  Dispense  Refill  . acetaminophen (TYLENOL) 325 MG tablet   Oral   Take 650 mg by mouth every 6 (six) hours as needed for mild pain or fever.          Marland Kitchen albuterol (PROVENTIL) (2.5 MG/3ML) 0.083% nebulizer solution   Nebulization   Take 2.5 mg by nebulization every 6 (six) hours as needed. For shortness of breath         . amiodarone (PACERONE) 200 MG tablet   Oral   Take 200 mg by mouth daily.          . bimatoprost (LUMIGAN) 0.01 % SOLN   Ophthalmic   Apply 1 drop to eye at bedtime.         Marland Kitchen  brimonidine (ALPHAGAN P) 0.1 % SOLN   Both Eyes   Place 1 drop into both eyes 2 (two) times daily.         . Calcium Carbonate-Vitamin D (CALCIUM 600 + D PO)   Oral   Take 1 tablet by mouth 2 (two) times daily.         Marland Kitchen docusate sodium (COLACE) 100 MG capsule   Oral   Take 100 mg by mouth 2 (two) times daily.         . dorzolamide (TRUSOPT) 2 % ophthalmic solution   Both Eyes   Place 1 drop into both eyes 2 (two) times daily.         . furosemide (LASIX) 80 MG tablet   Oral   Take 1 tablet (80 mg total) by mouth 2 (two) times daily.   60 tablet   0   . gabapentin (NEURONTIN) 300 MG capsule   Oral   Take 1 capsule (300 mg total) by mouth 3 (three) times daily.   30 capsule   0   . guaiFENesin (MUCINEX) 600 MG 12 hr tablet    Oral   Take 1 tablet (600 mg total) by mouth 2 (two) times daily.   14 tablet   0   . hydrOXYzine (VISTARIL) 25 MG capsule   Oral   Take 1 capsule by mouth daily as needed for anxiety.          Marland Kitchen levothyroxine (SYNTHROID, LEVOTHROID) 100 MCG tablet   Oral   Take 100 mcg by mouth daily.         . metoprolol (LOPRESSOR) 50 MG tablet   Oral   Take 50 mg by mouth every morning.         . ondansetron (ZOFRAN) 4 MG tablet   Oral   Take 4 mg by mouth every 6 (six) hours as needed. For nausea         . oxyCODONE-acetaminophen (PERCOCET) 5-325 MG per tablet   Oral   Take 1 tablet by mouth every 4 (four) hours as needed for pain.   20 tablet   0   . polyethylene glycol powder (GLYCOLAX/MIRALAX) powder   Oral   Take 17 g by mouth daily.          . potassium chloride SA (K-DUR,KLOR-CON) 20 MEQ tablet   Oral   Take 2 tablets (40 mEq total) by mouth daily.         . pravastatin (PRAVACHOL) 20 MG tablet   Oral   Take 1 tablet (20 mg total) by mouth daily.   30 tablet   11   . trimethoprim (TRIMPEX) 100 MG tablet   Oral   Take 100 mg by mouth at bedtime.         Marland Kitchen warfarin (COUMADIN) 4 MG tablet   Oral   Take 2-4 mg by mouth daily. Take 1/2 tablet (2mg ) by mouth daily, except on Mondays and Fridays - take 1 tablet (5mg ).         . Alum & Mag Hydroxide-Simeth (MAGIC MOUTHWASH) SOLN   Oral   Take 10 mLs by mouth 4 (four) times daily as needed.          Triage Vitals: BP 154/85  Pulse 82  Temp(Src) 97.6 F (36.4 C)  Resp 34  SpO2 94%  Physical Exam  Nursing note and vitals reviewed. Constitutional: She is oriented to person, place, and time. She appears well-developed and well-nourished.  HENT:  Head:  Normocephalic and atraumatic.  Eyes: Conjunctivae and EOM are normal. No scleral icterus.  Neck: Neck supple. No thyromegaly present.  Cardiovascular: Normal rate and regular rhythm.  Exam reveals no gallop and no friction rub.   No murmur  heard. Pulmonary/Chest: Effort normal. No stridor. She has wheezes (moderate expiratory wheezing). She has no rales. She exhibits no tenderness.  Abdominal: She exhibits no distension. There is no tenderness. There is no rebound.  Musculoskeletal: Normal range of motion. She exhibits no edema.  Lymphadenopathy:    She has no cervical adenopathy.  Neurological: She is alert and oriented to person, place, and time. She exhibits normal muscle tone. Coordination normal.  Skin: Skin is warm and dry. No rash noted. No erythema.  Psychiatric: She has a normal mood and affect. Her behavior is normal.    ED Course  Procedures (including critical care time)  Medications  albuterol (PROVENTIL) (5 MG/ML) 0.5% nebulizer solution 5 mg (not administered)  ipratropium (ATROVENT) nebulizer solution 0.5 mg (not administered)  albuterol (PROVENTIL) (5 MG/ML) 0.5% nebulizer solution 10 mg (10 mg Nebulization Given 08/03/13 1043)  ipratropium (ATROVENT) nebulizer solution 0.5 mg (0.5 mg Nebulization Given 08/03/13 1043)  methylPREDNISolone sodium succinate (SOLU-MEDROL) 125 mg/2 mL injection 125 mg (125 mg Intravenous Given 08/03/13 1048)    DIAGNOSTIC STUDIES: Oxygen Saturation is 94% on RA, adequate by my interpretation.    COORDINATION OF CARE: 10:32 AM-Discussed treatment plan which includes CXR, CBC panel, CMP and breathing treatment with pt at bedside and pt agreed to plan.   12:19 PM-Pt rechecked and is still mildly wheezing. She is c/o fatigue and is requesting admission.   Labs Review Labs Reviewed  PRO B NATRIURETIC PEPTIDE - Abnormal; Notable for the following:    Pro B Natriuretic peptide (BNP) 1912.0 (*)    All other components within normal limits  CBC WITH DIFFERENTIAL - Abnormal; Notable for the following:    Hemoglobin 11.0 (*)    MCV 75.4 (*)    MCH 22.9 (*)    RDW 19.3 (*)    Monocytes Relative 13 (*)    All other components within normal limits  COMPREHENSIVE METABOLIC PANEL -  Abnormal; Notable for the following:    Glucose, Bld 120 (*)    BUN 25 (*)    Creatinine, Ser 1.63 (*)    GFR calc non Af Amer 28 (*)    GFR calc Af Amer 32 (*)    All other components within normal limits  TROPONIN I   Imaging Review Dg Chest Portable 1 View  08/03/2013   CLINICAL DATA:  Shortness of breath and wheezing.  EXAM: PORTABLE CHEST - 1 VIEW  COMPARISON:  02/23/2013  FINDINGS: There is stable cardiomegaly and appearance of a dual-chamber pacemaker. Lungs show more prominent interstitial thickening and bronchial thickening compared to multiple prior exams suggestive of a component of interstitial edema/pneumonitis or bronchitis superimposed on chronic disease. No overt pulmonary edema or significant pleural fluid is identified. There is stable parenchymal scarring in the left lung.  IMPRESSION: Suggestion of interstitial/bronchial process superimposed on chronic disease. This may be related to interstitial edema, pneumonitis or bronchitis.   Electronically Signed   By: Irish Lack M.D.   On: 08/03/2013 11:00      MDM  No diagnosis found.   The chart was scribed for me under my direct supervision.  I personally performed the history, physical, and medical decision making and all procedures in the evaluation of this patient.Jomarie Longs  Purnell Shoemaker, MD 08/03/13 1331

## 2013-08-03 NOTE — Progress Notes (Addendum)
ANTICOAGULATION CONSULT NOTE - Initial Consult  Pharmacy Consult for Coumadin (chronic Rx PTA) Indication: h/o PE & atrial fibrillation  Allergies  Allergen Reactions  . Naproxen     Patient on coumadin  . Robaxin [Methocarbamol]     Causes patient to be confused    Patient Measurements: Height: 5\' 3"  (160 cm) Weight: 151 lb (68.493 kg) IBW/kg (Calculated) : 52.4  Vital Signs: Temp: 97.6 F (36.4 C) (12/01 1435) Temp src: Oral (12/01 1435) BP: 121/56 mmHg (12/01 1435) Pulse Rate: 75 (12/01 1435)  Labs:  Recent Labs  08/03/13 1042 08/03/13 1442  HGB 11.0*  --   HCT 36.2  --   PLT 219  --   LABPROT  --  20.4*  INR  --  1.80*  CREATININE 1.63*  --   TROPONINI <0.30  --     Estimated Creatinine Clearance: 23.8 ml/min (by C-G formula based on Cr of 1.63).  Medical History: Past Medical History  Diagnosis Date  . Hypertension   . High cholesterol   . Arthritis   . Hypothyroidism   . Arrhythmia     pacemaker  . Glaucoma   . Pulmonary embolism 06/04/11  . Coronary artery disease   . Atrial fibrillation   . 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 Anxiety   . COPD (chronic obstructive pulmonary disease)    Medications:  Prescriptions prior to admission  Medication Sig Dispense Refill  . acetaminophen (TYLENOL) 325 MG tablet Take 650 mg by mouth every 6 (six) hours as needed for mild pain or fever.       Marland Kitchen albuterol (PROVENTIL) (2.5 MG/3ML) 0.083% nebulizer solution Take 2.5 mg by nebulization every 6 (six) hours as needed. For shortness of breath      . amiodarone (PACERONE) 200 MG tablet Take 200 mg by mouth daily.       . bimatoprost (LUMIGAN) 0.01 % SOLN Apply 1 drop to eye at bedtime.      . brimonidine (ALPHAGAN P) 0.1 % SOLN Place 1 drop into both eyes 2 (two) times daily.      . Calcium Carbonate-Vitamin D (CALCIUM 600 + D PO) Take 1 tablet by mouth 2 (two) times daily.      Marland Kitchen docusate sodium (COLACE) 100 MG capsule Take 100 mg by mouth 2 (two)  times daily.      . dorzolamide (TRUSOPT) 2 % ophthalmic solution Place 1 drop into both eyes 2 (two) times daily.      . furosemide (LASIX) 80 MG tablet Take 1 tablet (80 mg total) by mouth 2 (two) times daily.  60 tablet  0  . gabapentin (NEURONTIN) 300 MG capsule Take 1 capsule (300 mg total) by mouth 3 (three) times daily.  30 capsule  0  . guaiFENesin (MUCINEX) 600 MG 12 hr tablet Take 1 tablet (600 mg total) by mouth 2 (two) times daily.  14 tablet  0  . hydrOXYzine (VISTARIL) 25 MG capsule Take 1 capsule by mouth daily as needed for anxiety.       Marland Kitchen levothyroxine (SYNTHROID, LEVOTHROID) 100 MCG tablet Take 100 mcg by mouth daily.      . metoprolol (LOPRESSOR) 50 MG tablet Take 50 mg by mouth every morning.      . ondansetron (ZOFRAN) 4 MG tablet Take 4 mg by mouth every 6 (six) hours as needed. For nausea      . oxyCODONE-acetaminophen (PERCOCET) 5-325 MG per tablet Take 1 tablet by mouth every 4 (four)  hours as needed for pain.  20 tablet  0  . polyethylene glycol powder (GLYCOLAX/MIRALAX) powder Take 17 g by mouth daily.       . potassium chloride SA (K-DUR,KLOR-CON) 20 MEQ tablet Take 2 tablets (40 mEq total) by mouth daily.      . pravastatin (PRAVACHOL) 20 MG tablet Take 1 tablet (20 mg total) by mouth daily.  30 tablet  11  . trimethoprim (TRIMPEX) 100 MG tablet Take 100 mg by mouth at bedtime.      Marland Kitchen warfarin (COUMADIN) 4 MG tablet Take 2-4 mg by mouth daily. Take 1/2 tablet (2mg ) by mouth daily, except on Mondays and Fridays - take 1 tablet (5mg ).      . Alum & Mag Hydroxide-Simeth (MAGIC MOUTHWASH) SOLN Take 10 mLs by mouth 4 (four) times daily as needed.        Assessment: 79yo female admitted for worsening SOB.  Pt on Coumadin PTA for h/o PE.  Home dose is listed above.  INR is sub-therapeutic on admission.  Goal of Therapy:  INR 2-3 Monitor platelets by anticoagulation protocol: Yes   Plan:  Coumadin 5mg  today x 1 INR daily Monitor CBC  Makyle Eslick A 08/03/2013,3:26  PM

## 2013-08-04 DIAGNOSIS — I4891 Unspecified atrial fibrillation: Secondary | ICD-10-CM

## 2013-08-04 DIAGNOSIS — I5032 Chronic diastolic (congestive) heart failure: Secondary | ICD-10-CM

## 2013-08-04 LAB — COMPREHENSIVE METABOLIC PANEL
ALT: 11 U/L (ref 0–35)
AST: 18 U/L (ref 0–37)
Albumin: 3.3 g/dL — ABNORMAL LOW (ref 3.5–5.2)
Alkaline Phosphatase: 62 U/L (ref 39–117)
BUN: 32 mg/dL — ABNORMAL HIGH (ref 6–23)
Calcium: 10.2 mg/dL (ref 8.4–10.5)
Potassium: 4.6 mEq/L (ref 3.5–5.1)
Sodium: 142 mEq/L (ref 135–145)
Total Protein: 6.9 g/dL (ref 6.0–8.3)

## 2013-08-04 LAB — CBC
HCT: 33.1 % — ABNORMAL LOW (ref 36.0–46.0)
Hemoglobin: 10.2 g/dL — ABNORMAL LOW (ref 12.0–15.0)
MCHC: 30.8 g/dL (ref 30.0–36.0)
MCV: 75.2 fL — ABNORMAL LOW (ref 78.0–100.0)
RBC: 4.4 MIL/uL (ref 3.87–5.11)
RDW: 19.6 % — ABNORMAL HIGH (ref 11.5–15.5)
WBC: 13 10*3/uL — ABNORMAL HIGH (ref 4.0–10.5)

## 2013-08-04 LAB — PROTIME-INR: INR: 1.96 — ABNORMAL HIGH (ref 0.00–1.49)

## 2013-08-04 MED ORDER — ALBUTEROL SULFATE (5 MG/ML) 0.5% IN NEBU: 2.5000 mg | INHALATION_SOLUTION | RESPIRATORY_TRACT | Status: DC | PRN

## 2013-08-04 MED ORDER — AZITHROMYCIN 250 MG PO TABS
500.0000 mg | ORAL_TABLET | Freq: Every day | ORAL | Status: DC
  Administered 2013-08-04 – 2013-08-09 (×6): 500 mg via ORAL
  Filled 2013-08-04 (×6): qty 2

## 2013-08-04 MED ORDER — IPRATROPIUM BROMIDE 0.02 % IN SOLN: 0.5000 mg | Freq: Three times a day (TID) | RESPIRATORY_TRACT | Status: DC

## 2013-08-04 MED ORDER — ALBUTEROL SULFATE (5 MG/ML) 0.5% IN NEBU
2.5000 mg | INHALATION_SOLUTION | RESPIRATORY_TRACT | Status: DC | PRN
Start: 2013-08-04 — End: 2013-08-08
  Administered 2013-08-06: 2.5 mg via RESPIRATORY_TRACT

## 2013-08-04 MED ORDER — ALBUTEROL SULFATE (5 MG/ML) 0.5% IN NEBU
2.5000 mg | INHALATION_SOLUTION | Freq: Four times a day (QID) | RESPIRATORY_TRACT | Status: DC
  Administered 2013-08-04 – 2013-08-10 (×22): 2.5 mg via RESPIRATORY_TRACT
  Filled 2013-08-04 (×25): qty 0.5

## 2013-08-04 MED ORDER — WARFARIN SODIUM 5 MG PO TABS
5.0000 mg | ORAL_TABLET | Freq: Once | ORAL | Status: AC
  Administered 2013-08-04: 5 mg via ORAL
  Filled 2013-08-04: qty 1

## 2013-08-04 MED ORDER — ALBUTEROL SULFATE (5 MG/ML) 0.5% IN NEBU: 2.5000 mg | INHALATION_SOLUTION | RESPIRATORY_TRACT | Status: DC

## 2013-08-04 MED ORDER — IPRATROPIUM BROMIDE 0.02 % IN SOLN
0.5000 mg | Freq: Four times a day (QID) | RESPIRATORY_TRACT | Status: DC
  Administered 2013-08-04 – 2013-08-10 (×22): 0.5 mg via RESPIRATORY_TRACT
  Filled 2013-08-04 (×24): qty 2.5

## 2013-08-04 MED ORDER — DEXTROSE 5 % IV SOLN
1.0000 g | INTRAVENOUS | Status: DC
  Administered 2013-08-04 – 2013-08-10 (×7): 1 g via INTRAVENOUS
  Filled 2013-08-04 (×8): qty 10

## 2013-08-04 MED ORDER — ALBUTEROL SULFATE (5 MG/ML) 0.5% IN NEBU: 2.5000 mg | INHALATION_SOLUTION | Freq: Three times a day (TID) | RESPIRATORY_TRACT | Status: DC

## 2013-08-04 NOTE — Progress Notes (Signed)
ANTICOAGULATION CONSULT NOTE   Pharmacy Consult for Coumadin (chronic Rx PTA) Indication: h/o PE & atrial fibrillation  Allergies  Allergen Reactions  . Naproxen     Patient on coumadin  . Robaxin [Methocarbamol]     Causes patient to be confused   Patient Measurements: Height: 5\' 3"  (160 cm) Weight: 151 lb (68.493 kg) IBW/kg (Calculated) : 52.4  Vital Signs: Temp: 97.8 F (36.6 C) (12/02 0431) Temp src: Oral (12/02 0431) BP: 114/64 mmHg (12/02 0431) Pulse Rate: 70 (12/02 0840)  Labs:  Recent Labs  08/03/13 1042 08/03/13 1442 08/04/13 0548  HGB 11.0*  --  10.2*  HCT 36.2  --  33.1*  PLT 219  --  210  LABPROT  --  20.4* 21.7*  INR  --  1.80* 1.96*  CREATININE 1.63*  --  1.62*  TROPONINI <0.30  --   --    Estimated Creatinine Clearance: 24 ml/min (by C-G formula based on Cr of 1.62).  Medical History: Past Medical History  Diagnosis Date  . Hypertension   . High cholesterol   . Arthritis   . Hypothyroidism   . Arrhythmia     pacemaker  . Glaucoma   . Pulmonary embolism 06/04/11  . Coronary artery disease   . Atrial fibrillation   . 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 Anxiety   . COPD (chronic obstructive pulmonary disease)    Medications:  Prescriptions prior to admission  Medication Sig Dispense Refill  . acetaminophen (TYLENOL) 325 MG tablet Take 650 mg by mouth every 6 (six) hours as needed for mild pain or fever.       Marland Kitchen albuterol (PROVENTIL) (2.5 MG/3ML) 0.083% nebulizer solution Take 2.5 mg by nebulization every 6 (six) hours as needed. For shortness of breath      . amiodarone (PACERONE) 200 MG tablet Take 200 mg by mouth daily.       . bimatoprost (LUMIGAN) 0.01 % SOLN Apply 1 drop to eye at bedtime.      . brimonidine (ALPHAGAN P) 0.1 % SOLN Place 1 drop into both eyes 2 (two) times daily.      . Calcium Carbonate-Vitamin D (CALCIUM 600 + D PO) Take 1 tablet by mouth 2 (two) times daily.      Marland Kitchen docusate sodium (COLACE) 100 MG capsule  Take 100 mg by mouth 2 (two) times daily.      . dorzolamide (TRUSOPT) 2 % ophthalmic solution Place 1 drop into both eyes 2 (two) times daily.      . furosemide (LASIX) 80 MG tablet Take 1 tablet (80 mg total) by mouth 2 (two) times daily.  60 tablet  0  . gabapentin (NEURONTIN) 300 MG capsule Take 1 capsule (300 mg total) by mouth 3 (three) times daily.  30 capsule  0  . guaiFENesin (MUCINEX) 600 MG 12 hr tablet Take 1 tablet (600 mg total) by mouth 2 (two) times daily.  14 tablet  0  . hydrOXYzine (VISTARIL) 25 MG capsule Take 1 capsule by mouth daily as needed for anxiety.       Marland Kitchen levothyroxine (SYNTHROID, LEVOTHROID) 100 MCG tablet Take 100 mcg by mouth daily.      . metoprolol (LOPRESSOR) 50 MG tablet Take 50 mg by mouth every morning.      . ondansetron (ZOFRAN) 4 MG tablet Take 4 mg by mouth every 6 (six) hours as needed. For nausea      . oxyCODONE-acetaminophen (PERCOCET) 5-325 MG per tablet Take  1 tablet by mouth every 4 (four) hours as needed for pain.  20 tablet  0  . polyethylene glycol powder (GLYCOLAX/MIRALAX) powder Take 17 g by mouth daily.       . potassium chloride SA (K-DUR,KLOR-CON) 20 MEQ tablet Take 2 tablets (40 mEq total) by mouth daily.      . pravastatin (PRAVACHOL) 20 MG tablet Take 1 tablet (20 mg total) by mouth daily.  30 tablet  11  . trimethoprim (TRIMPEX) 100 MG tablet Take 100 mg by mouth at bedtime.      Marland Kitchen warfarin (COUMADIN) 4 MG tablet Take 2-4 mg by mouth daily. Take 1/2 tablet (2mg ) by mouth daily, except on Mondays and Fridays - take 1 tablet (5mg ).      . Alum & Mag Hydroxide-Simeth (MAGIC MOUTHWASH) SOLN Take 10 mLs by mouth 4 (four) times daily as needed.       Assessment: 79yo female admitted for worsening SOB.  Pt on Coumadin PTA for h/o PE.  Home dose is listed above.  INR is sub-therapeutic on admission and today but is trending up.  Goal of Therapy:  INR 2-3 Monitor platelets by anticoagulation protocol: Yes   Plan:  Repeat Coumadin 5mg  today  x 1 INR daily Monitor CBC  Deddrick Saindon A 08/04/2013,11:51 AM

## 2013-08-04 NOTE — Progress Notes (Addendum)
[facility] PROGRESS NOTE  [name] ZOX:[id number] DOB: 1927-04-22 DOA: 08/03/2013 PCP: Cassell Smiles., MD  Assessment/Plan: Acute COPD exacerbation -Continue IV Solu-Medrol -Add ceftriaxone/azithromycin as quinolones will elevate her INR -Continue aerosolized albuterol and Atrovent--increase frequency to every 6 hrs with q2prn sob Chronic diastolic CHF -ProBNP is near the patient's baseline -Patient's weight is unchanged from when she was discharged on 01/21/2013 (151 lbs) -Dry weight 151-153 pounds -Continue home dose of furosemide -Daily weight -Jan 19, 2013--EF of 60-65% paroxysmal atrial fibrillation -Continue amiodarone -Rate controlled -Continue warfarin -Continue metoprolol tartrate CKD stage III-IV -Baseline creatinine 1.3-1.6 -Monitor renal function on furosemide Hypertension -Controlled -Continue current antihypertensive regimen  Family Communication:   Pt at beside Disposition Plan:   Home when medically stable   Antibiotics:  Levofloxacin 08/04/2013>>>    Procedures/Studies: Dg Chest Portable 1 View  08/03/2013   CLINICAL DATA:  Shortness of breath and wheezing.  EXAM: PORTABLE CHEST - 1 VIEW  COMPARISON:  02/23/2013  FINDINGS: There is stable cardiomegaly and appearance of a dual-chamber pacemaker. Lungs show more prominent interstitial thickening and bronchial thickening compared to multiple prior exams suggestive of a component of interstitial edema/pneumonitis or bronchitis superimposed on chronic disease. No overt pulmonary edema or significant pleural fluid is identified. There is stable parenchymal scarring in the left lung.  IMPRESSION: Suggestion of interstitial/bronchial process superimposed on chronic disease. This may be related to interstitial edema, pneumonitis or bronchitis.   Electronically Signed   By: Irish Lack M.D.   On: 08/03/2013 11:00         Subjective: Patient is breathing slightly better. Denies any fevers,  chills, chest discomfort, nausea, vomiting, diarrhea, abdominal pain, dysuria.  Objective: Filed Vitals:   08/04/13 0354 08/04/13 0431 08/04/13 0708 08/04/13 0840  BP:  114/64    Pulse:  70  70  Temp:  97.8 F (36.6 C)    TempSrc:  Oral    Resp:  18    Height:      Weight:      SpO2: 96% 95% 95% 96%    Intake/Output Summary (Last 24 hours) at 08/04/13 1238 Last data filed at 08/04/13 1045  Gross per 24 hour  Intake    480 ml  Output   1200 ml  Net   -720 ml   Weight change:  Exam:   General:  Pt is alert, follows commands appropriately, not in acute distress  HEENT: No icterus, No thrush,  [place]/AT  Cardiovascular: RRR, S1/S2, no rubs, no gallops  Respiratory: Bibasilar or wheezing. Good air movement. Scattered rhonchi.  Abdomen: Soft/+BS, non tender, non distended, no guarding  Extremities: Trace lower extremity edema, No lymphangitis, No petechiae, No rashes, no synovitis  Data Reviewed: Basic Metabolic Panel:  Recent Labs Lab 08/03/13 1042 08/04/13 0548  NA 142 142  K 3.9 4.6  CL 104 106  CO2 26 23  GLUCOSE 120* 196*  BUN 25* 32*  CREATININE 1.63* 1.62*  CALCIUM 10.5 10.2   Liver Function Tests:  Recent Labs Lab 08/03/13 1042 08/04/13 0548  AST 19 18  ALT 11 11  ALKPHOS 67 62  BILITOT 0.4 0.3  PROT 7.4 6.9  ALBUMIN 3.7 3.3*   No results found for this basename: LIPASE, AMYLASE,  in the last 168 hours No results found for this basename: AMMONIA,  in the last 168 hours CBC:  Recent Labs Lab 08/03/13 1042 08/04/13 0548  WBC 7.0 13.0*  NEUTROABS 5.1  --   HGB 11.0* 10.2*  HCT  36.2 33.1*  MCV 75.4* 75.2*  PLT 219 210   Cardiac Enzymes:  Recent Labs Lab 08/03/13 1042  TROPONINI <0.30   BNP: No components found with this basename: POCBNP,  CBG: No results found for this basename: GLUCAP,  in the last 168 hours  Recent Results (from the past 240 hour(s))  MRSA PCR SCREENING     Status: None   Collection Time    08/03/13  2:26  PM      Result Value Range Status   MRSA by PCR NEGATIVE  NEGATIVE Final   Comment:            The GeneXpert MRSA Assay (FDA     approved for NASAL specimens     only), is one component of a     comprehensive MRSA colonization     surveillance program. It is not     intended to diagnose MRSA     infection nor to guide or     monitor treatment for     MRSA infections.     Scheduled Meds: . albuterol  2.5 mg Nebulization TID   And  . ipratropium  0.5 mg Nebulization TID  . amiodarone  200 mg Oral Daily  . brimonidine  1 drop Both Eyes Q8H  . docusate sodium  100 mg Oral BID  . dorzolamide  1 drop Both Eyes BID  . furosemide  80 mg Oral BID  . gabapentin  300 mg Oral TID  . guaiFENesin  600 mg Oral BID  . latanoprost  1 drop Both Eyes QHS  . levothyroxine  100 mcg Oral QAC breakfast  . methylPREDNISolone (SOLU-MEDROL) injection  60 mg Intravenous Q6H  . metoprolol  50 mg Oral BH-q7a  . polyethylene glycol  17 g Oral Daily  . potassium chloride SA  40 mEq Oral Daily  . simvastatin  20 mg Oral q1800  . trimethoprim  100 mg Oral QHS  . warfarin  5 mg Oral Once  . Warfarin - Pharmacist Dosing Inpatient   Does not apply Q24H   Continuous Infusions:    Ann Groeneveld, DO  [facility] Pager [phone]  If 7PM-7AM, please contact night-coverage [email or web address] Password [email or web address] 08/04/2013, 12:38 PM   LOS: 1 day

## 2013-08-04 NOTE — Progress Notes (Signed)
Utilization Review Complete  

## 2013-08-05 DIAGNOSIS — N179 Acute kidney failure, unspecified: Secondary | ICD-10-CM

## 2013-08-05 LAB — BASIC METABOLIC PANEL
Calcium: 10.1 mg/dL (ref 8.4–10.5)
Creatinine, Ser: 1.55 mg/dL — ABNORMAL HIGH (ref 0.50–1.10)
GFR calc Af Amer: 34 mL/min — ABNORMAL LOW (ref >90)
GFR calc non Af Amer: 30 mL/min — ABNORMAL LOW (ref >90)
Potassium: 4.2 mEq/L (ref 3.5–5.1)
Sodium: 141 mEq/L (ref 135–145)

## 2013-08-05 LAB — PROTIME-INR
INR: 3.95 — ABNORMAL HIGH (ref 0.00–1.49)
Prothrombin Time: 37.1 seconds — ABNORMAL HIGH (ref 11.6–15.2)

## 2013-08-05 NOTE — Progress Notes (Signed)
[facility] PROGRESS NOTE  [name] ZOX:[id number] DOB: 01-14-1927 DOA: 08/03/2013 PCP: Cassell Smiles., MD  Assessment/Plan:  Acute COPD exacerbation -Continue IV Solu-Medrol at current dose given ongoing wheezing. -Continue Rocephin/azithromycin. -Roommate at ALF with current pneumonia. -Continue aerosolized albuterol and Atrovent--increase frequency to every 6 hrs with q2prn sob. -Would benefit from Spiriva on discharge.  Chronic diastolic CHF -ProBNP is near the patient's baseline -Patient's weight is unchanged from when she was discharged on 01/21/2013 (151 lbs) -Dry weight 151-153 pounds -Continue home dose of furosemide -Daily weight -Jan 19, 2013--EF of 60-65% -No clinical signs of volume overload at present.  Paroxysmal atrial fibrillation -Continue amiodarone -Rate controlled -Continue warfarin. INR is supra-therapeutic. Pharmacy dosing. -Continue metoprolol tartrate  CKD stage III-IV -Baseline creatinine 1.3-1.6 -Monitor renal function on furosemide -Creatinine at baseline at 1.55.  Hypertension -Controlled -Continue current antihypertensive regimen  Family Communication:   Patient and friend at bedside updated on plan of care. Disposition Plan:   Back to ALF once medically stable.   Antibiotics:  Rocephin 08/04/13-->  Azithro 08/04/13-->    Procedures/Studies: Dg Chest Portable 1 View  08/03/2013   CLINICAL DATA:  Shortness of breath and wheezing.  EXAM: PORTABLE CHEST - 1 VIEW  COMPARISON:  02/23/2013  FINDINGS: There is stable cardiomegaly and appearance of a dual-chamber pacemaker. Lungs show more prominent interstitial thickening and bronchial thickening compared to multiple prior exams suggestive of a component of interstitial edema/pneumonitis or bronchitis superimposed on chronic disease. No overt pulmonary edema or significant pleural fluid is identified. There is stable parenchymal scarring in the left lung.  IMPRESSION: Suggestion  of interstitial/bronchial process superimposed on chronic disease. This may be related to interstitial edema, pneumonitis or bronchitis.   Electronically Signed   By: Irish Lack M.D.   On: 08/03/2013 11:00         Subjective: Patient is breathing slightly better. Denies any fevers, chills, chest discomfort, nausea, vomiting, diarrhea, abdominal pain, dysuria. Wary about returning to ALF while roommate has PNA.  Objective: Filed Vitals:   08/04/13 1417 08/04/13 2023 08/04/13 2156 08/05/13 0435  BP:   149/75 153/78  Pulse:   83 75  Temp:   97.5 F (36.4 C) 97.7 F (36.5 C)  TempSrc:   Oral Oral  Resp:   20 20  Height:      Weight:      SpO2: 94% 94% 99% 96%    Intake/Output Summary (Last 24 hours) at 08/05/13 0925 Last data filed at 08/05/13 0436  Gross per 24 hour  Intake    240 ml  Output   1400 ml  Net  -1160 ml   Weight change:  Exam:   General:  Pt is alert, follows commands appropriately, not in acute distress  HEENT: No icterus, No thrush,  [place]/AT  Cardiovascular: RRR, S1/S2, no rubs, no gallops  Respiratory: Bilateral wheezing. Good air movement. Scattered rhonchi.  Abdomen: Soft/+BS, non tender, non distended, no guarding  Extremities: Trace lower extremity edema, No lymphangitis, No petechiae, No rashes, no synovitis  Data Reviewed: Basic Metabolic Panel:  Recent Labs Lab 08/03/13 1042 08/04/13 0548 08/05/13 0534  NA 142 142 141  K 3.9 4.6 4.2  CL 104 106 106  CO2 26 23 23   GLUCOSE 120* 196* 186*  BUN 25* 32* 38*  CREATININE 1.63* 1.62* 1.55*  CALCIUM 10.5 10.2 10.1   Liver Function Tests:  Recent Labs Lab 08/03/13 1042 08/04/13 0548  AST 19 18  ALT 11 11  ALKPHOS 67  62  BILITOT 0.4 0.3  PROT 7.4 6.9  ALBUMIN 3.7 3.3*   No results found for this basename: LIPASE, AMYLASE,  in the last 168 hours No results found for this basename: AMMONIA,  in the last 168 hours CBC:  Recent Labs Lab 08/03/13 1042 08/04/13 0548  WBC 7.0  13.0*  NEUTROABS 5.1  --   HGB 11.0* 10.2*  HCT 36.2 33.1*  MCV 75.4* 75.2*  PLT 219 210   Cardiac Enzymes:  Recent Labs Lab 08/03/13 1042  TROPONINI <0.30   BNP: No components found with this basename: POCBNP,  CBG: No results found for this basename: GLUCAP,  in the last 168 hours  Recent Results (from the past 240 hour(s))  MRSA PCR SCREENING     Status: None   Collection Time    08/03/13  2:26 PM      Result Value Range Status   MRSA by PCR NEGATIVE  NEGATIVE Final   Comment:            The GeneXpert MRSA Assay (FDA     approved for NASAL specimens     only), is one component of a     comprehensive MRSA colonization     surveillance program. It is not     intended to diagnose MRSA     infection nor to guide or     monitor treatment for     MRSA infections.     Scheduled Meds: . ipratropium  0.5 mg Nebulization Q6H   And  . albuterol  2.5 mg Nebulization Q6H  . amiodarone  200 mg Oral Daily  . azithromycin  500 mg Oral Daily  . brimonidine  1 drop Both Eyes Q8H  . cefTRIAXone (ROCEPHIN)  IV  1 g Intravenous Q24H  . docusate sodium  100 mg Oral BID  . dorzolamide  1 drop Both Eyes BID  . furosemide  80 mg Oral BID  . gabapentin  300 mg Oral TID  . guaiFENesin  600 mg Oral BID  . latanoprost  1 drop Both Eyes QHS  . levothyroxine  100 mcg Oral QAC breakfast  . methylPREDNISolone (SOLU-MEDROL) injection  60 mg Intravenous Q6H  . metoprolol  50 mg Oral BH-q7a  . polyethylene glycol  17 g Oral Daily  . potassium chloride SA  40 mEq Oral Daily  . simvastatin  20 mg Oral q1800  . trimethoprim  100 mg Oral QHS  . Warfarin - Pharmacist Dosing Inpatient   Does not apply Q24H   Continuous Infusions:   Time Spent: 35 minutes  Chaya Jan, MD  [facility] Pager [phone]  If 7PM-7AM, please contact night-coverage [email or web address] Password [email or web address] 08/05/2013, 9:25 AM   LOS: 2 days

## 2013-08-05 NOTE — Progress Notes (Signed)
ANTICOAGULATION CONSULT NOTE   Pharmacy Consult for Coumadin  Indication: h/o PE & atrial fibrillation  Allergies  Allergen Reactions  . Naproxen     Patient on coumadin  . Robaxin [Methocarbamol]     Causes patient to be confused   Patient Measurements: Height: 5\' 3"  (160 cm) Weight: 151 lb (68.493 kg) IBW/kg (Calculated) : 52.4  Vital Signs: Temp: 97.7 F (36.5 C) (12/03 0435) Temp src: Oral (12/03 0435) BP: 153/78 mmHg (12/03 0435) Pulse Rate: 75 (12/03 0435)  Labs:  Recent Labs  08/03/13 1042 08/03/13 1442 08/04/13 0548 08/05/13 0534  HGB 11.0*  --  10.2*  --   HCT 36.2  --  33.1*  --   PLT 219  --  210  --   LABPROT  --  20.4* 21.7* 37.1*  INR  --  1.80* 1.96* 3.95*  CREATININE 1.63*  --  1.62* 1.55*  TROPONINI <0.30  --   --   --    Estimated Creatinine Clearance: 25.1 ml/min (by C-G formula based on Cr of 1.55).  Medical History: Past Medical History  Diagnosis Date  . Hypertension   . High cholesterol   . Arthritis   . Hypothyroidism   . Arrhythmia     pacemaker  . Glaucoma   . Pulmonary embolism 06/04/11  . Coronary artery disease   . Atrial fibrillation   . 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 Anxiety   . COPD (chronic obstructive pulmonary disease)    Medications:  Prescriptions prior to admission  Medication Sig Dispense Refill  . acetaminophen (TYLENOL) 325 MG tablet Take 650 mg by mouth every 6 (six) hours as needed for mild pain or fever.       Marland Kitchen albuterol (PROVENTIL) (2.5 MG/3ML) 0.083% nebulizer solution Take 2.5 mg by nebulization every 6 (six) hours as needed. For shortness of breath      . amiodarone (PACERONE) 200 MG tablet Take 200 mg by mouth daily.       . bimatoprost (LUMIGAN) 0.01 % SOLN Apply 1 drop to eye at bedtime.      . brimonidine (ALPHAGAN P) 0.1 % SOLN Place 1 drop into both eyes 2 (two) times daily.      . Calcium Carbonate-Vitamin D (CALCIUM 600 + D PO) Take 1 tablet by mouth 2 (two) times daily.      Marland Kitchen  docusate sodium (COLACE) 100 MG capsule Take 100 mg by mouth 2 (two) times daily.      . dorzolamide (TRUSOPT) 2 % ophthalmic solution Place 1 drop into both eyes 2 (two) times daily.      . furosemide (LASIX) 80 MG tablet Take 1 tablet (80 mg total) by mouth 2 (two) times daily.  60 tablet  0  . gabapentin (NEURONTIN) 300 MG capsule Take 1 capsule (300 mg total) by mouth 3 (three) times daily.  30 capsule  0  . guaiFENesin (MUCINEX) 600 MG 12 hr tablet Take 1 tablet (600 mg total) by mouth 2 (two) times daily.  14 tablet  0  . hydrOXYzine (VISTARIL) 25 MG capsule Take 1 capsule by mouth daily as needed for anxiety.       Marland Kitchen levothyroxine (SYNTHROID, LEVOTHROID) 100 MCG tablet Take 100 mcg by mouth daily.      . metoprolol (LOPRESSOR) 50 MG tablet Take 50 mg by mouth every morning.      . ondansetron (ZOFRAN) 4 MG tablet Take 4 mg by mouth every 6 (six) hours as needed.  For nausea      . oxyCODONE-acetaminophen (PERCOCET) 5-325 MG per tablet Take 1 tablet by mouth every 4 (four) hours as needed for pain.  20 tablet  0  . polyethylene glycol powder (GLYCOLAX/MIRALAX) powder Take 17 g by mouth daily.       . potassium chloride SA (K-DUR,KLOR-CON) 20 MEQ tablet Take 2 tablets (40 mEq total) by mouth daily.      . pravastatin (PRAVACHOL) 20 MG tablet Take 1 tablet (20 mg total) by mouth daily.  30 tablet  11  . trimethoprim (TRIMPEX) 100 MG tablet Take 100 mg by mouth at bedtime.      Marland Kitchen warfarin (COUMADIN) 4 MG tablet Take 2-4 mg by mouth daily. Take 1/2 tablet (2mg ) by mouth daily, except on Mondays and Fridays - take 1 tablet (5mg ).      . Alum & Mag Hydroxide-Simeth (MAGIC MOUTHWASH) SOLN Take 10 mLs by mouth 4 (four) times daily as needed.       Assessment: 79yo female admitted for worsening SOB.  Pt on Coumadin PTA for h/o PE.  Home dose is listed above.  INR is sub-therapeutic on admission, but has trended up after 2 dose increases and is now supra-therapeutic.  Patient has also started on  Zithromax which can cause increased INR. No bleeding noted.     Goal of Therapy:  INR 2-3   Plan:  Hold Coumadin today  INR daily  Milos Milligan, [facility] 08/05/2013,10:26 AM

## 2013-08-05 NOTE — Progress Notes (Signed)
Initial visit for support. Also referred to discuss advance directives. Patient stated she had completed the forms and would ask her son to bring a copy to be placed in her chart here.  Discussed and supported faith issues.  Prayed with her.

## 2013-08-06 DIAGNOSIS — I509 Heart failure, unspecified: Secondary | ICD-10-CM

## 2013-08-06 DIAGNOSIS — R079 Chest pain, unspecified: Secondary | ICD-10-CM

## 2013-08-06 LAB — CBC
HCT: 38.5 % (ref 36.0–46.0)
Hemoglobin: 11.8 g/dL — ABNORMAL LOW (ref 12.0–15.0)
MCH: 23.2 pg — ABNORMAL LOW (ref 26.0–34.0)
MCHC: 30.6 g/dL (ref 30.0–36.0)
Platelets: 245 10*3/uL (ref 150–400)
RDW: 19.6 % — ABNORMAL HIGH (ref 11.5–15.5)

## 2013-08-06 LAB — BASIC METABOLIC PANEL
BUN: 49 mg/dL — ABNORMAL HIGH (ref 6–23)
Calcium: 10.3 mg/dL (ref 8.4–10.5)
Chloride: 102 mEq/L (ref 96–112)
Creatinine, Ser: 1.74 mg/dL — ABNORMAL HIGH (ref 0.50–1.10)
GFR calc Af Amer: 30 mL/min — ABNORMAL LOW (ref >90)
GFR calc non Af Amer: 26 mL/min — ABNORMAL LOW (ref >90)

## 2013-08-06 LAB — PROTIME-INR
INR: 3.96 — ABNORMAL HIGH (ref 0.00–1.49)
Prothrombin Time: 37.2 seconds — ABNORMAL HIGH (ref 11.6–15.2)

## 2013-08-06 MED ORDER — SODIUM CHLORIDE 0.9 % IJ SOLN
3.0000 mL | Freq: Two times a day (BID) | INTRAMUSCULAR | Status: DC
  Administered 2013-08-06 – 2013-08-10 (×8): 3 mL via INTRAVENOUS

## 2013-08-06 MED ORDER — SODIUM CHLORIDE 0.9 % IV SOLN: 250.0000 mL | INTRAVENOUS | Status: DC | PRN

## 2013-08-06 MED ORDER — SODIUM CHLORIDE 0.9 % IJ SOLN: 3.0000 mL | INTRAMUSCULAR | Status: DC | PRN

## 2013-08-06 NOTE — Progress Notes (Signed)
[facility] PROGRESS NOTE  [name] WUJ:[id number] DOB: Oct 11, 1926 DOA: 08/03/2013 PCP: Cassell Smiles., MD  Assessment/Plan:  Acute COPD exacerbation -Continue IV Solu-Medrol at current dose given ongoing wheezing. -Continue Rocephin/azithromycin. -Roommate at ALF with current pneumonia. -Continue aerosolized albuterol and Atrovent--increase frequency to every 6 hrs with q2prn sob. -Would benefit from Spiriva on discharge.  Chronic diastolic CHF -ProBNP is near the patient's baseline -Patient's weight is unchanged from when she was discharged on 01/21/2013 (151 lbs) -Dry weight 151-153 pounds -Continue home dose of furosemide -Daily weight -Jan 19, 2013--EF of 60-65% -No clinical signs of volume overload at present.  Paroxysmal atrial fibrillation -Continue amiodarone -Rate controlled -Continue warfarin. INR is supra-therapeutic. Pharmacy dosing. -Continue metoprolol tartrate  CKD stage III-IV -Baseline creatinine 1.3-1.6 -Monitor renal function on furosemide -Creatinine a little above baseline at 1.74. -Recheck in am. -May need to consider temporarily stopping lasix if Cr continues to increase.  Hypertension -Controlled -Continue current antihypertensive regimen  Family Communication:   Patient and friend at bedside updated on plan of care. Disposition Plan:   Back to ALF once medically stable.   Antibiotics:  Rocephin 08/04/13-->  Azithro 08/04/13-->    Procedures/Studies: Dg Chest Portable 1 View  08/03/2013   CLINICAL DATA:  Shortness of breath and wheezing.  EXAM: PORTABLE CHEST - 1 VIEW  COMPARISON:  02/23/2013  FINDINGS: There is stable cardiomegaly and appearance of a dual-chamber pacemaker. Lungs show more prominent interstitial thickening and bronchial thickening compared to multiple prior exams suggestive of a component of interstitial edema/pneumonitis or bronchitis superimposed on chronic disease. No overt pulmonary edema or significant  pleural fluid is identified. There is stable parenchymal scarring in the left lung.  IMPRESSION: Suggestion of interstitial/bronchial process superimposed on chronic disease. This may be related to interstitial edema, pneumonitis or bronchitis.   Electronically Signed   By: Irish Lack M.D.   On: 08/03/2013 11:00         Subjective: Patient is breathing slightly better. Denies any fevers, chills, chest discomfort, nausea, vomiting, diarrhea, abdominal pain, dysuria. Wary about returning to ALF while roommate has PNA.  Objective: Filed Vitals:   08/06/13 0138 08/06/13 0516 08/06/13 0529 08/06/13 0752  BP:   146/56   Pulse:   75   Temp:   97.4 F (36.3 C)   TempSrc:   Oral   Resp:   20   Height:      Weight:      SpO2: 95% 95% 98% 98%    Intake/Output Summary (Last 24 hours) at 08/06/13 1219 Last data filed at 08/06/13 9562  Gross per 24 hour  Intake    723 ml  Output    450 ml  Net    273 ml   Weight change:  Exam:   General:  Pt is alert, follows commands appropriately, not in acute distress  HEENT: No icterus, No thrush,  [place]/AT  Cardiovascular: RRR, S1/S2, no rubs, no gallops  Respiratory: Bilateral wheezing. Good air movement. Scattered rhonchi.  Abdomen: Soft/+BS, non tender, non distended, no guarding  Extremities: Trace lower extremity edema, No lymphangitis, No petechiae, No rashes, no synovitis  Data Reviewed: Basic Metabolic Panel:  Recent Labs Lab 08/03/13 1042 08/04/13 0548 08/05/13 0534 08/06/13 0511  NA 142 142 141 140  K 3.9 4.6 4.2 4.2  CL 104 106 106 102  CO2 26 23 23 24   GLUCOSE 120* 196* 186* 186*  BUN 25* 32* 38* 49*  CREATININE 1.63* 1.62* 1.55* 1.74*  CALCIUM 10.5  10.2 10.1 10.3   Liver Function Tests:  Recent Labs Lab 08/03/13 1042 08/04/13 0548  AST 19 18  ALT 11 11  ALKPHOS 67 62  BILITOT 0.4 0.3  PROT 7.4 6.9  ALBUMIN 3.7 3.3*   No results found for this basename: LIPASE, AMYLASE,  in the last 168 hours No  results found for this basename: AMMONIA,  in the last 168 hours CBC:  Recent Labs Lab 08/03/13 1042 08/04/13 0548 08/06/13 0511  WBC 7.0 13.0* 17.9*  NEUTROABS 5.1  --   --   HGB 11.0* 10.2* 11.8*  HCT 36.2 33.1* 38.5  MCV 75.4* 75.2* 75.8*  PLT 219 210 245   Cardiac Enzymes:  Recent Labs Lab 08/03/13 1042  TROPONINI <0.30   BNP: No components found with this basename: POCBNP,  CBG: No results found for this basename: GLUCAP,  in the last 168 hours  Recent Results (from the past 240 hour(s))  MRSA PCR SCREENING     Status: None   Collection Time    08/03/13  2:26 PM      Result Value Range Status   MRSA by PCR NEGATIVE  NEGATIVE Final   Comment:            The GeneXpert MRSA Assay (FDA     approved for NASAL specimens     only), is one component of a     comprehensive MRSA colonization     surveillance program. It is not     intended to diagnose MRSA     infection nor to guide or     monitor treatment for     MRSA infections.     Scheduled Meds: . ipratropium  0.5 mg Nebulization Q6H   And  . albuterol  2.5 mg Nebulization Q6H  . amiodarone  200 mg Oral Daily  . azithromycin  500 mg Oral Daily  . brimonidine  1 drop Both Eyes Q8H  . cefTRIAXone (ROCEPHIN)  IV  1 g Intravenous Q24H  . docusate sodium  100 mg Oral BID  . dorzolamide  1 drop Both Eyes BID  . furosemide  80 mg Oral BID  . gabapentin  300 mg Oral TID  . guaiFENesin  600 mg Oral BID  . latanoprost  1 drop Both Eyes QHS  . levothyroxine  100 mcg Oral QAC breakfast  . methylPREDNISolone (SOLU-MEDROL) injection  60 mg Intravenous Q6H  . metoprolol  50 mg Oral BH-q7a  . polyethylene glycol  17 g Oral Daily  . potassium chloride SA  40 mEq Oral Daily  . simvastatin  20 mg Oral q1800  . sodium chloride  3 mL Intravenous Q12H  . trimethoprim  100 mg Oral QHS  . Warfarin - Pharmacist Dosing Inpatient   Does not apply Q24H   Continuous Infusions:   Time Spent: 35 minutes  Chaya Jan, MD  [facility] Pager [phone]  If 7PM-7AM, please contact night-coverage [email or web address] Password [email or web address] 08/06/2013, 12:19 PM   LOS: 3 days

## 2013-08-06 NOTE — Progress Notes (Signed)
Follow up visit for support.

## 2013-08-06 NOTE — Progress Notes (Signed)
Inpatient Diabetes Program Recommendations  AACE/ADA: New Consensus Statement on Inpatient Glycemic Control (2013)  Target Ranges:  Prepandial:   less than 140 mg/dL      Peak postprandial:   less than 180 mg/dL (1-2 hours)      Critically ill patients:  140 - 180 mg/dL   Results for KAMDYN, COVEL (MRN [id number]) as of 08/06/2013 11:00  Ref. Range 03/16/2012 07:01  Hemoglobin A1C Latest Range: <5.7 % 5.8 (H)    Results for JAKKI, DOUGHTY (MRN [id number]) as of 08/06/2013 11:00  Ref. Range 08/03/2013 10:42 08/04/2013 05:48 08/05/2013 05:34 08/06/2013 05:11  Glucose Latest Range: 70-99 mg/dL 782 (H) 956 (H) 213 (H) 186 (H)    Inpatient Diabetes Program Recommendations Correction (SSI): Please consider ordering CBGs with Novolog correction while inpatient and receiving steroids. Diet: May want to consider adding Carb Modified to current Heart Healthy diet. A1C:  Please consider ordering an A1C to determine glycemic control over the past 2-3 months.  Note: Patient does not have a documented history of diabetes.  Initial lab glucose was 120 mg/dl on 04/08/56.  Fasting glucose over the past 3 mornings has been greater than 186 mg/dl.  Currently patient is receiving Solumedrol which is likely cause of hyperglycemia.  Please consider ordering CBGs with Novolog correction and ordering an A1C.  Will continue to follow.  Thanks, Orlando Penner, RN, MSN, CCRN Diabetes Coordinator Inpatient Diabetes Program [phone] (Team Pager) [phone] (AP office) [phone] [facility] office)

## 2013-08-06 NOTE — Progress Notes (Signed)
ANTICOAGULATION CONSULT NOTE   Pharmacy Consult for Coumadin  Indication: h/o PE & atrial fibrillation  Allergies  Allergen Reactions  . Naproxen     Patient on coumadin  . Robaxin [Methocarbamol]     Causes patient to be confused   Patient Measurements: Height: 5\' 3"  (160 cm) Weight: 151 lb (68.493 kg) IBW/kg (Calculated) : 52.4  Vital Signs: Temp: 97.4 F (36.3 C) (12/04 0529) Temp src: Oral (12/04 0529) BP: 146/56 mmHg (12/04 0529) Pulse Rate: 75 (12/04 0529)  Labs:  Recent Labs  08/03/13 1042  08/04/13 0548 08/05/13 0534 08/06/13 0511  HGB 11.0*  --  10.2*  --  11.8*  HCT 36.2  --  33.1*  --  38.5  PLT 219  --  210  --  245  LABPROT  --   < > 21.7* 37.1* 37.2*  INR  --   < > 1.96* 3.95* 3.96*  CREATININE 1.63*  --  1.62* 1.55* 1.74*  TROPONINI <0.30  --   --   --   --   < > = values in this interval not displayed. Estimated Creatinine Clearance: 22.3 ml/min (by C-G formula based on Cr of 1.74).  Medical History: Past Medical History  Diagnosis Date  . Hypertension   . High cholesterol   . Arthritis   . Hypothyroidism   . Arrhythmia     pacemaker  . Glaucoma   . Pulmonary embolism 06/04/11  . Coronary artery disease   . Atrial fibrillation   . 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 Anxiety   . COPD (chronic obstructive pulmonary disease)    Medications:  Prescriptions prior to admission  Medication Sig Dispense Refill  . acetaminophen (TYLENOL) 325 MG tablet Take 650 mg by mouth every 6 (six) hours as needed for mild pain or fever.       Marland Kitchen albuterol (PROVENTIL) (2.5 MG/3ML) 0.083% nebulizer solution Take 2.5 mg by nebulization every 6 (six) hours as needed. For shortness of breath      . amiodarone (PACERONE) 200 MG tablet Take 200 mg by mouth daily.       . bimatoprost (LUMIGAN) 0.01 % SOLN Apply 1 drop to eye at bedtime.      . brimonidine (ALPHAGAN P) 0.1 % SOLN Place 1 drop into both eyes 2 (two) times daily.      . Calcium  Carbonate-Vitamin D (CALCIUM 600 + D PO) Take 1 tablet by mouth 2 (two) times daily.      Marland Kitchen docusate sodium (COLACE) 100 MG capsule Take 100 mg by mouth 2 (two) times daily.      . dorzolamide (TRUSOPT) 2 % ophthalmic solution Place 1 drop into both eyes 2 (two) times daily.      . furosemide (LASIX) 80 MG tablet Take 1 tablet (80 mg total) by mouth 2 (two) times daily.  60 tablet  0  . gabapentin (NEURONTIN) 300 MG capsule Take 1 capsule (300 mg total) by mouth 3 (three) times daily.  30 capsule  0  . guaiFENesin (MUCINEX) 600 MG 12 hr tablet Take 1 tablet (600 mg total) by mouth 2 (two) times daily.  14 tablet  0  . hydrOXYzine (VISTARIL) 25 MG capsule Take 1 capsule by mouth daily as needed for anxiety.       Marland Kitchen levothyroxine (SYNTHROID, LEVOTHROID) 100 MCG tablet Take 100 mcg by mouth daily.      . metoprolol (LOPRESSOR) 50 MG tablet Take 50 mg by mouth every morning.      Marland Kitchen  ondansetron (ZOFRAN) 4 MG tablet Take 4 mg by mouth every 6 (six) hours as needed. For nausea      . oxyCODONE-acetaminophen (PERCOCET) 5-325 MG per tablet Take 1 tablet by mouth every 4 (four) hours as needed for pain.  20 tablet  0  . polyethylene glycol powder (GLYCOLAX/MIRALAX) powder Take 17 g by mouth daily.       . potassium chloride SA (K-DUR,KLOR-CON) 20 MEQ tablet Take 2 tablets (40 mEq total) by mouth daily.      . pravastatin (PRAVACHOL) 20 MG tablet Take 1 tablet (20 mg total) by mouth daily.  30 tablet  11  . trimethoprim (TRIMPEX) 100 MG tablet Take 100 mg by mouth at bedtime.      Marland Kitchen warfarin (COUMADIN) 4 MG tablet Take 2-4 mg by mouth daily. Take 1/2 tablet (2mg ) daily except 1 tablet (4mg ) on Mon & Fri.      . Alum & Mag Hydroxide-Simeth (MAGIC MOUTHWASH) SOLN Take 10 mLs by mouth 4 (four) times daily as needed.       Assessment: 79yo female admitted for worsening SOB.  Pt on Coumadin PTA for h/o PE.  Home dose is listed above.  INR is sub-therapeutic on admission, but has trended up after 2 dose increases  and is now supra-therapeutic.  Patient has also started on Zithromax which can cause increased INR. No bleeding noted.     Goal of Therapy:  INR 2-3   Plan:  Hold Coumadin today  INR daily  Margo Aye, Ellsworth Waldschmidt A 08/06/2013,9:52 AM

## 2013-08-07 LAB — PROTIME-INR: INR: 3.2 — ABNORMAL HIGH (ref 0.00–1.49)

## 2013-08-07 NOTE — Clinical Social Work Note (Signed)
CSW met with pt and son to discuss d/c plan again. Pt is concerned because her roommate at [facility] is dying and she is having a hard time sharing a room at this point due to this. CSW discussed other options with her as there are no other available rooms at [facility]. Ultimately, pt decided to return to [facility]. Facility updated on pt and aware of potential weekend d/c.  Derenda Fennel, LCSW [phone]

## 2013-08-07 NOTE — Progress Notes (Signed)
ANTICOAGULATION CONSULT NOTE   Pharmacy Consult for Coumadin  Indication: h/o PE & atrial fibrillation  Allergies  Allergen Reactions  . Naproxen     Patient on coumadin  . Robaxin [Methocarbamol]     Causes patient to be confused   Patient Measurements: Height: 5\' 3"  (160 cm) Weight: 151 lb (68.493 kg) IBW/kg (Calculated) : 52.4  Vital Signs: Temp: 97.6 F (36.4 C) (12/05 0359) Temp src: Oral (12/05 0359) BP: 126/55 mmHg (12/05 0359) Pulse Rate: 72 (12/05 0838)  Labs:  Recent Labs  08/05/13 0534 08/06/13 0511 08/07/13 0523  HGB  --  11.8*  --   HCT  --  38.5  --   PLT  --  245  --   LABPROT 37.1* 37.2* 31.6*  INR 3.95* 3.96* 3.20*  CREATININE 1.55* 1.74*  --    Estimated Creatinine Clearance: 22.3 ml/min (by C-G formula based on Cr of 1.74).  Medical History: Past Medical History  Diagnosis Date  . Hypertension   . High cholesterol   . Arthritis   . Hypothyroidism   . Arrhythmia     pacemaker  . Glaucoma   . Pulmonary embolism 06/04/11  . Coronary artery disease   . Atrial fibrillation   . 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 Anxiety   . COPD (chronic obstructive pulmonary disease)    Medications:  Prescriptions prior to admission  Medication Sig Dispense Refill  . acetaminophen (TYLENOL) 325 MG tablet Take 650 mg by mouth every 6 (six) hours as needed for mild pain or fever.       Marland Kitchen albuterol (PROVENTIL) (2.5 MG/3ML) 0.083% nebulizer solution Take 2.5 mg by nebulization every 6 (six) hours as needed. For shortness of breath      . amiodarone (PACERONE) 200 MG tablet Take 200 mg by mouth daily.       . bimatoprost (LUMIGAN) 0.01 % SOLN Apply 1 drop to eye at bedtime.      . brimonidine (ALPHAGAN P) 0.1 % SOLN Place 1 drop into both eyes 2 (two) times daily.      . Calcium Carbonate-Vitamin D (CALCIUM 600 + D PO) Take 1 tablet by mouth 2 (two) times daily.      Marland Kitchen docusate sodium (COLACE) 100 MG capsule Take 100 mg by mouth 2 (two) times daily.       . dorzolamide (TRUSOPT) 2 % ophthalmic solution Place 1 drop into both eyes 2 (two) times daily.      . furosemide (LASIX) 80 MG tablet Take 1 tablet (80 mg total) by mouth 2 (two) times daily.  60 tablet  0  . gabapentin (NEURONTIN) 300 MG capsule Take 1 capsule (300 mg total) by mouth 3 (three) times daily.  30 capsule  0  . guaiFENesin (MUCINEX) 600 MG 12 hr tablet Take 1 tablet (600 mg total) by mouth 2 (two) times daily.  14 tablet  0  . hydrOXYzine (VISTARIL) 25 MG capsule Take 1 capsule by mouth daily as needed for anxiety.       Marland Kitchen levothyroxine (SYNTHROID, LEVOTHROID) 100 MCG tablet Take 100 mcg by mouth daily.      . metoprolol (LOPRESSOR) 50 MG tablet Take 50 mg by mouth every morning.      . ondansetron (ZOFRAN) 4 MG tablet Take 4 mg by mouth every 6 (six) hours as needed. For nausea      . oxyCODONE-acetaminophen (PERCOCET) 5-325 MG per tablet Take 1 tablet by mouth every 4 (four) hours as  needed for pain.  20 tablet  0  . polyethylene glycol powder (GLYCOLAX/MIRALAX) powder Take 17 g by mouth daily.       . potassium chloride SA (K-DUR,KLOR-CON) 20 MEQ tablet Take 2 tablets (40 mEq total) by mouth daily.      . pravastatin (PRAVACHOL) 20 MG tablet Take 1 tablet (20 mg total) by mouth daily.  30 tablet  11  . trimethoprim (TRIMPEX) 100 MG tablet Take 100 mg by mouth at bedtime.      Marland Kitchen warfarin (COUMADIN) 4 MG tablet Take 2-4 mg by mouth daily. Take 1/2 tablet (2mg ) daily except 1 tablet (4mg ) on Mon & Fri.      . Alum & Mag Hydroxide-Simeth (MAGIC MOUTHWASH) SOLN Take 10 mLs by mouth 4 (four) times daily as needed.       Assessment: 79yo female admitted for worsening SOB.  Pt on Coumadin PTA for h/o PE.  Home dose is listed above.  INR is sub-therapeutic on admission, but has trended up after 2 dose increases and is now supra-therapeutic.  Patient has also started on Zithromax which can cause increased INR. No bleeding noted.     Goal of Therapy:  INR 2-3   Plan:  Hold  Coumadin today  INR daily  Denarius Sesler, [facility] 08/07/2013,10:58 AM

## 2013-08-07 NOTE — Care Management Note (Signed)
    Page 1 of 1   08/07/2013     10:49:07 AM   CARE MANAGEMENT NOTE 08/07/2013  Patient:  [name], [name]   Account Number:  [id number]  Date Initiated:  08/07/2013  Documentation initiated by:  Sharrie Rothman  Subjective/Objective Assessment:   Pt admitted from [facility] with COPD. Pt will return to facility at discharge.     Action/Plan:   CSW to arrange discharge to facility when medically stable.   Anticipated DC Date:  08/10/2013   Anticipated DC Plan:  ASSISTED LIVING / REST HOME  In-house referral  Clinical Social Worker      DC Planning Services  CM consult      Choice offered to / List presented to:             Status of service:  Completed, signed off Medicare Important Message given?  YES (If response is "NO", the following Medicare IM given date fields will be blank) Date Medicare IM given:  08/07/2013 Date Additional Medicare IM given:    Discharge Disposition:  ASSISTED LIVING  Per UR Regulation:    If discussed at Long Length of Stay Meetings, dates discussed:    Comments:  08/07/13 1045 Arlyss Queen, RN BSN CM

## 2013-08-07 NOTE — Clinical Social Work Psychosocial (Signed)
Clinical Social Work Department BRIEF PSYCHOSOCIAL ASSESSMENT 08/07/2013  Patient:  [name], [name]     Account Number:  [id number]     Admit date:  08/03/2013  Clinical Social Worker:  Sherrlyn Hock  Date/Time:  08/04/2013 08:40 AM  Referred by:  Physician  Date Referred:  08/04/2013 Referred for  ALF Placement   Other Referral:   Interview type:  Patient Other interview type:    PSYCHOSOCIAL DATA Living Status:  FACILITY Admitted from facility:  [facility] Level of care:  Assisted Living Primary support name:  Molly Maduro Primary support relationship to patient:  SPOUSE Degree of support available:   supportive    CURRENT CONCERNS Current Concerns  Post-Acute Placement   Other Concerns:    SOCIAL WORK ASSESSMENT / PLAN CSW met with pt at bedside. Pt alert and oriented and well known to CSW from previous admissions. She reports she became very short of breath yesterday and was wheezing and came to ED. Admitted with COPD exacerbation. Pt has very supportive family. Her husband and son check on her often. Pt states her husband has been sick recently so has not been able to come see her quite as much. Pt has been resident at [facility] for about a year and a half. She is on the AL unit and is a limited assist at baseline. When pt is not feeling well, she requires assist with bathing, dressing, and toileting. She generally uses a wheelchair, but will ambulate using a walker while in PT at [facility]. Ok to return per [place] at facility.   Assessment/plan status:  Psychosocial Support/Ongoing Assessment of Needs Other assessment/ plan:   Information/referral to community resources:   [employer]    PATIENT'S/FAMILY'S RESPONSE TO PLAN OF CARE: Pt reports that she continues to adjust to being in a facility. She states that the staff is good to her and she plans to return to [facility] when medically stable. CSW will continue to follow.        Derenda Fennel, [place] [phone]

## 2013-08-07 NOTE — Progress Notes (Signed)
[facility] PROGRESS NOTE  [name] ZOX:[id number] DOB: 03/26/1927 DOA: 08/03/2013 PCP: Cassell Smiles., MD  Assessment/Plan:  Acute COPD exacerbation -Will continue IV Solu-Medrol at current dose given difficult to treat COPD, however this is the first day I see improvement in her respiratory status. -Continue Rocephin/azithromycin. -Roommate at ALF with current pneumonia. -Continue aerosolized albuterol and Atrovent--increase frequency to every 6 hrs with q2prn sob. -Would benefit from Spiriva on discharge.  Chronic diastolic CHF -ProBNP is near the patient's baseline -Patient's weight is unchanged from when she was discharged on 01/21/2013 (151 lbs) -Dry weight 151-153 pounds -Continue home dose of furosemide -Daily weight -Jan 19, 2013--EF of 60-65% -No clinical signs of volume overload at present.  Paroxysmal atrial fibrillation -Continue amiodarone -Rate controlled -Continue warfarin. INR is supra-therapeutic. Pharmacy dosing. -Continue metoprolol tartrate  CKD stage III-IV -Baseline creatinine 1.3-1.6 -Monitor renal function on furosemide -Creatinine a little above baseline at 1.74. -Recheck in am. -May need to consider temporarily stopping lasix if Cr continues to increase.  Hypertension -Controlled -Continue current antihypertensive regimen  Family Communication:   Patient and friend at bedside updated on plan of care. Disposition Plan:   Back to ALF once medically stable, anticipate in 48 hours.   Antibiotics:  Rocephin 08/04/13-->  Azithro 08/04/13-->    Procedures/Studies: Dg Chest Portable 1 View  08/03/2013   CLINICAL DATA:  Shortness of breath and wheezing.  EXAM: PORTABLE CHEST - 1 VIEW  COMPARISON:  02/23/2013  FINDINGS: There is stable cardiomegaly and appearance of a dual-chamber pacemaker. Lungs show more prominent interstitial thickening and bronchial thickening compared to multiple prior exams suggestive of a component of  interstitial edema/pneumonitis or bronchitis superimposed on chronic disease. No overt pulmonary edema or significant pleural fluid is identified. There is stable parenchymal scarring in the left lung.  IMPRESSION: Suggestion of interstitial/bronchial process superimposed on chronic disease. This may be related to interstitial edema, pneumonitis or bronchitis.   Electronically Signed   By: Irish Lack M.D.   On: 08/03/2013 11:00         Subjective: Patient states she feels terrible. Says her breathing is worse.  Objective: Filed Vitals:   08/07/13 0306 08/07/13 0359 08/07/13 0713 08/07/13 0838  BP:  126/55    Pulse:  72  72  Temp:  97.6 F (36.4 C)    TempSrc:  Oral    Resp:  20  18  Height:      Weight:      SpO2: 97% 99% 96% 96%    Intake/Output Summary (Last 24 hours) at 08/07/13 1408 Last data filed at 08/07/13 1348  Gross per 24 hour  Intake    720 ml  Output    950 ml  Net   -230 ml   Weight change:  Exam:   General:  Pt is alert, follows commands appropriately, not in acute distress  HEENT: No icterus, No thrush,  [place]/AT  Cardiovascular: RRR, S1/S2, no rubs, no gallops  Respiratory: Good air movement, decrease in bilateral wheezes.  Abdomen: Soft/+BS, non tender, non distended, no guarding  Extremities: Trace lower extremity edema, No lymphangitis, No petechiae, No rashes, no synovitis  Data Reviewed: Basic Metabolic Panel:  Recent Labs Lab 08/03/13 1042 08/04/13 0548 08/05/13 0534 08/06/13 0511  NA 142 142 141 140  K 3.9 4.6 4.2 4.2  CL 104 106 106 102  CO2 26 23 23 24   GLUCOSE 120* 196* 186* 186*  BUN 25* 32* 38* 49*  CREATININE 1.63* 1.62* 1.55* 1.74*  CALCIUM 10.5 10.2 10.1 10.3   Liver Function Tests:  Recent Labs Lab 08/03/13 1042 08/04/13 0548  AST 19 18  ALT 11 11  ALKPHOS 67 62  BILITOT 0.4 0.3  PROT 7.4 6.9  ALBUMIN 3.7 3.3*   No results found for this basename: LIPASE, AMYLASE,  in the last 168 hours No results found  for this basename: AMMONIA,  in the last 168 hours CBC:  Recent Labs Lab 08/03/13 1042 08/04/13 0548 08/06/13 0511  WBC 7.0 13.0* 17.9*  NEUTROABS 5.1  --   --   HGB 11.0* 10.2* 11.8*  HCT 36.2 33.1* 38.5  MCV 75.4* 75.2* 75.8*  PLT 219 210 245   Cardiac Enzymes:  Recent Labs Lab 08/03/13 1042  TROPONINI <0.30   BNP: No components found with this basename: POCBNP,  CBG: No results found for this basename: GLUCAP,  in the last 168 hours  Recent Results (from the past 240 hour(s))  MRSA PCR SCREENING     Status: None   Collection Time    08/03/13  2:26 PM      Result Value Range Status   MRSA by PCR NEGATIVE  NEGATIVE Final   Comment:            The GeneXpert MRSA Assay (FDA     approved for NASAL specimens     only), is one component of a     comprehensive MRSA colonization     surveillance program. It is not     intended to diagnose MRSA     infection nor to guide or     monitor treatment for     MRSA infections.     Scheduled Meds: . ipratropium  0.5 mg Nebulization Q6H   And  . albuterol  2.5 mg Nebulization Q6H  . amiodarone  200 mg Oral Daily  . azithromycin  500 mg Oral Daily  . brimonidine  1 drop Both Eyes Q8H  . cefTRIAXone (ROCEPHIN)  IV  1 g Intravenous Q24H  . docusate sodium  100 mg Oral BID  . dorzolamide  1 drop Both Eyes BID  . furosemide  80 mg Oral BID  . gabapentin  300 mg Oral TID  . guaiFENesin  600 mg Oral BID  . latanoprost  1 drop Both Eyes QHS  . levothyroxine  100 mcg Oral QAC breakfast  . methylPREDNISolone (SOLU-MEDROL) injection  60 mg Intravenous Q6H  . metoprolol  50 mg Oral BH-q7a  . polyethylene glycol  17 g Oral Daily  . potassium chloride SA  40 mEq Oral Daily  . simvastatin  20 mg Oral q1800  . sodium chloride  3 mL Intravenous Q12H  . trimethoprim  100 mg Oral QHS  . Warfarin - Pharmacist Dosing Inpatient   Does not apply Q24H   Continuous Infusions:   Time Spent: 35 minutes  Chaya Jan,  MD  [facility] Pager [phone]  If 7PM-7AM, please contact night-coverage [email or web address] Password [email or web address] 08/07/2013, 2:08 PM   LOS: 4 days

## 2013-08-07 NOTE — Progress Notes (Signed)
Pt. States that she is not feeling any better and at times, feels worse.  Pt. Continues to be short of break with exertion.  Will continue to monitor.  Ellene Route 08/07/2013

## 2013-08-08 DIAGNOSIS — N179 Acute kidney failure, unspecified: Secondary | ICD-10-CM

## 2013-08-08 DIAGNOSIS — D72829 Elevated white blood cell count, unspecified: Secondary | ICD-10-CM

## 2013-08-08 LAB — CBC
HCT: 36.5 % (ref 36.0–46.0)
Hemoglobin: 11.2 g/dL — ABNORMAL LOW (ref 12.0–15.0)
MCH: 22.9 pg — ABNORMAL LOW (ref 26.0–34.0)
MCHC: 30.7 g/dL (ref 30.0–36.0)
MCV: 74.6 fL — ABNORMAL LOW (ref 78.0–100.0)
RBC: 4.89 MIL/uL (ref 3.87–5.11)

## 2013-08-08 LAB — BASIC METABOLIC PANEL
BUN: 49 mg/dL — ABNORMAL HIGH (ref 6–23)
CO2: 28 mEq/L (ref 19–32)
Chloride: 99 mEq/L (ref 96–112)
GFR calc non Af Amer: 36 mL/min — ABNORMAL LOW (ref >90)
Glucose, Bld: 191 mg/dL — ABNORMAL HIGH (ref 70–99)
Potassium: 4.2 mEq/L (ref 3.5–5.1)

## 2013-08-08 LAB — PROTIME-INR: Prothrombin Time: 32.5 seconds — ABNORMAL HIGH (ref 11.6–15.2)

## 2013-08-08 MED ORDER — NYSTATIN 100000 UNIT/ML MT SUSP
5.0000 mL | Freq: Four times a day (QID) | OROMUCOSAL | Status: DC
  Administered 2013-08-08 – 2013-08-10 (×7): 500000 [IU] via ORAL
  Filled 2013-08-08 (×7): qty 5

## 2013-08-08 MED ORDER — BIOTENE DRY MOUTH MT LIQD
15.0000 mL | Freq: Two times a day (BID) | OROMUCOSAL | Status: DC
  Administered 2013-08-09 – 2013-08-10 (×3): 15 mL via OROMUCOSAL

## 2013-08-08 MED ORDER — PREDNISONE 20 MG PO TABS
60.0000 mg | ORAL_TABLET | Freq: Every day | ORAL | Status: DC
  Administered 2013-08-09 – 2013-08-10 (×2): 60 mg via ORAL
  Filled 2013-08-08 (×2): qty 3

## 2013-08-08 NOTE — Progress Notes (Signed)
[facility] PROGRESS NOTE  MAILEN NEWBORN UJW:[id number] DOB: 1926-10-03 DOA: 08/03/2013 PCP: Cassell Smiles., MD  Assessment/Plan:  Acute COPD exacerbation -Significant decrease in wheezing today. -Will start steroid titration. -Continue Rocephin/azithromycin. -Roommate at ALF with current pneumonia. -Continue aerosolized albuterol and Atrovent--increase frequency to every 6 hrs with q2prn sob. -Would benefit from Spiriva on discharge.  Chronic diastolic CHF -ProBNP is near the patient's baseline -Patient's weight is unchanged from when she was discharged on 01/21/2013 (151 lbs) -Dry weight 151-153 pounds -Continue home dose of furosemide -Daily weight -Jan 19, 2013--EF of 60-65% -No clinical signs of volume overload at present.  Paroxysmal atrial fibrillation -Continue amiodarone -Rate controlled -Continue warfarin. INR is supra-therapeutic. Pharmacy dosing. -Continue metoprolol.  CKD stage III-IV -Baseline creatinine 1.3-1.6 -Monitor renal function on furosemide -Creatinine today is back to baseline at 1.3.  Hypertension -Controlled -Continue current antihypertensive regimen  Family Communication:  Patient only Disposition Plan:   Back to ALF once medically stable, anticipate in 24/48 hours.   Antibiotics:  Rocephin 08/04/13-->  Azithro 08/04/13-->    Procedures/Studies: Dg Chest Portable 1 View  08/03/2013   CLINICAL DATA:  Shortness of breath and wheezing.  EXAM: PORTABLE CHEST - 1 VIEW  COMPARISON:  02/23/2013  FINDINGS: There is stable cardiomegaly and appearance of a dual-chamber pacemaker. Lungs show more prominent interstitial thickening and bronchial thickening compared to multiple prior exams suggestive of a component of interstitial edema/pneumonitis or bronchitis superimposed on chronic disease. No overt pulmonary edema or significant pleural fluid is identified. There is stable parenchymal scarring in the left lung.  IMPRESSION: Suggestion of  interstitial/bronchial process superimposed on chronic disease. This may be related to interstitial edema, pneumonitis or bronchitis.   Electronically Signed   By: Irish Lack M.D.   On: 08/03/2013 11:00         Subjective: Patient states she feels terrible. Says her breathing is worse.  Objective: Filed Vitals:   08/08/13 0219 08/08/13 0645 08/08/13 0837 08/08/13 1421  BP:  154/70  118/48  Pulse:  71  76  Temp:  97.4 F (36.3 C)  99.3 F (37.4 C)  TempSrc:  Oral  Oral  Resp:  18  18  Height:      Weight:      SpO2: 96% 99% 96% 96%    Intake/Output Summary (Last 24 hours) at 08/08/13 1433 Last data filed at 08/08/13 1200  Gross per 24 hour  Intake   1040 ml  Output    450 ml  Net    590 ml   Weight change:  Exam:   General:  Pt is alert, follows commands appropriately, not in acute distress  HEENT: No icterus, No thrush,  [place]/AT  Cardiovascular: RRR, S1/S2, no rubs, no gallops  Respiratory: Good air movement, decrease in bilateral wheezes.  Abdomen: Soft/+BS, non tender, non distended, no guarding  Extremities: Trace lower extremity edema, No lymphangitis, No petechiae, No rashes, no synovitis  Data Reviewed: Basic Metabolic Panel:  Recent Labs Lab 08/03/13 1042 08/04/13 0548 08/05/13 0534 08/06/13 0511 08/08/13 0608  NA 142 142 141 140 143  K 3.9 4.6 4.2 4.2 4.2  CL 104 106 106 102 99  CO2 26 23 23 24 28   GLUCOSE 120* 196* 186* 186* 191*  BUN 25* 32* 38* 49* 49*  CREATININE 1.63* 1.62* 1.55* 1.74* 1.33*  CALCIUM 10.5 10.2 10.1 10.3 9.8   Liver Function Tests:  Recent Labs Lab 08/03/13 1042 08/04/13 0548  AST 19 18  ALT 11 11  ALKPHOS 67 62  BILITOT 0.4 0.3  PROT 7.4 6.9  ALBUMIN 3.7 3.3*   No results found for this basename: LIPASE, AMYLASE,  in the last 168 hours No results found for this basename: AMMONIA,  in the last 168 hours CBC:  Recent Labs Lab 08/03/13 1042 08/04/13 0548 08/06/13 0511 08/08/13 0608  WBC 7.0 13.0*  17.9* 12.9*  NEUTROABS 5.1  --   --   --   HGB 11.0* 10.2* 11.8* 11.2*  HCT 36.2 33.1* 38.5 36.5  MCV 75.4* 75.2* 75.8* 74.6*  PLT 219 210 245 240   Cardiac Enzymes:  Recent Labs Lab 08/03/13 1042  TROPONINI <0.30   BNP: No components found with this basename: POCBNP,  CBG: No results found for this basename: GLUCAP,  in the last 168 hours  Recent Results (from the past 240 hour(s))  MRSA PCR SCREENING     Status: None   Collection Time    08/03/13  2:26 PM      Result Value Range Status   MRSA by PCR NEGATIVE  NEGATIVE Final   Comment:            The GeneXpert MRSA Assay (FDA     approved for NASAL specimens     only), is one component of a     comprehensive MRSA colonization     surveillance program. It is not     intended to diagnose MRSA     infection nor to guide or     monitor treatment for     MRSA infections.     Scheduled Meds: . ipratropium  0.5 mg Nebulization Q6H   And  . albuterol  2.5 mg Nebulization Q6H  . amiodarone  200 mg Oral Daily  . azithromycin  500 mg Oral Daily  . brimonidine  1 drop Both Eyes Q8H  . cefTRIAXone (ROCEPHIN)  IV  1 g Intravenous Q24H  . docusate sodium  100 mg Oral BID  . dorzolamide  1 drop Both Eyes BID  . furosemide  80 mg Oral BID  . gabapentin  300 mg Oral TID  . guaiFENesin  600 mg Oral BID  . latanoprost  1 drop Both Eyes QHS  . levothyroxine  100 mcg Oral QAC breakfast  . metoprolol  50 mg Oral BH-q7a  . polyethylene glycol  17 g Oral Daily  . potassium chloride SA  40 mEq Oral Daily  . [START ON 08/09/2013] predniSONE  60 mg Oral Q breakfast  . simvastatin  20 mg Oral q1800  . sodium chloride  3 mL Intravenous Q12H  . trimethoprim  100 mg Oral QHS  . Warfarin - Pharmacist Dosing Inpatient   Does not apply Q24H   Continuous Infusions:   Time Spent: 35 minutes  Chaya Jan, MD  [facility] Pager [phone]  If 7PM-7AM, please contact night-coverage [email or web address] Password  [email or web address] 08/08/2013, 2:33 PM   LOS: 5 days

## 2013-08-08 NOTE — Progress Notes (Signed)
ANTICOAGULATION CONSULT NOTE   Pharmacy Consult for Coumadin  Indication: h/o PE & atrial fibrillation  Allergies  Allergen Reactions  . Naproxen     Patient on coumadin  . Robaxin [Methocarbamol]     Causes patient to be confused   Patient Measurements: Height: 5\' 3"  (160 cm) Weight: 151 lb (68.493 kg) IBW/kg (Calculated) : 52.4  Vital Signs: Temp: 97.4 F (36.3 C) (12/06 0645) Temp src: Oral (12/06 0645) BP: 154/70 mmHg (12/06 0645) Pulse Rate: 71 (12/06 0645)  Labs:  Recent Labs  08/06/13 0511 08/07/13 0523 08/08/13 0608  HGB 11.8*  --  11.2*  HCT 38.5  --  36.5  PLT 245  --  240  LABPROT 37.2* 31.6* 32.5*  INR 3.96* 3.20* 3.32*  CREATININE 1.74*  --  1.33*   Estimated Creatinine Clearance: 29.2 ml/min (by C-G formula based on Cr of 1.33).  Medical History: Past Medical History  Diagnosis Date  . Hypertension   . High cholesterol   . Arthritis   . Hypothyroidism   . Arrhythmia     pacemaker  . Glaucoma   . Pulmonary embolism 06/04/11  . Coronary artery disease   . Atrial fibrillation   . 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 Anxiety   . COPD (chronic obstructive pulmonary disease)    Medications:  Prescriptions prior to admission  Medication Sig Dispense Refill  . acetaminophen (TYLENOL) 325 MG tablet Take 650 mg by mouth every 6 (six) hours as needed for mild pain or fever.       Marland Kitchen albuterol (PROVENTIL) (2.5 MG/3ML) 0.083% nebulizer solution Take 2.5 mg by nebulization every 6 (six) hours as needed. For shortness of breath      . amiodarone (PACERONE) 200 MG tablet Take 200 mg by mouth daily.       . bimatoprost (LUMIGAN) 0.01 % SOLN Apply 1 drop to eye at bedtime.      . brimonidine (ALPHAGAN P) 0.1 % SOLN Place 1 drop into both eyes 2 (two) times daily.      . Calcium Carbonate-Vitamin D (CALCIUM 600 + D PO) Take 1 tablet by mouth 2 (two) times daily.      Marland Kitchen docusate sodium (COLACE) 100 MG capsule Take 100 mg by mouth 2 (two) times daily.       . dorzolamide (TRUSOPT) 2 % ophthalmic solution Place 1 drop into both eyes 2 (two) times daily.      . furosemide (LASIX) 80 MG tablet Take 1 tablet (80 mg total) by mouth 2 (two) times daily.  60 tablet  0  . gabapentin (NEURONTIN) 300 MG capsule Take 1 capsule (300 mg total) by mouth 3 (three) times daily.  30 capsule  0  . guaiFENesin (MUCINEX) 600 MG 12 hr tablet Take 1 tablet (600 mg total) by mouth 2 (two) times daily.  14 tablet  0  . hydrOXYzine (VISTARIL) 25 MG capsule Take 1 capsule by mouth daily as needed for anxiety.       Marland Kitchen levothyroxine (SYNTHROID, LEVOTHROID) 100 MCG tablet Take 100 mcg by mouth daily.      . metoprolol (LOPRESSOR) 50 MG tablet Take 50 mg by mouth every morning.      . ondansetron (ZOFRAN) 4 MG tablet Take 4 mg by mouth every 6 (six) hours as needed. For nausea      . oxyCODONE-acetaminophen (PERCOCET) 5-325 MG per tablet Take 1 tablet by mouth every 4 (four) hours as needed for pain.  20 tablet  0  . polyethylene glycol powder (GLYCOLAX/MIRALAX) powder Take 17 g by mouth daily.       . potassium chloride SA (K-DUR,KLOR-CON) 20 MEQ tablet Take 2 tablets (40 mEq total) by mouth daily.      . pravastatin (PRAVACHOL) 20 MG tablet Take 1 tablet (20 mg total) by mouth daily.  30 tablet  11  . trimethoprim (TRIMPEX) 100 MG tablet Take 100 mg by mouth at bedtime.      Marland Kitchen warfarin (COUMADIN) 4 MG tablet Take 2-4 mg by mouth daily. Take 1/2 tablet (2mg ) daily except 1 tablet (4mg ) on Mon & Fri.      . Alum & Mag Hydroxide-Simeth (MAGIC MOUTHWASH) SOLN Take 10 mLs by mouth 4 (four) times daily as needed.       Assessment: 79yo female admitted for worsening SOB.  Pt on Coumadin PTA for h/o PE.  Home dose is listed above.  INR is sub-therapeutic on admission, but has trended up after 2 dose increases and is now supra-therapeutic.  Patient has also started on Zithromax which can cause increased INR. No bleeding noted.     Goal of Therapy:  INR 2-3   Plan:  Hold  Coumadin today  INR daily  Margo Aye, Ben Habermann A 08/08/2013,10:25 AM

## 2013-08-09 LAB — PROTIME-INR: INR: 2.54 — ABNORMAL HIGH (ref 0.00–1.49)

## 2013-08-09 MED ORDER — WARFARIN SODIUM 2 MG PO TABS
2.0000 mg | ORAL_TABLET | Freq: Once | ORAL | Status: AC
  Administered 2013-08-09: 2 mg via ORAL
  Filled 2013-08-09: qty 1

## 2013-08-09 NOTE — Progress Notes (Signed)
ANTICOAGULATION CONSULT NOTE   Pharmacy Consult for Coumadin  Indication: h/o PE & atrial fibrillation  Allergies  Allergen Reactions  . Naproxen     Patient on coumadin  . Robaxin [Methocarbamol]     Causes patient to be confused   Patient Measurements: Height: 5\' 3"  (160 cm) Weight: 151 lb (68.493 kg) IBW/kg (Calculated) : 52.4  Vital Signs: Temp: 98.2 F (36.8 C) (12/07 0525) Temp src: Oral (12/07 0525) BP: 161/80 mmHg (12/07 0525) Pulse Rate: 72 (12/07 0525)  Labs:  Recent Labs  08/07/13 0523 08/08/13 0608 08/09/13 0611  HGB  --  11.2*  --   HCT  --  36.5  --   PLT  --  240  --   LABPROT 31.6* 32.5* 26.5*  INR 3.20* 3.32* 2.54*  CREATININE  --  1.33*  --    Estimated Creatinine Clearance: 29.2 ml/min (by C-G formula based on Cr of 1.33).  Medical History: Past Medical History  Diagnosis Date  . Hypertension   . High cholesterol   . Arthritis   . Hypothyroidism   . Arrhythmia     pacemaker  . Glaucoma   . Pulmonary embolism 06/04/11  . Coronary artery disease   . Atrial fibrillation   . 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 Anxiety   . COPD (chronic obstructive pulmonary disease)    Medications:  Prescriptions prior to admission  Medication Sig Dispense Refill  . acetaminophen (TYLENOL) 325 MG tablet Take 650 mg by mouth every 6 (six) hours as needed for mild pain or fever.       Marland Kitchen albuterol (PROVENTIL) (2.5 MG/3ML) 0.083% nebulizer solution Take 2.5 mg by nebulization every 6 (six) hours as needed. For shortness of breath      . amiodarone (PACERONE) 200 MG tablet Take 200 mg by mouth daily.       . bimatoprost (LUMIGAN) 0.01 % SOLN Apply 1 drop to eye at bedtime.      . brimonidine (ALPHAGAN P) 0.1 % SOLN Place 1 drop into both eyes 2 (two) times daily.      . Calcium Carbonate-Vitamin D (CALCIUM 600 + D PO) Take 1 tablet by mouth 2 (two) times daily.      Marland Kitchen docusate sodium (COLACE) 100 MG capsule Take 100 mg by mouth 2 (two) times daily.       . dorzolamide (TRUSOPT) 2 % ophthalmic solution Place 1 drop into both eyes 2 (two) times daily.      . furosemide (LASIX) 80 MG tablet Take 1 tablet (80 mg total) by mouth 2 (two) times daily.  60 tablet  0  . gabapentin (NEURONTIN) 300 MG capsule Take 1 capsule (300 mg total) by mouth 3 (three) times daily.  30 capsule  0  . guaiFENesin (MUCINEX) 600 MG 12 hr tablet Take 1 tablet (600 mg total) by mouth 2 (two) times daily.  14 tablet  0  . hydrOXYzine (VISTARIL) 25 MG capsule Take 1 capsule by mouth daily as needed for anxiety.       Marland Kitchen levothyroxine (SYNTHROID, LEVOTHROID) 100 MCG tablet Take 100 mcg by mouth daily.      . metoprolol (LOPRESSOR) 50 MG tablet Take 50 mg by mouth every morning.      . ondansetron (ZOFRAN) 4 MG tablet Take 4 mg by mouth every 6 (six) hours as needed. For nausea      . oxyCODONE-acetaminophen (PERCOCET) 5-325 MG per tablet Take 1 tablet by mouth every 4 (four)  hours as needed for pain.  20 tablet  0  . polyethylene glycol powder (GLYCOLAX/MIRALAX) powder Take 17 g by mouth daily.       . potassium chloride SA (K-DUR,KLOR-CON) 20 MEQ tablet Take 2 tablets (40 mEq total) by mouth daily.      . pravastatin (PRAVACHOL) 20 MG tablet Take 1 tablet (20 mg total) by mouth daily.  30 tablet  11  . trimethoprim (TRIMPEX) 100 MG tablet Take 100 mg by mouth at bedtime.      Marland Kitchen warfarin (COUMADIN) 4 MG tablet Take 2-4 mg by mouth daily. Take 1/2 tablet (2mg ) daily except 1 tablet (4mg ) on Mon & Fri.      . Alum & Mag Hydroxide-Simeth (MAGIC MOUTHWASH) SOLN Take 10 mLs by mouth 4 (four) times daily as needed.       Assessment: 79yo female admitted for worsening SOB.  Pt on Coumadin PTA for h/o PE.  Home dose is listed above.  INR is sub-therapeutic on admission, but trended up after dose increases to supra-therapeutic range and Coumadin was held.  INR is now therapeutic.  No bleeding noted.     Goal of Therapy:  INR 2-3   Plan:  Coumadin 2mg  po today x 1  INR  daily  Graciano Batson A 08/09/2013,9:11 AM

## 2013-08-09 NOTE — Progress Notes (Signed)
[facility] PROGRESS NOTE  [name] ZOX:[id number] DOB: 28-Oct-1926 DOA: 08/03/2013 PCP: Cassell Smiles., MD  Assessment/Plan:  Acute COPD exacerbation -Still with wheezes today. -Continue prednisone at current dose without further titration. -Continue Rocephin.  -DC azithromycin as she has been on it for 5 days. -Roommate at ALF with current pneumonia. -Continue aerosolized albuterol and Atrovent--increase frequency to every 6 hrs with q2prn sob. -Would benefit from Spiriva on discharge.  Chronic diastolic CHF -ProBNP is near the patient's baseline -Patient's weight is unchanged from when she was discharged on 01/21/2013 (151 lbs) -Dry weight 151-153 pounds -Continue home dose of furosemide -Daily weight -Jan 19, 2013--EF of 60-65% -No clinical signs of volume overload at present.  Paroxysmal atrial fibrillation -Continue amiodarone -Rate controlled -Continue warfarin. INR is supra-therapeutic. Pharmacy dosing. -Continue metoprolol.  CKD stage III-IV -Baseline creatinine 1.3-1.6 -Monitor renal function on furosemide -Creatinine today is back to baseline at 1.3.  Hypertension -Controlled -Continue current antihypertensive regimen  Family Communication:  Patient only Disposition Plan:   Back to ALF once medically stable, anticipate in 24/48 hours.   Antibiotics:  Rocephin 08/04/13-->08/09/2013  Azithro 08/04/13-->    Procedures/Studies: Dg Chest Portable 1 View  08/03/2013   CLINICAL DATA:  Shortness of breath and wheezing.  EXAM: PORTABLE CHEST - 1 VIEW  COMPARISON:  02/23/2013  FINDINGS: There is stable cardiomegaly and appearance of a dual-chamber pacemaker. Lungs show more prominent interstitial thickening and bronchial thickening compared to multiple prior exams suggestive of a component of interstitial edema/pneumonitis or bronchitis superimposed on chronic disease. No overt pulmonary edema or significant pleural fluid is identified. There is  stable parenchymal scarring in the left lung.  IMPRESSION: Suggestion of interstitial/bronchial process superimposed on chronic disease. This may be related to interstitial edema, pneumonitis or bronchitis.   Electronically Signed   By: Irish Lack M.D.   On: 08/03/2013 11:00         Subjective: Patient states she feels terrible. Says her breathing is worse.  Objective: Filed Vitals:   08/08/13 2140 08/09/13 0525 08/09/13 0820 08/09/13 1432  BP: 125/54 161/80    Pulse: 73 72    Temp: 98.1 F (36.7 C) 98.2 F (36.8 C)    TempSrc: Oral Oral    Resp: 18 18    Height:      Weight:      SpO2: 97% 98% 93% 94%    Intake/Output Summary (Last 24 hours) at 08/09/13 1456 Last data filed at 08/09/13 0800  Gross per 24 hour  Intake    773 ml  Output      0 ml  Net    773 ml   Weight change:  Exam:   General:  Pt is alert, follows commands appropriately, not in acute distress  HEENT: No icterus, No thrush,  [place]/AT  Cardiovascular: RRR, S1/S2, no rubs, no gallops  Respiratory: Good air movement, decrease in bilateral wheezes.  Abdomen: Soft/+BS, non tender, non distended, no guarding  Extremities: Trace lower extremity edema, No lymphangitis, No petechiae, No rashes, no synovitis  Data Reviewed: Basic Metabolic Panel:  Recent Labs Lab 08/03/13 1042 08/04/13 0548 08/05/13 0534 08/06/13 0511 08/08/13 0608  NA 142 142 141 140 143  K 3.9 4.6 4.2 4.2 4.2  CL 104 106 106 102 99  CO2 26 23 23 24 28   GLUCOSE 120* 196* 186* 186* 191*  BUN 25* 32* 38* 49* 49*  CREATININE 1.63* 1.62* 1.55* 1.74* 1.33*  CALCIUM 10.5 10.2 10.1 10.3 9.8   Liver  Function Tests:  Recent Labs Lab 08/03/13 1042 08/04/13 0548  AST 19 18  ALT 11 11  ALKPHOS 67 62  BILITOT 0.4 0.3  PROT 7.4 6.9  ALBUMIN 3.7 3.3*   No results found for this basename: LIPASE, AMYLASE,  in the last 168 hours No results found for this basename: AMMONIA,  in the last 168 hours CBC:  Recent Labs Lab  08/03/13 1042 08/04/13 0548 08/06/13 0511 08/08/13 0608  WBC 7.0 13.0* 17.9* 12.9*  NEUTROABS 5.1  --   --   --   HGB 11.0* 10.2* 11.8* 11.2*  HCT 36.2 33.1* 38.5 36.5  MCV 75.4* 75.2* 75.8* 74.6*  PLT 219 210 245 240   Cardiac Enzymes:  Recent Labs Lab 08/03/13 1042  TROPONINI <0.30   BNP: No components found with this basename: POCBNP,  CBG: No results found for this basename: GLUCAP,  in the last 168 hours  Recent Results (from the past 240 hour(s))  MRSA PCR SCREENING     Status: None   Collection Time    08/03/13  2:26 PM      Result Value Range Status   MRSA by PCR NEGATIVE  NEGATIVE Final   Comment:            The GeneXpert MRSA Assay (FDA     approved for NASAL specimens     only), is one component of a     comprehensive MRSA colonization     surveillance program. It is not     intended to diagnose MRSA     infection nor to guide or     monitor treatment for     MRSA infections.  CLOSTRIDIUM DIFFICILE BY PCR     Status: Abnormal   Collection Time    08/08/13  3:27 PM      Result Value Range Status   C difficile by pcr   (*) NEGATIVE Final   Value: FORMED STOOL SPECIMEN SUBMITTED.  DOES NOT MEET TESTING CRITERIA, ORDER CREDITED   Comment:  CALLED TO [place],M BY GODFREY,O ON 08/08/13 AT 1548.     Scheduled Meds: . ipratropium  0.5 mg Nebulization Q6H   And  . albuterol  2.5 mg Nebulization Q6H  . amiodarone  200 mg Oral Daily  . antiseptic oral rinse  15 mL Mouth Rinse BID  . azithromycin  500 mg Oral Daily  . brimonidine  1 drop Both Eyes Q8H  . cefTRIAXone (ROCEPHIN)  IV  1 g Intravenous Q24H  . docusate sodium  100 mg Oral BID  . dorzolamide  1 drop Both Eyes BID  . furosemide  80 mg Oral BID  . gabapentin  300 mg Oral TID  . guaiFENesin  600 mg Oral BID  . latanoprost  1 drop Both Eyes QHS  . levothyroxine  100 mcg Oral QAC breakfast  . metoprolol  50 mg Oral BH-q7a  . nystatin  5 mL Oral QID  . polyethylene glycol  17 g Oral Daily  .  potassium chloride SA  40 mEq Oral Daily  . predniSONE  60 mg Oral Q breakfast  . simvastatin  20 mg Oral q1800  . sodium chloride  3 mL Intravenous Q12H  . trimethoprim  100 mg Oral QHS  . warfarin  2 mg Oral Once  . Warfarin - Pharmacist Dosing Inpatient   Does not apply Q24H   Continuous Infusions:   Time Spent: 35 minutes  Chaya Jan, MD  [facility] Pager [phone]  If  7PM-7AM, please contact night-coverage [email or web address] Password [email or web address] 08/09/2013, 2:56 PM   LOS: 6 days

## 2013-08-10 LAB — PROTIME-INR
INR: 2.14 — ABNORMAL HIGH (ref 0.00–1.49)
Prothrombin Time: 23.2 seconds — ABNORMAL HIGH (ref 11.6–15.2)

## 2013-08-10 LAB — BASIC METABOLIC PANEL
CO2: 29 mEq/L (ref 19–32)
Chloride: 106 mEq/L (ref 96–112)
GFR calc non Af Amer: 39 mL/min — ABNORMAL LOW (ref >90)
Potassium: 3.7 mEq/L (ref 3.5–5.1)
Sodium: 146 mEq/L — ABNORMAL HIGH (ref 135–145)

## 2013-08-10 LAB — CBC
HCT: 37.5 % (ref 36.0–46.0)
MCV: 74.7 fL — ABNORMAL LOW (ref 78.0–100.0)
RBC: 5.02 MIL/uL (ref 3.87–5.11)
WBC: 17 10*3/uL — ABNORMAL HIGH (ref 4.0–10.5)

## 2013-08-10 MED ORDER — WARFARIN SODIUM 2.5 MG PO TABS: 2.5000 mg | ORAL_TABLET | Freq: Once | ORAL | Status: DC

## 2013-08-10 MED ORDER — PREDNISONE 10 MG PO TABS: 10.0000 mg | ORAL_TABLET | Freq: Every day | ORAL | Status: DC

## 2013-08-10 NOTE — Clinical Social Work Note (Signed)
Pt d/c today back to [facility]. Pt, son, and facility aware and agreeable. Son to provide transport. D/C summary and FL2 faxed. Out of facility DNR in packet.  Derenda Fennel, [place] [phone]

## 2013-08-10 NOTE — Progress Notes (Addendum)
ANTICOAGULATION CONSULT NOTE   Pharmacy Consult for Coumadin  Indication: h/o PE & atrial fibrillation  Allergies  Allergen Reactions  . Naproxen     Patient on coumadin  . Robaxin [Methocarbamol]     Causes patient to be confused   Patient Measurements: Height: 5\' 3"  (160 cm) Weight: 151 lb (68.493 kg) IBW/kg (Calculated) : 52.4  Vital Signs: Temp: 97.5 F (36.4 C) (12/08 0617) Temp src: Oral (12/08 0617) BP: 132/80 mmHg (12/08 0916) Pulse Rate: 70 (12/08 0617)  Labs:  Recent Labs  08/08/13 0608 08/09/13 0611 08/10/13 0515  HGB 11.2*  --  11.5*  HCT 36.5  --  37.5  PLT 240  --  235  LABPROT 32.5* 26.5* 23.2*  INR 3.32* 2.54* 2.14*  CREATININE 1.33*  --  1.23*   Estimated Creatinine Clearance: 31.6 ml/min (by C-G formula based on Cr of 1.23).  Medical History: Past Medical History  Diagnosis Date  . Hypertension   . High cholesterol   . Arthritis   . Hypothyroidism   . Arrhythmia     pacemaker  . Glaucoma   . Pulmonary embolism 06/04/11  . Coronary artery disease   . Atrial fibrillation   . 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 Anxiety   . COPD (chronic obstructive pulmonary disease)    Medications:  Prescriptions prior to admission  Medication Sig Dispense Refill  . acetaminophen (TYLENOL) 325 MG tablet Take 650 mg by mouth every 6 (six) hours as needed for mild pain or fever.       Marland Kitchen albuterol (PROVENTIL) (2.5 MG/3ML) 0.083% nebulizer solution Take 2.5 mg by nebulization every 6 (six) hours as needed. For shortness of breath      . amiodarone (PACERONE) 200 MG tablet Take 200 mg by mouth daily.       . bimatoprost (LUMIGAN) 0.01 % SOLN Apply 1 drop to eye at bedtime.      . brimonidine (ALPHAGAN P) 0.1 % SOLN Place 1 drop into both eyes 2 (two) times daily.      . Calcium Carbonate-Vitamin D (CALCIUM 600 + D PO) Take 1 tablet by mouth 2 (two) times daily.      Marland Kitchen docusate sodium (COLACE) 100 MG capsule Take 100 mg by mouth 2 (two) times daily.       . dorzolamide (TRUSOPT) 2 % ophthalmic solution Place 1 drop into both eyes 2 (two) times daily.      . furosemide (LASIX) 80 MG tablet Take 1 tablet (80 mg total) by mouth 2 (two) times daily.  60 tablet  0  . gabapentin (NEURONTIN) 300 MG capsule Take 1 capsule (300 mg total) by mouth 3 (three) times daily.  30 capsule  0  . guaiFENesin (MUCINEX) 600 MG 12 hr tablet Take 1 tablet (600 mg total) by mouth 2 (two) times daily.  14 tablet  0  . hydrOXYzine (VISTARIL) 25 MG capsule Take 1 capsule by mouth daily as needed for anxiety.       Marland Kitchen levothyroxine (SYNTHROID, LEVOTHROID) 100 MCG tablet Take 100 mcg by mouth daily.      . metoprolol (LOPRESSOR) 50 MG tablet Take 50 mg by mouth every morning.      . ondansetron (ZOFRAN) 4 MG tablet Take 4 mg by mouth every 6 (six) hours as needed. For nausea      . oxyCODONE-acetaminophen (PERCOCET) 5-325 MG per tablet Take 1 tablet by mouth every 4 (four) hours as needed for pain.  20 tablet  0  . polyethylene glycol powder (GLYCOLAX/MIRALAX) powder Take 17 g by mouth daily.       . potassium chloride SA (K-DUR,KLOR-CON) 20 MEQ tablet Take 2 tablets (40 mEq total) by mouth daily.      . pravastatin (PRAVACHOL) 20 MG tablet Take 1 tablet (20 mg total) by mouth daily.  30 tablet  11  . trimethoprim (TRIMPEX) 100 MG tablet Take 100 mg by mouth at bedtime.      Marland Kitchen warfarin (COUMADIN) 4 MG tablet Take 2-4 mg by mouth daily. Take 1/2 tablet (2mg ) daily except 1 tablet (4mg ) on Mon & Fri.      . Alum & Mag Hydroxide-Simeth (MAGIC MOUTHWASH) SOLN Take 10 mLs by mouth 4 (four) times daily as needed.       Assessment: 79yo female admitted for worsening SOB.  Pt on Coumadin PTA for h/o PE.  Home dose is listed above.  INR is sub-therapeutic on admission, but trended up after dose increases to supra-therapeutic range and Coumadin was held.  INR is now therapeutic.  No bleeding noted.   Pt is also on meds that can potentially interact with Warfarin: Zithromax, Zocor,  Amiodarone, and Trimethoprim   Goal of Therapy:  INR 2-3   Plan:  Coumadin 2.5mg  po today x 1  INR daily  [name] A 08/10/2013,10:33 AM

## 2013-08-10 NOTE — Discharge Summary (Signed)
Physician Discharge Summary  [name]:[id number] DOB: 27-May-1927 DOA: 08/03/2013  PCP: Cassell Smiles., MD  Admit date: 08/03/2013 Discharge date: 08/10/2013  Time spent: 45 minutes  Recommendations for Outpatient Follow-up:  -Will be discharged back to ALF today. -Will need to continue prednisone taper. -INR has been supratherapeutic except for today. Please be mindful of coumadin dosing and follow INRs carefully over the next few days.   Discharge Diagnoses:  Principal Problem:   COPD exacerbation Active Problems:   PAF (paroxysmal atrial fibrillation)   HTN (hypertension)   Hypothyroidism   Chronic diastolic CHF (congestive heart failure)   ARF (acute renal failure)   Leukocytosis, unspecified   CKD (chronic kidney disease) stage 3, GFR 30-59 ml/min   Discharge Condition: Stable and improved  Filed Weights   08/03/13 1435  Weight: 68.493 kg (151 lb)    History of present illness:  [name] is a 79 y.o. female  With a hx of cad, copd, hypothyroid, htn who presents with increasing SOB with wheezing since early this AM. In the ED, pt improved with IV steroids and breathing tx, but still with wheezing and decreased BS. Hospitalist consulted for admission for further evaluation and management.   Hospital Course:   Acute COPD exacerbation  -Wheezing has significantly improved. -Continue prednisone taper on DC. -Has completed 5 days of antibiotics, so no further abx will be needed on DC.  Chronic diastolic CHF  -ProBNP is near the patient's baseline  -Patient's weight is unchanged from when she was discharged on 01/21/2013 (151 lbs)  -Dry weight 151-153 pounds  -Continue home dose of furosemide  -Daily weight  -Jan 19, 2013--EF of 60-65%  -No clinical signs of volume overload at present.   Paroxysmal atrial fibrillation  -Continue amiodarone  -Rate controlled  -Continue warfarin.  -Coumadin dose has been supratherapeutic: please be careful with  coumadin dosing at ALF. -Continue metoprolol.   CKD stage III-IV  -Baseline creatinine 1.3-1.6  -Monitor renal function on furosemide  -Creatinine today is back to baseline at 1.3.   Hypertension  -Controlled  -Continue current antihypertensive regimen   Procedures:  None   Consultations:  None  Discharge Instructions  Discharge Orders   Future Appointments Provider Department Dept Phone   09/14/2013 6:00 AM [facility] Device Remotes [facility] [phone]   Future Orders Complete By Expires   Diet - low sodium heart healthy  As directed    Discontinue IV  As directed    Increase activity slowly  As directed        Medication List         acetaminophen 325 MG tablet  Commonly known as:  TYLENOL  Take 650 mg by mouth every 6 (six) hours as needed for mild pain or fever.     albuterol (2.5 MG/3ML) 0.083% nebulizer solution  Commonly known as:  PROVENTIL  Take 2.5 mg by nebulization every 6 (six) hours as needed. For shortness of breath     amiodarone 200 MG tablet  Commonly known as:  PACERONE  Take 200 mg by mouth daily.     brimonidine 0.1 % Soln  Commonly known as:  ALPHAGAN P  Place 1 drop into both eyes 2 (two) times daily.     CALCIUM 600 + D PO  Take 1 tablet by mouth 2 (two) times daily.     docusate sodium 100 MG capsule  Commonly known as:  COLACE  Take 100 mg by mouth 2 (two) times daily.  dorzolamide 2 % ophthalmic solution  Commonly known as:  TRUSOPT  Place 1 drop into both eyes 2 (two) times daily.     furosemide 80 MG tablet  Commonly known as:  LASIX  Take 1 tablet (80 mg total) by mouth 2 (two) times daily.     gabapentin 300 MG capsule  Commonly known as:  NEURONTIN  Take 1 capsule (300 mg total) by mouth 3 (three) times daily.     guaiFENesin 600 MG 12 hr tablet  Commonly known as:  MUCINEX  Take 1 tablet (600 mg total) by mouth 2 (two) times daily.     hydrOXYzine 25 MG capsule  Commonly known as:  VISTARIL   Take 1 capsule by mouth daily as needed for anxiety.     levothyroxine 100 MCG tablet  Commonly known as:  SYNTHROID, LEVOTHROID  Take 100 mcg by mouth daily.     LUMIGAN 0.01 % Soln  Generic drug:  bimatoprost  Apply 1 drop to eye at bedtime.     magic mouthwash Soln  Take 10 mLs by mouth 4 (four) times daily as needed.     metoprolol 50 MG tablet  Commonly known as:  LOPRESSOR  Take 50 mg by mouth every morning.     ondansetron 4 MG tablet  Commonly known as:  ZOFRAN  Take 4 mg by mouth every 6 (six) hours as needed. For nausea     oxyCODONE-acetaminophen 5-325 MG per tablet  Commonly known as:  PERCOCET  Take 1 tablet by mouth every 4 (four) hours as needed for pain.     polyethylene glycol powder powder  Commonly known as:  GLYCOLAX/MIRALAX  Take 17 g by mouth daily.     potassium chloride SA 20 MEQ tablet  Commonly known as:  K-DUR,KLOR-CON  Take 2 tablets (40 mEq total) by mouth daily.     pravastatin 20 MG tablet  Commonly known as:  PRAVACHOL  Take 1 tablet (20 mg total) by mouth daily.     predniSONE 10 MG tablet  Commonly known as:  DELTASONE  Take 1 tablet (10 mg total) by mouth daily with breakfast. Take 60 mg for 2 days, then decrease by 10 mg every other day until none left.     trimethoprim 100 MG tablet  Commonly known as:  TRIMPEX  Take 100 mg by mouth at bedtime.     warfarin 4 MG tablet  Commonly known as:  COUMADIN  Take 2-4 mg by mouth daily. Take 1/2 tablet (2mg ) daily except 1 tablet (4mg ) on Mon & Fri.       Allergies  Allergen Reactions  . Naproxen     Patient on coumadin  . Robaxin [Methocarbamol]     Causes patient to be confused       Follow-up Information   Follow up with Cassell Smiles., MD. Schedule an appointment as soon as possible for a visit in 10 days.   Specialty:  Internal Medicine   Contact information:   [address] [place] [address] [phone]        The results of  significant diagnostics from this hospitalization (including imaging, microbiology, ancillary and laboratory) are listed below for reference.    Significant Diagnostic Studies: Dg Chest Portable 1 View  08/03/2013   CLINICAL DATA:  Shortness of breath and wheezing.  EXAM: PORTABLE CHEST - 1 VIEW  COMPARISON:  02/23/2013  FINDINGS: There is stable cardiomegaly and appearance of a dual-chamber pacemaker. Lungs  show more prominent interstitial thickening and bronchial thickening compared to multiple prior exams suggestive of a component of interstitial edema/pneumonitis or bronchitis superimposed on chronic disease. No overt pulmonary edema or significant pleural fluid is identified. There is stable parenchymal scarring in the left lung.  IMPRESSION: Suggestion of interstitial/bronchial process superimposed on chronic disease. This may be related to interstitial edema, pneumonitis or bronchitis.   Electronically Signed   By: Irish Lack M.D.   On: 08/03/2013 11:00    Microbiology: Recent Results (from the past 240 hour(s))  MRSA PCR SCREENING     Status: None   Collection Time    08/03/13  2:26 PM      Result Value Range Status   MRSA by PCR NEGATIVE  NEGATIVE Final   Comment:            The GeneXpert MRSA Assay (FDA     approved for NASAL specimens     only), is one component of a     comprehensive MRSA colonization     surveillance program. It is not     intended to diagnose MRSA     infection nor to guide or     monitor treatment for     MRSA infections.  CLOSTRIDIUM DIFFICILE BY PCR     Status: None   Collection Time    08/08/13  2:30 PM      Result Value Range Status   C difficile by pcr NEGATIVE  NEGATIVE Final  CLOSTRIDIUM DIFFICILE BY PCR     Status: Abnormal   Collection Time    08/08/13  3:27 PM      Result Value Range Status   C difficile by pcr   (*) NEGATIVE Final   Value: FORMED STOOL SPECIMEN SUBMITTED.  DOES NOT MEET TESTING CRITERIA, ORDER CREDITED   Comment:   CALLED TO [place],M BY GODFREY,O ON 08/08/13 AT 1548.     Labs: Basic Metabolic Panel:  Recent Labs Lab 08/04/13 0548 08/05/13 0534 08/06/13 0511 08/08/13 0608 08/10/13 0515  NA 142 141 140 143 146*  K 4.6 4.2 4.2 4.2 3.7  CL 106 106 102 99 106  CO2 23 23 24 28 29   GLUCOSE 196* 186* 186* 191* 110*  BUN 32* 38* 49* 49* 43*  CREATININE 1.62* 1.55* 1.74* 1.33* 1.23*  CALCIUM 10.2 10.1 10.3 9.8 9.7   Liver Function Tests:  Recent Labs Lab 08/04/13 0548  AST 18  ALT 11  ALKPHOS 62  BILITOT 0.3  PROT 6.9  ALBUMIN 3.3*   No results found for this basename: LIPASE, AMYLASE,  in the last 168 hours No results found for this basename: AMMONIA,  in the last 168 hours CBC:  Recent Labs Lab 08/04/13 0548 08/06/13 0511 08/08/13 0608 08/10/13 0515  WBC 13.0* 17.9* 12.9* 17.0*  HGB 10.2* 11.8* 11.2* 11.5*  HCT 33.1* 38.5 36.5 37.5  MCV 75.2* 75.8* 74.6* 74.7*  PLT 210 245 240 235   Cardiac Enzymes: No results found for this basename: CKTOTAL, CKMB, CKMBINDEX, TROPONINI,  in the last 168 hours BNP: BNP (last 3 results)  Recent Labs  01/16/13 1745 02/23/13 1603 08/03/13 1042  PROBNP 2857.0* 1586.0* 1912.0*   CBG: No results found for this basename: GLUCAP,  in the last 168 hours     Signed:  Chaya Jan  [facility] Pager: [phone] 08/10/2013, 2:07 PM

## 2013-08-10 NOTE — Discharge Planning (Signed)
Pt and family stated she was ready to go back to [facility].  Pt and family Educated with DC papers and IV was removed.  Pt will be transported to [facility] by family.

## 2013-08-10 NOTE — Discharge Planning (Signed)
Pt O2 taken off and SPO2 levels checked 20 minutes later - Pt SPo2 levels was 92% on RA.  No home O2 needed.

## 2013-08-24 ENCOUNTER — Ambulatory Visit (INDEPENDENT_AMBULATORY_CARE_PROVIDER_SITE_OTHER): Payer: Medicare Other | Admitting: Pharmacist Clinician (PhC)/ Clinical Pharmacy Specialist

## 2013-08-24 DIAGNOSIS — I4891 Unspecified atrial fibrillation: Secondary | ICD-10-CM

## 2013-08-24 DIAGNOSIS — I480 Paroxysmal atrial fibrillation: Secondary | ICD-10-CM

## 2013-08-24 DIAGNOSIS — Z7901 Long term (current) use of anticoagulants: Secondary | ICD-10-CM

## 2013-08-24 LAB — PROTIME-INR: INR: 4.7 — AB (ref <=1.1)

## 2013-09-01 ENCOUNTER — Encounter (HOSPITAL_COMMUNITY): Payer: Self-pay | Admitting: Emergency Medicine

## 2013-09-01 ENCOUNTER — Emergency Department (HOSPITAL_COMMUNITY)
Admission: EM | Admit: 2013-09-01 | Discharge: 2013-09-01 | Disposition: A | Discharge status: Home / Self Care | Payer: Medicare Other | Attending: Emergency Medicine | Admitting: Emergency Medicine

## 2013-09-01 ENCOUNTER — Emergency Department (HOSPITAL_COMMUNITY): Payer: Medicare Other

## 2013-09-01 ENCOUNTER — Ambulatory Visit (INDEPENDENT_AMBULATORY_CARE_PROVIDER_SITE_OTHER): Payer: Medicare Other | Admitting: Pharmacist Clinician (PhC)/ Clinical Pharmacy Specialist

## 2013-09-01 DIAGNOSIS — Z8669 Personal history of other diseases of the nervous system and sense organs: Secondary | ICD-10-CM | POA: Insufficient documentation

## 2013-09-01 DIAGNOSIS — F411 Generalized anxiety disorder: Secondary | ICD-10-CM | POA: Insufficient documentation

## 2013-09-01 DIAGNOSIS — I2510 Atherosclerotic heart disease of native coronary artery without angina pectoris: Secondary | ICD-10-CM | POA: Insufficient documentation

## 2013-09-01 DIAGNOSIS — Z7901 Long term (current) use of anticoagulants: Secondary | ICD-10-CM | POA: Insufficient documentation

## 2013-09-01 DIAGNOSIS — B349 Viral infection, unspecified: Secondary | ICD-10-CM

## 2013-09-01 DIAGNOSIS — B9789 Other viral agents as the cause of diseases classified elsewhere: Secondary | ICD-10-CM | POA: Insufficient documentation

## 2013-09-01 DIAGNOSIS — E7800 Pure hypercholesterolemia, unspecified: Secondary | ICD-10-CM | POA: Insufficient documentation

## 2013-09-01 DIAGNOSIS — I4891 Unspecified atrial fibrillation: Secondary | ICD-10-CM | POA: Insufficient documentation

## 2013-09-01 DIAGNOSIS — Z86711 Personal history of pulmonary embolism: Secondary | ICD-10-CM | POA: Insufficient documentation

## 2013-09-01 DIAGNOSIS — I10 Essential (primary) hypertension: Secondary | ICD-10-CM | POA: Insufficient documentation

## 2013-09-01 DIAGNOSIS — Z79899 Other long term (current) drug therapy: Secondary | ICD-10-CM | POA: Insufficient documentation

## 2013-09-01 DIAGNOSIS — M549 Dorsalgia, unspecified: Secondary | ICD-10-CM | POA: Insufficient documentation

## 2013-09-01 DIAGNOSIS — R52 Pain, unspecified: Secondary | ICD-10-CM | POA: Insufficient documentation

## 2013-09-01 DIAGNOSIS — M129 Arthropathy, unspecified: Secondary | ICD-10-CM | POA: Insufficient documentation

## 2013-09-01 DIAGNOSIS — Z950 Presence of cardiac pacemaker: Secondary | ICD-10-CM | POA: Insufficient documentation

## 2013-09-01 DIAGNOSIS — I480 Paroxysmal atrial fibrillation: Secondary | ICD-10-CM

## 2013-09-01 DIAGNOSIS — E039 Hypothyroidism, unspecified: Secondary | ICD-10-CM | POA: Insufficient documentation

## 2013-09-01 DIAGNOSIS — J441 Chronic obstructive pulmonary disease with (acute) exacerbation: Secondary | ICD-10-CM | POA: Insufficient documentation

## 2013-09-01 LAB — CBC WITH DIFFERENTIAL/PLATELET
Basophils Absolute: 0 10*3/uL (ref 0.0–0.1)
Basophils Relative: 0 % (ref 0–1)
Eosinophils Relative: 4 % (ref 0–5)
HCT: 37.6 % (ref 36.0–46.0)
Hemoglobin: 11.4 g/dL — ABNORMAL LOW (ref 12.0–15.0)
Lymphocytes Relative: 28 % (ref 12–46)
MCH: 23.6 pg — ABNORMAL LOW (ref 26.0–34.0)
MCHC: 30.3 g/dL (ref 30.0–36.0)
MCV: 77.7 fL — ABNORMAL LOW (ref 78.0–100.0)
Neutro Abs: 2.7 10*3/uL (ref 1.7–7.7)
Platelets: 222 10*3/uL (ref 150–400)
RDW: 20.4 % — ABNORMAL HIGH (ref 11.5–15.5)
WBC: 5 10*3/uL (ref 4.0–10.5)

## 2013-09-01 LAB — PRO B NATRIURETIC PEPTIDE: Pro B Natriuretic peptide (BNP): 1793 pg/mL — ABNORMAL HIGH (ref 0–450)

## 2013-09-01 MED ORDER — ALBUTEROL SULFATE (2.5 MG/3ML) 0.083% IN NEBU
5.0000 mg | INHALATION_SOLUTION | Freq: Once | RESPIRATORY_TRACT | Status: AC
  Administered 2013-09-01: 5 mg via RESPIRATORY_TRACT
  Filled 2013-09-01: qty 6

## 2013-09-01 MED ORDER — PREDNISONE 20 MG PO TABS: ORAL_TABLET | ORAL | Status: DC

## 2013-09-01 MED ORDER — METHYLPREDNISOLONE SODIUM SUCC 125 MG IJ SOLR
125.0000 mg | Freq: Once | INTRAMUSCULAR | Status: AC
  Administered 2013-09-01: 125 mg via INTRAVENOUS
  Filled 2013-09-01: qty 2

## 2013-09-01 MED ORDER — IPRATROPIUM BROMIDE 0.02 % IN SOLN
0.5000 mg | Freq: Once | RESPIRATORY_TRACT | Status: AC
  Administered 2013-09-01: 0.5 mg via RESPIRATORY_TRACT
  Filled 2013-09-01: qty 2.5

## 2013-09-01 NOTE — ED Provider Notes (Signed)
CSN: [id number]     Arrival date & time 09/01/13  1059 History  This chart was scribed for [name], MD by Dorothey Baseman, ED Scribe. This patient was seen in room APA10/APA10 and the patient's care was started at 1:24 PM.    Chief Complaint  Patient presents with  . Cough  . Generalized Body Aches   The history is provided by a relative and the patient (SON). No language interpreter was used.   HPI Comments: [name] is a 79 y.o. female with a history of PE, CAD, and COPD brought in by EMS who presents to the Emergency Department complaining of "choking" sensation with associated dry cough and diffuse back pain onset about a week ago that began progressively worsening yesterday. Her son reports that the patient received 2 breathing treatments last night and 1 today at [facility] without relief, but that she has not received a chest x-ray. He reports that the patient had a fever yesterday (patient is afebrile at 98.3 in the ED). Patient also has a history of HTN, hypercholesterolemia, and osteoporosis.   Past Medical History  Diagnosis Date  . Hypertension   . High cholesterol   . Arthritis   . Hypothyroidism   . Arrhythmia     pacemaker  . Glaucoma   . Pulmonary embolism 06/04/11  . Coronary artery disease   . Atrial fibrillation   . 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 Anxiety   . COPD (chronic obstructive pulmonary disease)    Past Surgical History  Procedure Laterality Date  . Total hip arthroplasty      right side  . Pacemaker insertion    . Appendectomy    . Abdominal hysterectomy     Family History  Problem Relation Age of Onset  . Heart attack Mother   . Cancer Father     stomach  . Stroke Sister   . Stroke Brother    History  Substance Use Topics  . Smoking status: Never Smoker   . Smokeless tobacco: Never Used  . Alcohol Use: No   OB History   Grav Para Term Preterm Abortions TAB SAB Ect Mult Living                 Review of  Systems  A complete 10 system review of systems was obtained and all systems are negative except as noted in the HPI and PMH.   Allergies  Naproxen and Robaxin  Home Medications   Current Outpatient Rx  Name  Route  Sig  Dispense  Refill  . acetaminophen (TYLENOL) 325 MG tablet   Oral   Take 650 mg by mouth every 6 (six) hours as needed for mild pain or fever.          Marland Kitchen albuterol (PROVENTIL) (2.5 MG/3ML) 0.083% nebulizer solution   Nebulization   Take 2.5 mg by nebulization every 6 (six) hours as needed. For shortness of breath         . Alum & Mag Hydroxide-Simeth (MAGIC MOUTHWASH) SOLN   Oral   Take 10 mLs by mouth 4 (four) times daily as needed.         Marland Kitchen amiodarone (PACERONE) 200 MG tablet   Oral   Take 200 mg by mouth daily.          . bimatoprost (LUMIGAN) 0.01 % SOLN   Ophthalmic   Apply 1 drop to eye at bedtime.         Marland Kitchen  brimonidine (ALPHAGAN P) 0.1 % SOLN   Both Eyes   Place 1 drop into both eyes 2 (two) times daily.         . Calcium Carbonate-Vitamin D (CALCIUM 600 + D PO)   Oral   Take 1 tablet by mouth 2 (two) times daily.         Marland Kitchen docusate sodium (COLACE) 100 MG capsule   Oral   Take 100 mg by mouth 2 (two) times daily.         . dorzolamide (TRUSOPT) 2 % ophthalmic solution   Both Eyes   Place 1 drop into both eyes 2 (two) times daily.         . furosemide (LASIX) 80 MG tablet   Oral   Take 1 tablet (80 mg total) by mouth 2 (two) times daily.   60 tablet   0   . gabapentin (NEURONTIN) 300 MG capsule   Oral   Take 1 capsule (300 mg total) by mouth 3 (three) times daily.   30 capsule   0   . guaiFENesin (MUCINEX) 600 MG 12 hr tablet   Oral   Take 1 tablet (600 mg total) by mouth 2 (two) times daily.   14 tablet   0   . hydrOXYzine (VISTARIL) 25 MG capsule   Oral   Take 1 capsule by mouth daily as needed for anxiety.          Marland Kitchen levothyroxine (SYNTHROID, LEVOTHROID) 100 MCG tablet   Oral   Take 100 mcg by mouth  daily.         . metoprolol (LOPRESSOR) 50 MG tablet   Oral   Take 50 mg by mouth every morning.         . ondansetron (ZOFRAN) 4 MG tablet   Oral   Take 4 mg by mouth every 6 (six) hours as needed. For nausea         . oxyCODONE-acetaminophen (PERCOCET) 5-325 MG per tablet   Oral   Take 1 tablet by mouth every 4 (four) hours as needed for pain.   20 tablet   0   . polyethylene glycol powder (GLYCOLAX/MIRALAX) powder   Oral   Take 17 g by mouth daily.          . potassium chloride SA (K-DUR,KLOR-CON) 20 MEQ tablet   Oral   Take 2 tablets (40 mEq total) by mouth daily.         . pravastatin (PRAVACHOL) 20 MG tablet   Oral   Take 1 tablet (20 mg total) by mouth daily.   30 tablet   11   . tiotropium (SPIRIVA) 18 MCG inhalation capsule   Inhalation   Place 18 mcg into inhaler and inhale daily.         Marland Kitchen trimethoprim (TRIMPEX) 100 MG tablet   Oral   Take 100 mg by mouth at bedtime.         Marland Kitchen warfarin (COUMADIN) 4 MG tablet   Oral   Take 2-4 mg by mouth daily. Take 1/2 tablet (2mg ) daily except 1 tablet (4mg ) on Mon & Fri.         . predniSONE (DELTASONE) 10 MG tablet   Oral   Take 1 tablet (10 mg total) by mouth daily with breakfast. Take 60 mg for 2 days, then decrease by 10 mg every other day until none left.   42 tablet   0    Triage Vitals: BP 106/59  Pulse  73  Temp(Src) 98.3 F (36.8 C) (Oral)  Resp 20  Ht 5\' 3"  (1.6 m)  Wt 154 lb (69.854 kg)  BMI 27.29 kg/m2  SpO2 97%  Physical Exam  Nursing note and vitals reviewed. Constitutional: She is oriented to person, place, and time. She appears well-developed and well-nourished. No distress.  HENT:  Head: Normocephalic and atraumatic.  Eyes: Conjunctivae are normal.  Neck: Normal range of motion. Neck supple.  Cardiovascular: Normal rate, regular rhythm and normal heart sounds.   Pulmonary/Chest: Effort normal and breath sounds normal. No respiratory distress.  Slightly tachypenic.  Minimal wheezes.  Abdominal: She exhibits no distension.  Musculoskeletal: Normal range of motion. She exhibits tenderness.  Tender to right-sided back area.   Neurological: She is alert and oriented to person, place, and time.  Skin: Skin is warm and dry.  Psychiatric: She has a normal mood and affect. Her behavior is normal.    ED Course  Procedures (including critical care time)  DIAGNOSTIC STUDIES: Oxygen Saturation is 97% on room air, normal by my interpretation.    COORDINATION OF CARE: 1:28 PM- Will order a chest x-ray, blood labs, and a breathing treatment. Discussed treatment plan with patient at bedside and patient verbalized agreement.   2:19 PM- Patient states that she does not feel much better after receiving the medications. Will order an additional breathing treatment. Discussed that x-ray results were normal. Discussed treatment plan with patient at bedside and patient verbalized agreement.    Labs Review Labs Reviewed  CBC WITH DIFFERENTIAL - Abnormal; Notable for the following:    Hemoglobin 11.4 (*)    MCV 77.7 (*)    MCH 23.6 (*)    RDW 20.4 (*)    Monocytes Relative 15 (*)    All other components within normal limits  PRO B NATRIURETIC PEPTIDE - Abnormal; Notable for the following:    Pro B Natriuretic peptide (BNP) 1793.0 (*)    All other components within normal limits  BASIC METABOLIC PANEL   Imaging Review Dg Chest Port 1 View  09/01/2013   CLINICAL DATA:  79 year old female with cough and body aches.  EXAM: PORTABLE CHEST - 1 VIEW  COMPARISON:  08/03/2013  FINDINGS: Cardiomegaly and mild pulmonary vascular congestion noted.  A left-sided pacemaker is unchanged.  Subsegmental left mid-lower lung scarring again noted.  There is no evidence of focal airspace disease, pulmonary edema, suspicious pulmonary nodule/mass, pleural effusion, or pneumothorax. No acute bony abnormalities are identified.  IMPRESSION: Cardiomegaly with mild pulmonary vascular  congestion.   Electronically Signed   By: Laveda Abbe M.D.   On: 09/01/2013 14:02    EKG Interpretation   None       MDM  No diagnosis found. Patient is hemodynamically stable. She has pain medication at home.   Rx prednisone for 3 days.  Increase nebulizer treatments to every 3 hours  I personally performed the services described in this documentation, which was scribed in my presence. The recorded information has been reviewed and is accurate.     [name], MD 09/01/13 [phone]

## 2013-09-01 NOTE — ED Notes (Signed)
Pt was here 21/1 for COPD, pt states she got better, but cough never completely went away

## 2013-09-01 NOTE — ED Notes (Signed)
[employer] called for report

## 2013-09-01 NOTE — ED Notes (Signed)
Patient given discharge instruction, verbalized understand. IV removed, band aid applied. Patient wheelchair out of the department.  

## 2013-09-01 NOTE — ED Notes (Signed)
Pt arrives EMS from [facility], pt states she has been feeling bad x 1 week. Complains of body aches, cough. Pt has pace maker for aFib., hx of CH, pt has edema lower extremities bilaterally

## 2013-09-07 LAB — PROTIME-INR: INR: 2.8 — AB (ref <=1.1)

## 2013-09-08 ENCOUNTER — Ambulatory Visit (INDEPENDENT_AMBULATORY_CARE_PROVIDER_SITE_OTHER): Payer: Medicare Other | Admitting: Pharmacist Clinician (PhC)/ Clinical Pharmacy Specialist

## 2013-09-08 DIAGNOSIS — Z7901 Long term (current) use of anticoagulants: Secondary | ICD-10-CM

## 2013-09-08 DIAGNOSIS — I4891 Unspecified atrial fibrillation: Secondary | ICD-10-CM

## 2013-09-08 DIAGNOSIS — I480 Paroxysmal atrial fibrillation: Secondary | ICD-10-CM

## 2013-09-09 ENCOUNTER — Emergency Department (HOSPITAL_COMMUNITY): Payer: Medicare Other

## 2013-09-09 ENCOUNTER — Encounter (HOSPITAL_COMMUNITY): Payer: Self-pay | Admitting: Emergency Medicine

## 2013-09-09 ENCOUNTER — Emergency Department (HOSPITAL_COMMUNITY)
Admission: EM | Admit: 2013-09-09 | Discharge: 2013-09-09 | Disposition: A | Discharge status: Home / Self Care | Payer: Medicare Other | Attending: Emergency Medicine | Admitting: Emergency Medicine

## 2013-09-09 ENCOUNTER — Other Ambulatory Visit: Payer: Self-pay

## 2013-09-09 DIAGNOSIS — Z7901 Long term (current) use of anticoagulants: Secondary | ICD-10-CM | POA: Insufficient documentation

## 2013-09-09 DIAGNOSIS — J449 Chronic obstructive pulmonary disease, unspecified: Secondary | ICD-10-CM | POA: Insufficient documentation

## 2013-09-09 DIAGNOSIS — Z79899 Other long term (current) drug therapy: Secondary | ICD-10-CM | POA: Insufficient documentation

## 2013-09-09 DIAGNOSIS — F411 Generalized anxiety disorder: Secondary | ICD-10-CM | POA: Insufficient documentation

## 2013-09-09 DIAGNOSIS — M129 Arthropathy, unspecified: Secondary | ICD-10-CM | POA: Insufficient documentation

## 2013-09-09 DIAGNOSIS — S22000A Wedge compression fracture of unspecified thoracic vertebra, initial encounter for closed fracture: Secondary | ICD-10-CM

## 2013-09-09 DIAGNOSIS — I509 Heart failure, unspecified: Secondary | ICD-10-CM

## 2013-09-09 DIAGNOSIS — Z950 Presence of cardiac pacemaker: Secondary | ICD-10-CM | POA: Insufficient documentation

## 2013-09-09 DIAGNOSIS — E7800 Pure hypercholesterolemia, unspecified: Secondary | ICD-10-CM | POA: Insufficient documentation

## 2013-09-09 DIAGNOSIS — Z86711 Personal history of pulmonary embolism: Secondary | ICD-10-CM | POA: Insufficient documentation

## 2013-09-09 DIAGNOSIS — E039 Hypothyroidism, unspecified: Secondary | ICD-10-CM | POA: Insufficient documentation

## 2013-09-09 DIAGNOSIS — M8448XA Pathological fracture, other site, initial encounter for fracture: Secondary | ICD-10-CM | POA: Insufficient documentation

## 2013-09-09 DIAGNOSIS — I2510 Atherosclerotic heart disease of native coronary artery without angina pectoris: Secondary | ICD-10-CM | POA: Insufficient documentation

## 2013-09-09 DIAGNOSIS — J4489 Other specified chronic obstructive pulmonary disease: Secondary | ICD-10-CM | POA: Insufficient documentation

## 2013-09-09 DIAGNOSIS — I10 Essential (primary) hypertension: Secondary | ICD-10-CM | POA: Insufficient documentation

## 2013-09-09 DIAGNOSIS — H269 Unspecified cataract: Secondary | ICD-10-CM | POA: Insufficient documentation

## 2013-09-09 DIAGNOSIS — I4891 Unspecified atrial fibrillation: Secondary | ICD-10-CM | POA: Insufficient documentation

## 2013-09-09 LAB — BASIC METABOLIC PANEL
BUN: 30 mg/dL — AB (ref 6–23)
CHLORIDE: 102 meq/L (ref 96–112)
CO2: 25 mEq/L (ref 19–32)
Calcium: 9.9 mg/dL (ref 8.4–10.5)
Creatinine, Ser: 1.61 mg/dL — ABNORMAL HIGH (ref 0.50–1.10)
GFR calc Af Amer: 33 mL/min — ABNORMAL LOW (ref >90)
GFR calc non Af Amer: 28 mL/min — ABNORMAL LOW (ref >90)
GLUCOSE: 113 mg/dL — AB (ref 70–99)
Potassium: 4.8 mEq/L (ref 3.7–5.3)
Sodium: 140 mEq/L (ref 137–147)

## 2013-09-09 LAB — CBC WITH DIFFERENTIAL/PLATELET
Basophils Absolute: 0 10*3/uL (ref 0.0–0.1)
Basophils Relative: 0 % (ref 0–1)
Eosinophils Absolute: 0 10*3/uL (ref 0.0–0.7)
Eosinophils Relative: 0 % (ref 0–5)
HEMATOCRIT: 38.2 % (ref 36.0–46.0)
HEMOGLOBIN: 11.5 g/dL — AB (ref 12.0–15.0)
LYMPHS ABS: 1.8 10*3/uL (ref 0.7–4.0)
Lymphocytes Relative: 22 % (ref 12–46)
MCH: 23.5 pg — AB (ref 26.0–34.0)
MCHC: 30.1 g/dL (ref 30.0–36.0)
MCV: 78.1 fL (ref 78.0–100.0)
MONOS PCT: 12 % (ref 3–12)
Monocytes Absolute: 1 10*3/uL (ref 0.1–1.0)
NEUTROS ABS: 5.4 10*3/uL (ref 1.7–7.7)
Neutrophils Relative %: 66 % (ref 43–77)
Platelets: 313 10*3/uL (ref 150–400)
RBC: 4.89 MIL/uL (ref 3.87–5.11)
RDW: 20.3 % — ABNORMAL HIGH (ref 11.5–15.5)
WBC: 8.2 10*3/uL (ref 4.0–10.5)

## 2013-09-09 LAB — TROPONIN I: Troponin I: <0.3 ng/mL (ref <0.30)

## 2013-09-09 MED ORDER — HYDROMORPHONE HCL PF 1 MG/ML IJ SOLN
0.5000 mg | Freq: Once | INTRAMUSCULAR | Status: AC
Start: 2013-09-09 — End: 2013-09-09
  Administered 2013-09-09: 0.5 mg via INTRAVENOUS

## 2013-09-09 MED ORDER — OXYCODONE-ACETAMINOPHEN 5-325 MG PO TABS
1.0000 | ORAL_TABLET | Freq: Once | ORAL | Status: AC
  Administered 2013-09-09: 1 via ORAL
  Filled 2013-09-09: qty 1

## 2013-09-09 MED ORDER — FUROSEMIDE 10 MG/ML IJ SOLN
40.0000 mg | Freq: Once | INTRAMUSCULAR | Status: AC
  Administered 2013-09-09: 40 mg via INTRAVENOUS
  Filled 2013-09-09: qty 4

## 2013-09-09 MED ORDER — OXYCODONE-ACETAMINOPHEN 10-325 MG PO TABS: 1.0000 | ORAL_TABLET | ORAL | Status: DC | PRN

## 2013-09-09 MED ORDER — IOHEXOL 350 MG/ML SOLN
80.0000 mL | Freq: Once | INTRAVENOUS | Status: AC | PRN
  Administered 2013-09-09: 80 mL via INTRAVENOUS

## 2013-09-09 MED ORDER — HYDROMORPHONE HCL PF 1 MG/ML IJ SOLN
INTRAMUSCULAR | Status: AC
  Administered 2013-09-09: 0.5 mg via INTRAVENOUS
  Filled 2013-09-09: qty 1

## 2013-09-09 MED ORDER — HYDROMORPHONE HCL PF 1 MG/ML IJ SOLN
1.0000 mg | Freq: Once | INTRAMUSCULAR | Status: AC
  Administered 2013-09-09: 1 mg via INTRAVENOUS
  Filled 2013-09-09: qty 1

## 2013-09-09 NOTE — ED Notes (Signed)
Pt c/o SOB and bilateral rib pain x1 week. Pt denies chest pain, denies cough. Pt received 2 breathing treatments prior to arrival and she states "it helped a little at first".

## 2013-09-09 NOTE — ED Notes (Signed)
MD at bedside. 

## 2013-09-09 NOTE — ED Notes (Signed)
Husband Elpidio GaleaRobert Munley [phone]

## 2013-09-09 NOTE — ED Notes (Signed)
[name] - [phone]- [phone]  Pt's son.

## 2013-09-09 NOTE — Discharge Instructions (Signed)
Take one extra 80 mg lasix pill in the morning for 2 days.  So you will take 2 pills in the morning and one pill in the evening for two days.  You usually take one pill 2 times a day.   Follow up with dr. Sherwood Gamblerfusco next week.

## 2013-09-09 NOTE — ED Provider Notes (Signed)
CSN: [id number]     Arrival date & time 09/09/13  0840 History   First MD Initiated Contact with Patient 09/09/13 0854 This chart was scribed for Danielle LennertJoseph L Gottlieb Zuercher, MD by Valera CastleSteven Perry, ED Scribe. This patient was seen in room APA18/APA18 and the patient's care was started at 9:04 AM.      Chief Complaint  Patient presents with  . Shortness of Breath    Patient is a 80 y.o. female presenting with shortness of breath. The history is provided by the patient. No language interpreter was used.  Shortness of Breath Duration:  1 week Progression:  Worsening Chronicity:  Recurrent Context comment:  H/o COPD Associated symptoms: no abdominal pain, no chest pain, no cough, no headaches and no rash   Associated symptoms comment:  Positive for bilateral rib pain. Risk factors: hx of PE/DVT    HPI Comments: [name] is a 80 y.o. female with h/o COPD, PE, and multiple ED visits, who presents to the Emergency Department complaining of SOB, with associated bilateral rib pain, onset 1 week ago. She states she is extremely sore to the point where she needs assistance getting up from a seated position. She reports receiving 2 breathing treatments PTA, with some temporary relief. She denies chest pain, cough, and any other associated symptoms.    PCP - Cassell SmilesFUSCO,LAWRENCE J., MD  Past Medical History  Diagnosis Date  . Hypertension   . High cholesterol   . Arthritis   . Hypothyroidism   . Arrhythmia     pacemaker  . Glaucoma   . Pulmonary embolism 06/04/11  . Coronary artery disease   . Atrial fibrillation   . 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 Anxiety   . COPD (chronic obstructive pulmonary disease)    Past Surgical History  Procedure Laterality Date  . Total hip arthroplasty      right side  . Pacemaker insertion    . Appendectomy    . Abdominal hysterectomy     Family History  Problem Relation Age of Onset  . Heart attack Mother   . Cancer Father     stomach  . Stroke  Sister   . Stroke Brother    History  Substance Use Topics  . Smoking status: Never Smoker   . Smokeless tobacco: Never Used  . Alcohol Use: No   OB History   Grav Para Term Preterm Abortions TAB SAB Ect Mult Living                 Review of Systems  Constitutional: Negative for appetite change and fatigue.  HENT: Negative for congestion, ear discharge and sinus pressure.   Eyes: Negative for discharge.  Respiratory: Positive for shortness of breath. Negative for cough.   Cardiovascular: Negative for chest pain.  Gastrointestinal: Negative for abdominal pain and diarrhea.  Genitourinary: Negative for frequency and hematuria.  Musculoskeletal: Positive for arthralgias (bilateral rib). Negative for back pain.  Skin: Negative for rash.  Neurological: Negative for seizures and headaches.  Psychiatric/Behavioral: Negative for hallucinations.    Allergies  Naproxen; Sulfa antibiotics; and Robaxin  Home Medications   Current Outpatient Rx  Name  Route  Sig  Dispense  Refill  . albuterol (PROVENTIL) (2.5 MG/3ML) 0.083% nebulizer solution   Nebulization   Take 2.5 mg by nebulization every 6 (six) hours as needed. For shortness of breath         . amiodarone (PACERONE) 200 MG tablet   Oral   Take  200 mg by mouth daily.          . bimatoprost (LUMIGAN) 0.01 % SOLN   Both Eyes   Place 1 drop into both eyes at bedtime.          . brimonidine (ALPHAGAN P) 0.1 % SOLN   Both Eyes   Place 1 drop into both eyes 2 (two) times daily.         . Calcium Carbonate-Vitamin D (CALCIUM 600 + D PO)   Oral   Take 1 tablet by mouth 2 (two) times daily.         Marland Kitchen docusate sodium (COLACE) 100 MG capsule   Oral   Take 100 mg by mouth 2 (two) times daily.         . dorzolamide (TRUSOPT) 2 % ophthalmic solution   Both Eyes   Place 1 drop into both eyes 2 (two) times daily.         . furosemide (LASIX) 80 MG tablet   Oral   Take 1 tablet (80 mg total) by mouth 2 (two)  times daily.   60 tablet   0   . gabapentin (NEURONTIN) 300 MG capsule   Oral   Take 1 capsule (300 mg total) by mouth 3 (three) times daily.   30 capsule   0   . guaiFENesin (MUCINEX) 600 MG 12 hr tablet   Oral   Take 1 tablet (600 mg total) by mouth 2 (two) times daily.   14 tablet   0   . levothyroxine (SYNTHROID, LEVOTHROID) 100 MCG tablet   Oral   Take 100 mcg by mouth daily.         . metoprolol (LOPRESSOR) 50 MG tablet   Oral   Take 50 mg by mouth every morning.         Marland Kitchen oxyCODONE-acetaminophen (PERCOCET/ROXICET) 5-325 MG per tablet   Oral   Take 1 tablet by mouth every 6 (six) hours as needed.         . polyethylene glycol powder (GLYCOLAX/MIRALAX) powder   Oral   Take 17 g by mouth daily.          . potassium chloride SA (K-DUR,KLOR-CON) 20 MEQ tablet   Oral   Take 2 tablets (40 mEq total) by mouth daily.         . pravastatin (PRAVACHOL) 20 MG tablet   Oral   Take 1 tablet (20 mg total) by mouth daily.   30 tablet   11   . tiotropium (SPIRIVA) 18 MCG inhalation capsule   Inhalation   Place 18 mcg into inhaler and inhale daily.         Marland Kitchen trimethoprim (TRIMPEX) 100 MG tablet   Oral   Take 100 mg by mouth at bedtime.         Marland Kitchen warfarin (COUMADIN) 4 MG tablet   Oral   Take 2-4 mg by mouth daily. Take 1/2 tablet (2mg ) daily except 1 tablet (4mg ) on Mon & Fri.         Marland Kitchen acetaminophen (TYLENOL) 325 MG tablet   Oral   Take 650 mg by mouth every 6 (six) hours as needed for mild pain or fever.          . Alum & Mag Hydroxide-Simeth (MAGIC MOUTHWASH) SOLN   Oral   Take 10 mLs by mouth 4 (four) times daily as needed.         . hydrOXYzine (VISTARIL) 25 MG capsule  Oral   Take 1 capsule by mouth daily as needed for anxiety.          . ondansetron (ZOFRAN) 4 MG tablet   Oral   Take 4 mg by mouth every 6 (six) hours as needed. For nausea          BP 128/64  Pulse 94  Temp(Src) 97.7 F (36.5 C) (Oral)  SpO2 98%  Physical  Exam  Constitutional: She is oriented to person, place, and time. She appears well-developed.  HENT:  Head: Normocephalic.  Eyes: Conjunctivae and EOM are normal. No scleral icterus.  Neck: Neck supple. No thyromegaly present.  Cardiovascular: Normal rate and regular rhythm.  Exam reveals no gallop and no friction rub.   No murmur heard. Pulmonary/Chest: Effort normal. No stridor. She has no wheezes. She has no rales. She exhibits tenderness (Tenderness over right lateral ribs.).  Abdominal: She exhibits no distension. There is no tenderness. There is no rebound.  Musculoskeletal: Normal range of motion. She exhibits no edema.  Lymphadenopathy:    She has no cervical adenopathy.  Neurological: She is oriented to person, place, and time. She exhibits normal muscle tone. Coordination normal.  Skin: No rash noted. No erythema.  Psychiatric: She has a normal mood and affect. Her behavior is normal.    ED Course  Procedures (including critical care time)  DIAGNOSTIC STUDIES: Oxygen Saturation is 98% on room air, normal by my interpretation.    COORDINATION OF CARE: 9:12 AM-Discussed treatment plan which includes EKG, Oxycodone, DG ribs unilateral right with pt at bedside and pt agreed to plan.    Labs Review Labs Reviewed - No data to display Imaging Review Dg Ribs Unilateral W/chest Right  09/09/2013   CLINICAL DATA:  Rib pain.  EXAM: RIGHT RIBS AND CHEST - 3+ VIEW  COMPARISON:  Single view of the chest 09/01/2013.  FINDINGS: There is cardiomegaly without edema. Pacing device in place. Lungs are clear. No pneumothorax or pleural effusion. Although visualization of the ribs is somewhat limited due to the patient's size. No acute right fracture is identified. Remote right 6th rib fracture is noted. Postoperative change of vertebral augmentation at the thoracolumbar junction is noted.  IMPRESSION: No acute finding.   Electronically Signed   By: Drusilla Kanner M.D.   On: 09/09/2013 09:35     EKG Interpretation   None      Meds ordered this encounter  Medications  . oxyCODONE-acetaminophen (PERCOCET/ROXICET) 5-325 MG per tablet 1 tablet    Sig:   . oxyCODONE-acetaminophen (PERCOCET/ROXICET) 5-325 MG per tablet    Sig: Take 1 tablet by mouth every 6 (six) hours as needed.    MDM  t6 compression causing pain,    Chf.  Will tx with percocet and increase lasix  Danielle Lennert, MD 09/09/13 1546

## 2013-09-13 ENCOUNTER — Encounter (HOSPITAL_COMMUNITY): Payer: Self-pay | Admitting: Emergency Medicine

## 2013-09-13 ENCOUNTER — Emergency Department (HOSPITAL_COMMUNITY)
Admission: EM | Admit: 2013-09-13 | Discharge: 2013-09-13 | Disposition: A | Discharge status: Home / Self Care | Payer: Medicare Other | Attending: Emergency Medicine | Admitting: Emergency Medicine

## 2013-09-13 DIAGNOSIS — Z86711 Personal history of pulmonary embolism: Secondary | ICD-10-CM | POA: Insufficient documentation

## 2013-09-13 DIAGNOSIS — M129 Arthropathy, unspecified: Secondary | ICD-10-CM | POA: Insufficient documentation

## 2013-09-13 DIAGNOSIS — H409 Unspecified glaucoma: Secondary | ICD-10-CM | POA: Insufficient documentation

## 2013-09-13 DIAGNOSIS — I10 Essential (primary) hypertension: Secondary | ICD-10-CM | POA: Insufficient documentation

## 2013-09-13 DIAGNOSIS — Z7901 Long term (current) use of anticoagulants: Secondary | ICD-10-CM | POA: Insufficient documentation

## 2013-09-13 DIAGNOSIS — Z950 Presence of cardiac pacemaker: Secondary | ICD-10-CM | POA: Insufficient documentation

## 2013-09-13 DIAGNOSIS — R52 Pain, unspecified: Secondary | ICD-10-CM | POA: Insufficient documentation

## 2013-09-13 DIAGNOSIS — E7800 Pure hypercholesterolemia, unspecified: Secondary | ICD-10-CM | POA: Insufficient documentation

## 2013-09-13 DIAGNOSIS — J449 Chronic obstructive pulmonary disease, unspecified: Secondary | ICD-10-CM | POA: Insufficient documentation

## 2013-09-13 DIAGNOSIS — M4854XS Collapsed vertebra, not elsewhere classified, thoracic region, sequela of fracture: Secondary | ICD-10-CM

## 2013-09-13 DIAGNOSIS — I2510 Atherosclerotic heart disease of native coronary artery without angina pectoris: Secondary | ICD-10-CM | POA: Insufficient documentation

## 2013-09-13 DIAGNOSIS — M546 Pain in thoracic spine: Secondary | ICD-10-CM | POA: Insufficient documentation

## 2013-09-13 DIAGNOSIS — M810 Age-related osteoporosis without current pathological fracture: Secondary | ICD-10-CM | POA: Insufficient documentation

## 2013-09-13 DIAGNOSIS — F411 Generalized anxiety disorder: Secondary | ICD-10-CM | POA: Insufficient documentation

## 2013-09-13 DIAGNOSIS — Z79899 Other long term (current) drug therapy: Secondary | ICD-10-CM | POA: Insufficient documentation

## 2013-09-13 DIAGNOSIS — J4489 Other specified chronic obstructive pulmonary disease: Secondary | ICD-10-CM | POA: Insufficient documentation

## 2013-09-13 DIAGNOSIS — E039 Hypothyroidism, unspecified: Secondary | ICD-10-CM | POA: Insufficient documentation

## 2013-09-13 DIAGNOSIS — I4891 Unspecified atrial fibrillation: Secondary | ICD-10-CM | POA: Insufficient documentation

## 2013-09-13 MED ORDER — OXYCODONE-ACETAMINOPHEN 5-325 MG PO TABS
2.0000 | ORAL_TABLET | Freq: Once | ORAL | Status: AC
  Administered 2013-09-13: 1 via ORAL
  Filled 2013-09-13: qty 2

## 2013-09-13 MED ORDER — OXYCODONE-ACETAMINOPHEN 5-325 MG PO TABS: 1.0000 | ORAL_TABLET | Freq: Once | ORAL | Status: DC

## 2013-09-13 MED ORDER — OXYCODONE-ACETAMINOPHEN 10-325 MG PO TABS: 1.0000 | ORAL_TABLET | ORAL | Status: DC | PRN

## 2013-09-13 NOTE — ED Provider Notes (Signed)
CSN: [id number]     Arrival date & time 09/13/13  [id number] History   First MD Initiated Contact with Patient 09/13/13 0347     Chief Complaint  Patient presents with  . Back Pain   (Consider location/radiation/quality/duration/timing/severity/associated sxs/prior Treatment) Patient is a 80 y.o. female presenting with back pain. The history is provided by the patient.  Back Pain She had been in the emergency department 4 days ago and she says she was told she had a cracked rib and was sent home with a prescription for pain medication. She states that the pain medication was ordered every 3 hours but her caregiver thought that that was too often associated has been getting up less frequently. She thinks it's been 8 hours since her last dose of the pain medication. When she takes it, it does give her some relief. Pain is severe and she rates it 10/10. It is worse with movement. Denies fever or chills. She denies nausea or vomiting.  Past Medical History  Diagnosis Date  . Hypertension   . High cholesterol   . Arthritis   . Hypothyroidism   . Arrhythmia     pacemaker  . Glaucoma   . Pulmonary embolism 06/04/11  . Coronary artery disease   . Atrial fibrillation   . 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 Anxiety   . COPD (chronic obstructive pulmonary disease)    Past Surgical History  Procedure Laterality Date  . Total hip arthroplasty      right side  . Pacemaker insertion    . Appendectomy    . Abdominal hysterectomy     Family History  Problem Relation Age of Onset  . Heart attack Mother   . Cancer Father     stomach  . Stroke Sister   . Stroke Brother    History  Substance Use Topics  . Smoking status: Never Smoker   . Smokeless tobacco: Never Used  . Alcohol Use: No   OB History   Grav Para Term Preterm Abortions TAB SAB Ect Mult Living                 Review of Systems  Musculoskeletal: Positive for back pain.  All other systems reviewed and are  negative.    Allergies  Naproxen; Sulfa antibiotics; and Robaxin  Home Medications   Current Outpatient Rx  Name  Route  Sig  Dispense  Refill  . acetaminophen (TYLENOL) 325 MG tablet   Oral   Take 650 mg by mouth every 6 (six) hours as needed for mild pain or fever.          Marland Kitchen. albuterol (PROVENTIL) (2.5 MG/3ML) 0.083% nebulizer solution   Nebulization   Take 2.5 mg by nebulization every 6 (six) hours as needed. For shortness of breath         . Alum & Mag Hydroxide-Simeth (MAGIC MOUTHWASH) SOLN   Oral   Take 10 mLs by mouth 4 (four) times daily as needed.         Marland Kitchen. amiodarone (PACERONE) 200 MG tablet   Oral   Take 200 mg by mouth daily.          . bimatoprost (LUMIGAN) 0.01 % SOLN   Both Eyes   Place 1 drop into both eyes at bedtime.          . brimonidine (ALPHAGAN P) 0.1 % SOLN   Both Eyes   Place 1 drop into both eyes 2 (two) times daily.         .Marland Kitchen  Calcium Carbonate-Vitamin D (CALCIUM 600 + D PO)   Oral   Take 1 tablet by mouth 2 (two) times daily.         Marland Kitchen docusate sodium (COLACE) 100 MG capsule   Oral   Take 100 mg by mouth 2 (two) times daily.         . dorzolamide (TRUSOPT) 2 % ophthalmic solution   Both Eyes   Place 1 drop into both eyes 2 (two) times daily.         . furosemide (LASIX) 80 MG tablet   Oral   Take 1 tablet (80 mg total) by mouth 2 (two) times daily.   60 tablet   0   . gabapentin (NEURONTIN) 300 MG capsule   Oral   Take 1 capsule (300 mg total) by mouth 3 (three) times daily.   30 capsule   0   . guaiFENesin (MUCINEX) 600 MG 12 hr tablet   Oral   Take 1 tablet (600 mg total) by mouth 2 (two) times daily.   14 tablet   0   . hydrOXYzine (VISTARIL) 25 MG capsule   Oral   Take 1 capsule by mouth daily as needed for anxiety.          Marland Kitchen levothyroxine (SYNTHROID, LEVOTHROID) 100 MCG tablet   Oral   Take 100 mcg by mouth daily.         . metoprolol (LOPRESSOR) 50 MG tablet   Oral   Take 50 mg by mouth  every morning.         . ondansetron (ZOFRAN) 4 MG tablet   Oral   Take 4 mg by mouth every 6 (six) hours as needed. For nausea         . oxyCODONE-acetaminophen (PERCOCET) 10-325 MG per tablet   Oral   Take 1 tablet by mouth every 4 (four) hours as needed for pain.   30 tablet   0   . oxyCODONE-acetaminophen (PERCOCET/ROXICET) 5-325 MG per tablet   Oral   Take 1 tablet by mouth every 6 (six) hours as needed.         . polyethylene glycol powder (GLYCOLAX/MIRALAX) powder   Oral   Take 17 g by mouth daily.          . potassium chloride SA (K-DUR,KLOR-CON) 20 MEQ tablet   Oral   Take 2 tablets (40 mEq total) by mouth daily.         . pravastatin (PRAVACHOL) 20 MG tablet   Oral   Take 1 tablet (20 mg total) by mouth daily.   30 tablet   11   . tiotropium (SPIRIVA) 18 MCG inhalation capsule   Inhalation   Place 18 mcg into inhaler and inhale daily.         Marland Kitchen trimethoprim (TRIMPEX) 100 MG tablet   Oral   Take 100 mg by mouth at bedtime.         Marland Kitchen warfarin (COUMADIN) 4 MG tablet   Oral   Take 2-4 mg by mouth daily. Take 1/2 tablet (2mg ) daily except 1 tablet (4mg ) on Mon & Fri.          BP 112/53  Pulse 73  Temp(Src) 97.9 F (36.6 C) (Oral)  Resp 20  Ht 5' 3.5" (1.613 m)  Wt 151 lb (68.493 kg)  BMI 26.33 kg/m2  SpO2 93% Physical Exam  Nursing note and vitals reviewed.  80 year old female, who appears uncomfortable, but is in no  acute distress. Vital signs are normal. Oxygen saturation is 93%, which is normal. Head is normocephalic and atraumatic. PERRLA, EOMI. Oropharynx is clear. Neck is nontender and supple without adenopathy or JVD. Back is very tender in the mid and mid-lower thoracic region. Lungs are clear without rales, wheezes, or rhonchi. Chest is nontender. Heart has regular rate and rhythm without murmur. Abdomen is soft, flat, nontender without masses or hepatosplenomegaly and peristalsis is normoactive. Extremities have no cyanosis  or edema, full range of motion is present. Skin is warm and dry without rash. Neurologic: Mental status is normal, cranial nerves are intact, there are no motor or sensory deficits.  ED Course  Procedures (including critical care time)  MDM   1. Compression fracture of thoracic vertebra, non-traumatic, sequela    Back pain consistent with compression fracture of thoracic vertebra. Old records reviewed, and when she was seen in the ED 4 days ago, she had a CT angiogram which did show acute or subacute compression fracture of T6, and progression of chronic compression of T10. Her presentation is consistent with the known compression fracture and I do not feel any further workup is needed. She's given a dose of Percocet in the emergency department. If this is not sufficient, she may need to be given any more potent narcotic analgesics. Decision about possible kyphoplasty needs to be made by her PCP.  She got good relief of pain. She is advised that she should take her pain medication as needed - even if caregiver is worried about how often it is given. She is given a new prescription for oxycodone acetaminophen.  Dione Booze, MD 09/13/13 [phone]

## 2013-09-13 NOTE — Discharge Instructions (Signed)
Your pain is from a collapsed vertebra in your spine. Please talk with your doctor about whether you would benefit from injecting cement in that vertebra (vertebroplasty). Take the pain medication as needed.  Back, Compression Fracture A compression fracture happens when a force is put upon the length of your spine. Slipping and falling on your bottom are examples of such a force. When this happens, sometimes the force is great enough to compress the building blocks (vertebral bodies) of your spine. Although this causes a lot of pain, this can usually be treated at home, unless your caregiver feels hospitalization is needed for pain control. Your backbone (spinal column) is made up of 24 main vertebral bodies in addition to the sacrum and coccyx (see illustration). These are held together by tough fibrous tissues (ligaments) and by support of your muscles. Nerve roots pass through the openings between the vertebrae. A sudden wrenching move, injury, or a fall may cause a compression fracture of one of the vertebral bodies. This may result in back pain or spread of pain into the belly (abdomen), the buttocks, and down the leg into the foot. Pain may also be created by muscle spasm alone. Large studies have been undertaken to determine the best possible course of action to help your back following injury and also to prevent future problems. The recommendations are as follows. FOLLOWING A COMPRESSION FRACTURE: Do the following only if advised by your caregiver.   If a back brace has been suggested or provided, wear it as directed.  DO NOT stop wearing the back brace unless instructed by your caregiver.  When allowed to return to regular activities, avoid a sedentary life style. Actively exercise. Sporadic weekend binges of tennis, racquetball, water skiing, may actually aggravate or create problems, especially if you are not in condition for that activity.  Avoid sports requiring sudden body movements  until you are in condition for them. Swimming and walking are safer activities.  Maintain good posture.  Avoid obesity.  If not already done, you should have a DEXA scan. Based on the results, be treated for osteoporosis. FOLLOWING ACUTE (SUDDEN) INJURY:  Only take over-the-counter or prescription medicines for pain, discomfort, or fever as directed by your caregiver.  Use bed rest for only the most extreme acute episode. Prolonged bed rest may aggravate your condition. Ice used for acute conditions is effective. Use a large plastic bag filled with ice. Wrap it in a towel. This also provides excellent pain relief. This may be continuous. Or use it for 30 minutes every 2 hours during acute phase, then as needed. Heat for 30 minutes prior to activities is helpful.  As soon as the acute phase (the time when your back is too painful for you to do normal activities) is over, it is important to resume normal activities and work Arboriculturisthardening programs. Back injuries can cause potentially marked changes in lifestyle. So it is important to attack these problems aggressively.  See your caregiver for continued problems. He or she can help or refer you for appropriate exercises, physical therapy and work hardening if needed.  If you are given narcotic medications for your condition, for the next 24 hours DO NOT:  Drive  Operate machinery or power tools.  Sign legal documents.  DO NOT drink alcohol, take sleeping pills or other medications that may interfere with treatment. If your caregiver has given you a follow-up appointment, it is very important to keep that appointment. Not keeping the appointment could result in a  chronic or permanent injury, pain, and disability. If there is any problem keeping the appointment, you must call back to this facility for assistance.  SEEK IMMEDIATE MEDICAL CARE IF:  You develop numbness, tingling, weakness, or problems with the use of your arms or legs.  You  develop severe back pain not relieved with medications.  You have changes in bowel or bladder control.  You have increasing pain in any areas of the body. Document Released: 08/20/2005 Document Revised: 11/12/2011 Document Reviewed: 03/24/2008 [facility] Patient Information 2014 [place], [place].  Acetaminophen; Oxycodone tablets What is this medicine? ACETAMINOPHEN; OXYCODONE (a set a MEE noe fen; ox i KOE done) is a pain reliever. It is used to treat mild to moderate pain. This medicine may be used for other purposes; ask your health care provider or pharmacist if you have questions. COMMON BRAND NAME(S): Endocet, Magnacet, Narvox, Percocet, Perloxx, Primalev, Primlev, Roxicet, Xolox What should I tell my health care provider before I take this medicine? They need to know if you have any of these conditions: -brain tumor -Crohn's disease, inflammatory bowel disease, or ulcerative colitis -drug abuse or addiction -head injury -heart or circulation problems -if you often drink alcohol -kidney disease or problems going to the bathroom -liver disease -lung disease, asthma, or breathing problems -an unusual or allergic reaction to acetaminophen, oxycodone, other opioid analgesics, other medicines, foods, dyes, or preservatives -pregnant or trying to get pregnant -breast-feeding How should I use this medicine? Take this medicine by mouth with a full glass of water. Follow the directions on the prescription label. Take your medicine at regular intervals. Do not take your medicine more often than directed. Talk to your pediatrician regarding the use of this medicine in children. Special care may be needed. Patients over 30 years old may have a stronger reaction and need a smaller dose. Overdosage: If you think you have taken too much of this medicine contact a poison control center or emergency room at once. NOTE: This medicine is only for you. Do not share this medicine with others. What if  I miss a dose? If you miss a dose, take it as soon as you can. If it is almost time for your next dose, take only that dose. Do not take double or extra doses. What may interact with this medicine? -alcohol -antihistamines -barbiturates like amobarbital, butalbital, butabarbital, methohexital, pentobarbital, phenobarbital, thiopental, and secobarbital -benztropine -drugs for bladder problems like solifenacin, trospium, oxybutynin, tolterodine, hyoscyamine, and methscopolamine -drugs for breathing problems like ipratropium and tiotropium -drugs for certain stomach or intestine problems like propantheline, homatropine methylbromide, glycopyrrolate, atropine, belladonna, and dicyclomine -general anesthetics like etomidate, ketamine, nitrous oxide, propofol, desflurane, enflurane, halothane, isoflurane, and sevoflurane -medicines for depression, anxiety, or psychotic disturbances -medicines for sleep -muscle relaxants -naltrexone -narcotic medicines (opiates) for pain -phenothiazines like perphenazine, thioridazine, chlorpromazine, mesoridazine, fluphenazine, prochlorperazine, promazine, and trifluoperazine -scopolamine -tramadol -trihexyphenidyl This list may not describe all possible interactions. Give your health care provider a list of all the medicines, herbs, non-prescription drugs, or dietary supplements you use. Also tell them if you smoke, drink alcohol, or use illegal drugs. Some items may interact with your medicine. What should I watch for while using this medicine? Tell your doctor or health care professional if your pain does not go away, if it gets worse, or if you have new or a different type of pain. You may develop tolerance to the medicine. Tolerance means that you will need a higher dose of the medication for pain relief. Tolerance  is normal and is expected if you take this medicine for a long time. Do not suddenly stop taking your medicine because you may develop a severe  reaction. Your body becomes used to the medicine. This does NOT mean you are addicted. Addiction is a behavior related to getting and using a drug for a non-medical reason. If you have pain, you have a medical reason to take pain medicine. Your doctor will tell you how much medicine to take. If your doctor wants you to stop the medicine, the dose will be slowly lowered over time to avoid any side effects. You may get drowsy or dizzy. Do not drive, use machinery, or do anything that needs mental alertness until you know how this medicine affects you. Do not stand or sit up quickly, especially if you are an older patient. This reduces the risk of dizzy or fainting spells. Alcohol may interfere with the effect of this medicine. Avoid alcoholic drinks. There are different types of narcotic medicines (opiates) for pain. If you take more than one type at the same time, you may have more side effects. Give your health care provider a list of all medicines you use. Your doctor will tell you how much medicine to take. Do not take more medicine than directed. Call emergency for help if you have problems breathing. The medicine will cause constipation. Try to have a bowel movement at least every 2 to 3 days. If you do not have a bowel movement for 3 days, call your doctor or health care professional. Do not take Tylenol (acetaminophen) or medicines that have acetaminophen with this medicine. Too much acetaminophen can be very dangerous. Many nonprescription medicines contain acetaminophen. Always read the labels carefully to avoid taking more acetaminophen. What side effects may I notice from receiving this medicine? Side effects that you should report to your doctor or health care professional as soon as possible: -allergic reactions like skin rash, itching or hives, swelling of the face, lips, or tongue -breathing difficulties, wheezing -confusion -light headedness or fainting spells -severe stomach  pain -unusually weak or tired -yellowing of the skin or the whites of the eyes  Side effects that usually do not require medical attention (report to your doctor or health care professional if they continue or are bothersome): -dizziness -drowsiness -nausea -vomiting This list may not describe all possible side effects. Call your doctor for medical advice about side effects. You may report side effects to FDA at 1-800-FDA-1088. Where should I keep my medicine? Keep out of the reach of children. This medicine can be abused. Keep your medicine in a safe place to protect it from theft. Do not share this medicine with anyone. Selling or giving away this medicine is dangerous and against the law. Store at room temperature between 20 and 25 degrees C (68 and 77 degrees F). Keep container tightly closed. Protect from light. This medicine may cause accidental overdose and death if it is taken by other adults, children, or pets. Flush any unused medicine down the toilet to reduce the chance of harm. Do not use the medicine after the expiration date. NOTE: This sheet is a summary. It may not cover all possible information. If you have questions about this medicine, talk to your doctor, pharmacist, or health care provider.  2014, [employer]. (2013-04-13 13:17:35)

## 2013-09-13 NOTE — ED Notes (Signed)
MD took her off her pain meds. Having severe back pain at this time.

## 2013-09-15 ENCOUNTER — Emergency Department (HOSPITAL_COMMUNITY): Payer: Medicare Other

## 2013-09-15 ENCOUNTER — Emergency Department (HOSPITAL_COMMUNITY)
Admission: EM | Admit: 2013-09-15 | Discharge: 2013-09-15 | Disposition: A | Discharge status: Home / Self Care | Payer: Medicare Other | Attending: Emergency Medicine | Admitting: Emergency Medicine

## 2013-09-15 ENCOUNTER — Encounter (HOSPITAL_COMMUNITY): Payer: Self-pay | Admitting: Emergency Medicine

## 2013-09-15 DIAGNOSIS — I10 Essential (primary) hypertension: Secondary | ICD-10-CM | POA: Insufficient documentation

## 2013-09-15 DIAGNOSIS — Z792 Long term (current) use of antibiotics: Secondary | ICD-10-CM | POA: Insufficient documentation

## 2013-09-15 DIAGNOSIS — Z79899 Other long term (current) drug therapy: Secondary | ICD-10-CM | POA: Insufficient documentation

## 2013-09-15 DIAGNOSIS — Z950 Presence of cardiac pacemaker: Secondary | ICD-10-CM | POA: Insufficient documentation

## 2013-09-15 DIAGNOSIS — M549 Dorsalgia, unspecified: Secondary | ICD-10-CM | POA: Insufficient documentation

## 2013-09-15 DIAGNOSIS — Z8781 Personal history of (healed) traumatic fracture: Secondary | ICD-10-CM | POA: Insufficient documentation

## 2013-09-15 DIAGNOSIS — J449 Chronic obstructive pulmonary disease, unspecified: Secondary | ICD-10-CM | POA: Insufficient documentation

## 2013-09-15 DIAGNOSIS — F411 Generalized anxiety disorder: Secondary | ICD-10-CM | POA: Insufficient documentation

## 2013-09-15 DIAGNOSIS — H5702 Anisocoria: Secondary | ICD-10-CM

## 2013-09-15 DIAGNOSIS — I2510 Atherosclerotic heart disease of native coronary artery without angina pectoris: Secondary | ICD-10-CM | POA: Insufficient documentation

## 2013-09-15 DIAGNOSIS — R791 Abnormal coagulation profile: Secondary | ICD-10-CM

## 2013-09-15 DIAGNOSIS — J4489 Other specified chronic obstructive pulmonary disease: Secondary | ICD-10-CM | POA: Insufficient documentation

## 2013-09-15 DIAGNOSIS — E7800 Pure hypercholesterolemia, unspecified: Secondary | ICD-10-CM | POA: Insufficient documentation

## 2013-09-15 DIAGNOSIS — Z7901 Long term (current) use of anticoagulants: Secondary | ICD-10-CM | POA: Insufficient documentation

## 2013-09-15 DIAGNOSIS — M818 Other osteoporosis without current pathological fracture: Secondary | ICD-10-CM | POA: Insufficient documentation

## 2013-09-15 DIAGNOSIS — Z86711 Personal history of pulmonary embolism: Secondary | ICD-10-CM | POA: Insufficient documentation

## 2013-09-15 DIAGNOSIS — I4891 Unspecified atrial fibrillation: Secondary | ICD-10-CM | POA: Insufficient documentation

## 2013-09-15 DIAGNOSIS — H409 Unspecified glaucoma: Secondary | ICD-10-CM | POA: Insufficient documentation

## 2013-09-15 DIAGNOSIS — Z9889 Other specified postprocedural states: Secondary | ICD-10-CM | POA: Insufficient documentation

## 2013-09-15 DIAGNOSIS — M129 Arthropathy, unspecified: Secondary | ICD-10-CM | POA: Insufficient documentation

## 2013-09-15 DIAGNOSIS — E039 Hypothyroidism, unspecified: Secondary | ICD-10-CM | POA: Insufficient documentation

## 2013-09-15 HISTORY — DX: Reserved for concepts with insufficient information to code with codable children: IMO0002

## 2013-09-15 LAB — COMPREHENSIVE METABOLIC PANEL
ALK PHOS: 96 U/L (ref 39–117)
ALT: 19 U/L (ref 0–35)
AST: 25 U/L (ref 0–37)
Albumin: 3.4 g/dL — ABNORMAL LOW (ref 3.5–5.2)
BILIRUBIN TOTAL: 0.2 mg/dL — AB (ref 0.3–1.2)
BUN: 41 mg/dL — AB (ref 6–23)
CHLORIDE: 102 meq/L (ref 96–112)
CO2: 27 meq/L (ref 19–32)
Calcium: 10.7 mg/dL — ABNORMAL HIGH (ref 8.4–10.5)
Creatinine, Ser: 2.15 mg/dL — ABNORMAL HIGH (ref 0.50–1.10)
GFR calc Af Amer: 23 mL/min — ABNORMAL LOW (ref >90)
GFR, EST NON AFRICAN AMERICAN: 20 mL/min — AB (ref >90)
Glucose, Bld: 200 mg/dL — ABNORMAL HIGH (ref 70–99)
POTASSIUM: 5.7 meq/L — AB (ref 3.7–5.3)
Sodium: 141 mEq/L (ref 137–147)
Total Protein: 7 g/dL (ref 6.0–8.3)

## 2013-09-15 LAB — CBC WITH DIFFERENTIAL/PLATELET
Basophils Absolute: 0 10*3/uL (ref 0.0–0.1)
Basophils Relative: 0 % (ref 0–1)
Eosinophils Absolute: 0 10*3/uL (ref 0.0–0.7)
Eosinophils Relative: 0 % (ref 0–5)
HEMATOCRIT: 35.4 % — AB (ref 36.0–46.0)
Hemoglobin: 11.1 g/dL — ABNORMAL LOW (ref 12.0–15.0)
LYMPHS ABS: 0.4 10*3/uL — AB (ref 0.7–4.0)
LYMPHS PCT: 3 % — AB (ref 12–46)
MCH: 24.1 pg — ABNORMAL LOW (ref 26.0–34.0)
MCHC: 31.4 g/dL (ref 30.0–36.0)
MCV: 77 fL — ABNORMAL LOW (ref 78.0–100.0)
MONO ABS: 0.9 10*3/uL (ref 0.1–1.0)
MONOS PCT: 7 % (ref 3–12)
NEUTROS ABS: 11.1 10*3/uL — AB (ref 1.7–7.7)
Neutrophils Relative %: 90 % — ABNORMAL HIGH (ref 43–77)
Platelets: 294 10*3/uL (ref 150–400)
RBC: 4.6 MIL/uL (ref 3.87–5.11)
RDW: 20.1 % — ABNORMAL HIGH (ref 11.5–15.5)
WBC: 12.3 10*3/uL — AB (ref 4.0–10.5)

## 2013-09-15 LAB — PROTIME-INR
INR: 8.25 (ref 0.00–1.49)
Prothrombin Time: 65.1 seconds — ABNORMAL HIGH (ref 11.6–15.2)

## 2013-09-15 MED ORDER — VITAMIN K1 10 MG/ML IJ SOLN
10.0000 mg | Freq: Once | INTRAMUSCULAR | Status: AC
  Administered 2013-09-15: 10 mg via SUBCUTANEOUS
  Filled 2013-09-15: qty 1

## 2013-09-15 NOTE — ED Provider Notes (Signed)
CSN: [id number]     Arrival date & time 09/15/13  1429 History   This chart was scribed for Geoffery Lyonsouglas Kimmy Totten, MD, by Yevette EdwardsAngela Bracken, ED Scribe. This patient was seen in room APA11/APA11 and the patient's care was started at 2:54 PM.  First MD Initiated Contact with Patient 09/15/13 1451     Chief Complaint  Patient presents with  . Neurologic Problem   The history is provided by the patient and the spouse. No language interpreter was used.   HPI Comments: [name] is a 80 y.o. Female, with a h/o of HTN,  who presents to the Emergency Department complaining of a neurological issue in which her pupils appear to be unequal and non-reactive to light.  he reports visual difficulty to her right eye stating she is not able to focus well. The pt also reports she has experienced back pain; several days ago she was treated for a vertebral fracture. She denies a headache, neck pain, abdominal pain and appetite changes. She takes coumadin, and her husband reports he was told that her INR was 7.6. She is a non-smoker.   The pt [facility] a [facility].   Past Medical History  Diagnosis Date  . Hypertension   . High cholesterol   . Arthritis   . Hypothyroidism   . Arrhythmia     pacemaker  . Glaucoma   . Pulmonary embolism 06/04/11  . Coronary artery disease   . Atrial fibrillation   . 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 Anxiety   . COPD (chronic obstructive pulmonary disease)   . Compression fracture    Past Surgical History  Procedure Laterality Date  . Total hip arthroplasty      right side  . Pacemaker insertion    . Appendectomy    . Abdominal hysterectomy     Family History  Problem Relation Age of Onset  . Heart attack Mother   . Cancer Father     stomach  . Stroke Sister   . Stroke Brother    History  Substance Use Topics  . Smoking status: Never Smoker   . Smokeless tobacco: Never Used  . Alcohol Use: No   No OB history  provided.  Review of Systems  Constitutional: Negative for appetite change.  Eyes: Positive for visual disturbance.  Gastrointestinal: Negative for abdominal pain.  Musculoskeletal: Positive for back pain. Negative for neck pain.  Neurological: Negative for headaches.  All other systems reviewed and are negative.   Allergies  Naproxen; Sulfa antibiotics; and Robaxin  Home Medications   Current Outpatient Rx  Name  Route  Sig  Dispense  Refill  . acetaminophen (TYLENOL) 325 MG tablet   Oral   Take 650 mg by mouth every 6 (six) hours as needed for mild pain or fever.          Marland Kitchen. albuterol (PROVENTIL) (2.5 MG/3ML) 0.083% nebulizer solution   Nebulization   Take 2.5 mg by nebulization every 6 (six) hours as needed. For shortness of breath         . Alum & Mag Hydroxide-Simeth (MAGIC MOUTHWASH) SOLN   Oral   Take 10 mLs by mouth 4 (four) times daily as needed.         Marland Kitchen. amiodarone (PACERONE) 200 MG tablet   Oral   Take 200 mg by mouth daily.          . bimatoprost (LUMIGAN) 0.01 % SOLN  Both Eyes   Place 1 drop into both eyes at bedtime.          . brimonidine (ALPHAGAN P) 0.1 % SOLN   Both Eyes   Place 1 drop into both eyes 2 (two) times daily.         . Calcium Carbonate-Vitamin D (CALCIUM 600 + D PO)   Oral   Take 1 tablet by mouth 2 (two) times daily.         Marland Kitchen docusate sodium (COLACE) 100 MG capsule   Oral   Take 100 mg by mouth 2 (two) times daily.         . dorzolamide (TRUSOPT) 2 % ophthalmic solution   Both Eyes   Place 1 drop into both eyes 2 (two) times daily.         . furosemide (LASIX) 80 MG tablet   Oral   Take 1 tablet (80 mg total) by mouth 2 (two) times daily.   60 tablet   0   . gabapentin (NEURONTIN) 300 MG capsule   Oral   Take 1 capsule (300 mg total) by mouth 3 (three) times daily.   30 capsule   0   . guaiFENesin (MUCINEX) 600 MG 12 hr tablet   Oral   Take 1 tablet (600 mg total) by mouth 2 (two) times daily.    14 tablet   0   . hydrOXYzine (VISTARIL) 25 MG capsule   Oral   Take 1 capsule by mouth daily as needed for anxiety.          Marland Kitchen levothyroxine (SYNTHROID, LEVOTHROID) 100 MCG tablet   Oral   Take 100 mcg by mouth daily.         . metoprolol (LOPRESSOR) 50 MG tablet   Oral   Take 50 mg by mouth every morning.         . ondansetron (ZOFRAN) 4 MG tablet   Oral   Take 4 mg by mouth every 6 (six) hours as needed. For nausea         . oxyCODONE-acetaminophen (PERCOCET) 10-325 MG per tablet   Oral   Take 1 tablet by mouth every 4 (four) hours as needed for pain.   30 tablet   0   . oxyCODONE-acetaminophen (PERCOCET) 10-325 MG per tablet   Oral   Take 1 tablet by mouth every 4 (four) hours as needed for pain.   30 tablet   0   . oxyCODONE-acetaminophen (PERCOCET/ROXICET) 5-325 MG per tablet   Oral   Take 1 tablet by mouth every 6 (six) hours as needed.         . polyethylene glycol powder (GLYCOLAX/MIRALAX) powder   Oral   Take 17 g by mouth daily.          . potassium chloride SA (K-DUR,KLOR-CON) 20 MEQ tablet   Oral   Take 2 tablets (40 mEq total) by mouth daily.         . pravastatin (PRAVACHOL) 20 MG tablet   Oral   Take 1 tablet (20 mg total) by mouth daily.   30 tablet   11   . tiotropium (SPIRIVA) 18 MCG inhalation capsule   Inhalation   Place 18 mcg into inhaler and inhale daily.         Marland Kitchen trimethoprim (TRIMPEX) 100 MG tablet   Oral   Take 100 mg by mouth at bedtime.         Marland Kitchen warfarin (COUMADIN) 4 MG tablet  Oral   Take 2-4 mg by mouth daily. Take 1/2 tablet (2mg ) daily except 1 tablet (4mg ) on Mon & Fri.          Triage Vitals: BP 141/76  Pulse 91  Temp(Src) 98 F (36.7 C) (Oral)  Resp 32  SpO2 97%  Physical Exam  Nursing note and vitals reviewed. Constitutional: No distress.  Pt is an elderly female in NAD. She is awake and alert. She answers questions appropriately.   HENT:  Head: Normocephalic and atraumatic.   Mouth/Throat: Oropharynx is clear and moist.  Eyes: EOM are normal.  The left pupil is 4 mm and is reactive. The right pupil is 3 mm and reactive.   Neck: Neck supple. No tracheal deviation present.  Cardiovascular: Normal rate.   Pulmonary/Chest: Effort normal. No respiratory distress.  Musculoskeletal: Normal range of motion.  Neurological: She is alert. She displays normal reflexes. No cranial nerve deficit. She exhibits normal muscle tone.  Skin: Skin is warm and dry.  Psychiatric: She has a normal mood and affect. Her behavior is normal.    ED Course  Procedures (including critical care time)  DIAGNOSTIC STUDIES: Oxygen Saturation is 97% on [place], normal by my interpretation.    COORDINATION OF CARE:  3:03 PM- Discussed treatment plan with patient, and the patient agreed to the plan.   Labs Review Labs Reviewed - No data to display Imaging Review No results found.  EKG Interpretation    Date/Time:  Tuesday September 15 2013 17:17:16 EST Ventricular Rate:  75 PR Interval:    QRS Duration: 182 QT Interval:  460 QTC Calculation: 513 R Axis:   -81 Text Interpretation:  Ventricular-paced rhythm Abnormal ECG When compared with ECG of 09-Sep-2013 08:38, Vent. rate has increased BY   2 BPM Confirmed by DELOS  MD, Verne Lanuza ([id number]) on 09/15/2013 5:55:48 PM            MDM  No diagnosis found. Patient is an 80 year old female with history of atrial fibrillation on Coumadin. She was sent here for evaluation today of elevated INR. When she was examined at the extended care facility she was found to have unequal pupils. This raised the concern for a head bleed. Patient is somewhat demented, however denies headache. There are no external signs of trauma. Workup reveals a negative CT of the head, an INR of 8.25, and a mildly elevated potassium. She was given vitamin K and advised to hold her Coumadin for 2 days. An INR is to be rechecked at that time. There is no evidence for active  bleeding and I do not feel as though admission is indicated.   I personally performed the services described in this documentation, which was scribed in my presence. The recorded information has been reviewed and is accurate.     Geoffery Lyons, MD 09/15/13 [phone]

## 2013-09-15 NOTE — ED Notes (Signed)
Staff noted pt with unequal pupils today and reported that they were unreactive to light, pt A&O

## 2013-09-15 NOTE — Discharge Instructions (Signed)
Hold your Coumadin for the next 2 days.  You'll require a repeat INR in 2 days. Your Coumadin dose will be adjusted based on these results by your primary doctor.   Warfarin Coagulopathy Warfarin (Coumadin) coagulopathy refers to bleeding that may occur as a complication of the medicine warfarin. Warfarin is an oral blood thinner (anticoagulant). Warfarin is used for medical conditions where thinning of the blood is needed to prevent blood clots.  CAUSES Bleeding is the most common and most serious complication of warfarin. The amount of bleeding is related to the warfarin dose and length of treatment. In addition, bleeding complications can also occur due to:  Intentional or accidental warfarin overdose.  Underlying medical conditions.  Dietary changes.  Medicine, herbal, supplement, or alcohol interactions. SYMPTOMS Severe bleeding while on warfarin may occur from any tissue or organ. Symptoms of the blood being too thin may include:  Bleeding from the nose or gums.  Blood in bowel movements which may appear as bright red, dark, or black tarry stools.  Blood in the urine which may appear as pink, red, or brown urine.  Unusual bruising or bruising easily.  A cut that does not stop bleeding within 10 minutes.  Vomiting blood or continuous nausea for more than 1 day.  Coughing up blood.  Broken blood vessels in your eye (subconjunctival hemorrhage).  Abdominal or back pain with or without flank bruising.  Sudden, severe headache.  Sudden weakness or numbness of the face, arm, or leg, especially on one side of the body.  Sudden confusion.  Trouble speaking (aphasia) or understanding.  Sudden trouble seeing in one or both eyes.  Sudden trouble walking.  Dizziness.  Loss of balance or coordination.  Vaginal bleeding.  Swelling or pain at an injection site.  Superficial fat tissue death (necrosis) which may cause skin scarring. This is more common in women and  may first present as pain in the waist, thighs, and buttocks.  Fever. HOME CARE INSTRUCTIONS  Always contact your caregiver of any concerns or signs of possible warfarin coagulopathy as soon as possible.  Take warfarin exactly as directed by your caregiver. It is recommended that you take your warfarin dose at the same time of the day. It is preferred that you take warfarin in the late afternoon. If you have been told to stop taking warfarin, do not resume taking warfarin until directed to do so by your caregiver. Follow your caregiver's instructions if you accidentally take an extra dose or miss a dose of warfarin. It is very important to take warfarin as directed since bleeding or blood clots could result in chronic or permanent injury, pain, or disability.  Keep all follow-up appointments with your caregiver as directed. It is very important to keep your appointments. Not keeping appointments could result in a chronic or permanent injury, pain, or disability because warfarin is a medicine that requires close monitoring.  While taking warfarin, you will need to have regular blood tests to measure your blood clotting time. These blood tests usually include both the prothrombin time (PT) and International Normalized Ratio (INR) tests. The PT and INR results allow your caregiver to adjust your dose of warfarin. The dose can change for many reasons. It is critically important that you have your PT and INR levels drawn exactly as directed. PT and INR lab draws are usually done in the morning. Your warfarin dose may stay the same or change depending on what the PT and INR results are. Be sure to  follow up with your caregiver regarding your PT and INR test results and what your warfarin dosage should be.  Many medicines can interfere with warfarin and affect the PT and INR results. You must tell your caregiver about any and all medicines you take, this includes all vitamins and supplements. Ask your  caregiver before taking these. Prescription and over-the-counter medicine consistency is critical to warfarin management. It is important that potential interactions are checked before you start a new medicine. Be especially cautious with aspirin and anti-inflammatory medicines. Ask your caregiver before taking these. Medicines such as antibiotics and acid-reducing medicine can interact with warfarin and can cause an increased warfarin effect. Warfarin can also interfere with the effectiveness of medicines you are taking. Do not take or discontinue any prescribed or over-the-counter medicine except on the advice of your caregiver or pharmacist.  Some vitamins, supplements, and herbal products interfere with the effectiveness of warfarin. Vitamin E may increase the anticoagulant effects of warfarin. Vitamin K may can cause warfarin to be less effective. Do not take or discontinue any vitamin, supplement, or herbal product except on the advice of your caregiver or pharmacist.  Some foods, especially foods high in vitamin K can interfere with the effectiveness of warfarin and affect the PT and INR results. A diet too high in vitamin K can cause warfarin to be less effective. A diet too low in foods containing vitamin K may lead to an excessive warfarin effect. Foods high in vitamin K include spinach, kale, broccoli, cabbage, collard and turnip greens, brussels sprouts, peas, cauliflower, seaweed, and parsley as well as beef and pork liver, green tea, and soybean oil. Eat what you normally eat and keep the vitamin K content of your diet consistent. Avoid major changes in your diet, or notify your caregiver before changing your diet. Arrange a visit with a dietitian to answer your questions.  If you have a loss of appetite or get the stomach flu (viral gastroenteritis), talk to your caregiver as soon as possible. A decrease in your normal vitamin K intake can make you more sensitive to your usual dose of  warfarin.  Some medical conditions may increase your risk for bleeding while you are taking warfarin. A fever, diarrhea lasting more than a day, worsening heart failure, or worsening liver function are some medical conditions that could affect warfarin. Contact your caregiver if you have any of these medical conditions.  Be careful not to cut yourself when using sharp objects or while shaving.  Alcohol can change the body's ability to handle warfarin. It is best to avoid alcoholic drinks or consume only very small amounts while taking warfarin. Notify your caregiver if you change your alcohol intake. A sudden increase in alcohol use can increase your risk of bleeding. Chronic alcohol use can cause warfarin to be less effective.  Limit physical activities or sports that could result in a fall or cause injury.  Do not use warfarin if you are pregnant.  Inform all your caregivers and your dentist that you take warfarin.  Inform all caregivers if you are taking warfarin and aspirin or platelet inhibitor medicines such as clopidogrel, ticagrelor, or prasugrel. Use of these medicines in conjunction with warfarin can increase your risk of bleeding or death. Taking these medicines together should only be done under the direct care of your caregiver. SEEK IMMEDIATE MEDICAL CARE IF:  You cough up blood.  You have dark or black stools or there is bright red blood coming from your rectum.  You vomit blood or have nausea for more than 1 day.  You have blood in the urine or pink colored urine.  You have unusual bruising or have increased bruising.  You have bleeding from the nose or gums that does not stop quickly.  You have a cut that does not stop bleeding within a 2 3 minutes.  You have sudden weakness or numbness of the face, arm, or leg, especially on one side of the body.  You have sudden confusion.  You have trouble speaking (aphasia) or understanding.  You have sudden trouble seeing in  one or both eyes.  You have sudden trouble walking.  You have dizziness.  You have a loss of balance or coordination.  You have a sudden, severe headache.  You have a serious fall or head injury, even if you are not bleeding.  You have swelling or pain at an injection site.  You have unexplained tenderness or pain in the abdomen, back, waist, thighs or buttocks.  You have a fever. Any of these symptoms may represent a serious problem that is an emergency. Do not wait to see if the symptoms will go away. Get medical help right away. Call your local emergency services (911 in [place] Do not drive yourself to the hospital. Document Released: 07/29/2006 Document Revised: 02/19/2012 Document Reviewed: 01/29/2012 [facility] Patient Information 2014 [place], [place].

## 2013-09-15 NOTE — ED Notes (Addendum)
[facility] called by Claris CheMargaret to transport patient back to nursing facility. Patient's husband notified.

## 2013-09-18 ENCOUNTER — Ambulatory Visit (INDEPENDENT_AMBULATORY_CARE_PROVIDER_SITE_OTHER): Payer: Medicare Other | Admitting: Pharmacist Clinician (PhC)/ Clinical Pharmacy Specialist

## 2013-09-18 DIAGNOSIS — I480 Paroxysmal atrial fibrillation: Secondary | ICD-10-CM

## 2013-09-18 DIAGNOSIS — Z7901 Long term (current) use of anticoagulants: Secondary | ICD-10-CM

## 2013-09-18 DIAGNOSIS — I4891 Unspecified atrial fibrillation: Secondary | ICD-10-CM

## 2013-09-18 LAB — POCT INR: INR: 1.3

## 2013-09-18 NOTE — Progress Notes (Signed)
Received a call from Dr. Harvie HeckAnneMarie Mazzocchi regarding Ms. Kolasa.  She is living in assisted living, has recently been on prednisone and multiple antibiotics as well as a couple of falls. They are having trouble getting patient to lab for blood draws.  Dr. Smith MinceMazzocchi would like to take patient off warfarin and consider either NOAC or perhaps just ASA.  Reading Dr. Erin Hearingroitoru's note from October, I suggested that we start with Eliquis 2.5mg  bid as the bleed risk is less than warfain.  Pt is 80 yrs old, and has Scr 2.1.  Pt had INR drawn on 1/13, post antibiotics and prednisone, was 8.25.  Dr states that a fingerstick INR today was 1.3.  Recommended that we start Eliquis tonight and she agreed.  Also she was concerned about whether pt insurance would cover medication and at what cost.  I suggested we start with it and see if covered, then if we have to we can try Xarelto 15mg  qd.  Dr. Smith MinceMazzocchi agreed and will make change for patient.  Will share this info with Dr. Royann Shiversroitoru and advised Dr. Smith MinceMazzocchi to call if she has any other concerns.

## 2013-10-03 ENCOUNTER — Inpatient Hospital Stay (HOSPITAL_COMMUNITY)
Admission: EM | Admit: 2013-10-03 | Discharge: 2013-10-12 | DRG: 190 | Disposition: A | Discharge status: Hospice | Payer: Medicare Other | Attending: Family Medicine | Admitting: Family Medicine

## 2013-10-03 ENCOUNTER — Other Ambulatory Visit: Payer: Self-pay

## 2013-10-03 ENCOUNTER — Encounter (HOSPITAL_COMMUNITY): Payer: Self-pay | Admitting: Emergency Medicine

## 2013-10-03 ENCOUNTER — Emergency Department (HOSPITAL_COMMUNITY): Payer: Medicare Other

## 2013-10-03 DIAGNOSIS — R0603 Acute respiratory distress: Secondary | ICD-10-CM | POA: Diagnosis present

## 2013-10-03 DIAGNOSIS — E039 Hypothyroidism, unspecified: Secondary | ICD-10-CM | POA: Diagnosis present

## 2013-10-03 DIAGNOSIS — I480 Paroxysmal atrial fibrillation: Secondary | ICD-10-CM

## 2013-10-03 DIAGNOSIS — E870 Hyperosmolality and hypernatremia: Secondary | ICD-10-CM | POA: Diagnosis present

## 2013-10-03 DIAGNOSIS — G9340 Encephalopathy, unspecified: Secondary | ICD-10-CM

## 2013-10-03 DIAGNOSIS — G894 Chronic pain syndrome: Secondary | ICD-10-CM | POA: Diagnosis present

## 2013-10-03 DIAGNOSIS — IMO0002 Reserved for concepts with insufficient information to code with codable children: Secondary | ICD-10-CM

## 2013-10-03 DIAGNOSIS — M48061 Spinal stenosis, lumbar region without neurogenic claudication: Secondary | ICD-10-CM | POA: Diagnosis present

## 2013-10-03 DIAGNOSIS — F0390 Unspecified dementia without behavioral disturbance: Secondary | ICD-10-CM | POA: Diagnosis present

## 2013-10-03 DIAGNOSIS — M810 Age-related osteoporosis without current pathological fracture: Secondary | ICD-10-CM | POA: Diagnosis present

## 2013-10-03 DIAGNOSIS — Z96649 Presence of unspecified artificial hip joint: Secondary | ICD-10-CM

## 2013-10-03 DIAGNOSIS — T380X5A Adverse effect of glucocorticoids and synthetic analogues, initial encounter: Secondary | ICD-10-CM | POA: Diagnosis present

## 2013-10-03 DIAGNOSIS — J9620 Acute and chronic respiratory failure, unspecified whether with hypoxia or hypercapnia: Secondary | ICD-10-CM

## 2013-10-03 DIAGNOSIS — I495 Sick sinus syndrome: Secondary | ICD-10-CM | POA: Diagnosis present

## 2013-10-03 DIAGNOSIS — Z79899 Other long term (current) drug therapy: Secondary | ICD-10-CM

## 2013-10-03 DIAGNOSIS — Z7901 Long term (current) use of anticoagulants: Secondary | ICD-10-CM

## 2013-10-03 DIAGNOSIS — R410 Disorientation, unspecified: Secondary | ICD-10-CM

## 2013-10-03 DIAGNOSIS — J984 Other disorders of lung: Secondary | ICD-10-CM

## 2013-10-03 DIAGNOSIS — N183 Chronic kidney disease, stage 3 unspecified: Secondary | ICD-10-CM

## 2013-10-03 DIAGNOSIS — J101 Influenza due to other identified influenza virus with other respiratory manifestations: Secondary | ICD-10-CM

## 2013-10-03 DIAGNOSIS — F411 Generalized anxiety disorder: Secondary | ICD-10-CM | POA: Diagnosis present

## 2013-10-03 DIAGNOSIS — I10 Essential (primary) hypertension: Secondary | ICD-10-CM

## 2013-10-03 DIAGNOSIS — M129 Arthropathy, unspecified: Secondary | ICD-10-CM | POA: Diagnosis present

## 2013-10-03 DIAGNOSIS — Z8249 Family history of ischemic heart disease and other diseases of the circulatory system: Secondary | ICD-10-CM

## 2013-10-03 DIAGNOSIS — Z950 Presence of cardiac pacemaker: Secondary | ICD-10-CM | POA: Diagnosis present

## 2013-10-03 DIAGNOSIS — Z823 Family history of stroke: Secondary | ICD-10-CM

## 2013-10-03 DIAGNOSIS — I129 Hypertensive chronic kidney disease with stage 1 through stage 4 chronic kidney disease, or unspecified chronic kidney disease: Secondary | ICD-10-CM | POA: Diagnosis present

## 2013-10-03 DIAGNOSIS — I2510 Atherosclerotic heart disease of native coronary artery without angina pectoris: Secondary | ICD-10-CM | POA: Diagnosis present

## 2013-10-03 DIAGNOSIS — Z515 Encounter for palliative care: Secondary | ICD-10-CM

## 2013-10-03 DIAGNOSIS — J111 Influenza due to unidentified influenza virus with other respiratory manifestations: Secondary | ICD-10-CM | POA: Diagnosis present

## 2013-10-03 DIAGNOSIS — Z993 Dependence on wheelchair: Secondary | ICD-10-CM

## 2013-10-03 DIAGNOSIS — J9690 Respiratory failure, unspecified, unspecified whether with hypoxia or hypercapnia: Secondary | ICD-10-CM

## 2013-10-03 DIAGNOSIS — R7309 Other abnormal glucose: Secondary | ICD-10-CM | POA: Diagnosis present

## 2013-10-03 DIAGNOSIS — N289 Disorder of kidney and ureter, unspecified: Secondary | ICD-10-CM

## 2013-10-03 DIAGNOSIS — I4891 Unspecified atrial fibrillation: Secondary | ICD-10-CM | POA: Diagnosis present

## 2013-10-03 DIAGNOSIS — D649 Anemia, unspecified: Secondary | ICD-10-CM | POA: Diagnosis present

## 2013-10-03 DIAGNOSIS — E876 Hypokalemia: Secondary | ICD-10-CM | POA: Diagnosis present

## 2013-10-03 DIAGNOSIS — R627 Adult failure to thrive: Secondary | ICD-10-CM | POA: Diagnosis present

## 2013-10-03 DIAGNOSIS — H409 Unspecified glaucoma: Secondary | ICD-10-CM | POA: Diagnosis present

## 2013-10-03 DIAGNOSIS — I509 Heart failure, unspecified: Secondary | ICD-10-CM | POA: Diagnosis present

## 2013-10-03 DIAGNOSIS — J449 Chronic obstructive pulmonary disease, unspecified: Secondary | ICD-10-CM

## 2013-10-03 DIAGNOSIS — Z66 Do not resuscitate: Secondary | ICD-10-CM | POA: Diagnosis present

## 2013-10-03 DIAGNOSIS — J441 Chronic obstructive pulmonary disease with (acute) exacerbation: Principal | ICD-10-CM

## 2013-10-03 DIAGNOSIS — I5032 Chronic diastolic (congestive) heart failure: Secondary | ICD-10-CM

## 2013-10-03 DIAGNOSIS — E7800 Pure hypercholesterolemia, unspecified: Secondary | ICD-10-CM | POA: Diagnosis present

## 2013-10-03 DIAGNOSIS — Z86711 Personal history of pulmonary embolism: Secondary | ICD-10-CM

## 2013-10-03 HISTORY — DX: Chronic pain syndrome: G89.4

## 2013-10-03 HISTORY — DX: Influenza due to other identified influenza virus with other respiratory manifestations: J10.1

## 2013-10-03 HISTORY — DX: Chronic kidney disease, unspecified: N18.9

## 2013-10-03 LAB — PROTIME-INR
INR: 1.44 (ref 0.00–1.49)
Prothrombin Time: 17.2 seconds — ABNORMAL HIGH (ref 11.6–15.2)

## 2013-10-03 LAB — CBC WITH DIFFERENTIAL/PLATELET
BASOS ABS: 0 10*3/uL (ref 0.0–0.1)
BASOS PCT: 0 % (ref 0–1)
Eosinophils Absolute: 0 10*3/uL (ref 0.0–0.7)
Eosinophils Relative: 0 % (ref 0–5)
HCT: 36.8 % (ref 36.0–46.0)
Hemoglobin: 11.4 g/dL — ABNORMAL LOW (ref 12.0–15.0)
Lymphocytes Relative: 33 % (ref 12–46)
Lymphs Abs: 1.4 10*3/uL (ref 0.7–4.0)
MCH: 23.8 pg — AB (ref 26.0–34.0)
MCHC: 31 g/dL (ref 30.0–36.0)
MCV: 76.7 fL — ABNORMAL LOW (ref 78.0–100.0)
Monocytes Absolute: 0.6 10*3/uL (ref 0.1–1.0)
Monocytes Relative: 13 % — ABNORMAL HIGH (ref 3–12)
NEUTROS ABS: 2.2 10*3/uL (ref 1.7–7.7)
Neutrophils Relative %: 54 % (ref 43–77)
Platelets: 158 10*3/uL (ref 150–400)
RBC: 4.8 MIL/uL (ref 3.87–5.11)
RDW: 20 % — AB (ref 11.5–15.5)
WBC: 4.2 10*3/uL (ref 4.0–10.5)

## 2013-10-03 LAB — BASIC METABOLIC PANEL
BUN: 24 mg/dL — ABNORMAL HIGH (ref 6–23)
CO2: 26 mEq/L (ref 19–32)
CREATININE: 1.96 mg/dL — AB (ref 0.50–1.10)
Calcium: 9.5 mg/dL (ref 8.4–10.5)
Chloride: 101 mEq/L (ref 96–112)
GFR, EST AFRICAN AMERICAN: 26 mL/min — AB (ref >90)
GFR, EST NON AFRICAN AMERICAN: 22 mL/min — AB (ref >90)
GLUCOSE: 100 mg/dL — AB (ref 70–99)
Potassium: 4 mEq/L (ref 3.7–5.3)
Sodium: 140 mEq/L (ref 137–147)

## 2013-10-03 LAB — INFLUENZA PANEL BY PCR (TYPE A & B)
H1N1 flu by pcr: NOT DETECTED
INFLAPCR: NEGATIVE
Influenza B By PCR: POSITIVE — AB

## 2013-10-03 LAB — LACTIC ACID, PLASMA: LACTIC ACID, VENOUS: 1 mmol/L (ref 0.5–2.2)

## 2013-10-03 LAB — GLUCOSE, CAPILLARY
GLUCOSE-CAPILLARY: 130 mg/dL — AB (ref 70–99)
Glucose-Capillary: 291 mg/dL — ABNORMAL HIGH (ref 70–99)

## 2013-10-03 LAB — URINALYSIS, ROUTINE W REFLEX MICROSCOPIC
Bilirubin Urine: NEGATIVE
GLUCOSE, UA: NEGATIVE mg/dL
Hgb urine dipstick: NEGATIVE
KETONES UR: NEGATIVE mg/dL
LEUKOCYTES UA: NEGATIVE
NITRITE: NEGATIVE
PROTEIN: NEGATIVE mg/dL
Specific Gravity, Urine: 1.02 (ref 1.005–1.030)
Urobilinogen, UA: 0.2 mg/dL (ref 0.0–1.0)
pH: 5.5 (ref 5.0–8.0)

## 2013-10-03 LAB — BLOOD GAS, ARTERIAL
ACID-BASE EXCESS: 1 mmol/L (ref 0.0–2.0)
Bicarbonate: 25.1 mEq/L — ABNORMAL HIGH (ref 20.0–24.0)
Delivery systems: POSITIVE
Drawn by: 105551
Expiratory PAP: 6
FIO2: 100 %
Inspiratory PAP: 16
LHR: 20 {breaths}/min
O2 Saturation: 99.5 %
PCO2 ART: 39.9 mmHg (ref 35.0–45.0)
PH ART: 7.414 (ref 7.350–7.450)
PO2 ART: 392 mmHg — AB (ref 80.0–100.0)
Patient temperature: 37
TCO2: 22.8 mmol/L (ref 0–100)

## 2013-10-03 LAB — TSH: TSH: 1.265 u[IU]/mL (ref 0.350–4.500)

## 2013-10-03 LAB — POCT I-STAT TROPONIN I: TROPONIN I, POC: 0.02 ng/mL (ref 0.00–0.08)

## 2013-10-03 LAB — MRSA PCR SCREENING: MRSA BY PCR: NEGATIVE

## 2013-10-03 LAB — TROPONIN I

## 2013-10-03 LAB — PRO B NATRIURETIC PEPTIDE: Pro B Natriuretic peptide (BNP): 1700 pg/mL — ABNORMAL HIGH (ref 0–450)

## 2013-10-03 MED ORDER — ACETAMINOPHEN 325 MG PO TABS
650.0000 mg | ORAL_TABLET | Freq: Four times a day (QID) | ORAL | Status: DC | PRN
  Administered 2013-10-04: 650 mg via ORAL
  Filled 2013-10-03: qty 2

## 2013-10-03 MED ORDER — HYDROCODONE-ACETAMINOPHEN 5-325 MG PO TABS
1.0000 | ORAL_TABLET | ORAL | Status: DC | PRN
  Administered 2013-10-03 – 2013-10-10 (×5): 1 via ORAL
  Filled 2013-10-03 (×7): qty 1

## 2013-10-03 MED ORDER — METHYLPREDNISOLONE SODIUM SUCC 125 MG IJ SOLR
60.0000 mg | Freq: Four times a day (QID) | INTRAMUSCULAR | Status: DC
  Administered 2013-10-03 – 2013-10-05 (×8): 60 mg via INTRAVENOUS
  Filled 2013-10-03 (×8): qty 2

## 2013-10-03 MED ORDER — LEVOFLOXACIN IN D5W 500 MG/100ML IV SOLN
500.0000 mg | INTRAVENOUS | Status: DC
  Administered 2013-10-05 – 2013-10-07 (×2): 500 mg via INTRAVENOUS
  Filled 2013-10-03 (×4): qty 100

## 2013-10-03 MED ORDER — ACETAMINOPHEN 650 MG RE SUPP: 650.0000 mg | Freq: Four times a day (QID) | RECTAL | Status: DC | PRN

## 2013-10-03 MED ORDER — LEVALBUTEROL HCL 0.63 MG/3ML IN NEBU
0.6300 mg | INHALATION_SOLUTION | RESPIRATORY_TRACT | Status: DC
  Administered 2013-10-03 – 2013-10-09 (×40): 0.63 mg via RESPIRATORY_TRACT
  Filled 2013-10-03 (×41): qty 3

## 2013-10-03 MED ORDER — LORAZEPAM 0.5 MG PO TABS
0.5000 mg | ORAL_TABLET | Freq: Three times a day (TID) | ORAL | Status: DC
  Administered 2013-10-03 – 2013-10-05 (×6): 0.5 mg via ORAL
  Filled 2013-10-03 (×9): qty 1

## 2013-10-03 MED ORDER — WARFARIN SODIUM 5 MG PO TABS
5.0000 mg | ORAL_TABLET | Freq: Once | ORAL | Status: AC
  Administered 2013-10-03: 5 mg via ORAL
  Filled 2013-10-03: qty 1

## 2013-10-03 MED ORDER — IPRATROPIUM BROMIDE 0.02 % IN SOLN
0.5000 mg | Freq: Once | RESPIRATORY_TRACT | Status: AC
  Administered 2013-10-03: 0.5 mg via RESPIRATORY_TRACT
  Filled 2013-10-03: qty 2.5

## 2013-10-03 MED ORDER — INSULIN GLARGINE 100 UNIT/ML ~~LOC~~ SOLN
10.0000 [IU] | Freq: Every day | SUBCUTANEOUS | Status: DC
Start: 2013-10-03 — End: 2013-10-10
  Administered 2013-10-03 – 2013-10-09 (×7): 10 [IU] via SUBCUTANEOUS
  Filled 2013-10-03 (×9): qty 0.1

## 2013-10-03 MED ORDER — DULOXETINE HCL 30 MG PO CPEP
30.0000 mg | ORAL_CAPSULE | Freq: Every day | ORAL | Status: DC
  Administered 2013-10-03 – 2013-10-08 (×6): 30 mg via ORAL
  Filled 2013-10-03 (×8): qty 1

## 2013-10-03 MED ORDER — SIMVASTATIN 20 MG PO TABS
10.0000 mg | ORAL_TABLET | Freq: Every day | ORAL | Status: DC
  Administered 2013-10-03 – 2013-10-08 (×5): 10 mg via ORAL
  Filled 2013-10-03 (×6): qty 1

## 2013-10-03 MED ORDER — OSELTAMIVIR PHOSPHATE 75 MG PO CAPS
75.0000 mg | ORAL_CAPSULE | Freq: Every day | ORAL | Status: DC
  Administered 2013-10-03 – 2013-10-04 (×2): 75 mg via ORAL

## 2013-10-03 MED ORDER — CHLORHEXIDINE GLUCONATE 0.12 % MT SOLN
15.0000 mL | Freq: Two times a day (BID) | OROMUCOSAL | Status: DC
  Administered 2013-10-03 – 2013-10-12 (×16): 15 mL via OROMUCOSAL
  Filled 2013-10-03 (×17): qty 15

## 2013-10-03 MED ORDER — DOCUSATE SODIUM 100 MG PO CAPS
100.0000 mg | ORAL_CAPSULE | Freq: Two times a day (BID) | ORAL | Status: DC
  Administered 2013-10-03 – 2013-10-08 (×10): 100 mg via ORAL
  Filled 2013-10-03 (×14): qty 1

## 2013-10-03 MED ORDER — IPRATROPIUM BROMIDE 0.02 % IN SOLN
0.5000 mg | RESPIRATORY_TRACT | Status: DC
  Administered 2013-10-03 – 2013-10-12 (×55): 0.5 mg via RESPIRATORY_TRACT
  Filled 2013-10-03 (×56): qty 2.5

## 2013-10-03 MED ORDER — VANCOMYCIN HCL IN DEXTROSE 750-5 MG/150ML-% IV SOLN
750.0000 mg | INTRAVENOUS | Status: DC
  Administered 2013-10-04: 750 mg via INTRAVENOUS
  Filled 2013-10-03 (×2): qty 150

## 2013-10-03 MED ORDER — ONDANSETRON HCL 4 MG/2ML IJ SOLN
4.0000 mg | Freq: Four times a day (QID) | INTRAMUSCULAR | Status: DC | PRN
  Administered 2013-10-03 – 2013-10-08 (×8): 4 mg via INTRAVENOUS
  Filled 2013-10-03 (×8): qty 2

## 2013-10-03 MED ORDER — FUROSEMIDE 80 MG PO TABS
80.0000 mg | ORAL_TABLET | Freq: Two times a day (BID) | ORAL | Status: DC
  Administered 2013-10-03 – 2013-10-05 (×5): 80 mg via ORAL
  Filled 2013-10-03 (×7): qty 1

## 2013-10-03 MED ORDER — METHYLPREDNISOLONE SODIUM SUCC 125 MG IJ SOLR
125.0000 mg | Freq: Once | INTRAMUSCULAR | Status: AC
Start: 2013-10-03 — End: 2013-10-03
  Administered 2013-10-03: 125 mg via INTRAVENOUS
  Filled 2013-10-03: qty 2

## 2013-10-03 MED ORDER — FENTANYL 12 MCG/HR TD PT72
12.5000 ug | MEDICATED_PATCH | TRANSDERMAL | Status: DC
  Administered 2013-10-03 – 2013-10-06 (×2): 12.5 ug via TRANSDERMAL
  Filled 2013-10-03 (×2): qty 1

## 2013-10-03 MED ORDER — ALBUTEROL SULFATE (2.5 MG/3ML) 0.083% IN NEBU: 2.5000 mg | INHALATION_SOLUTION | RESPIRATORY_TRACT | Status: DC | PRN

## 2013-10-03 MED ORDER — POLYETHYLENE GLYCOL 3350 17 G PO PACK
17.0000 g | PACK | Freq: Every day | ORAL | Status: DC
  Administered 2013-10-03 – 2013-10-10 (×7): 17 g via ORAL
  Filled 2013-10-03 (×8): qty 1

## 2013-10-03 MED ORDER — GABAPENTIN 300 MG PO CAPS
300.0000 mg | ORAL_CAPSULE | Freq: Three times a day (TID) | ORAL | Status: DC
  Administered 2013-10-03 – 2013-10-08 (×16): 300 mg via ORAL
  Filled 2013-10-03 (×4): qty 1
  Filled 2013-10-03: qty 3
  Filled 2013-10-03 (×15): qty 1

## 2013-10-03 MED ORDER — DEXTROSE 5 % IV SOLN
1.0000 g | INTRAVENOUS | Status: DC
  Filled 2013-10-03 (×2): qty 1

## 2013-10-03 MED ORDER — AMIODARONE HCL 200 MG PO TABS
200.0000 mg | ORAL_TABLET | Freq: Every day | ORAL | Status: DC
  Administered 2013-10-03 – 2013-10-08 (×6): 200 mg via ORAL
  Filled 2013-10-03 (×9): qty 1

## 2013-10-03 MED ORDER — ALBUTEROL SULFATE (2.5 MG/3ML) 0.083% IN NEBU
5.0000 mg | INHALATION_SOLUTION | Freq: Once | RESPIRATORY_TRACT | Status: AC
  Administered 2013-10-03: 5 mg via RESPIRATORY_TRACT
  Filled 2013-10-03: qty 6

## 2013-10-03 MED ORDER — DEXTROSE 5 % IV SOLN
1.0000 g | Freq: Two times a day (BID) | INTRAVENOUS | Status: DC
  Administered 2013-10-03: 1 g via INTRAVENOUS
  Filled 2013-10-03 (×5): qty 1

## 2013-10-03 MED ORDER — LEVOFLOXACIN IN D5W 750 MG/150ML IV SOLN
750.0000 mg | Freq: Once | INTRAVENOUS | Status: AC
  Administered 2013-10-03: 750 mg via INTRAVENOUS
  Filled 2013-10-03: qty 150

## 2013-10-03 MED ORDER — METOPROLOL TARTRATE 50 MG PO TABS: 50.0000 mg | ORAL_TABLET | ORAL | Status: DC

## 2013-10-03 MED ORDER — DORZOLAMIDE HCL 2 % OP SOLN
1.0000 [drp] | Freq: Two times a day (BID) | OPHTHALMIC | Status: DC
  Administered 2013-10-03 – 2013-10-12 (×19): 1 [drp] via OPHTHALMIC
  Filled 2013-10-03: qty 10

## 2013-10-03 MED ORDER — BRIMONIDINE TARTRATE 0.15 % OP SOLN
1.0000 [drp] | Freq: Two times a day (BID) | OPHTHALMIC | Status: DC
  Administered 2013-10-03 – 2013-10-12 (×18): 1 [drp] via OPHTHALMIC
  Filled 2013-10-03: qty 5

## 2013-10-03 MED ORDER — GUAIFENESIN ER 600 MG PO TB12
600.0000 mg | ORAL_TABLET | Freq: Two times a day (BID) | ORAL | Status: DC
  Administered 2013-10-03 – 2013-10-08 (×11): 600 mg via ORAL
  Filled 2013-10-03 (×13): qty 1

## 2013-10-03 MED ORDER — VANCOMYCIN HCL 10 G IV SOLR
1250.0000 mg | Freq: Once | INTRAVENOUS | Status: AC
  Administered 2013-10-03: 1250 mg via INTRAVENOUS
  Filled 2013-10-03: qty 1250

## 2013-10-03 MED ORDER — WARFARIN - PHARMACIST DOSING INPATIENT
Status: DC
  Administered 2013-10-03: 16:00:00

## 2013-10-03 MED ORDER — INSULIN ASPART 100 UNIT/ML ~~LOC~~ SOLN: 0.0000 [IU] | Freq: Every day | SUBCUTANEOUS | Status: DC

## 2013-10-03 MED ORDER — POTASSIUM CHLORIDE CRYS ER 20 MEQ PO TBCR
40.0000 meq | EXTENDED_RELEASE_TABLET | Freq: Every day | ORAL | Status: DC
  Administered 2013-10-03 – 2013-10-08 (×6): 40 meq via ORAL
  Filled 2013-10-03 (×8): qty 2

## 2013-10-03 MED ORDER — INSULIN ASPART 100 UNIT/ML ~~LOC~~ SOLN
0.0000 [IU] | Freq: Three times a day (TID) | SUBCUTANEOUS | Status: DC
  Administered 2013-10-03: 8 [IU] via SUBCUTANEOUS
  Administered 2013-10-04: 2 [IU] via SUBCUTANEOUS
  Administered 2013-10-04 (×2): 5 [IU] via SUBCUTANEOUS
  Administered 2013-10-05 – 2013-10-08 (×8): 3 [IU] via SUBCUTANEOUS
  Administered 2013-10-08: 2 [IU] via SUBCUTANEOUS
  Administered 2013-10-08 – 2013-10-09 (×2): 3 [IU] via SUBCUTANEOUS
  Administered 2013-10-09 (×2): 2 [IU] via SUBCUTANEOUS

## 2013-10-03 MED ORDER — HYDROXYZINE HCL 25 MG PO TABS
25.0000 mg | ORAL_TABLET | Freq: Every day | ORAL | Status: DC | PRN
  Administered 2013-10-04 – 2013-10-10 (×3): 25 mg via ORAL
  Filled 2013-10-03 (×3): qty 1

## 2013-10-03 MED ORDER — LATANOPROST 0.005 % OP SOLN
1.0000 [drp] | Freq: Every day | OPHTHALMIC | Status: DC
  Administered 2013-10-03 – 2013-10-11 (×9): 1 [drp] via OPHTHALMIC
  Filled 2013-10-03: qty 2.5

## 2013-10-03 MED ORDER — BENZONATATE 100 MG PO CAPS
100.0000 mg | ORAL_CAPSULE | Freq: Three times a day (TID) | ORAL | Status: DC
  Administered 2013-10-03 – 2013-10-08 (×14): 100 mg via ORAL
  Filled 2013-10-03 (×19): qty 1

## 2013-10-03 MED ORDER — POTASSIUM CHLORIDE IN NACL 20-0.9 MEQ/L-% IV SOLN
INTRAVENOUS | Status: AC
  Administered 2013-10-03 (×2): via INTRAVENOUS

## 2013-10-03 MED ORDER — FAMOTIDINE 20 MG PO TABS
20.0000 mg | ORAL_TABLET | Freq: Every day | ORAL | Status: DC
  Administered 2013-10-03 – 2013-10-08 (×5): 20 mg via ORAL
  Filled 2013-10-03 (×8): qty 1

## 2013-10-03 MED ORDER — TRIMETHOPRIM 100 MG PO TABS
100.0000 mg | ORAL_TABLET | Freq: Every day | ORAL | Status: DC
  Administered 2013-10-03 – 2013-10-08 (×5): 100 mg via ORAL
  Filled 2013-10-03 (×9): qty 1

## 2013-10-03 MED ORDER — ONDANSETRON HCL 4 MG PO TABS
4.0000 mg | ORAL_TABLET | Freq: Four times a day (QID) | ORAL | Status: DC | PRN
  Administered 2013-10-06: 4 mg via ORAL
  Filled 2013-10-03: qty 1

## 2013-10-03 MED ORDER — LEVOTHYROXINE SODIUM 100 MCG PO TABS
100.0000 ug | ORAL_TABLET | Freq: Every day | ORAL | Status: DC
  Administered 2013-10-03 – 2013-10-09 (×6): 100 ug via ORAL
  Filled 2013-10-03 (×7): qty 1

## 2013-10-03 MED ORDER — BIOTENE DRY MOUTH MT LIQD
15.0000 mL | Freq: Two times a day (BID) | OROMUCOSAL | Status: DC
  Administered 2013-10-03 – 2013-10-12 (×14): 15 mL via OROMUCOSAL

## 2013-10-03 NOTE — Progress Notes (Signed)
ANTICOAGULATION CONSULT NOTE - Initial Consult  Pharmacy Consult for Coumadin (chronic Rx PTA) Indication: atrial fibrillation  Allergies  Allergen Reactions  . Naproxen     Patient on coumadin  . Sulfa Antibiotics   . Robaxin [Methocarbamol]     Causes patient to be confused   Patient Measurements: Height: 5\' 3"  (160 cm) Weight: 152 lb 12.5 oz (69.3 kg) IBW/kg (Calculated) : 52.4  Vital Signs: Temp: 100.2 F (37.9 C) (01/31 0758) Temp src: Axillary (01/31 0758) BP: 107/52 mmHg (01/31 0800) Pulse Rate: 70 (01/31 0800)  Labs:  Recent Labs  10/03/13 0530 10/03/13 0607  HGB 11.4*  --   HCT 36.8  --   PLT 158  --   LABPROT 17.2*  --   INR 1.44  --   CREATININE 1.96*  --   TROPONINI  --  <0.30   Estimated Creatinine Clearance: 20 ml/min (by C-G formula based on Cr of 1.96).  Medical History: Past Medical History  Diagnosis Date  . Hypertension   . High cholesterol   . Arthritis   . Hypothyroidism   . Arrhythmia     pacemaker  . Glaucoma   . Pulmonary embolism 06/04/11  . Coronary artery disease   . Atrial fibrillation   . 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 Anxiety   . COPD (chronic obstructive pulmonary disease)   . Compression fracture   . Chronic kidney disease     Stage III. Baseline creatinine 1.6-2.0.  . Chronic pain syndrome 09/06/2008    Qualifier: Diagnosis of  By: Romeo Apple MD, Duffy Rhody     Medications:  Prescriptions prior to admission  Medication Sig Dispense Refill  . acetaminophen (TYLENOL) 325 MG tablet Take 650 mg by mouth every 6 (six) hours as needed for mild pain or fever.       . Alum & Mag Hydroxide-Simeth (MAGIC MOUTHWASH) SOLN Take 10 mLs by mouth 4 (four) times daily as needed.      Marland Kitchen amiodarone (PACERONE) 200 MG tablet Take 200 mg by mouth daily.       . bimatoprost (LUMIGAN) 0.01 % SOLN Place 1 drop into both eyes at bedtime.       . brimonidine (ALPHAGAN P) 0.1 % SOLN Place 1 drop into both eyes 2 (two) times daily.       . Calcium Carbonate-Vitamin D (CALCIUM 600 + D PO) Take 1 tablet by mouth 2 (two) times daily.      Marland Kitchen docusate sodium (COLACE) 100 MG capsule Take 100 mg by mouth 2 (two) times daily.      . dorzolamide (TRUSOPT) 2 % ophthalmic solution Place 1 drop into both eyes 2 (two) times daily.      . DULoxetine (CYMBALTA) 30 MG capsule Take 30 mg by mouth daily.      . fentaNYL (DURAGESIC - DOSED MCG/HR) 12 MCG/HR Place 12.5 mcg onto the skin every 3 (three) days.      . furosemide (LASIX) 80 MG tablet Take 1 tablet (80 mg total) by mouth 2 (two) times daily.  60 tablet  0  . gabapentin (NEURONTIN) 300 MG capsule Take 1 capsule (300 mg total) by mouth 3 (three) times daily.  30 capsule  0  . guaiFENesin (MUCINEX) 600 MG 12 hr tablet Take 1 tablet (600 mg total) by mouth 2 (two) times daily.  14 tablet  0  . HYDROcodone-acetaminophen (NORCO/VICODIN) 5-325 MG per tablet Take 1 tablet by mouth every 4 (four) hours as needed. pain      .  hydrOXYzine (VISTARIL) 25 MG capsule Take 1 capsule by mouth daily as needed for anxiety.       Marland Kitchen. levothyroxine (SYNTHROID, LEVOTHROID) 100 MCG tablet Take 100 mcg by mouth daily.      Marland Kitchen. LORazepam (ATIVAN) 0.5 MG tablet Take 0.5 mg by mouth 3 (three) times daily.      . metoprolol (LOPRESSOR) 50 MG tablet Take 50 mg by mouth every morning.      . ondansetron (ZOFRAN) 4 MG tablet Take 4 mg by mouth every 6 (six) hours as needed. For nausea      . polyethylene glycol powder (GLYCOLAX/MIRALAX) powder Take 17 g by mouth daily.       . potassium chloride SA (K-DUR,KLOR-CON) 20 MEQ tablet Take 2 tablets (40 mEq total) by mouth daily.      . pravastatin (PRAVACHOL) 20 MG tablet Take 1 tablet (20 mg total) by mouth daily.  30 tablet  11  . predniSONE (DELTASONE) 10 MG tablet Take 10-20 mg by mouth See admin instructions. 1. Take two tablets (20mg  total) by mouth three times daily for 2 days (1/11-1/13) 2. Take two tablets (20mg  total) by mouth twice daily for 2 days (1/14-1/15) 3.  Take one tablet (10mg  total) by mouth three times daily for 2 days (1/16-1/17) 4. Take one tablet (10mg  total) by mouth twice daily for 2 days (1/18-1/19) 5. Take one tablet (10mg  total) by mouth once daily for 2 days (1/20-1/21)      . tiotropium (SPIRIVA) 18 MCG inhalation capsule Place 18 mcg into inhaler and inhale daily.      Marland Kitchen. trimethoprim (TRIMPEX) 100 MG tablet Take 100 mg by mouth at bedtime.      Marland Kitchen. warfarin (COUMADIN) 4 MG tablet Take 2-4 mg by mouth daily. Take 1/2 tablet (2mg ) daily except 1 tablet (4mg ) on Mon & Fri.        [name]  Home dose listed above.  INR is sub-therapeutic on admission.  Will boost INR with increased dose today (5mg  x 1).  Pt is also on amiodarone which can interact with coumadin to increase INR.  Goal of Therapy:  INR 2-3 Monitor platelets by anticoagulation protocol: Yes   Plan:  Coumadin 5mg  po today x 1 to boost INR INR daily  [name], [name] A 10/03/2013,8:42 AM

## 2013-10-03 NOTE — Progress Notes (Signed)
ANTIBIOTIC CONSULT NOTE - INITIAL  Pharmacy Consult for Vancomycin and Levaquin Indication: suspected pneumonia, COPD exacerbation  Allergies  Allergen Reactions  . Naproxen     Patient on coumadin  . Sulfa Antibiotics   . Robaxin [Methocarbamol]     Causes patient to be confused   Patient Measurements: Height: 5\' 3"  (160 cm) Weight: 152 lb 12.5 oz (69.3 kg) IBW/kg (Calculated) : 52.4  Vital Signs: Temp: 100.2 F (37.9 C) (01/31 0758) Temp src: Axillary (01/31 0758) BP: 117/56 mmHg (01/31 0900) Pulse Rate: 74 (01/31 0900) Intake/Output from previous day:   Intake/Output from this shift:    Labs:  Recent Labs  10/03/13 0530  WBC 4.2  HGB 11.4*  PLT 158  CREATININE 1.96*   Estimated Creatinine Clearance: 20 ml/min (by C-G formula based on Cr of 1.96). No results found for this basename: VANCOTROUGH, Leodis Binet, VANCORANDOM, GENTTROUGH, GENTPEAK, GENTRANDOM, TOBRATROUGH, TOBRAPEAK, TOBRARND, AMIKACINPEAK, AMIKACINTROU, AMIKACIN,  in the last 72 hours   Microbiology: Recent Results (from the past 720 hour(s))  MRSA PCR SCREENING     Status: None   Collection Time    10/03/13  7:58 AM      Result Value Range Status   MRSA by PCR NEGATIVE  NEGATIVE Final   Comment:            The GeneXpert MRSA Assay (FDA     approved for NASAL specimens     only), is one component of a     comprehensive MRSA colonization     surveillance program. It is not     intended to diagnose MRSA     infection nor to guide or     monitor treatment for     MRSA infections.   Medical History: Past Medical History  Diagnosis Date  . Hypertension   . High cholesterol   . Arthritis   . Hypothyroidism   . Arrhythmia     pacemaker  . Glaucoma   . Pulmonary embolism 06/04/11  . Coronary artery disease   . Atrial fibrillation   . 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 Anxiety   . COPD (chronic obstructive pulmonary disease)   . Compression fracture   . Chronic kidney disease     Stage III. Baseline creatinine 1.6-2.0.  . Chronic pain syndrome 09/06/2008    Qualifier: Diagnosis of  By: Romeo Apple MD, Duffy Rhody     Medications:  Scheduled:  . amiodarone  200 mg Oral Daily  . antiseptic oral rinse  15 mL Mouth Rinse q12n4p  . benzonatate  100 mg Oral TID  . brimonidine  1 drop Both Eyes BID AC  . ceFEPime (MAXIPIME) IV  1 g Intravenous Q12H  . chlorhexidine  15 mL Mouth Rinse BID  . docusate sodium  100 mg Oral BID  . dorzolamide  1 drop Both Eyes BID  . DULoxetine  30 mg Oral Daily  . famotidine  20 mg Oral Daily  . fentaNYL  12.5 mcg Transdermal Q72H  . furosemide  80 mg Oral BID  . gabapentin  300 mg Oral TID  . guaiFENesin  600 mg Oral BID  . insulin aspart  0-15 Units Subcutaneous TID WC  . insulin aspart  0-5 Units Subcutaneous QHS  . insulin glargine  10 Units Subcutaneous QHS  . ipratropium  0.5 mg Nebulization Q4H  . latanoprost  1 drop Both Eyes QHS  . levalbuterol  0.63 mg Nebulization Q4H  . [START ON 10/05/2013] levofloxacin (LEVAQUIN) IV  500 mg Intravenous Q48H  . levofloxacin (LEVAQUIN) IV  750 mg Intravenous Once  . levothyroxine  100 mcg Oral QAC breakfast  . LORazepam  0.5 mg Oral TID  . methylPREDNISolone (SOLU-MEDROL) injection  60 mg Intravenous Q6H  . polyethylene glycol  17 g Oral Daily  . potassium chloride SA  40 mEq Oral Daily  . simvastatin  10 mg Oral q1800  . trimethoprim  100 mg Oral QHS  . vancomycin  1,250 mg Intravenous Once  . [START ON 10/04/2013] vancomycin  750 mg Intravenous Q24H  . warfarin  5 mg Oral Once  . Warfarin - Pharmacist Dosing Inpatient   Does not apply Q24H   Assessment: 80yo female admitted with COPD exacerbation and suspected pneumonia.  Pt has elevated SCr.  Estimated Creatinine Clearance: 20 ml/min (by C-G formula based on Cr of 1.96). Pt has been started on Cefepime, Levaquin, Vancomycin, and Trimethoprim from PTA med list has been continued.  ANTIBIOTICS: Cefepime 1/31 >> Levaquin 1/31 >> Vancomycin  1/31 >> Trimethoprim from PTA med list  Goal of Therapy:  Vancomycin trough level 15-20 mcg/ml  Plan:  Vancomycin 1250mg  IV today x 1 (loading dose) then Vancomycin 750mg  IV q24hrs Check trough at steady state Cefepime 1gm IV q24hrs Levaquin 750mg  IV today x 1 then 500mg  IV q48hrs Switch to PO when clinically improved Monitor labs, renal fxn, and cultures  Valrie HartHall, Nieves Chapa A 10/03/2013,10:53 AM

## 2013-10-03 NOTE — Progress Notes (Signed)
The patient is a DNR status per my conversation with her son and POA Mr. Sabino Niemannaschal.

## 2013-10-03 NOTE — Progress Notes (Signed)
eLink Physician-Brief Progress Note Patient Name: [name] DOB: 12/17/1926 MRN: [id number]  Date of Service  10/03/2013   HPI/Events of Note   Alerted to low BP. Review of old records reveals multiple readings in last 3 months with DBP < 60. Appears to be near baseline.  eICU Interventions  Monitor.   Intervention Category Intermediate Interventions: OtherCurt [name]:  Hansen Carino R. 10/03/2013, 4:28 PM

## 2013-10-03 NOTE — H&P (Signed)
[facility] History and Physical  BAYLI QUESINBERRY ZOX:[id number] DOB: May 26, 1927 DOA: 10/03/2013  Referring physician: ED physician, Dr. Bebe Shaggy PCP: Cassell Smiles., MD   Chief Complaint: Shortness of breath  HPI: JONIA OAKEY is a 80 y.o. female with a history of COPD, chronic atrial fibrillation on chronic Coumadin therapy, chronic kidney disease, and chronic pain syndrome, who presents from the [facility] with a chief complaint of shortness of breath. The patient has had shortness of breath for 3 or 4 days. She has had a productive cough with yellow sputum. She has had subjective fever and chills. She denies chest pain, pleurisy, abdominal pain, nausea, vomiting, or diarrhea. In the emergency department, she is hemodynamically stable and has a low-grade fever of 100.2.  She was placed on BiPAP for respiratory distress. Her ABG on BiPAP and 100% oxygen revealed a pH of 7.4, PCO2 of 40, and PO2 of 392. Her chest x-ray reveals low lung volumes with bibasilar atelectasis. Her EKG reveals ventricular paced rhythm with a heart rate of 85 beats per minute. Her troponin I is negative. Her white blood cell count is within normal limits. Her creatinine is 1.96 and her BUN is 24. She is being admitted for further evaluation and management.   Review of Systems:  Her review of systems as is above in the history of present illness. In addition, she has chronic back pain and chronic leg pain. She is wheelchair-bound because of pain from spinal stenosis. Otherwise review of systems is negative.    Past Medical History  Diagnosis Date  . Hypertension   . High cholesterol   . Arthritis   . Hypothyroidism   . Arrhythmia     pacemaker  . Glaucoma   . Pulmonary embolism 06/04/11  . Coronary artery disease   . Atrial fibrillation   . 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 Anxiety   . COPD (chronic obstructive pulmonary disease)   . Compression fracture   . Chronic kidney disease     Stage III. Baseline creatinine 1.6-2.0.  . Chronic pain syndrome 09/06/2008    Qualifier: Diagnosis of  By: Romeo Apple MD, Duffy Rhody     Past Surgical History  Procedure Laterality Date  . Total hip arthroplasty      right side  . Pacemaker insertion    . Appendectomy    . Abdominal hysterectomy     Social History: She is married. She has 3 children. She is retired. She is a resident of assisted-living facility, [address]. She ambulates via wheelchair only. She denies tobacco, alcohol, and illicit drug use.   Allergies  Allergen Reactions  . Naproxen     Patient on coumadin  . Sulfa Antibiotics   . Robaxin [Methocarbamol]     Causes patient to be confused    Family History  Problem Relation Age of Onset  . Heart attack Mother   . Cancer Father     stomach  . Stroke Sister   . Stroke Brother      Prior to Admission medications   Medication Sig Start Date End Date Taking? Authorizing Provider  acetaminophen (TYLENOL) 325 MG tablet Take 650 mg by mouth every 6 (six) hours as needed for mild pain or fever.     Historical Provider, MD  Alum & Mag Hydroxide-Simeth (MAGIC MOUTHWASH) SOLN Take 10 mLs by mouth 4 (four) times daily as needed.    Historical Provider, MD  amiodarone (PACERONE) 200 MG tablet Take 200 mg by mouth daily.  04/30/11   Historical Provider, MD  bimatoprost (LUMIGAN) 0.01 % SOLN Place 1 drop into both eyes at bedtime.     Historical Provider, MD  brimonidine (ALPHAGAN P) 0.1 % SOLN Place 1 drop into both eyes 2 (two) times daily.    Historical Provider, MD  Calcium Carbonate-Vitamin D (CALCIUM 600 + D PO) Take 1 tablet by mouth 2 (two) times daily.    Historical Provider, MD  docusate sodium (COLACE) 100 MG capsule Take 100 mg by mouth 2 (two) times daily.    Historical Provider, MD  dorzolamide (TRUSOPT) 2 % ophthalmic solution Place 1 drop into both eyes 2 (two) times daily.    Historical Provider, MD  DULoxetine (CYMBALTA) 30 MG capsule Take 30 mg by mouth  daily.    Historical Provider, MD  fentaNYL (DURAGESIC - DOSED MCG/HR) 12 MCG/HR Place 12.5 mcg onto the skin every 3 (three) days.    Historical Provider, MD  furosemide (LASIX) 80 MG tablet Take 1 tablet (80 mg total) by mouth 2 (two) times daily. 02/25/13 07/26/14  Gwenyth Bender, NP  gabapentin (NEURONTIN) 300 MG capsule Take 1 capsule (300 mg total) by mouth 3 (three) times daily. 02/25/13 05/25/14  Gwenyth Bender, NP  guaiFENesin (MUCINEX) 600 MG 12 hr tablet Take 1 tablet (600 mg total) by mouth 2 (two) times daily. 01/21/13   Erick Blinks, MD  HYDROcodone-acetaminophen (NORCO/VICODIN) 5-325 MG per tablet Take 1 tablet by mouth every 4 (four) hours as needed. pain 09/14/13   Historical Provider, MD  hydrOXYzine (VISTARIL) 25 MG capsule Take 1 capsule by mouth daily as needed for anxiety.  05/21/13   Historical Provider, MD  levothyroxine (SYNTHROID, LEVOTHROID) 100 MCG tablet Take 100 mcg by mouth daily.    Historical Provider, MD  LORazepam (ATIVAN) 0.5 MG tablet Take 0.5 mg by mouth 3 (three) times daily.    Historical Provider, MD  metoprolol (LOPRESSOR) 50 MG tablet Take 50 mg by mouth every morning.    Historical Provider, MD  ondansetron (ZOFRAN) 4 MG tablet Take 4 mg by mouth every 6 (six) hours as needed. For nausea    Historical Provider, MD  polyethylene glycol powder (GLYCOLAX/MIRALAX) powder Take 17 g by mouth daily.     Historical Provider, MD  potassium chloride SA (K-DUR,KLOR-CON) 20 MEQ tablet Take 2 tablets (40 mEq total) by mouth daily. 09/12/12   Nimish Normajean Glasgow, MD  pravastatin (PRAVACHOL) 20 MG tablet Take 1 tablet (20 mg total) by mouth daily. 04/17/13   Mihai Croitoru, MD  predniSONE (DELTASONE) 10 MG tablet Take 10-20 mg by mouth See admin instructions. 1. Take two tablets (20mg  total) by mouth three times daily for 2 days (1/11-1/13) 2. Take two tablets (20mg  total) by mouth twice daily for 2 days (1/14-1/15) 3. Take one tablet (10mg  total) by mouth three times daily for 2  days (1/16-1/17) 4. Take one tablet (10mg  total) by mouth twice daily for 2 days (1/18-1/19) 5. Take one tablet (10mg  total) by mouth once daily for 2 days (1/20-1/21)    Historical Provider, MD  tiotropium (SPIRIVA) 18 MCG inhalation capsule Place 18 mcg into inhaler and inhale daily.    Historical Provider, MD  trimethoprim (TRIMPEX) 100 MG tablet Take 100 mg by mouth at bedtime.    Historical Provider, MD  warfarin (COUMADIN) 4 MG tablet Take 2-4 mg by mouth daily. Take 1/2 tablet (2mg ) daily except 1 tablet (4mg ) on Mon & Fri.    Historical Provider, MD  Physical Exam: Filed Vitals:   10/03/13 0758  BP: 129/69  Pulse: 74  Temp: 100.2 F (37.9 C)  Resp:     BP 129/69  Pulse 74  Temp(Src) 100.2 F (37.9 C) (Axillary)  Resp 16  Ht 5\' 3"  (1.6 m)  Wt 69.3 kg (152 lb 12.5 oz)  BMI 27.07 kg/m2  SpO2 99%  General:  Elderly 80 year old woman on BiPAP, but alert. She currently denies shortness of breath. Eyes: PERRL, normal lids, irises & conjunctiva ENT: grossly normal hearing, lips & tongue Neck: no LAD, masses or thyromegaly Cardiovascular: Irregular, irregular. No LE edema. Telemetry: Ventricular paced rhythm. Respiratory: Scattered wheezing bilaterally, breathing mildly labored. Abdomen: soft, ntnd Skin: Few minor excoriations on her legs. Skin generally dry. Musculoskeletal: grossly normal tone BUE/BLE Psychiatric: grossly normal mood and affect, speech fluent and appropriate Neurologic: She is alert and oriented to herself in the hospital. Her speech is clear. Cranial nerves II through XII are grossly intact. Strength not specifically tested, but she is able to move both of her legs and her arms upon request. Sensation is grossly intact.           Labs on Admission:  Basic Metabolic Panel:  Recent Labs Lab 10/03/13 0530  NA 140  K 4.0  CL 101  CO2 26  GLUCOSE 100*  BUN 24*  CREATININE 1.96*  CALCIUM 9.5   Liver Function Tests: No results found for this  basename: AST, ALT, ALKPHOS, BILITOT, PROT, ALBUMIN,  in the last 168 hours No results found for this basename: LIPASE, AMYLASE,  in the last 168 hours No results found for this basename: AMMONIA,  in the last 168 hours CBC:  Recent Labs Lab 10/03/13 0530  WBC 4.2  NEUTROABS 2.2  HGB 11.4*  HCT 36.8  MCV 76.7*  PLT 158   Cardiac Enzymes:  Recent Labs Lab 10/03/13 0607  TROPONINI <0.30    BNP (last 3 results)  Recent Labs  08/03/13 1042 09/01/13 1455 10/03/13 0530  PROBNP 1912.0* 1793.0* 1700.0*   CBG: No results found for this basename: GLUCAP,  in the last 168 hours  Radiological Exams on Admission: Dg Chest Portable 1 View  10/03/2013   CLINICAL DATA:  Shortness of breath  EXAM: PORTABLE CHEST - 1 VIEW  COMPARISON:  09/09/2013  FINDINGS: Stable appearance of left approach dual-chamber pacer leads.  Cardiomegaly which is chronic. Mediastinal contours are distorted by leftward rotation.  Low lung volumes with streaky basilar opacities. Elevation of the left diaphragm is presumably from volume loss. No evidence of effusion or pneumothorax. No overt edema.  IMPRESSION: Low lung volumes with bibasilar atelectasis.   Electronically Signed   By: Tiburcio PeaJonathan  Watts M.D.   On: 10/03/2013 06:02    EKG: Independently reviewed. As above in history present illness.  Assessment/Plan Principal Problem:   COPD exacerbation Active Problems:   PAF (paroxysmal atrial fibrillation)   Chronic diastolic heart failure   Acute respiratory distress   Long term (current) use of anticoagulants   Stage III chronic kidney disease   Chronic pain syndrome   HTN (hypertension)   Hypothyroidism   Spinal stenosis of lumbar region   Pacemaker - dual chamber Medtronic 2006, gen change 2012   Anemia   1. COPD with exacerbation with acute respiratory distress. This is her second admission for the same, over the past 6 weeks. She denies any history of tobacco use. Her ABG is reassuring, but it was  obtained on BiPAP. She appears to be more  comfortable compared to when she first arrived. She is running a low-grade fever, and therefore, we will assess for influenza. Also, it appears that she had recently completed a prednisone taper per medication list from the assisted living facility. 2. Paroxysmal atrial fibrillation, on chronic Coumadin. History of pacemaker secondary to sick sinus syndrome. Her INR is subtherapeutic. We will ask the pharmacy staff to monitor her INR and adjust her Coumadin dosing. Her rate is currently controlled on amiodarone and metoprolol. Because of the wheezing, we'll temporarily hold the metoprolol. 3. Chronic diastolic heart failure. Currently compensated. She is chronically treated with 80 mg of Lasix twice a day. 4. Hypertension. Currently stable. 5. Chronic kidney disease, stage III. In review of her medical record, her baseline creatinine ranges from 1.6-2.0. Today, it is currently stable. 6. Hypothyroidism. We'll continue Synthroid. We'll order a TSH. 7. Chronic pain syndrome. This is presumed secondary to spinal stenosis. She is on chronic fentanyl transdermal and when necessary hydrocodone. These will be continued. Also, the patient says that she is virtually wheelchair bound because of pain and weakness in her legs.    Plan: 1. As above in history present illness. 2. The patient received 125 mg of Solu-Medrol in the emergency department. We'll continue IV steroids at lower dosing. 3. Will cover her with broad-spectrum antibiotics: Levaquin, cefepime, and vancomycin given her recent hospitalization. We'll hold off on Tamiflu for now. We will check an influenza panel. 4. Xopenex and Atrovent nebulizers. Tessalon Perles for cough. 5. Change BiPAP to when necessary. Decrease oxygen to keep oxygen saturation in the 90-92% range. 6. Add sliding scale NovoLog for anticipated steroid-induced hyperglycemia. 7. Monitor in the step down unit. 8. Gentle IV fluids over  the next 24 hours.     Code Status: Full code Family Communication: Family not available Disposition Plan: Anticipate discharge back to [facility] when clinically appropriate.  Time spent: One hour  [facility] Pager [phone]

## 2013-10-03 NOTE — ED Notes (Signed)
Improving with bipap. Responds to directions well. Tries to answer questions around bipap mask

## 2013-10-03 NOTE — ED Provider Notes (Signed)
CSN: [id number]     Arrival date & time 10/03/13  4098 History   First MD Initiated Contact with Patient 10/03/13 0507     Chief Complaint  Patient presents with  . Shortness of Breath    Patient is a 80 y.o. female presenting with shortness of breath. The history is provided by the EMS personnel. The history is limited by the condition of the patient.  Shortness of Breath Severity:  Severe Onset quality:  Unable to specify Timing:  Constant Progression:  Worsening Chronicity:  Recurrent Relieved by:  Nothing Worsened by:  Nothing tried Ineffective treatments: nebulizers. pt presents from nursing home for shortness of breath It is unclear when this episode occurred, EMS was called by staff and patient was in distress and was given nebulizers without improvement No other details are known at arrival  Past Medical History  Diagnosis Date  . Hypertension   . High cholesterol   . Arthritis   . Hypothyroidism   . Arrhythmia     pacemaker  . Glaucoma   . Pulmonary embolism 06/04/11  . Coronary artery disease   . Atrial fibrillation   . 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 Anxiety   . COPD (chronic obstructive pulmonary disease)   . Compression fracture    Past Surgical History  Procedure Laterality Date  . Total hip arthroplasty      right side  . Pacemaker insertion    . Appendectomy    . Abdominal hysterectomy     Family History  Problem Relation Age of Onset  . Heart attack Mother   . Cancer Father     stomach  . Stroke Sister   . Stroke Brother    History  Substance Use Topics  . Smoking status: Never Smoker   . Smokeless tobacco: Never Used  . Alcohol Use: No   OB History   Grav Para Term Preterm Abortions TAB SAB Ect Mult Living                 Review of Systems  Unable to perform ROS: Severe respiratory distress  Respiratory: Positive for shortness of breath.     Allergies  Naproxen; Sulfa antibiotics; and Robaxin  Home Medications    Current Outpatient Rx  Name  Route  Sig  Dispense  Refill  . acetaminophen (TYLENOL) 325 MG tablet   Oral   Take 650 mg by mouth every 6 (six) hours as needed for mild pain or fever.          Marland Kitchen albuterol (PROVENTIL) (2.5 MG/3ML) 0.083% nebulizer solution   Nebulization   Take 2.5 mg by nebulization 3 (three) times daily.         . Alum & Mag Hydroxide-Simeth (MAGIC MOUTHWASH) SOLN   Oral   Take 10 mLs by mouth 4 (four) times daily as needed.         Marland Kitchen amiodarone (PACERONE) 200 MG tablet   Oral   Take 200 mg by mouth daily.          . bimatoprost (LUMIGAN) 0.01 % SOLN   Both Eyes   Place 1 drop into both eyes at bedtime.          . brimonidine (ALPHAGAN P) 0.1 % SOLN   Both Eyes   Place 1 drop into both eyes 2 (two) times daily.         . Calcium Carbonate-Vitamin D (CALCIUM 600 + D PO)   Oral   Take 1  tablet by mouth 2 (two) times daily.         Marland Kitchen. docusate sodium (COLACE) 100 MG capsule   Oral   Take 100 mg by mouth 2 (two) times daily.         . dorzolamide (TRUSOPT) 2 % ophthalmic solution   Both Eyes   Place 1 drop into both eyes 2 (two) times daily.         . DULoxetine (CYMBALTA) 30 MG capsule   Oral   Take 30 mg by mouth daily.         . fentaNYL (DURAGESIC - DOSED MCG/HR) 12 MCG/HR   Transdermal   Place 12.5 mcg onto the skin every 3 (three) days.         . furosemide (LASIX) 80 MG tablet   Oral   Take 1 tablet (80 mg total) by mouth 2 (two) times daily.   60 tablet   0   . gabapentin (NEURONTIN) 300 MG capsule   Oral   Take 1 capsule (300 mg total) by mouth 3 (three) times daily.   30 capsule   0   . guaiFENesin (MUCINEX) 600 MG 12 hr tablet   Oral   Take 1 tablet (600 mg total) by mouth 2 (two) times daily.   14 tablet   0   . HYDROcodone-acetaminophen (NORCO/VICODIN) 5-325 MG per tablet   Oral   Take 1 tablet by mouth every 4 (four) hours as needed. pain         . hydrOXYzine (VISTARIL) 25 MG capsule   Oral    Take 1 capsule by mouth daily as needed for anxiety.          Marland Kitchen. levothyroxine (SYNTHROID, LEVOTHROID) 100 MCG tablet   Oral   Take 100 mcg by mouth daily.         Marland Kitchen. LORazepam (ATIVAN) 0.5 MG tablet   Oral   Take 0.5 mg by mouth 3 (three) times daily.         . metoprolol (LOPRESSOR) 50 MG tablet   Oral   Take 50 mg by mouth every morning.         . ondansetron (ZOFRAN) 4 MG tablet   Oral   Take 4 mg by mouth every 6 (six) hours as needed. For nausea         . polyethylene glycol powder (GLYCOLAX/MIRALAX) powder   Oral   Take 17 g by mouth daily.          . potassium chloride SA (K-DUR,KLOR-CON) 20 MEQ tablet   Oral   Take 2 tablets (40 mEq total) by mouth daily.         . pravastatin (PRAVACHOL) 20 MG tablet   Oral   Take 1 tablet (20 mg total) by mouth daily.   30 tablet   11   . predniSONE (DELTASONE) 10 MG tablet   Oral   Take 10-20 mg by mouth See admin instructions. 1. Take two tablets (20mg  total) by mouth three times daily for 2 days (1/11-1/13) 2. Take two tablets (20mg  total) by mouth twice daily for 2 days (1/14-1/15) 3. Take one tablet (10mg  total) by mouth three times daily for 2 days (1/16-1/17) 4. Take one tablet (10mg  total) by mouth twice daily for 2 days (1/18-1/19) 5. Take one tablet (10mg  total) by mouth once daily for 2 days (1/20-1/21)         . tiotropium (SPIRIVA) 18 MCG inhalation capsule   Inhalation  Place 18 mcg into inhaler and inhale daily.         Marland Kitchen trimethoprim (TRIMPEX) 100 MG tablet   Oral   Take 100 mg by mouth at bedtime.         Marland Kitchen warfarin (COUMADIN) 4 MG tablet   Oral   Take 2-4 mg by mouth daily. Take 1/2 tablet (2mg ) daily except 1 tablet (4mg ) on Mon & Fri.          BP 179/94  Pulse 84  Resp 44  SpO2 100% BP 136/66  Pulse 75  Temp(Src) 100.1 F (37.8 C) (Rectal)  Resp 16  SpO2 100%  Physical Exam CONSTITUTIONAL: elderly, distress noted HEAD: Normocephalic/atraumatic EYES:  EOMI/PERRL ENMT: Mucous membranes moist NECK: supple no meningeal signs SPINE:entire spine nontender CV: distant heart sounds noted LUNGS: tachypnea noted, distress noted, wheezing noted bilaterally ABDOMEN: soft, nontender, no rebound or guarding GU:no cva tenderness NEURO: Pt is awake/alert, moves all extremitiesx4 EXTREMITIES: pulses normal, full ROM SKIN: warm, color normal PSYCH: anxious  ED Course  Procedures  CRITICAL CARE Performed by: Joya Gaskins Total critical care time: 40 Critical care time was exclusive of separately billable procedures and treating other patients. Critical care was necessary to treat or prevent imminent or life-threatening deterioration. Critical care was time spent personally by me on the following activities: development of treatment plan with patient and/or surrogate as well as nursing, discussions with consultants, evaluation of patient's response to treatment, examination of patient, obtaining history from patient or surrogate, ordering and performing treatments and interventions, ordering and review of laboratory studies, ordering and review of radiographic studies, pulse oximetry and re-evaluation of patient's condition.  5:36 AM I was called to room by nursing due to severe resp distress bipap was placed and pt began to improve nebulizers have been ordered Will folllow patient closely 6:02 AM Pt appears to be tolerating bipap mask Will continue to follow 6:44 AM Pt stabilized in the ED, tolerating bipap She denies any pain at this time I suspect this is likely related to underlying COPD, though no significant CO2 retention Will admit 6:52 AM D/w dr Osvaldo Shipper, will admit to stepdown Labs Review Labs Reviewed  BASIC METABOLIC PANEL  CBC WITH DIFFERENTIAL  PRO B NATRIURETIC PEPTIDE  PROTIME-INR  BLOOD GAS, ARTERIAL   Imaging Review No results found.  EKG Interpretation   None      Date: 10/03/2013 0522am  Rate: 77   Rhythm: indeterminate  QRS Axis: left  Intervals: normal  ST/T Wave abnormalities: nonspecific ST changes  Conduction Disutrbances:ventricular paced rhythm   Date: 10/03/2013 0558am  Rate: 85  Rhythm: indeterminate  QRS Axis: left  Intervals: normal  ST/T Wave abnormalities: nonspecific ST changes  Conduction Disutrbances:ventricular paced rhythm  Narrative Interpretation:   Old EKG Reviewed: similar to prior EKGs    MDM  No diagnosis found. Nursing notes including past medical history and social history reviewed and considered in documentation xrays reviewed and considered Labs/vital reviewed and considered Previous records reviewed and considered - previous d/c summary reviewed, h/o COPD     Joya Gaskins, MD 10/03/13 [phone]

## 2013-10-03 NOTE — ED Notes (Signed)
Repeat EKG done now that pt is calmer

## 2013-10-03 NOTE — ED Notes (Signed)
[facility] [place]

## 2013-10-04 DIAGNOSIS — J9620 Acute and chronic respiratory failure, unspecified whether with hypoxia or hypercapnia: Secondary | ICD-10-CM | POA: Diagnosis present

## 2013-10-04 DIAGNOSIS — J449 Chronic obstructive pulmonary disease, unspecified: Secondary | ICD-10-CM

## 2013-10-04 DIAGNOSIS — J111 Influenza due to unidentified influenza virus with other respiratory manifestations: Secondary | ICD-10-CM

## 2013-10-04 DIAGNOSIS — J101 Influenza due to other identified influenza virus with other respiratory manifestations: Secondary | ICD-10-CM | POA: Diagnosis present

## 2013-10-04 LAB — BASIC METABOLIC PANEL
BUN: 25 mg/dL — ABNORMAL HIGH (ref 6–23)
CALCIUM: 9 mg/dL (ref 8.4–10.5)
CHLORIDE: 99 meq/L (ref 96–112)
CO2: 25 meq/L (ref 19–32)
Creatinine, Ser: 1.6 mg/dL — ABNORMAL HIGH (ref 0.50–1.10)
GFR calc Af Amer: 33 mL/min — ABNORMAL LOW (ref >90)
GFR calc non Af Amer: 28 mL/min — ABNORMAL LOW (ref >90)
GLUCOSE: 174 mg/dL — AB (ref 70–99)
Potassium: 4 mEq/L (ref 3.7–5.3)
SODIUM: 137 meq/L (ref 137–147)

## 2013-10-04 LAB — GLUCOSE, CAPILLARY
GLUCOSE-CAPILLARY: 221 mg/dL — AB (ref 70–99)
Glucose-Capillary: 222 mg/dL — ABNORMAL HIGH (ref 70–99)
Glucose-Capillary: 132 mg/dL — ABNORMAL HIGH (ref 70–99)
Glucose-Capillary: 120 mg/dL — ABNORMAL HIGH (ref 70–99)

## 2013-10-04 LAB — HEPATIC FUNCTION PANEL
ALK PHOS: 61 U/L (ref 39–117)
ALT: 18 U/L (ref 0–35)
AST: 26 U/L (ref 0–37)
Albumin: 2.9 g/dL — ABNORMAL LOW (ref 3.5–5.2)
BILIRUBIN TOTAL: 0.3 mg/dL (ref 0.3–1.2)
Total Protein: 6.2 g/dL (ref 6.0–8.3)

## 2013-10-04 LAB — BLOOD GAS, ARTERIAL
Acid-base deficit: 0.8 mmol/L (ref 0.0–2.0)
BICARBONATE: 23.9 meq/L (ref 20.0–24.0)
Drawn by: 23534
O2 Content: 2 L/min
O2 Saturation: 92.3 %
PH ART: 7.362 (ref 7.350–7.450)
Patient temperature: 37
TCO2: 22.4 mmol/L (ref 0–100)
pCO2 arterial: 43.2 mmHg (ref 35.0–45.0)
pO2, Arterial: 66.8 mmHg — ABNORMAL LOW (ref 80.0–100.0)

## 2013-10-04 LAB — PROTIME-INR
INR: 1.36 (ref 0.00–1.49)
Prothrombin Time: 16.4 seconds — ABNORMAL HIGH (ref 11.6–15.2)

## 2013-10-04 LAB — URINE CULTURE
Colony Count: NO GROWTH
Culture: NO GROWTH

## 2013-10-04 LAB — CBC
HEMATOCRIT: 33.3 % — AB (ref 36.0–46.0)
Hemoglobin: 10.2 g/dL — ABNORMAL LOW (ref 12.0–15.0)
MCH: 23.6 pg — AB (ref 26.0–34.0)
MCHC: 30.6 g/dL (ref 30.0–36.0)
MCV: 76.9 fL — AB (ref 78.0–100.0)
PLATELETS: 150 10*3/uL (ref 150–400)
RBC: 4.33 MIL/uL (ref 3.87–5.11)
RDW: 19.8 % — ABNORMAL HIGH (ref 11.5–15.5)
WBC: 4.9 10*3/uL (ref 4.0–10.5)

## 2013-10-04 MED ORDER — LORAZEPAM 2 MG/ML IJ SOLN
1.0000 mg | Freq: Once | INTRAMUSCULAR | Status: AC
  Administered 2013-10-04: 1 mg via INTRAVENOUS
  Filled 2013-10-04: qty 1

## 2013-10-04 MED ORDER — APIXABAN 5 MG PO TABS
2.5000 mg | ORAL_TABLET | Freq: Two times a day (BID) | ORAL | Status: DC
  Administered 2013-10-04 (×2): 2.5 mg via ORAL
  Administered 2013-10-05: 11:00:00 via ORAL
  Administered 2013-10-06 – 2013-10-08 (×6): 2.5 mg via ORAL
  Filled 2013-10-04 (×12): qty 1

## 2013-10-04 MED ORDER — WARFARIN SODIUM 5 MG PO TABS: 5.0000 mg | ORAL_TABLET | Freq: Once | ORAL | Status: DC

## 2013-10-04 NOTE — Progress Notes (Signed)
PT IS VERY AGITATED AND PULLING OFF CLOTHES AND LINES FOR THE PAST 2HRS. MUCH REASSURANCE AND EMOTIONAL SUPPORT GIVE.

## 2013-10-04 NOTE — Progress Notes (Signed)
ANTICOAGULATION CONSULT NOTE - follow up  Pharmacy Consult for Coumadin (chronic Rx PTA) Indication: atrial fibrillation  Allergies  Allergen Reactions  . Naproxen     Patient on coumadin  . Sulfa Antibiotics   . Robaxin [Methocarbamol]     Causes patient to be confused   Patient Measurements: Height: 5\' 3"  (160 cm) Weight: 148 lb 9.4 oz (67.4 kg) IBW/kg (Calculated) : 52.4  Vital Signs: Temp: 98.2 F (36.8 C) (02/01 0500) Temp src: Axillary (02/01 0000) BP: 111/79 mmHg (02/01 0700) Pulse Rate: 70 (02/01 0700)  Labs:  Recent Labs  10/03/13 0530 10/03/13 0607 10/04/13 0502  HGB 11.4*  --  10.2*  HCT 36.8  --  33.3*  PLT 158  --  150  LABPROT 17.2*  --  16.4*  INR 1.44  --  1.36  CREATININE 1.96*  --  1.60*  TROPONINI  --  <0.30  --    Estimated Creatinine Clearance: 24.1 ml/min (by C-G formula based on Cr of 1.6).  Medical History: Past Medical History  Diagnosis Date  . Hypertension   . High cholesterol   . Arthritis   . Hypothyroidism   . Arrhythmia     pacemaker  . Glaucoma   . Pulmonary embolism 06/04/11  . Coronary artery disease   . Atrial fibrillation   . 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 Anxiety   . COPD (chronic obstructive pulmonary disease)   . Compression fracture   . Chronic kidney disease     Stage III. Baseline creatinine 1.6-2.0.  . Chronic pain syndrome 09/06/2008    Qualifier: Diagnosis of  By: Romeo AppleHarrison MD, Duffy RhodyStanley    . Influenza A virus present 10/03/2013   Medications:  Prescriptions prior to admission  Medication Sig Dispense Refill  . albuterol (PROVENTIL) (2.5 MG/3ML) 0.083% nebulizer solution Take 2.5 mg by nebulization 3 (three) times daily.      Marland Kitchen. amiodarone (PACERONE) 200 MG tablet Take 200 mg by mouth daily.       Marland Kitchen. amoxicillin (AMOXIL) 500 MG capsule Take 2,000 mg by mouth as directed.      Marland Kitchen. apixaban (ELIQUIS) 2.5 MG TABS tablet Take 2.5 mg by mouth 2 (two) times daily.      . bimatoprost (LUMIGAN) 0.01 % SOLN  Place 1 drop into both eyes at bedtime.       . brimonidine (ALPHAGAN P) 0.1 % SOLN Place 1 drop into both eyes 2 (two) times daily.      . fentaNYL (DURAGESIC - DOSED MCG/HR) 12 MCG/HR Place 12.5 mcg onto the skin every 3 (three) days. Rotating site      . GuaiFENesin (TUSSIN EXPECTORANT PO) Take 10 mLs by mouth 4 (four) times daily as needed. Cough/congestion      . HYDROcodone-acetaminophen (NORCO/VICODIN) 5-325 MG per tablet Take 1 tablet by mouth 3 (three) times daily as needed for moderate pain. pain      . levofloxacin (LEVAQUIN) 500 MG tablet Take 500 mg by mouth every other day. For 10 days (started on 10/01/13)      . LORazepam (ATIVAN) 0.5 MG tablet Take 0.25 mg by mouth 3 (three) times daily as needed for anxiety. anxiety      . sertraline (ZOLOFT) 25 MG tablet Take 25 mg by mouth daily.      Marland Kitchen. acetaminophen (TYLENOL) 325 MG tablet Take 650 mg by mouth every 6 (six) hours as needed for mild pain or fever.       . Alum &  Mag Hydroxide-Simeth (MAGIC MOUTHWASH) SOLN Take 10 mLs by mouth 4 (four) times daily as needed.      . Calcium Carbonate-Vitamin D (CALCIUM 600 + D PO) Take 1 tablet by mouth 2 (two) times daily.      Marland Kitchen docusate sodium (COLACE) 100 MG capsule Take 100 mg by mouth 2 (two) times daily.      . dorzolamide (TRUSOPT) 2 % ophthalmic solution Place 1 drop into both eyes 2 (two) times daily.      . furosemide (LASIX) 80 MG tablet Take 1 tablet (80 mg total) by mouth 2 (two) times daily.  60 tablet  0  . gabapentin (NEURONTIN) 300 MG capsule Take 1 capsule (300 mg total) by mouth 3 (three) times daily.  30 capsule  0  . levothyroxine (SYNTHROID, LEVOTHROID) 100 MCG tablet Take 100 mcg by mouth daily.      . metoprolol (LOPRESSOR) 50 MG tablet Take 50 mg by mouth every morning.      . ondansetron (ZOFRAN) 4 MG tablet Take 4 mg by mouth every 6 (six) hours as needed. For nausea      . polyethylene glycol powder (GLYCOLAX/MIRALAX) powder Take 17 g by mouth daily.       . potassium  chloride SA (K-DUR,KLOR-CON) 20 MEQ tablet Take 2 tablets (40 mEq total) by mouth daily.      Marland Kitchen tiotropium (SPIRIVA) 18 MCG inhalation capsule Place 18 mcg into inhaler and inhale daily.      Marland Kitchen trimethoprim (TRIMPEX) 100 MG tablet Take 100 mg by mouth at bedtime.       [name]  Initially Coumadin was listed as PTA medication but updated list indicates pt was on Eliquis 2.5mg  po BID PTA and not Coumadin.  INR sub-therapeutic on admission and trending down.  Pt is also on amiodarone and Levaquin which can interact with coumadin to increase INR.  Goal of Therapy:  INR 2-3 Monitor platelets by anticoagulation protocol: Yes   Plan:  Coumadin 5mg  po today x 1 to boost INR INR daily * Clarify desired oral anticoagulation - Eliquis vs Coumadin *  Wendi Lastra A 10/04/2013,8:14 AM

## 2013-10-04 NOTE — Progress Notes (Signed)
[facility] PROGRESS NOTE  [name] ZOX:[id number] DOB: [birth year] DOA: 10/03/2013 PCP: Cassell SmilesFUSCO,LAWRENCE J., MD  Assessment/Plan: 1. Acute on chronic respiratory failure. Due to COPD exacerbation and influenza. Patient briefly required BiPAP on admission. She subsequently down to nasal cannula oxygen. Continue to wean down oxygen as tolerated. 2. COPD exacerbation. Continue steroids, nebulizer treatments and antibiotics. 3. Influenza B. Patient has tested positive for influenza. She is on Tamiflu and droplet precautions. 4. On chronic diastolic congestive heart failure. Appears compensated. Continue outpatient dose of Lasix 5. Chronic kidney disease stage III. Creatinine is at baseline. 6. Hypothyroidism. Continue Synthroid 7. Hypertension. Stable 8. Chronic pain syndrome. We'll she's on her chronic pain medications. 9. Paroxysmal atrial fibrillation. Currently her heart rate is stable. Review of prior to admission medication list indicates that she is receiving apixiban for anticoagulation. We will continue this and discontinue Coumadin.  Code Status: DNR Family Communication: no family present Disposition Plan: discharge back to ALF when improved, transfer telemetry   Consultants:  none  Procedures:  none  Antibiotics:  levaquin 10/03/13  Vancomycin 10/03/13>>10/04/13  Cefepime 10/03/13>>10/04/13  HPI/Subjective: Still feels short of breath, no productive cough  Objective: Filed Vitals:   10/04/13 1000  BP: 130/57  Pulse:   Temp:   Resp: 21    Intake/Output Summary (Last 24 hours) at 10/04/13 1052 Last data filed at 10/04/13 0900  Gross per 24 hour  Intake   2490 ml  Output   1250 ml  Net   1240 ml   Filed Weights   10/03/13 0758 10/04/13 0500  Weight: 69.3 kg (152 lb 12.5 oz) 67.4 kg (148 lb 9.4 oz)    Exam:   General:  Awake, alert, mild increased work of breathing, can carry on conversation  Cardiovascular: s1, s2, rrr  Respiratory:  bilateral crackles and coarse breath sounds bilaterally  Abdomen: soft, nt, nd, bs+  Musculoskeletal:  No edema b/l   Data Reviewed: Basic Metabolic Panel:  Recent Labs Lab 10/03/13 0530 10/04/13 0502  NA 140 137  K 4.0 4.0  CL 101 99  CO2 26 25  GLUCOSE 100* 174*  BUN 24* 25*  CREATININE 1.96* 1.60*  CALCIUM 9.5 9.0   Liver Function Tests:  Recent Labs Lab 10/04/13 0502  AST 26  ALT 18  ALKPHOS 61  BILITOT 0.3  PROT 6.2  ALBUMIN 2.9*   No results found for this basename: LIPASE, AMYLASE,  in the last 168 hours No results found for this basename: AMMONIA,  in the last 168 hours CBC:  Recent Labs Lab 10/03/13 0530 10/04/13 0502  WBC 4.2 4.9  NEUTROABS 2.2  --   HGB 11.4* 10.2*  HCT 36.8 33.3*  MCV 76.7* 76.9*  PLT 158 150   Cardiac Enzymes:  Recent Labs Lab 10/03/13 0607  TROPONINI <0.30   BNP (last 3 results)  Recent Labs  08/03/13 1042 09/01/13 1455 10/03/13 0530  PROBNP 1912.0* 1793.0* 1700.0*   CBG:  Recent Labs Lab 10/03/13 1149 10/03/13 2045 10/04/13 0759  GLUCAP 291* 130* 221*    Recent Results (from the past 240 hour(s))  MRSA PCR SCREENING     Status: None   Collection Time    10/03/13  7:58 AM      Result Value Range Status   MRSA by PCR NEGATIVE  NEGATIVE Final   Comment:            The GeneXpert MRSA Assay (FDA     approved for NASAL specimens  only), is one component of a     comprehensive MRSA colonization     surveillance program. It is not     intended to diagnose MRSA     infection nor to guide or     monitor treatment for     MRSA infections.     Studies: Dg Chest Portable 1 View  10/03/2013   CLINICAL DATA:  Shortness of breath  EXAM: PORTABLE CHEST - 1 VIEW  COMPARISON:  09/09/2013  FINDINGS: Stable appearance of left approach dual-chamber pacer leads.  Cardiomegaly which is chronic. Mediastinal contours are distorted by leftward rotation.  Low lung volumes with streaky basilar opacities. Elevation of  the left diaphragm is presumably from volume loss. No evidence of effusion or pneumothorax. No overt edema.  IMPRESSION: Low lung volumes with bibasilar atelectasis.   Electronically Signed   By: Tiburcio Pea M.D.   On: 10/03/2013 06:02    Scheduled Meds: . amiodarone  200 mg Oral Daily  . antiseptic oral rinse  15 mL Mouth Rinse q12n4p  . apixaban  2.5 mg Oral BID  . benzonatate  100 mg Oral TID  . brimonidine  1 drop Both Eyes BID AC  . chlorhexidine  15 mL Mouth Rinse BID  . docusate sodium  100 mg Oral BID  . dorzolamide  1 drop Both Eyes BID  . DULoxetine  30 mg Oral Daily  . famotidine  20 mg Oral Daily  . fentaNYL  12.5 mcg Transdermal Q72H  . furosemide  80 mg Oral BID  . gabapentin  300 mg Oral TID  . guaiFENesin  600 mg Oral BID  . insulin aspart  0-15 Units Subcutaneous TID WC  . insulin aspart  0-5 Units Subcutaneous QHS  . insulin glargine  10 Units Subcutaneous QHS  . ipratropium  0.5 mg Nebulization Q4H  . latanoprost  1 drop Both Eyes QHS  . levalbuterol  0.63 mg Nebulization Q4H  . [START ON 10/05/2013] levofloxacin (LEVAQUIN) IV  500 mg Intravenous Q48H  . levothyroxine  100 mcg Oral QAC breakfast  . LORazepam  0.5 mg Oral TID  . methylPREDNISolone (SOLU-MEDROL) injection  60 mg Intravenous Q6H  . oseltamivir  75 mg Oral Daily  . polyethylene glycol  17 g Oral Daily  . potassium chloride SA  40 mEq Oral Daily  . simvastatin  10 mg Oral q1800  . trimethoprim  100 mg Oral QHS   Continuous Infusions:   Principal Problem:   COPD exacerbation Active Problems:   Chronic pain syndrome   PAF (paroxysmal atrial fibrillation)   HTN (hypertension)   Hypothyroidism   Spinal stenosis of lumbar region   Long term (current) use of anticoagulants   Chronic diastolic heart failure   Pacemaker - dual chamber Medtronic 2006, gen change 2012   Acute respiratory distress   Stage III chronic kidney disease   Anemia   Influenza A virus present    Time spent:     [facility]  [facility] Pager [phone]. If 7PM-7AM, please contact night-coverage at [email or web address], password [facility] 10/04/2013, 10:52 AM  LOS: 1 day

## 2013-10-04 NOTE — Progress Notes (Signed)
ANTICOAGULATION CONSULT NOTE - follow up  Pharmacy Consult for Eliquis (chronic Rx PTA) Indication: atrial fibrillation  Allergies  Allergen Reactions  . Naproxen     Patient on coumadin  . Sulfa Antibiotics   . Robaxin [Methocarbamol]     Causes patient to be confused   Patient Measurements: Height: 5\' 3"  (160 cm) Weight: 148 lb 9.4 oz (67.4 kg) IBW/kg (Calculated) : 52.4  Vital Signs: Temp: 98.7 F (37.1 C) (02/01 0730) Temp src: Oral (02/01 0730) BP: 130/57 mmHg (02/01 1000) Pulse Rate: 72 (02/01 0800)  Labs:  Recent Labs  10/03/13 0530 10/03/13 0607 10/04/13 0502  HGB 11.4*  --  10.2*  HCT 36.8  --  33.3*  PLT 158  --  150  LABPROT 17.2*  --  16.4*  INR 1.44  --  1.36  CREATININE 1.96*  --  1.60*  TROPONINI  --  <0.30  --    Estimated Creatinine Clearance: 24.1 ml/min (by C-G formula based on Cr of 1.6).  Medical History: Past Medical History  Diagnosis Date  . Hypertension   . High cholesterol   . Arthritis   . Hypothyroidism   . Arrhythmia     pacemaker  . Glaucoma   . Pulmonary embolism 06/04/11  . Coronary artery disease   . Atrial fibrillation   . 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 Anxiety   . COPD (chronic obstructive pulmonary disease)   . Compression fracture   . Chronic kidney disease     Stage III. Baseline creatinine 1.6-2.0.  . Chronic pain syndrome 09/06/2008    Qualifier: Diagnosis of  By: Romeo Apple MD, Duffy Rhody    . Influenza A virus present 10/03/2013   Medications:  Prescriptions prior to admission  Medication Sig Dispense Refill  . albuterol (PROVENTIL) (2.5 MG/3ML) 0.083% nebulizer solution Take 2.5 mg by nebulization 3 (three) times daily.      Marland Kitchen amiodarone (PACERONE) 200 MG tablet Take 200 mg by mouth daily.       Marland Kitchen amoxicillin (AMOXIL) 500 MG capsule Take 2,000 mg by mouth as directed.      Marland Kitchen apixaban (ELIQUIS) 2.5 MG TABS tablet Take 2.5 mg by mouth 2 (two) times daily.      . bimatoprost (LUMIGAN) 0.01 % SOLN Place  1 drop into both eyes at bedtime.       . brimonidine (ALPHAGAN P) 0.1 % SOLN Place 1 drop into both eyes 2 (two) times daily.      . fentaNYL (DURAGESIC - DOSED MCG/HR) 12 MCG/HR Place 12.5 mcg onto the skin every 3 (three) days. Rotating site      . GuaiFENesin (TUSSIN EXPECTORANT PO) Take 10 mLs by mouth 4 (four) times daily as needed. Cough/congestion      . HYDROcodone-acetaminophen (NORCO/VICODIN) 5-325 MG per tablet Take 1 tablet by mouth 3 (three) times daily as needed for moderate pain. pain      . levofloxacin (LEVAQUIN) 500 MG tablet Take 500 mg by mouth every other day. For 10 days (started on 10/01/13)      . LORazepam (ATIVAN) 0.5 MG tablet Take 0.25 mg by mouth 3 (three) times daily as needed for anxiety. anxiety      . sertraline (ZOLOFT) 25 MG tablet Take 25 mg by mouth daily.      Marland Kitchen acetaminophen (TYLENOL) 325 MG tablet Take 650 mg by mouth every 6 (six) hours as needed for mild pain or fever.       . Alum &  Mag Hydroxide-Simeth (MAGIC MOUTHWASH) SOLN Take 10 mLs by mouth 4 (four) times daily as needed.      . Calcium Carbonate-Vitamin D (CALCIUM 600 + D PO) Take 1 tablet by mouth 2 (two) times daily.      Marland Kitchen. docusate sodium (COLACE) 100 MG capsule Take 100 mg by mouth 2 (two) times daily.      . dorzolamide (TRUSOPT) 2 % ophthalmic solution Place 1 drop into both eyes 2 (two) times daily.      . furosemide (LASIX) 80 MG tablet Take 1 tablet (80 mg total) by mouth 2 (two) times daily.  60 tablet  0  . gabapentin (NEURONTIN) 300 MG capsule Take 1 capsule (300 mg total) by mouth 3 (three) times daily.  30 capsule  0  . levothyroxine (SYNTHROID, LEVOTHROID) 100 MCG tablet Take 100 mcg by mouth daily.      . metoprolol (LOPRESSOR) 50 MG tablet Take 50 mg by mouth every morning.      . ondansetron (ZOFRAN) 4 MG tablet Take 4 mg by mouth every 6 (six) hours as needed. For nausea      . polyethylene glycol powder (GLYCOLAX/MIRALAX) powder Take 17 g by mouth daily.       . potassium  chloride SA (K-DUR,KLOR-CON) 20 MEQ tablet Take 2 tablets (40 mEq total) by mouth daily.      Marland Kitchen. tiotropium (SPIRIVA) 18 MCG inhalation capsule Place 18 mcg into inhaler and inhale daily.      Marland Kitchen. trimethoprim (TRIMPEX) 100 MG tablet Take 100 mg by mouth at bedtime.       Assessment: 80yo female on Eliquis PTA for h/o afib.  Initially Coumadin was listed as PTA medication but updated list indicates pt was actually on Eliquis 2.5mg  po BID PTA and not Coumadin.  Eliquis will resumed and Coumadin d/c'd.  Goal of Therapy:  INR 2-3 Monitor platelets by anticoagulation protocol: Yes   Plan:  Eliquis 2.5mg  PO BID  Margo AyeHall, Skylarr Liz A 10/04/2013,10:43 AM

## 2013-10-05 ENCOUNTER — Inpatient Hospital Stay (HOSPITAL_COMMUNITY): Payer: Medicare Other

## 2013-10-05 DIAGNOSIS — R404 Transient alteration of awareness: Secondary | ICD-10-CM

## 2013-10-05 DIAGNOSIS — R410 Disorientation, unspecified: Secondary | ICD-10-CM | POA: Diagnosis not present

## 2013-10-05 LAB — GLUCOSE, CAPILLARY
GLUCOSE-CAPILLARY: 175 mg/dL — AB (ref 70–99)
GLUCOSE-CAPILLARY: 130 mg/dL — AB (ref 70–99)
Glucose-Capillary: 137 mg/dL — ABNORMAL HIGH (ref 70–99)
Glucose-Capillary: 160 mg/dL — ABNORMAL HIGH (ref 70–99)

## 2013-10-05 LAB — CBC
HCT: 35.9 % — ABNORMAL LOW (ref 36.0–46.0)
Hemoglobin: 11 g/dL — ABNORMAL LOW (ref 12.0–15.0)
MCH: 23.5 pg — ABNORMAL LOW (ref 26.0–34.0)
MCHC: 30.6 g/dL (ref 30.0–36.0)
MCV: 76.7 fL — ABNORMAL LOW (ref 78.0–100.0)
PLATELETS: 173 10*3/uL (ref 150–400)
RBC: 4.68 MIL/uL (ref 3.87–5.11)
RDW: 20 % — AB (ref 11.5–15.5)
WBC: 8.1 10*3/uL (ref 4.0–10.5)

## 2013-10-05 LAB — BASIC METABOLIC PANEL
BUN: 33 mg/dL — ABNORMAL HIGH (ref 6–23)
CALCIUM: 9.7 mg/dL (ref 8.4–10.5)
CO2: 26 mEq/L (ref 19–32)
CREATININE: 1.64 mg/dL — AB (ref 0.50–1.10)
Chloride: 103 mEq/L (ref 96–112)
GFR, EST AFRICAN AMERICAN: 32 mL/min — AB (ref >90)
GFR, EST NON AFRICAN AMERICAN: 28 mL/min — AB (ref >90)
Glucose, Bld: 144 mg/dL — ABNORMAL HIGH (ref 70–99)
Potassium: 4.1 mEq/L (ref 3.7–5.3)
Sodium: 143 mEq/L (ref 137–147)

## 2013-10-05 LAB — URINALYSIS, ROUTINE W REFLEX MICROSCOPIC
Bilirubin Urine: NEGATIVE
GLUCOSE, UA: NEGATIVE mg/dL
HGB URINE DIPSTICK: NEGATIVE
Ketones, ur: NEGATIVE mg/dL
Leukocytes, UA: NEGATIVE
Nitrite: NEGATIVE
Protein, ur: NEGATIVE mg/dL
SPECIFIC GRAVITY, URINE: 1.02 (ref 1.005–1.030)
UROBILINOGEN UA: 0.2 mg/dL (ref 0.0–1.0)
pH: 5.5 (ref 5.0–8.0)

## 2013-10-05 MED ORDER — METHYLPREDNISOLONE SODIUM SUCC 125 MG IJ SOLR
60.0000 mg | Freq: Two times a day (BID) | INTRAMUSCULAR | Status: DC
  Administered 2013-10-06 – 2013-10-07 (×3): 60 mg via INTRAVENOUS
  Administered 2013-10-07: 11:00:00 via INTRAVENOUS
  Administered 2013-10-08 – 2013-10-09 (×3): 60 mg via INTRAVENOUS
  Filled 2013-10-05 (×8): qty 2

## 2013-10-05 MED ORDER — HALOPERIDOL LACTATE 5 MG/ML IJ SOLN: 5.0000 mg | Freq: Four times a day (QID) | INTRAMUSCULAR | Status: DC | PRN

## 2013-10-05 MED ORDER — OSELTAMIVIR PHOSPHATE 30 MG PO CAPS
30.0000 mg | ORAL_CAPSULE | Freq: Every day | ORAL | Status: AC
  Administered 2013-10-05 – 2013-10-07 (×3): 30 mg via ORAL
  Filled 2013-10-05 (×3): qty 1

## 2013-10-05 MED ORDER — HYDROMORPHONE HCL PF 1 MG/ML IJ SOLN
0.5000 mg | Freq: Once | INTRAMUSCULAR | Status: AC
  Administered 2013-10-05: 0.5 mg via INTRAVENOUS
  Filled 2013-10-05: qty 1

## 2013-10-05 NOTE — Progress Notes (Signed)
Patient having increased confusion and labored breathing throughout the night. Patient had more difficulty swallowing pills and water during the shift.  Patient was asking for her mother" and trying to crawl out of the bed.  Patient was reoriented to place ad person but increasingly harder over the shift.

## 2013-10-05 NOTE — Care Management Note (Addendum)
° ° °  Page 1 of 1   10/09/2013     1:48:01 PM   CARE MANAGEMENT NOTE 10/09/2013  Patient:  [name],[name]   Account Number:  [id number]  Date Initiated:  10/05/2013  Documentation initiated by:  Sharrie RothmanBLACKWELL,TAMMY C  Subjective/Objective Assessment:   [facility] with [facility] and flu. Pt will return to facility when medically stable.     Action/Plan:   CSW to arrange discharge to facility when medically stable.   Anticipated DC Date:  10/08/2013   Anticipated DC Plan:  ASSISTED LIVING / REST HOME  In-house referral  Clinical Social Worker      DC Planning Services  CM consult      Choice offered to / List presented to:             Status of service:  Completed, signed off Medicare Important Message given?   (If response is NO, the following Medicare IM given date fields will be blank) Date Medicare IM given:   Date Additional Medicare IM given:    Discharge Disposition:  ASSISTED LIVING  Per UR Regulation:    If discussed at Long Length of Stay Meetings, dates discussed:   10/08/2013    Comments:  10/09/13 Rosemary HolmsAmy Jenna Routzahn RN BSN CM Pt still requiring O2. Per [facility], they prefer [employer] as O2 vender. Contact info for [employer] left on Sticky note in EPIC. RN Arline AspCindy also aware.  10/05/13 1250 Arlyss Queenammy Blackwell, RN BSN CM

## 2013-10-05 NOTE — Progress Notes (Signed)
Tamiflu has been renally dose adjusted to 30mg  po daily to complete 5 day course per manufacturer recommendations for CrCl<1930ml/min.  Pt's estimated CrCl ~ 20-5030ml/min.   Thanks, Junita PushMichelle Quanasia Defino, PharmD, BCPS 10/05/2013@8 :23 AM

## 2013-10-05 NOTE — Progress Notes (Signed)
[facility] PROGRESS NOTE  [name]:[id number] DOB: 06-03-1927 DOA: 10/03/2013 PCP: Cassell Smiles., MD  Assessment/Plan: 1. Acute on chronic respiratory failure. Due to COPD exacerbation and influenza. Patient briefly required BiPAP on admission. She subsequently down to nasal cannula oxygen. Continue to wean down oxygen as tolerated. 2. Acute delirium. Etiology is not entirely clear. Check urinalysis as well as repeat chest xray. ?related to high dose steroids. Will start to wean steroids.  Start haldol as needed for agitation. 3. COPD exacerbation. Continue steroids, nebulizer treatments and antibiotics. Start to wean steroids. 4. Influenza B. Patient has tested positive for influenza. She is on Tamiflu and droplet precautions. 5. Chronic diastolic congestive heart failure. Appears compensated. Continue outpatient dose of Lasix 6. Chronic kidney disease stage III. Creatinine is at baseline. 7. Hypothyroidism. Continue Synthroid 8. Hypertension. Stable 9. Chronic pain syndrome. She's on her chronic pain medications. 10. Paroxysmal atrial fibrillation. Currently her heart rate is stable. Review of prior to admission medication list indicates that she is receiving apixiban for anticoagulation. We will continue this and discontinue Coumadin.  Code Status: DNR Family Communication: no family present Disposition Plan: discharge back to ALF when improved, transfer telemetry   Consultants:  none  Procedures:  none  Antibiotics:  levaquin 10/03/13  Vancomycin 10/03/13>>10/04/13  Cefepime 10/03/13>>10/04/13  HPI/Subjective: Patient has become confused and agitated since yesterday, repeatedly trying to get out of bed and pulling at lines.  Objective: Filed Vitals:   10/05/13 1215  BP:   Pulse:   Temp: 97.5 F (36.4 C)  Resp:     Intake/Output Summary (Last 24 hours) at 10/05/13 1254 Last data filed at 10/05/13 0500  Gross per 24 hour  Intake    660 ml  Output    1150 ml  Net   -490 ml   Filed Weights   10/03/13 0758 10/04/13 0500 10/05/13 0500  Weight: 69.3 kg (152 lb 12.5 oz) 67.4 kg (148 lb 9.4 oz) 68.2 kg (150 lb 5.7 oz)    Exam:   General:  Confused, agitated, does not answer questions  Cardiovascular: s1, s2, rrr  Respiratory: mostly clear bilaterally, with mild exp wheeze  Abdomen: soft, nt, nd, bs+  Musculoskeletal:  No edema b/l   Data Reviewed: Basic Metabolic Panel:  Recent Labs Lab 10/03/13 0530 10/04/13 0502 10/05/13 0446  NA 140 137 143  K 4.0 4.0 4.1  CL 101 99 103  CO2 26 25 26   GLUCOSE 100* 174* 144*  BUN 24* 25* 33*  CREATININE 1.96* 1.60* 1.64*  CALCIUM 9.5 9.0 9.7   Liver Function Tests:  Recent Labs Lab 10/04/13 0502  AST 26  ALT 18  ALKPHOS 61  BILITOT 0.3  PROT 6.2  ALBUMIN 2.9*   No results found for this basename: LIPASE, AMYLASE,  in the last 168 hours No results found for this basename: AMMONIA,  in the last 168 hours CBC:  Recent Labs Lab 10/03/13 0530 10/04/13 0502 10/05/13 0446  WBC 4.2 4.9 8.1  NEUTROABS 2.2  --   --   HGB 11.4* 10.2* 11.0*  HCT 36.8 33.3* 35.9*  MCV 76.7* 76.9* 76.7*  PLT 158 150 173   Cardiac Enzymes:  Recent Labs Lab 10/03/13 0607  TROPONINI <0.30   BNP (last 3 results)  Recent Labs  08/03/13 1042 09/01/13 1455 10/03/13 0530  PROBNP 1912.0* 1793.0* 1700.0*   CBG:  Recent Labs Lab 10/04/13 1132 10/04/13 1645 10/04/13 2052 10/05/13 0807 10/05/13 1202  GLUCAP 222* 132* 120* 137* 175*  Recent Results (from the past 240 hour(s))  URINE CULTURE     Status: None   Collection Time    10/03/13  6:37 AM      Result Value Range Status   Specimen Description URINE, CATHETERIZED   Final   Special Requests NONE   Final   Culture  Setup Time     Final   Value: 10/03/2013 21:30     Performed at [facility] Count     Final   Value: NO GROWTH     Performed at [facility]   Culture     Final   Value: NO  GROWTH     Performed at [facility]   Report Status 10/04/2013 FINAL   Final  MRSA PCR SCREENING     Status: None   Collection Time    10/03/13  7:58 AM      Result Value Range Status   MRSA by PCR NEGATIVE  NEGATIVE Final   Comment:            The GeneXpert MRSA Assay (FDA     approved for NASAL specimens     only), is one component of a     comprehensive MRSA colonization     surveillance program. It is not     intended to diagnose MRSA     infection nor to guide or     monitor treatment for     MRSA infections.     Studies: No results found.  Scheduled Meds: . amiodarone  200 mg Oral Daily  . antiseptic oral rinse  15 mL Mouth Rinse q12n4p  . apixaban  2.5 mg Oral BID  . benzonatate  100 mg Oral TID  . brimonidine  1 drop Both Eyes BID AC  . chlorhexidine  15 mL Mouth Rinse BID  . docusate sodium  100 mg Oral BID  . dorzolamide  1 drop Both Eyes BID  . DULoxetine  30 mg Oral Daily  . famotidine  20 mg Oral Daily  . fentaNYL  12.5 mcg Transdermal Q72H  . furosemide  80 mg Oral BID  . gabapentin  300 mg Oral TID  . guaiFENesin  600 mg Oral BID  . insulin aspart  0-15 Units Subcutaneous TID WC  . insulin aspart  0-5 Units Subcutaneous QHS  . insulin glargine  10 Units Subcutaneous QHS  . ipratropium  0.5 mg Nebulization Q4H  . latanoprost  1 drop Both Eyes QHS  . levalbuterol  0.63 mg Nebulization Q4H  . levofloxacin (LEVAQUIN) IV  500 mg Intravenous Q48H  . levothyroxine  100 mcg Oral QAC breakfast  . LORazepam  0.5 mg Oral TID  . methylPREDNISolone (SOLU-MEDROL) injection  60 mg Intravenous Q12H  . oseltamivir  30 mg Oral Daily  . polyethylene glycol  17 g Oral Daily  . potassium chloride SA  40 mEq Oral Daily  . simvastatin  10 mg Oral q1800  . trimethoprim  100 mg Oral QHS   Continuous Infusions:   Principal Problem:   COPD exacerbation Active Problems:   Chronic pain syndrome   PAF (paroxysmal atrial fibrillation)   HTN (hypertension)    Hypothyroidism   Spinal stenosis of lumbar region   Long term (current) use of anticoagulants   Chronic diastolic heart failure   Pacemaker - dual chamber Medtronic 2006, gen change 2012   Acute respiratory distress   Stage III chronic kidney disease   Anemia  Influenza B   Acute on chronic respiratory failure    Time spent: 35mins    [facility],JEHANZEB  [facility] Pager [phone]. If 7PM-7AM, please contact night-coverage at [email or web address], password [email or web address] 10/05/2013, 12:54 PM  LOS: 2 days

## 2013-10-05 NOTE — Clinical Social Work Psychosocial (Signed)
Clinical Social Work Department BRIEF PSYCHOSOCIAL ASSESSMENT 10/05/2013  Patient:  [name],[name]     Account Number:  [id number]     Admit date:  10/03/2013  Clinical Social Worker:  Nancie NeasSTULTZ,Kellyjo Edgren, LCSW  Date/Time:  10/05/2013 04:17 PM  Referred by:  CSW  Date Referred:  10/05/2013 Referred for  ALF Placement   Other Referral:   Interview type:  Family Other interview type:   son- [name]    PSYCHOSOCIAL DATA Living Status:  FACILITY Admitted from facility:  [facility] Level of care:  Assisted Living Primary support name:  [name] Primary support relationship to patient:  CHILD, ADULT Degree of support available:   supportive    CURRENT CONCERNS Current Concerns  Post-Acute Placement   Other Concerns:    SOCIAL WORK ASSESSMENT / PLAN CSW spoke with pt's son, [name] as pt is currently not oriented. Pt well known to CSW from previous admissions. She has [facility] for over a year and a half. [name] reports they noticed a change in pt's mental status recently. Pt came to ED with shortness of breath. Pt and family have been very [facility]. Son reports concern regarding whether pt can return and be managed there. She is an extensive assist with ADLs and primarily uses a wheelchair. Facility is willing for pt to return at this point per [facility]. Pt tested positive for influenza. Facility notified. CSW discussed with son that as pt approaches d/c, [facility] can assess [facility] to determine if higher level of care is necessary. He is agreeable to this plan, but is hopeful she can return there. Son had questions regarding hospitalization for MD. CSW notified MD that son would like to discuss.   Assessment/plan status:  Psychosocial Support/Ongoing Assessment of Needs Other assessment/ plan:   Information/referral to [facility] resources[facility]    PATIENT'S/FAMILY'S RESPONSE TO PLAN OF CARE: Pt unable to discuss plan of care due to  confusion. At baseline, pt is oriented x4. CSW will continue to follow.       Derenda FennelKara Aymara Sassi, Kentucky[place]CSW [phone]

## 2013-10-05 NOTE — Progress Notes (Signed)
UR chart review completed.  

## 2013-10-06 ENCOUNTER — Inpatient Hospital Stay (HOSPITAL_COMMUNITY): Payer: Medicare Other

## 2013-10-06 DIAGNOSIS — I10 Essential (primary) hypertension: Secondary | ICD-10-CM

## 2013-10-06 DIAGNOSIS — I4891 Unspecified atrial fibrillation: Secondary | ICD-10-CM

## 2013-10-06 LAB — BASIC METABOLIC PANEL
BUN: 30 mg/dL — ABNORMAL HIGH (ref 6–23)
CO2: 33 mEq/L — ABNORMAL HIGH (ref 19–32)
Calcium: 9.6 mg/dL (ref 8.4–10.5)
Chloride: 105 mEq/L (ref 96–112)
Creatinine, Ser: 1.32 mg/dL — ABNORMAL HIGH (ref 0.50–1.10)
GFR, EST AFRICAN AMERICAN: 42 mL/min — AB (ref >90)
GFR, EST NON AFRICAN AMERICAN: 36 mL/min — AB (ref >90)
GLUCOSE: 104 mg/dL — AB (ref 70–99)
POTASSIUM: 3.1 meq/L — AB (ref 3.7–5.3)
Sodium: 148 mEq/L — ABNORMAL HIGH (ref 137–147)

## 2013-10-06 LAB — CBC
HCT: 36.7 % (ref 36.0–46.0)
HEMOGLOBIN: 11.3 g/dL — AB (ref 12.0–15.0)
MCH: 23.4 pg — ABNORMAL LOW (ref 26.0–34.0)
MCHC: 30.8 g/dL (ref 30.0–36.0)
MCV: 76.1 fL — ABNORMAL LOW (ref 78.0–100.0)
PLATELETS: 166 10*3/uL (ref 150–400)
RBC: 4.82 MIL/uL (ref 3.87–5.11)
RDW: 19.9 % — ABNORMAL HIGH (ref 11.5–15.5)
WBC: 9.8 10*3/uL (ref 4.0–10.5)

## 2013-10-06 LAB — GLUCOSE, CAPILLARY
GLUCOSE-CAPILLARY: 92 mg/dL (ref 70–99)
GLUCOSE-CAPILLARY: 120 mg/dL — AB (ref 70–99)
GLUCOSE-CAPILLARY: 146 mg/dL — AB (ref 70–99)
Glucose-Capillary: 166 mg/dL — ABNORMAL HIGH (ref 70–99)

## 2013-10-06 MED ORDER — SODIUM CHLORIDE 0.9 % IJ SOLN: 3.0000 mL | INTRAMUSCULAR | Status: DC | PRN

## 2013-10-06 MED ORDER — DEXTROSE-NACL 5-0.45 % IV SOLN: INTRAVENOUS | Status: DC

## 2013-10-06 MED ORDER — SODIUM CHLORIDE 0.9 % IJ SOLN
3.0000 mL | Freq: Two times a day (BID) | INTRAMUSCULAR | Status: DC
  Administered 2013-10-07 – 2013-10-11 (×7): 3 mL via INTRAVENOUS

## 2013-10-06 MED ORDER — POTASSIUM CHLORIDE CRYS ER 20 MEQ PO TBCR: 40.0000 meq | EXTENDED_RELEASE_TABLET | Freq: Once | ORAL | Status: DC

## 2013-10-06 MED ORDER — SODIUM CHLORIDE 0.9 % IV SOLN
250.0000 mL | INTRAVENOUS | Status: DC | PRN
  Administered 2013-10-06: 250 mL via INTRAVENOUS

## 2013-10-06 MED ORDER — KCL IN DEXTROSE-NACL 20-5-0.45 MEQ/L-%-% IV SOLN
INTRAVENOUS | Status: DC
  Administered 2013-10-06 – 2013-10-07 (×2): via INTRAVENOUS

## 2013-10-06 NOTE — Progress Notes (Signed)
14 Fr foley inserted. Peri care performed before insertion. Nurse and nurse tech present on insertion. Pt tolerated well

## 2013-10-06 NOTE — Progress Notes (Addendum)
[facility] PROGRESS NOTE  [name] WGN:[id number] DOB: August 04, 1927 DOA: 10/03/2013 PCP: Cassell SmilesFUSCO,LAWRENCE J., MD  Summary:  This patient was admitted to the hospital with shortness of breath. She has known COPD. It was felt that her acute on chronic respiratory failure was due to COPD exacerbation which was likely precipitated by an influenza infection. The patient was started on standard treatment with steroids, antibiotics and nebulizer treatments. She briefly require BiPAP therapy after admission. She's been monitoring the step down unit. Initially she did improve, but subsequently developed significant agitation/delirium. This was felt to be related to medications. Her Ativan has been discontinued, and steroids were also de escalated since they may have been contributing to delirium. This morning, it appears that her mental status is improving. Her respiratory status is still decompensated. She will likely need several more days in the hospital   Assessment/Plan: Acute on chronic respiratory failure. Due to COPD exacerbation and influenza. Patient briefly required BiPAP on admission. She subsequently down to nasal cannula oxygen. Her son reports that she was not wearing oxygen prior to admission. Continue to wean down oxygen as tolerated. 1. Acute delirium. Urinalysis is unremarkable.  Possibly related to medications.  Steroids were decreased yesterday.  She has not received any ativan since yesterday. Today her mental status appears to be improving.  Continue to monitor. 2. COPD exacerbation. Continue steroids, nebulizer treatments and antibiotics. Start to wean steroids. Add flutter valve. 3. Influenza B. Patient has tested positive for influenza. She is on Tamiflu and droplet precautions. 4. Chronic diastolic congestive heart failure. Appears compensated. 5. Chronic kidney disease stage III. Creatinine is at baseline. 6. Hypothyroidism. Continue Synthroid 7. Hypertension.  Stable 8. Chronic pain syndrome. She's on her chronic pain medications. 9. Hypernatremia. Likely related to decreased po intake and concomitant lasix use.  Will discontinue lasix for now. Give gentle hydration. She takes lasix 80mg  po bid, so this will need to be restarted as her volume status stabilizes. 10. Hypokalemia. Replace. 11. Paroxysmal Atrial fibrillation. Patient is on amiodarone which may not be the best choice for her, considering her underlying lung disease.  She will need to follow up with her cardiologist to discuss this further.  She is on eliquis for anticoagulation.   Code Status: DNR Family Communication: discussed with her son, Mr. Sabino Niemannaschal over the phone. Disposition Plan: discharge back to ALF when improved   Consultants:  none  Procedures:  none  Antibiotics:  levaquin 10/03/13  Vancomycin 10/03/13>>10/04/13  Cefepime 10/03/13>>10/04/13  HPI/Subjective: Appears more alert and coherent today. Complaining of nausea.  No vomiting.  Has not had a bowel movement today.  Objective: Filed Vitals:   10/06/13 0800  BP: 138/72  Pulse: 60  Temp: 99.3 F (37.4 C)  Resp: 20    Intake/Output Summary (Last 24 hours) at 10/06/13 1022 Last data filed at 10/06/13 0500  Gross per 24 hour  Intake    100 ml  Output   1500 ml  Net  -1400 ml   Filed Weights   10/04/13 0500 10/05/13 0500 10/06/13 0500  Weight: 67.4 kg (148 lb 9.4 oz) 68.2 kg (150 lb 5.7 oz) 68.5 kg (151 lb 0.2 oz)    Exam:   General:  Confused, agitated, does not answer questions  Cardiovascular: s1, s2, rrr  Respiratory: mostly clear bilaterally, with mild exp wheeze  Abdomen: soft, nt, nd, bs+  Musculoskeletal:  No edema b/l   Data Reviewed: Basic Metabolic Panel:  Recent Labs Lab 10/03/13 0530 10/04/13 0502 10/05/13  0446 10/06/13 0452  NA 140 137 143 148*  K 4.0 4.0 4.1 3.1*  CL 101 99 103 105  CO2 26 25 26  33*  GLUCOSE 100* 174* 144* 104*  BUN 24* 25* 33* 30*  CREATININE  1.96* 1.60* 1.64* 1.32*  CALCIUM 9.5 9.0 9.7 9.6   Liver Function Tests:  Recent Labs Lab 10/04/13 0502  AST 26  ALT 18  ALKPHOS 61  BILITOT 0.3  PROT 6.2  ALBUMIN 2.9*   No results found for this basename: LIPASE, AMYLASE,  in the last 168 hours No results found for this basename: AMMONIA,  in the last 168 hours CBC:  Recent Labs Lab 10/03/13 0530 10/04/13 0502 10/05/13 0446 10/06/13 0452  WBC 4.2 4.9 8.1 9.8  NEUTROABS 2.2  --   --   --   HGB 11.4* 10.2* 11.0* 11.3*  HCT 36.8 33.3* 35.9* 36.7  MCV 76.7* 76.9* 76.7* 76.1*  PLT 158 150 173 166   Cardiac Enzymes:  Recent Labs Lab 10/03/13 0607  TROPONINI <0.30   BNP (last 3 results)  Recent Labs  08/03/13 1042 09/01/13 1455 10/03/13 0530  PROBNP 1912.0* 1793.0* 1700.0*   CBG:  Recent Labs Lab 10/05/13 0807 10/05/13 1202 10/05/13 1608 10/05/13 2152 10/06/13 0739  GLUCAP 137* 175* 160* 130* 92    Recent Results (from the past 240 hour(s))  URINE CULTURE     Status: None   Collection Time    10/03/13  6:37 AM      Result Value Range Status   Specimen Description URINE, CATHETERIZED   Final   Special Requests NONE   Final   Culture  Setup Time     Final   Value: 10/03/2013 21:30     Performed at [facility] Count     Final   Value: NO GROWTH     Performed at [facility]   Culture     Final   Value: NO GROWTH     Performed at [facility]   Report Status 10/04/2013 FINAL   Final  MRSA PCR SCREENING     Status: None   Collection Time    10/03/13  7:58 AM      Result Value Range Status   MRSA by PCR NEGATIVE  NEGATIVE Final   Comment:            The GeneXpert MRSA Assay (FDA     approved for NASAL specimens     only), is one component of a     comprehensive MRSA colonization     surveillance program. It is not     intended to diagnose MRSA     infection nor to guide or     monitor treatment for     MRSA infections.     Studies: Dg Chest Port 1  View  10/05/2013   CLINICAL DATA:  Worsening confusion, rule out developing pneumonia  EXAM: PORTABLE CHEST - 1 VIEW  COMPARISON:  DG CHEST 1V PORT dated 10/03/2013  FINDINGS: Left-sided pacemaker with continuous leads overlies normal cardiac silhouette. There are low lung volumes. Mild interstitial edema pattern. No focal infiltrate.  IMPRESSION: Low lung volumes and mild interstitial edema.   Electronically Signed   By: Genevive Bi M.D.   On: 10/05/2013 14:26    Scheduled Meds: . amiodarone  200 mg Oral Daily  . antiseptic oral rinse  15 mL Mouth Rinse q12n4p  . apixaban  2.5 mg Oral BID  .  benzonatate  100 mg Oral TID  . brimonidine  1 drop Both Eyes BID AC  . chlorhexidine  15 mL Mouth Rinse BID  . docusate sodium  100 mg Oral BID  . dorzolamide  1 drop Both Eyes BID  . DULoxetine  30 mg Oral Daily  . famotidine  20 mg Oral Daily  . furosemide  80 mg Oral BID  . gabapentin  300 mg Oral TID  . guaiFENesin  600 mg Oral BID  . insulin aspart  0-15 Units Subcutaneous TID WC  . insulin aspart  0-5 Units Subcutaneous QHS  . insulin glargine  10 Units Subcutaneous QHS  . ipratropium  0.5 mg Nebulization Q4H  . latanoprost  1 drop Both Eyes QHS  . levalbuterol  0.63 mg Nebulization Q4H  . levofloxacin (LEVAQUIN) IV  500 mg Intravenous Q48H  . levothyroxine  100 mcg Oral QAC breakfast  . methylPREDNISolone (SOLU-MEDROL) injection  60 mg Intravenous Q12H  . oseltamivir  30 mg Oral Daily  . polyethylene glycol  17 g Oral Daily  . potassium chloride SA  40 mEq Oral Daily  . simvastatin  10 mg Oral q1800  . sodium chloride  3 mL Intravenous Q12H  . trimethoprim  100 mg Oral QHS   Continuous Infusions:   Principal Problem:   COPD exacerbation Active Problems:   Chronic pain syndrome   PAF (paroxysmal atrial fibrillation)   HTN (hypertension)   Hypothyroidism   Spinal stenosis of lumbar region   Long term (current) use of anticoagulants   Chronic diastolic heart failure    Pacemaker - dual chamber Medtronic 2006, gen change 2012   Acute respiratory distress   Stage III chronic kidney disease   Anemia   Influenza B   Acute on chronic respiratory failure   Acute delirium    Time spent:    [facility]  [facility] Pager [phone]. If 7PM-7AM, please contact night-coverage at [email or web address], password [facility] 10/06/2013, 10:22 AM  LOS: 3 days

## 2013-10-06 NOTE — Evaluation (Signed)
Physical Therapy Evaluation Patient Details Name: [name] MRN: [id number] DOB: 1927-04-27 Today's Date: 10/06/2013 Time: [id number] PT Time Calculation (min): 21 min  PT Assessment / Plan / Recommendation History of Present Illness  [name] is a 80 y.o. female with a history of COPD, chronic atrial fibrillation on chronic Coumadin therapy, chronic kidney disease, and chronic pain syndrome, who [facility] with a chief complaint of shortness of breath. The patient has had shortness of breath for 3 or 4 days. She has had a productive cough with yellow sputum.  Pt is + fo flu  Clinical Impression  Pt states that [facility] she gets around with a wheelchair but is able to transfer I.  Pt transfers with min assist at this time    PT Assessment  Patient needs continued PT services    Follow Up Recommendations   none    Does the patient have the potential to tolerate intense rehabilitation    no  Barriers to Discharge  none      Equipment Recommendations   none    Recommendations for Other Services   none  Frequency Min 2X/week    Precautions / Restrictions Precautions Precautions: None Restrictions Weight Bearing Restrictions: No   Pertinent Vitals/Pain None noted      Mobility  Bed Mobility Overal bed mobility: Needs Assistance Bed Mobility: Supine to Sit Supine to sit: Min assist Transfers Overall transfer level: Needs assistance Equipment used: None Transfers: Stand Pivot Transfers Stand pivot transfers: Min assist    Exercises General Exercises - Lower Extremity Ankle Circles/Pumps: 5 reps Hip ABduction/ADduction: 5 reps   PT Diagnosis: Generalized weakness  PT Problem List: Decreased activity tolerance PT Treatment Interventions: Gait training;Functional mobility training     PT Goals(Current goals can be found in the care plan section) Acute Rehab PT Goals PT Goal Formulation: With patient Time  For Goal Achievement: 10/09/13 Potential to Achieve Goals: Fair  Visit Information  Last PT Received On: 10/06/13 History of Present Illness: [name] is a 80 y.o. female with a history of COPD, chronic atrial fibrillation on chronic Coumadin therapy, chronic kidney disease, and chronic pain syndrome, who [facility] with a chief complaint of shortness of breath. The patient has had shortness of breath for 3 or 4 days. She has had a productive cough with yellow sputum.  Pt is + fo flu       Prior Functioning  Home Living Family/patient expects to be discharged to:: Assisted living Additional Comments: pt is primarily w/c dependent...only ambulates with PT Prior Function Level of Independence: Needs assistance Communication Communication: No difficulties    Cognition  Cognition Arousal/Alertness: Awake/alert Overall Cognitive Status: Within Functional Limits for tasks assessed    Extremity/Trunk Assessment Lower Extremity Assessment Lower Extremity Assessment: Generalized weakness   Balance    End of Session PT - End of Session Equipment Utilized During Treatment: Gait belt;Oxygen Activity Tolerance: Patient limited by fatigue Patient left: in chair;with call bell/phone within reach  GP     [name],CINDY 10/06/2013, 9:30 AM

## 2013-10-07 DIAGNOSIS — G9340 Encephalopathy, unspecified: Secondary | ICD-10-CM

## 2013-10-07 LAB — GLUCOSE, CAPILLARY
GLUCOSE-CAPILLARY: 193 mg/dL — AB (ref 70–99)
Glucose-Capillary: 182 mg/dL — ABNORMAL HIGH (ref 70–99)
Glucose-Capillary: 172 mg/dL — ABNORMAL HIGH (ref 70–99)
Glucose-Capillary: 177 mg/dL — ABNORMAL HIGH (ref 70–99)

## 2013-10-07 LAB — CBC
HCT: 39.6 % (ref 36.0–46.0)
Hemoglobin: 12.1 g/dL (ref 12.0–15.0)
MCH: 23.4 pg — AB (ref 26.0–34.0)
MCHC: 30.6 g/dL (ref 30.0–36.0)
MCV: 76.7 fL — ABNORMAL LOW (ref 78.0–100.0)
PLATELETS: 162 10*3/uL (ref 150–400)
RBC: 5.16 MIL/uL — ABNORMAL HIGH (ref 3.87–5.11)
RDW: 20.4 % — AB (ref 11.5–15.5)
WBC: 8.9 10*3/uL (ref 4.0–10.5)

## 2013-10-07 LAB — BASIC METABOLIC PANEL
BUN: 26 mg/dL — AB (ref 6–23)
CALCIUM: 9.8 mg/dL (ref 8.4–10.5)
CO2: 32 mEq/L (ref 19–32)
CREATININE: 1.06 mg/dL (ref 0.50–1.10)
Chloride: 107 mEq/L (ref 96–112)
GFR, EST AFRICAN AMERICAN: 54 mL/min — AB (ref >90)
GFR, EST NON AFRICAN AMERICAN: 47 mL/min — AB (ref >90)
Glucose, Bld: 204 mg/dL — ABNORMAL HIGH (ref 70–99)
Potassium: 3.5 mEq/L — ABNORMAL LOW (ref 3.7–5.3)
Sodium: 148 mEq/L — ABNORMAL HIGH (ref 137–147)

## 2013-10-07 MED ORDER — POTASSIUM CHLORIDE CRYS ER 20 MEQ PO TBCR
40.0000 meq | EXTENDED_RELEASE_TABLET | Freq: Once | ORAL | Status: AC
  Administered 2013-10-07: 40 meq via ORAL
  Filled 2013-10-07: qty 2

## 2013-10-07 NOTE — Progress Notes (Signed)
[facility] PROGRESS NOTE  [name] WUJ:[id number] DOB: 1926/11/30 DOA: 10/03/2013 PCP: Cassell SmilesFUSCO,LAWRENCE J., MD  Summary: 80 year old woman with history of COPD presented with acute respiratory distress, briefly required BiPAP in the emergency department, admitted for COPD exacerbation, treated with steroids antibiotics and nebulizers. Initially improved but then developed agitation and delirium thought related to medications. Ativan was discontinued and steroids were tapered. Overall she has been improving.  Assessment/Plan: 1. Acute on chronic hypoxic respiratory failure. Briefly required BiPAP on admission. Slowly improving. Hypoxia stable on 3 L. 2. COPD exacerbation. Slowly improving. 3. Acute encephalopathy/delirium. Thought possibly related to steroids and medications. Appears to be improving. Calm and cooperative. 4. Influenza B. Tamiflu. 5. Chronic diastolic congestive heart failure. Appears compensated. Weight is stable. 6. Chronic kidney disease stage III. Improved over baseline. 7. Chronic pain syndrome. Thought secondary to spinal stenosis. On chronic fentanyl. Wheelchair-bound. 8. Hypernatremia. Thought secondary to decreased oral intake and Lasix use. Lasix discontinued this admission. 9. Paroxysmal atrial fibrillation. Currently on amiodarone. Followup as an outpatient given her lung disease. Continue eliquis for anticoagulation. 10. Dementia?   Continue oxygen, nebulizers, steroids. Wean oxygen as tolerated.  Finish course of Levaquin and Tamiflu.  Continue daily weights, likely restart Lasix 2/5.  BMP in a.m.  Remove foley  Replace potassium  Can likely transfer to medical floor later today  Pending studies:     Code Status: DNR DVT prophylaxis: Eliquis Family Communication:  Disposition Plan: return to ALF when improved  Brendia Sacksaniel Manoah Deckard, MD  [facility]  Pager [phone] If 7PM-7AM, please contact night-coverage at [email or web address],  password [facility] 10/07/2013, 10:12 AM  LOS: 4 days   Consultants:  PT: no needs  Procedures:    Antibiotics:  Levaquin 1/31 >>   Tamiflu 1/31 >> 2/4  HPI/Subjective: She still feels short of breath.  Objective: Filed Vitals:   10/07/13 0400 10/07/13 0500 10/07/13 0703 10/07/13 0730  BP: 168/81 136/70    Pulse: 84 78    Temp: 97.5 F (36.4 C)   98.8 F (37.1 C)  TempSrc: Axillary   Oral  Resp: 21 15    Height:      Weight:  68 kg (149 lb 14.6 oz)    SpO2: 90% 93% 93%     Intake/Output Summary (Last 24 hours) at 10/07/13 1012 Last data filed at 10/07/13 0500  Gross per 24 hour  Intake 899.17 ml  Output    300 ml  Net 599.17 ml     Filed Weights   10/05/13 0500 10/06/13 0500 10/07/13 0500  Weight: 68.2 kg (150 lb 5.7 oz) 68.5 kg (151 lb 0.2 oz) 68 kg (149 lb 14.6 oz)    Exam:   Afebrile. Vitals appear stable. Hypoxia stable on 3 L.  General: Appears calm, comfortable, acutely ill but nontoxic.  Psychiatric she is alert, oriented to self, hospital location, month, president. She thought it was 2014.  Neurologic: She follows commands, moves all extremities. She has anisocoria which has been documented in the past. (Has a history of glaucoma both eyes).  Cardiovascular: Regular rate and rhythm. No murmur, or gallop. No lower extremity edema  Telemetry paced rhythm  Respiratory clear to auscultation bilaterally. No wheezes, rales or rhonchi. Normal respiratory effort.  Abdomen soft, nontender, nondistended  Skin appears grossly unremarkable.  Data Reviewed:  Weight stable. I/O appear balanced.  Capillary blood sugars stable.  Sodium without change from 148  Potassium improved, 3.5  BUN trending downwards, 26. Creatinine appears better than baseline, 1.06.  CBC unremarkable.  Scheduled Meds: . amiodarone  200 mg Oral Daily  . antiseptic oral rinse  15 mL Mouth Rinse q12n4p  . apixaban  2.5 mg Oral BID  . benzonatate  100 mg Oral TID  .  brimonidine  1 drop Both Eyes BID AC  . chlorhexidine  15 mL Mouth Rinse BID  . docusate sodium  100 mg Oral BID  . dorzolamide  1 drop Both Eyes BID  . DULoxetine  30 mg Oral Daily  . famotidine  20 mg Oral Daily  . gabapentin  300 mg Oral TID  . guaiFENesin  600 mg Oral BID  . insulin aspart  0-15 Units Subcutaneous TID WC  . insulin aspart  0-5 Units Subcutaneous QHS  . insulin glargine  10 Units Subcutaneous QHS  . ipratropium  0.5 mg Nebulization Q4H  . latanoprost  1 drop Both Eyes QHS  . levalbuterol  0.63 mg Nebulization Q4H  . levofloxacin (LEVAQUIN) IV  500 mg Intravenous Q48H  . levothyroxine  100 mcg Oral QAC breakfast  . methylPREDNISolone (SOLU-MEDROL) injection  60 mg Intravenous Q12H  . oseltamivir  30 mg Oral Daily  . polyethylene glycol  17 g Oral Daily  . potassium chloride SA  40 mEq Oral Daily  . simvastatin  10 mg Oral q1800  . sodium chloride  3 mL Intravenous Q12H  . trimethoprim  100 mg Oral QHS   Continuous Infusions: . dextrose 5 % and 0.45 % NaCl with KCl 20 mEq/L 50 mL/hr at 10/06/13 2000    Principal Problem:   COPD exacerbation Active Problems:   Chronic pain syndrome   PAF (paroxysmal atrial fibrillation)   HTN (hypertension)   Hypothyroidism   Spinal stenosis of lumbar region   Long term (current) use of anticoagulants   Chronic diastolic heart failure   Pacemaker - dual chamber Medtronic 2006, gen change 2012   Acute respiratory distress   Stage III chronic kidney disease   Anemia   Influenza B   Acute on chronic respiratory failure   Acute delirium   Acute encephalopathy   Time spent 25 minutes

## 2013-10-08 LAB — GLUCOSE, CAPILLARY
GLUCOSE-CAPILLARY: 147 mg/dL — AB (ref 70–99)
Glucose-Capillary: 161 mg/dL — ABNORMAL HIGH (ref 70–99)
Glucose-Capillary: 185 mg/dL — ABNORMAL HIGH (ref 70–99)
Glucose-Capillary: 139 mg/dL — ABNORMAL HIGH (ref 70–99)

## 2013-10-08 LAB — BASIC METABOLIC PANEL
BUN: 27 mg/dL — ABNORMAL HIGH (ref 6–23)
CALCIUM: 10.8 mg/dL — AB (ref 8.4–10.5)
CO2: 29 mEq/L (ref 19–32)
Chloride: 109 mEq/L (ref 96–112)
Creatinine, Ser: 1.04 mg/dL (ref 0.50–1.10)
GFR, EST AFRICAN AMERICAN: 56 mL/min — AB (ref >90)
GFR, EST NON AFRICAN AMERICAN: 48 mL/min — AB (ref >90)
GLUCOSE: 185 mg/dL — AB (ref 70–99)
Potassium: 4.8 mEq/L (ref 3.7–5.3)
SODIUM: 148 meq/L — AB (ref 137–147)

## 2013-10-08 MED ORDER — ENSURE COMPLETE PO LIQD
237.0000 mL | Freq: Two times a day (BID) | ORAL | Status: DC
  Administered 2013-10-08 – 2013-10-10 (×2): 237 mL via ORAL

## 2013-10-08 NOTE — Progress Notes (Signed)
Patient not using BIPAP at this time. 

## 2013-10-08 NOTE — Progress Notes (Signed)
[facility] PROGRESS NOTE  Debbe Balesvelyn J Boulden ZOX:[id number] DOB: 1927-04-30 DOA: 10/03/2013 PCP: Cassell SmilesFUSCO,LAWRENCE J., MD  Summary: 80 year old woman with history of COPD presented with acute respiratory distress, briefly required BiPAP in the emergency department, admitted for COPD exacerbation, treated with steroids antibiotics and nebulizers. Initially improved but then developed agitation and delirium thought related to medications. Ativan was discontinued and steroids were tapered. Overall she has been improving.  Assessment/Plan: 1. Acute on chronic hypoxic respiratory failure. Briefly required BiPAP on admission. Slowly improving. Hypoxia stable on 3 L. 2. COPD exacerbation. Slow to improve. 3. Acute encephalopathy/delirium. Thought possibly related to steroids and medications. Appears to be mildly confused today. Calm and cooperative. 4. Influenza B. continue Tamiflu. 5. Chronic diastolic congestive heart failure. Appears compensated. Weight is stable. 6. Chronic kidney disease stage III. Improved over baseline. 7. Chronic pain syndrome. Thought secondary to spinal stenosis. On chronic fentanyl. Wheelchair-bound. 8. Hypernatremia. Thought secondary to decreased oral intake and Lasix use. Lasix discontinued this admission. Expect spontaneous improvement with improvement in diet. 9. Paroxysmal atrial fibrillation. Currently on amiodarone. Followup as an outpatient given her lung disease. Continue eliquis for anticoagulation. 10. Dementia?   Continue oxygen, nebulizers, steroids.   Finish course of Levaquin and Tamiflu.  Consider restarting Lasix 2/6  Although mildly confused overall her clinical status appears tenuous but stable, expect very slow improvement in regard to COPD, influenza. Can however transferred to the medical floor.  Pending studies:     Code Status: DNR DVT prophylaxis: Eliquis Family Communication:  Disposition Plan: return to ALF when improved  Brendia Sacksaniel  Airianna Kreischer, MD  [facility]  Pager [phone] If 7PM-7AM, please contact night-coverage at [email or web address], password [facility] 10/08/2013, 12:07 PM  LOS: 5 days   Consultants:  PT: no needs  Procedures:    Antibiotics:  Levaquin 1/31 >> 2/5  Tamiflu 1/31 >> 2/4  HPI/Subjective: Complains of some nausea but is able to eat. Breathing seems to be okay today. Mildly confused per nursing.  Objective: Filed Vitals:   10/08/13 1000 10/08/13 1100 10/08/13 1130 10/08/13 1149  BP: 128/74 148/93    Pulse: 77 54    Temp:   99.7 F (37.6 C)   TempSrc:   Axillary   Resp: 21     Height:      Weight:      SpO2: 88% 85%  95%    Intake/Output Summary (Last 24 hours) at 10/08/13 1207 Last data filed at 10/08/13 1100  Gross per 24 hour  Intake    680 ml  Output      0 ml  Net    680 ml     Filed Weights   10/06/13 0500 10/07/13 0500 10/08/13 0500  Weight: 68.5 kg (151 lb 0.2 oz) 68 kg (149 lb 14.6 oz) 66.7 kg (147 lb 0.8 oz)    Exam:   Afebrile. Vitals appear stable. Hypoxia stable on 3 L.  General: Appears calm and mildly uncomfortable. Nontoxic but appears chronically ill.  Cardiovascular irregular, normal rate. No murmur, rub or gallop. No lower extremity edema.  Respiratory clear to auscultation bilaterally. No wheezes, rales or rhonchi. Normal respiratory effort. Fair air movement.  Abdomen soft nontender and nondistended.  Psychiatric confused but oriented to self. Follows commands well. Speech is generally appropriate.  Data Reviewed:  Weight stable. I/O balanced.  Capillary blood sugars stable  Potassium normal. Creatinine normal. Sodium slightly high.  Scheduled Meds: . amiodarone  200 mg Oral Daily  . antiseptic oral rinse  15 mL  Mouth Rinse q12n4p  . apixaban  2.5 mg Oral BID  . benzonatate  100 mg Oral TID  . brimonidine  1 drop Both Eyes BID AC  . chlorhexidine  15 mL Mouth Rinse BID  . docusate sodium  100 mg Oral BID  . dorzolamide  1 drop Both  Eyes BID  . DULoxetine  30 mg Oral Daily  . famotidine  20 mg Oral Daily  . gabapentin  300 mg Oral TID  . guaiFENesin  600 mg Oral BID  . insulin aspart  0-15 Units Subcutaneous TID WC  . insulin aspart  0-5 Units Subcutaneous QHS  . insulin glargine  10 Units Subcutaneous QHS  . ipratropium  0.5 mg Nebulization Q4H  . latanoprost  1 drop Both Eyes QHS  . levalbuterol  0.63 mg Nebulization Q4H  . levofloxacin (LEVAQUIN) IV  500 mg Intravenous Q48H  . levothyroxine  100 mcg Oral QAC breakfast  . methylPREDNISolone (SOLU-MEDROL) injection  60 mg Intravenous Q12H  . polyethylene glycol  17 g Oral Daily  . potassium chloride SA  40 mEq Oral Daily  . simvastatin  10 mg Oral q1800  . sodium chloride  3 mL Intravenous Q12H  . trimethoprim  100 mg Oral QHS   Continuous Infusions:    Principal Problem:   COPD exacerbation Active Problems:   Chronic pain syndrome   PAF (paroxysmal atrial fibrillation)   HTN (hypertension)   Hypothyroidism   Spinal stenosis of lumbar region   Long term (current) use of anticoagulants   Chronic diastolic heart failure   Pacemaker - dual chamber Medtronic 2006, gen change 2012   Acute respiratory distress   Stage III chronic kidney disease   Anemia   Influenza B   Acute on chronic respiratory failure   Acute delirium   Acute encephalopathy   Time spent 20 minutes

## 2013-10-08 NOTE — Progress Notes (Addendum)
Patient arrived to floor. Drowsy but arousable. No complaints voiced at this time. Vital signs stable.

## 2013-10-08 NOTE — Progress Notes (Signed)
Patient 88% on Room air. O2 reapplied at 1.5 liters by [place]. Patient O2 sat 92% at this time.

## 2013-10-08 NOTE — Progress Notes (Signed)
INITIAL NUTRITION ASSESSMENT  DOCUMENTATION CODES Per approved criteria  -Not Applicable   INTERVENTION: Ensure Complete po BID, each supplement provides 350 kcal and 13 grams of protein  NUTRITION DIAGNOSIS: Inadequate oral intake related to decreased appetite as evidenced by PO: 10-25%.   Goal: Pt will meet >90% of estimated nutritional needs  Monitor:  PO intake, weight changes, labs, skin assessments, I/O's  Reason for Assessment: low braden=12, poor po's, LOS  80 y.o. female  Admitting Dx: COPD exacerbation  ASSESSMENT: Pt admitted for COPD exacerbation. She resides at [facility]. Per MD notes, pt is improving very slowly; was transferred from ICU to medical floor this afternoon. Appetite has been poor this admission; PO: 10-25%. Pt drowsy at time of visit and unable to participate in interview and physical exam. No family present. Noted no signs of fat or muscle depletion on eyes, temples, or clavicle areas. Wt hx reveals a 2.6% wt loss x 1 month and a 2% wt loss x 1 year, neither of which are clinically significant. Pt does not meet criteria for malnutrition, however, is at risk given advanced age and poor PO intake.   Height: Ht Readings from Last 1 Encounters:  10/03/13 5\' 3"  (1.6 m)    Weight: Wt Readings from Last 1 Encounters:  10/08/13 147 lb 0.8 oz (66.7 kg)    Ideal Body Weight: 115#  % Ideal Body Weight: 128%  Wt Readings from Last 10 Encounters:  10/08/13 147 lb 0.8 oz (66.7 kg)  09/13/13 151 lb (68.493 kg)  09/01/13 154 lb (69.854 kg)  08/03/13 151 lb (68.493 kg)  06/11/13 153 lb 3.2 oz (69.491 kg)  02/25/13 152 lb 12.5 oz (69.3 kg)  01/21/13 151 lb 3.2 oz (68.584 kg)  09/11/12 150 lb 12.7 oz (68.4 kg)  08/25/12 142 lb (64.411 kg)  06/28/12 149 lb (67.586 kg)    Usual Body Weight: 150#  % Usual Body Weight: 98%  BMI:  Body mass index is 26.05 kg/(m^2). Meets criteria for overweight.  Estimated Nutritional Needs: Kcal: 1610-9604  daily Protein: 53-67 grams daily Fluid: 1.5-1.7 L daily  Skin: Intact  Diet Order: Dysphagia  EDUCATION NEEDS: -Education not appropriate at this time   Intake/Output Summary (Last 24 hours) at 10/08/13 1446 Last data filed at 10/08/13 1100  Gross per 24 hour  Intake    540 ml  Output      0 ml  Net    540 ml    Last BM: 10/08/13   Labs:   Recent Labs Lab 10/06/13 0452 10/07/13 0520 10/08/13 0451  NA 148* 148* 148*  K 3.1* 3.5* 4.8  CL 105 107 109  CO2 33* 32 29  BUN 30* 26* 27*  CREATININE 1.32* 1.06 1.04  CALCIUM 9.6 9.8 10.8*  GLUCOSE 104* 204* 185*    CBG (last 3)   Recent Labs  10/07/13 2146 10/08/13 0734 10/08/13 1138  GLUCAP 177* 161* 185*    Scheduled Meds: . amiodarone  200 mg Oral Daily  . antiseptic oral rinse  15 mL Mouth Rinse q12n4p  . apixaban  2.5 mg Oral BID  . benzonatate  100 mg Oral TID  . brimonidine  1 drop Both Eyes BID AC  . chlorhexidine  15 mL Mouth Rinse BID  . docusate sodium  100 mg Oral BID  . dorzolamide  1 drop Both Eyes BID  . DULoxetine  30 mg Oral Daily  . famotidine  20 mg Oral Daily  . gabapentin  300 mg  Oral TID  . guaiFENesin  600 mg Oral BID  . insulin aspart  0-15 Units Subcutaneous TID WC  . insulin aspart  0-5 Units Subcutaneous QHS  . insulin glargine  10 Units Subcutaneous QHS  . ipratropium  0.5 mg Nebulization Q4H  . latanoprost  1 drop Both Eyes QHS  . levalbuterol  0.63 mg Nebulization Q4H  . levofloxacin (LEVAQUIN) IV  500 mg Intravenous Q48H  . levothyroxine  100 mcg Oral QAC breakfast  . methylPREDNISolone (SOLU-MEDROL) injection  60 mg Intravenous Q12H  . polyethylene glycol  17 g Oral Daily  . potassium chloride SA  40 mEq Oral Daily  . simvastatin  10 mg Oral q1800  . sodium chloride  3 mL Intravenous Q12H  . trimethoprim  100 mg Oral QHS    Continuous Infusions:   Past Medical History  Diagnosis Date  . Hypertension   . High cholesterol   . Arthritis   . Hypothyroidism   .  Arrhythmia     pacemaker  . Glaucoma   . Pulmonary embolism 06/04/11  . Coronary artery disease   . Atrial fibrillation   . 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 Anxiety   . COPD (chronic obstructive pulmonary disease)   . Compression fracture   . Chronic kidney disease     Stage III. Baseline creatinine 1.6-2.0.  . Chronic pain syndrome 09/06/2008    Qualifier: Diagnosis of  By: Romeo AppleHarrison MD, Duffy RhodyStanley    . Influenza A virus present 10/03/2013    Past Surgical History  Procedure Laterality Date  . Total hip arthroplasty      right side  . Pacemaker insertion    . Appendectomy    . Abdominal hysterectomy      Pearla Mckinny A. Mayford KnifeWilliams, RD, LDN Pager: [phone]

## 2013-10-08 NOTE — Progress Notes (Signed)
Patient oxygen weaned to 1.5 liters from 3 liters. Patient 95%. Oxygen removed, O2 sat 92% at this time. Will continue to monitor.

## 2013-10-09 ENCOUNTER — Inpatient Hospital Stay (HOSPITAL_COMMUNITY): Payer: Medicare Other

## 2013-10-09 LAB — CBC
HEMATOCRIT: 42.5 % (ref 36.0–46.0)
HEMOGLOBIN: 12.9 g/dL (ref 12.0–15.0)
MCH: 23.3 pg — ABNORMAL LOW (ref 26.0–34.0)
MCHC: 30.4 g/dL (ref 30.0–36.0)
MCV: 76.9 fL — AB (ref 78.0–100.0)
Platelets: 210 10*3/uL (ref 150–400)
RBC: 5.53 MIL/uL — AB (ref 3.87–5.11)
RDW: 20.7 % — AB (ref 11.5–15.5)
WBC: 14 10*3/uL — AB (ref 4.0–10.5)

## 2013-10-09 LAB — BASIC METABOLIC PANEL
BUN: 33 mg/dL — ABNORMAL HIGH (ref 6–23)
CHLORIDE: 113 meq/L — AB (ref 96–112)
CO2: 29 meq/L (ref 19–32)
CREATININE: 1.08 mg/dL (ref 0.50–1.10)
Calcium: 11.3 mg/dL — ABNORMAL HIGH (ref 8.4–10.5)
GFR calc non Af Amer: 46 mL/min — ABNORMAL LOW (ref >90)
GFR, EST AFRICAN AMERICAN: 53 mL/min — AB (ref >90)
Glucose, Bld: 156 mg/dL — ABNORMAL HIGH (ref 70–99)
POTASSIUM: 4.4 meq/L (ref 3.7–5.3)
Sodium: 153 mEq/L — ABNORMAL HIGH (ref 137–147)

## 2013-10-09 LAB — GLUCOSE, CAPILLARY
GLUCOSE-CAPILLARY: 150 mg/dL — AB (ref 70–99)
GLUCOSE-CAPILLARY: 170 mg/dL — AB (ref 70–99)
Glucose-Capillary: 135 mg/dL — ABNORMAL HIGH (ref 70–99)

## 2013-10-09 MED ORDER — DEXTROSE 5 % IV SOLN
INTRAVENOUS | Status: AC
  Administered 2013-10-09: 13:00:00 via INTRAVENOUS
  Filled 2013-10-09: qty 1000

## 2013-10-09 MED ORDER — PREDNISONE 20 MG PO TABS
40.0000 mg | ORAL_TABLET | Freq: Every day | ORAL | Status: DC
  Filled 2013-10-09: qty 2

## 2013-10-09 MED ORDER — LEVALBUTEROL HCL 0.63 MG/3ML IN NEBU
0.6300 mg | INHALATION_SOLUTION | RESPIRATORY_TRACT | Status: DC
  Administered 2013-10-10 – 2013-10-12 (×15): 0.63 mg via RESPIRATORY_TRACT
  Filled 2013-10-09 (×15): qty 3

## 2013-10-09 MED ORDER — LEVOFLOXACIN 750 MG PO TABS
750.0000 mg | ORAL_TABLET | ORAL | Status: DC
Start: 2013-10-09 — End: 2013-10-09
  Filled 2013-10-09: qty 1

## 2013-10-09 MED ORDER — LEVOFLOXACIN 500 MG PO TABS: 500.0000 mg | ORAL_TABLET | ORAL | Status: DC

## 2013-10-09 NOTE — Progress Notes (Signed)
ANTICOAGULATION CONSULT NOTE  Pharmacy Consult for Eliquis  Indication: atrial fibrillation  Allergies  Allergen Reactions  . Naproxen     Patient on coumadin  . Sulfa Antibiotics   . Robaxin [Methocarbamol]     Causes patient to be confused   Patient Measurements: Height: 5\' 3"  (160 cm) Weight: 147 lb 0.8 oz (66.7 kg) IBW/kg (Calculated) : 52.4  Vital Signs: Temp: 98.1 F (36.7 C) (02/06 0706) Temp src: Oral (02/06 0706) BP: 180/101 mmHg (02/06 0706) Pulse Rate: 95 (02/06 0706)  Labs:  Recent Labs  10/07/13 0520 10/08/13 0451 10/09/13 0551  HGB 12.1  --  12.9  HCT 39.6  --  42.5  PLT 162  --  210  CREATININE 1.06 1.04 1.08   Estimated Creatinine Clearance: 35.6 ml/min (by C-G formula based on Cr of 1.08).  Medical History: Past Medical History  Diagnosis Date  . Hypertension   . High cholesterol   . Arthritis   . Hypothyroidism   . Arrhythmia     pacemaker  . Glaucoma   . Pulmonary embolism 06/04/11  . Coronary artery disease   . Atrial fibrillation   . 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 Anxiety   . COPD (chronic obstructive pulmonary disease)   . Compression fracture   . Chronic kidney disease     Stage III. Baseline creatinine 1.6-2.0.  . Chronic pain syndrome 09/06/2008    Qualifier: Diagnosis of  By: Romeo Apple MD, Duffy Rhody    . Influenza A virus present 10/03/2013   Medications:  Prescriptions prior to admission  Medication Sig Dispense Refill  . albuterol (PROVENTIL) (2.5 MG/3ML) 0.083% nebulizer solution Take 2.5 mg by nebulization 3 (three) times daily.      Marland Kitchen amiodarone (PACERONE) 200 MG tablet Take 200 mg by mouth daily.       Marland Kitchen amoxicillin (AMOXIL) 500 MG capsule Take 2,000 mg by mouth as directed.      Marland Kitchen apixaban (ELIQUIS) 2.5 MG TABS tablet Take 2.5 mg by mouth 2 (two) times daily.      . bimatoprost (LUMIGAN) 0.01 % SOLN Place 1 drop into both eyes at bedtime.       . brimonidine (ALPHAGAN P) 0.1 % SOLN Place 1 drop into both eyes  2 (two) times daily.      . fentaNYL (DURAGESIC - DOSED MCG/HR) 12 MCG/HR Place 12.5 mcg onto the skin every 3 (three) days. Rotating site      . GuaiFENesin (TUSSIN EXPECTORANT PO) Take 10 mLs by mouth 4 (four) times daily as needed. Cough/congestion      . HYDROcodone-acetaminophen (NORCO/VICODIN) 5-325 MG per tablet Take 1 tablet by mouth 3 (three) times daily as needed for moderate pain. pain      . levofloxacin (LEVAQUIN) 500 MG tablet Take 500 mg by mouth every other day. For 10 days (started on 10/01/13)      . LORazepam (ATIVAN) 0.5 MG tablet Take 0.25 mg by mouth 3 (three) times daily as needed for anxiety. anxiety      . sertraline (ZOLOFT) 25 MG tablet Take 25 mg by mouth daily.      Marland Kitchen acetaminophen (TYLENOL) 325 MG tablet Take 650 mg by mouth every 6 (six) hours as needed for mild pain or fever.       . Alum & Mag Hydroxide-Simeth (MAGIC MOUTHWASH) SOLN Take 10 mLs by mouth 4 (four) times daily as needed.      . Calcium Carbonate-Vitamin D (CALCIUM 600 + D PO)  Take 1 tablet by mouth 2 (two) times daily.      Marland Kitchen. docusate sodium (COLACE) 100 MG capsule Take 100 mg by mouth 2 (two) times daily.      . dorzolamide (TRUSOPT) 2 % ophthalmic solution Place 1 drop into both eyes 2 (two) times daily.      . furosemide (LASIX) 80 MG tablet Take 1 tablet (80 mg total) by mouth 2 (two) times daily.  60 tablet  0  . gabapentin (NEURONTIN) 300 MG capsule Take 1 capsule (300 mg total) by mouth 3 (three) times daily.  30 capsule  0  . levothyroxine (SYNTHROID, LEVOTHROID) 100 MCG tablet Take 100 mcg by mouth daily.      . metoprolol (LOPRESSOR) 50 MG tablet Take 50 mg by mouth every morning.      . ondansetron (ZOFRAN) 4 MG tablet Take 4 mg by mouth every 6 (six) hours as needed. For nausea      . polyethylene glycol powder (GLYCOLAX/MIRALAX) powder Take 17 g by mouth daily.       . potassium chloride SA (K-DUR,KLOR-CON) 20 MEQ tablet Take 2 tablets (40 mEq total) by mouth daily.      Marland Kitchen. tiotropium  (SPIRIVA) 18 MCG inhalation capsule Place 18 mcg into inhaler and inhale daily.      Marland Kitchen. trimethoprim (TRIMPEX) 100 MG tablet Take 100 mg by mouth at bedtime.       Assessment: 80yo female on Eliquis 2.5mg  po bid PTA for h/o afib.  Patient has continued on Eliquis during admission.  CBC stable, no bleeding noted. Renal function as patient's baseline.   Plan:  Eliquis 2.5mg  PO BID No further dose adjustments anticipated- pharmacy to sign off  Shantea Poulton, Mercy RidingAndrea Michelle 10/09/2013,7:57 AM

## 2013-10-09 NOTE — Progress Notes (Addendum)
ANTIBIOTIC CONSULT NOTE  Pharmacy Consult for Levaquin Indication: COPD exacerbation  Allergies  Allergen Reactions  . Naproxen     Patient on coumadin  . Sulfa Antibiotics   . Robaxin [Methocarbamol]     Causes patient to be confused   Patient Measurements: Height: 5\' 3"  (160 cm) Weight: 147 lb 0.8 oz (66.7 kg) IBW/kg (Calculated) : 52.4  Vital Signs: Temp: 98.1 F (36.7 C) (02/06 0706) Temp src: Oral (02/06 0706) BP: 180/101 mmHg (02/06 0706) Pulse Rate: 95 (02/06 0706) Intake/Output from previous day: 02/05 0701 - 02/06 0700 In: 490 [P.O.:450; I.V.:40] Out: -  Intake/Output from this shift:    Labs:  Recent Labs  10/07/13 0520 10/08/13 0451 10/09/13 0551  WBC 8.9  --  14.0*  HGB 12.1  --  12.9  PLT 162  --  210  CREATININE 1.06 1.04 1.08   Estimated Creatinine Clearance: 35.6 ml/min (by C-G formula based on Cr of 1.08). No results found for this basename: Rolm Gala, VANCORANDOM, GENTTROUGH, GENTPEAK, GENTRANDOM, TOBRATROUGH, TOBRAPEAK, TOBRARND, AMIKACINPEAK, AMIKACINTROU, AMIKACIN,  in the last 72 hours   Microbiology: Recent Results (from the past 720 hour(s))  URINE CULTURE     Status: None   Collection Time    10/03/13  6:37 AM      Result Value Range Status   Specimen Description URINE, CATHETERIZED   Final   Special Requests NONE   Final   Culture  Setup Time     Final   Value: 10/03/2013 21:30     Performed at [facility] Count     Final   Value: NO GROWTH     Performed at [facility]   Culture     Final   Value: NO GROWTH     Performed at [facility]   Report Status 10/04/2013 FINAL   Final  MRSA PCR SCREENING     Status: None   Collection Time    10/03/13  7:58 AM      Result Value Range Status   MRSA by PCR NEGATIVE  NEGATIVE Final   Comment:            The GeneXpert MRSA Assay (FDA     approved for NASAL specimens     only), is one component of a     comprehensive MRSA colonization      surveillance program. It is not     intended to diagnose MRSA     infection nor to guide or     monitor treatment for     MRSA infections.   Medical History: Past Medical History  Diagnosis Date  . Hypertension   . High cholesterol   . Arthritis   . Hypothyroidism   . Arrhythmia     pacemaker  . Glaucoma   . Pulmonary embolism 06/04/11  . Coronary artery disease   . Atrial fibrillation   . 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 Anxiety   . COPD (chronic obstructive pulmonary disease)   . Compression fracture   . Chronic kidney disease     Stage III. Baseline creatinine 1.6-2.0.  . Chronic pain syndrome 09/06/2008    Qualifier: Diagnosis of  By: Romeo Apple MD, Duffy Rhody    . Influenza A virus present 10/03/2013   Medications:  Scheduled:  . amiodarone  200 mg Oral Daily  . antiseptic oral rinse  15 mL Mouth Rinse q12n4p  . apixaban  2.5 mg Oral BID  .  benzonatate  100 mg Oral TID  . brimonidine  1 drop Both Eyes BID AC  . chlorhexidine  15 mL Mouth Rinse BID  . docusate sodium  100 mg Oral BID  . dorzolamide  1 drop Both Eyes BID  . DULoxetine  30 mg Oral Daily  . famotidine  20 mg Oral Daily  . feeding supplement (ENSURE COMPLETE)  237 mL Oral BID BM  . gabapentin  300 mg Oral TID  . guaiFENesin  600 mg Oral BID  . insulin aspart  0-15 Units Subcutaneous TID WC  . insulin aspart  0-5 Units Subcutaneous QHS  . insulin glargine  10 Units Subcutaneous QHS  . ipratropium  0.5 mg Nebulization Q4H  . latanoprost  1 drop Both Eyes QHS  . levalbuterol  0.63 mg Nebulization Q4H  . levofloxacin (LEVAQUIN) IV  500 mg Intravenous Q48H  . levothyroxine  100 mcg Oral QAC breakfast  . methylPREDNISolone (SOLU-MEDROL) injection  60 mg Intravenous Q12H  . polyethylene glycol  17 g Oral Daily  . potassium chloride SA  40 mEq Oral Daily  . simvastatin  10 mg Oral q1800  . sodium chloride  3 mL Intravenous Q12H  . trimethoprim  100 mg Oral QHS   Assessment: 80yo female  admitted with COPD exacerbation and influenza.  Antibiotics were narrowed to Levaquin when PNA ruled out.  Patient remains afebrile & slowly improving per MD notes.   Renal function has improved to patient's baseline.   ANTIBIOTICS: Cefepime 1/31 >>2/1 Levaquin 1/29 >> (received 2 days oral Levaquin PTA) Vancomycin 1/31 >>2/1 Tamiflu 1/31>>2/4 Trimethoprim from PTA med list  Goal of Therapy:  Eradicate infection..  Plan:  Levaquin 750mg  po q48hrs Duration of therapy per MD- 7 days recommended per COPD exacerbation guidelines Pharmacy to sign off.  Please re-consult as needed.   Elson ClanLilliston, Breaunna Gottlieb Michelle 10/09/2013,7:48 AM

## 2013-10-09 NOTE — Clinical Social Work Note (Signed)
CSW updated [id number] Currently requiring oxygen. Facility uses [facility] if needed at d/c. Possible d/c this weekend per MD. Facility agreeable.   Derenda FennelKara Kazi [place], [place] [phone]

## 2013-10-09 NOTE — Progress Notes (Addendum)
[facility] PROGRESS NOTE  Debbe Balesvelyn J Wrightson WUJ:[id number] DOB: [birth year] DOA: 10/03/2013 PCP: Cassell SmilesFUSCO,LAWRENCE J., MD  Summary: 80 year old woman with history of COPD presented with acute respiratory distress, briefly required BiPAP in the emergency department, admitted for COPD exacerbation, treated with steroids antibiotics and nebulizers. Initially improved but then developed agitation and delirium thought related to medications. Ativan was discontinued and steroids were tapered. Overall has plateaued although respiratory status appears to be stable. Clinical prognosis is guarded.  Assessment/Plan: 1. Acute on chronic hypoxic respiratory failure. Briefly required BiPAP on admission. No significant change. Hypoxia stable on 3 L. 2. COPD exacerbation. No significant change, seems to have plateaued. 3. Acute encephalopathy/delirium. Thought possibly related to steroids and medications. Appears to be mildly confused today without change compared to yesterday. Calm and cooperative. 4. Nausea. Abdominal x-ray 2/3 was unremarkable. Not clear when last bowel movement was. Start bowel regimen check abdominal x-ray. Nontender on examination. 5. Influenza B. completed Tamiflu. 6. Chronic diastolic congestive heart failure. Appears compensated. Weight is stable. 7. Chronic kidney disease stage III. Improved over baseline. 8. Chronic pain syndrome. Thought secondary to spinal stenosis. On chronic fentanyl. Wheelchair-bound. 9. Hypernatremia. Somewhat worse. Likely secondary to poor nutrition. 10. Paroxysmal atrial fibrillation. Continue amiodarone. Followup as an outpatient given her lung disease. Continue eliquis for anticoagulation. 11. Dementia?   Continue supplemental oxygen. Change to oral prednisone tomorrow. Continue nebulizers.  IVF, check BMP in AM  Bowel regimen  Check AXR  No improvement overall, she remains somewhat confused, SOB and is nauseous. Oral intake is poor and clinically  overall she appears worse. Her prognosis is guarded and I fear she may not recover from her COPD exacerbation and influenza. I discussed her case frankly with her husband as well as her son/power of attorney Lee--they have been visiting her and have noted chief is failing to improve. They understand guarded prognosis and that she may not survive this hospitalization.  Pending studies:     Code Status: DNR DVT prophylaxis: Eliquis Family Communication:  Disposition Plan: return to ALF when improved  Brendia Sacksaniel Goodrich, MD  [facility]  Pager [phone] If 7PM-7AM, please contact night-coverage at [email or web address], password [email or web address] 10/09/2013, 12:00 PM  LOS: 6 days   Consultants:  PT: no needs  Procedures:    Antibiotics:  Levaquin 1/31 >> 2/5  Tamiflu 1/31 >> 2/4  HPI/Subjective: Complains of nausea. Refused medications this morning. No abdominal pain. Breathing okay. No other complaints.  Objective: Filed Vitals:   10/09/13 0413 10/09/13 0706 10/09/13 0735 10/09/13 1135  BP:  180/101    Pulse:  95    Temp:  98.1 F (36.7 C)    TempSrc:  Oral    Resp:  20    Height:      Weight:      SpO2: 92% 94% 93% 94%    Intake/Output Summary (Last 24 hours) at 10/09/13 1200 Last data filed at 10/09/13 0502  Gross per 24 hour  Intake    240 ml  Output      0 ml  Net    240 ml     Filed Weights   10/06/13 0500 10/07/13 0500 10/08/13 0500  Weight: 68.5 kg (151 lb 0.2 oz) 68 kg (149 lb 14.6 oz) 66.7 kg (147 lb 0.8 oz)    Exam:   Afebrile. Labile hypertension. Oxygen saturation stable on 3 L.  General: Appears calm, mildly uncomfortable. Appears chronically ill, acutely ill but not toxic.  Cardiovascular regular rate and rhythm. No murmur,  rub or gallop. No lower extremity edema.  Respiratory generally clear without frank wheezes, rales or rhonchi. Mild increased respiratory effort.  Abdomen soft nontender and nondistended. No guarding noted. Positive bowel  sounds.  Skin appears grossly unremarkable.  Psychiatric appears to be confused but does follow simple commands, moves all extremities to command and answers simple questions.  Data Reviewed:  Capillary blood sugars well-controlled.  Hypernatremia. Sodium 153. Chloride 113. BUN slightly increased, 33. No significant change overall though. Creatinine normal. Elevated calcium likely reflects dehydration, recently normal 2 days ago.  Blood cell count 14, currently on steroids. CBC otherwise unremarkable.  Scheduled Meds: . amiodarone  200 mg Oral Daily  . antiseptic oral rinse  15 mL Mouth Rinse q12n4p  . apixaban  2.5 mg Oral BID  . benzonatate  100 mg Oral TID  . brimonidine  1 drop Both Eyes BID AC  . chlorhexidine  15 mL Mouth Rinse BID  . docusate sodium  100 mg Oral BID  . dorzolamide  1 drop Both Eyes BID  . DULoxetine  30 mg Oral Daily  . famotidine  20 mg Oral Daily  . feeding supplement (ENSURE COMPLETE)  237 mL Oral BID BM  . gabapentin  300 mg Oral TID  . guaiFENesin  600 mg Oral BID  . insulin aspart  0-15 Units Subcutaneous TID WC  . insulin aspart  0-5 Units Subcutaneous QHS  . insulin glargine  10 Units Subcutaneous QHS  . ipratropium  0.5 mg Nebulization Q4H  . latanoprost  1 drop Both Eyes QHS  . levalbuterol  0.63 mg Nebulization Q4H  . levofloxacin  750 mg Oral Q48H  . levothyroxine  100 mcg Oral QAC breakfast  . methylPREDNISolone (SOLU-MEDROL) injection  60 mg Intravenous Q12H  . polyethylene glycol  17 g Oral Daily  . potassium chloride SA  40 mEq Oral Daily  . simvastatin  10 mg Oral q1800  . sodium chloride  3 mL Intravenous Q12H  . trimethoprim  100 mg Oral QHS   Continuous Infusions: . dextrose 75 mL/hr at 10/09/13 1058    Principal Problem:   COPD exacerbation Active Problems:   Chronic pain syndrome   PAF (paroxysmal atrial fibrillation)   HTN (hypertension)   Hypothyroidism   Spinal stenosis of lumbar region   Long term (current) use  of anticoagulants   Chronic diastolic heart failure   Pacemaker - dual chamber Medtronic 2006, gen change 2012   Acute respiratory distress   Stage III chronic kidney disease   Anemia   Influenza B   Acute on chronic respiratory failure   Acute delirium   Acute encephalopathy   Time spent 25 minutes

## 2013-10-10 ENCOUNTER — Inpatient Hospital Stay (HOSPITAL_COMMUNITY): Payer: Medicare Other

## 2013-10-10 LAB — GLUCOSE, CAPILLARY
GLUCOSE-CAPILLARY: 164 mg/dL — AB (ref 70–99)
GLUCOSE-CAPILLARY: 123 mg/dL — AB (ref 70–99)
Glucose-Capillary: 118 mg/dL — ABNORMAL HIGH (ref 70–99)
Glucose-Capillary: 118 mg/dL — ABNORMAL HIGH (ref 70–99)
Glucose-Capillary: 109 mg/dL — ABNORMAL HIGH (ref 70–99)

## 2013-10-10 LAB — BASIC METABOLIC PANEL
BUN: 30 mg/dL — ABNORMAL HIGH (ref 6–23)
CO2: 27 mEq/L (ref 19–32)
Calcium: 11.4 mg/dL — ABNORMAL HIGH (ref 8.4–10.5)
Chloride: 108 mEq/L (ref 96–112)
Creatinine, Ser: 0.98 mg/dL (ref 0.50–1.10)
GFR, EST AFRICAN AMERICAN: 60 mL/min — AB (ref >90)
GFR, EST NON AFRICAN AMERICAN: 52 mL/min — AB (ref >90)
Glucose, Bld: 124 mg/dL — ABNORMAL HIGH (ref 70–99)
Potassium: 4.1 mEq/L (ref 3.7–5.3)
SODIUM: 147 meq/L (ref 137–147)

## 2013-10-10 LAB — CBC
HCT: 46.7 % — ABNORMAL HIGH (ref 36.0–46.0)
Hemoglobin: 14.4 g/dL (ref 12.0–15.0)
MCH: 23.6 pg — ABNORMAL LOW (ref 26.0–34.0)
MCHC: 30.8 g/dL (ref 30.0–36.0)
MCV: 76.7 fL — ABNORMAL LOW (ref 78.0–100.0)
Platelets: 209 10*3/uL (ref 150–400)
RBC: 6.09 MIL/uL — AB (ref 3.87–5.11)
RDW: 21 % — AB (ref 11.5–15.5)
WBC: 19.7 10*3/uL — AB (ref 4.0–10.5)

## 2013-10-10 MED ORDER — FUROSEMIDE 40 MG PO TABS: 40.0000 mg | ORAL_TABLET | Freq: Two times a day (BID) | ORAL | Status: DC

## 2013-10-10 NOTE — Progress Notes (Addendum)
[facility] PROGRESS NOTE  DONN WILMOT WUJ:[id number] DOB: June 15, 1927 DOA: 10/03/2013 PCP: Cassell Smiles., MD  Addendum: Discussed with son/HCPOA, discussed guarded prognosis, lack of oral intake, dwindling, survival doubted. He fully understands, will come tomorrow to discuss further. Need to consider SNF (from ALF) or could consider hospice  Summary: 80 year old woman with history of COPD presented with acute respiratory distress, briefly required BiPAP in the emergency department, admitted for COPD exacerbation, treated with steroids antibiotics and nebulizers. Initially improved but then developed agitation and delirium thought related to medications. Ativan was discontinued and steroids were tapered. Overall has plateaued although respiratory status appears to be stable. Clinical prognosis is guarded.  Assessment/Plan: 1. Acute on chronic hypoxic respiratory failure. Briefly required BiPAP on admission. No significant change. Hypoxia stable on 3 L. 2. COPD exacerbation. No significant change, seems to have plateaued. Continue oral steroids at current dose. 3. Acute encephalopathy/delirium. Thought related to steroids. Appears somewhat better today. 4. Nausea. Resolved. Abdominal film yesterday was unremarkable. 5. Influenza B. completed Tamiflu. 6. Chronic diastolic congestive heart failure. Appears compensated. Weight is stable. 7. Chronic kidney disease stage III. Improved over baseline. 8. Chronic pain syndrome. Thought secondary to spinal stenosis. On chronic fentanyl. Wheelchair-bound. 9. Hypernatremia. Resolved with IV fluids. 10. Paroxysmal atrial fibrillation. Continue amiodarone. Followup as an outpatient given her lung disease. Continue eliquis for anticoagulation. 11. Dementia?   Clinically without significant change state. Remains hypoxic, stable oxygen requirement. Nausea has resolved and tech is at bedside attempting to feed the patient. Prognosis remains  guarded, oral intake is poor and not taking all medications. Given elevated white blood cell count failure to improve, repeat urinalysis. Chest x-ray suggests some pulmonary edema and atelectasis. Suspect the leukocytosis is related to steroids given lack of fever and stable respiratory status.  Continue oral steroids.  Resume Lasix today.  Will discuss again with family  Code Status: DNR DVT prophylaxis: Eliquis Family Communication:  Disposition Plan: return to ALF when improved  Brendia Sacks, MD  [facility]  Pager [phone] If 7PM-7AM, please contact night-coverage at [email or web address], password [facility] 10/10/2013, 10:19 AM  LOS: 7 days   Consultants:  PT: no needs  Procedures:    Antibiotics:  Levaquin 1/31 >> 2/5  Tamiflu 1/31 >> 2/4  HPI/Subjective: No issues overnight. No new issues but very poor oral intake and did not take oral medications yesterday. Today the patient says she feels better, her nausea has resolved and she has no bowel pain. Her breathing is okay.  Objective: Filed Vitals:   10/09/13 2131 10/10/13 0411 10/10/13 0630 10/10/13 0721  BP: 173/93  172/85   Pulse: 91  92   Temp:   97.9 F (36.6 C)   TempSrc:   Oral   Resp: 20  20   Height:      Weight:      SpO2: 97% 86% 96% 93%   No intake or output data in the 24 hours ending 10/10/13 1019   Filed Weights   10/06/13 0500 10/07/13 0500 10/08/13 0500  Weight: 68.5 kg (151 lb 0.2 oz) 68 kg (149 lb 14.6 oz) 66.7 kg (147 lb 0.8 oz)    Exam:   Afebrile. Hypertensive. Stable hypoxia.  General: Appears chronically ill, acutely ill but more alert today. More interactive and more verbal today. Nontoxic.  Cardiovascular regular rate and rhythm. No murmur, rub or gallop. No lower extremity edema. Feet warm and dry.  Respiratory clear to auscultation bilaterally. No wheezes, rales or rhonchi. Normal respiratory effort.  Abdomen is soft, nontender, nondistended. Positive bowel  sounds.  Skin appears grossly unremarkable without rash or induration.  Neurologic grossly nonfocal.  Data Reviewed:  Capillary blood sugars remained stable. No hypoglycemia.  Hypernatremia has resolved. Creatinine is stable. BUN has improved.  WBC has increased from 14 >> 19.7   Abdominal film, nonobstructive bowel gas pattern.  Chest x-ray today of low lung volumes. Pulmonary edema suspected. Atelectasis.  Scheduled Meds: . amiodarone  200 mg Oral Daily  . antiseptic oral rinse  15 mL Mouth Rinse q12n4p  . apixaban  2.5 mg Oral BID  . benzonatate  100 mg Oral TID  . brimonidine  1 drop Both Eyes BID AC  . chlorhexidine  15 mL Mouth Rinse BID  . docusate sodium  100 mg Oral BID  . dorzolamide  1 drop Both Eyes BID  . DULoxetine  30 mg Oral Daily  . famotidine  20 mg Oral Daily  . feeding supplement (ENSURE COMPLETE)  237 mL Oral BID BM  . gabapentin  300 mg Oral TID  . guaiFENesin  600 mg Oral BID  . insulin aspart  0-15 Units Subcutaneous TID WC  . insulin aspart  0-5 Units Subcutaneous QHS  . insulin glargine  10 Units Subcutaneous QHS  . ipratropium  0.5 mg Nebulization Q4H  . latanoprost  1 drop Both Eyes QHS  . levalbuterol  0.63 mg Nebulization Q4H  . levothyroxine  100 mcg Oral QAC breakfast  . polyethylene glycol  17 g Oral Daily  . potassium chloride SA  40 mEq Oral Daily  . predniSONE  40 mg Oral Q breakfast  . simvastatin  10 mg Oral q1800  . sodium chloride  3 mL Intravenous Q12H  . trimethoprim  100 mg Oral QHS   Continuous Infusions:    Principal Problem:   COPD exacerbation Active Problems:   Chronic pain syndrome   PAF (paroxysmal atrial fibrillation)   HTN (hypertension)   Hypothyroidism   Spinal stenosis of lumbar region   Long term (current) use of anticoagulants   Chronic diastolic heart failure   Pacemaker - dual chamber Medtronic 2006, gen change 2012   Acute respiratory distress   Stage III chronic kidney disease   Anemia    Influenza B   Acute on chronic respiratory failure   Acute delirium   Acute encephalopathy   Time spent 20 minutes

## 2013-10-10 NOTE — Progress Notes (Signed)
Patient is hollering out and can be heard down the halls. I crushed up her vistaril for anxiety and when i asked was she hurting she said yes in her stomach so i crushed her pain pill and put them both on one scoop of applesauce. Patient did try to spit applesauce out but i kept wiping it with the spoon and giving it back and when i couched by saying "goog job" she stopped trying to spit it out and just held it in her mouth until i gave her sips of OJ to swallow it with. Helmut MusterAlicia RN

## 2013-10-11 LAB — CBC
HCT: 43.7 % (ref 36.0–46.0)
HEMOGLOBIN: 13.4 g/dL (ref 12.0–15.0)
MCH: 23.6 pg — ABNORMAL LOW (ref 26.0–34.0)
MCHC: 30.7 g/dL (ref 30.0–36.0)
MCV: 76.9 fL — ABNORMAL LOW (ref 78.0–100.0)
PLATELETS: 140 10*3/uL — AB (ref 150–400)
RBC: 5.68 MIL/uL — ABNORMAL HIGH (ref 3.87–5.11)
RDW: 20.6 % — AB (ref 11.5–15.5)
WBC: 17.3 10*3/uL — ABNORMAL HIGH (ref 4.0–10.5)

## 2013-10-11 LAB — BASIC METABOLIC PANEL
BUN: 29 mg/dL — ABNORMAL HIGH (ref 6–23)
CALCIUM: 11.3 mg/dL — AB (ref 8.4–10.5)
CO2: 27 meq/L (ref 19–32)
Chloride: 116 mEq/L — ABNORMAL HIGH (ref 96–112)
Creatinine, Ser: 0.93 mg/dL (ref 0.50–1.10)
GFR calc non Af Amer: 55 mL/min — ABNORMAL LOW (ref >90)
GFR, EST AFRICAN AMERICAN: 64 mL/min — AB (ref >90)
Glucose, Bld: 85 mg/dL (ref 70–99)
Potassium: 4.1 mEq/L (ref 3.7–5.3)
SODIUM: 154 meq/L — AB (ref 137–147)

## 2013-10-11 LAB — PHOSPHORUS: PHOSPHORUS: 1.8 mg/dL — AB (ref 2.3–4.6)

## 2013-10-11 LAB — GLUCOSE, CAPILLARY
GLUCOSE-CAPILLARY: 79 mg/dL (ref 70–99)
Glucose-Capillary: 88 mg/dL (ref 70–99)

## 2013-10-11 LAB — MAGNESIUM: MAGNESIUM: 2.8 mg/dL — AB (ref 1.5–2.5)

## 2013-10-11 MED ORDER — MORPHINE SULFATE 2 MG/ML IJ SOLN
1.0000 mg | INTRAMUSCULAR | Status: DC | PRN
  Administered 2013-10-11 – 2013-10-12 (×5): 1 mg via INTRAVENOUS
  Filled 2013-10-11 (×5): qty 1

## 2013-10-11 MED ORDER — LORAZEPAM 2 MG/ML IJ SOLN
0.5000 mg | INTRAMUSCULAR | Status: DC | PRN
  Administered 2013-10-12 (×3): 0.5 mg via INTRAVENOUS
  Filled 2013-10-11 (×3): qty 1

## 2013-10-11 NOTE — Progress Notes (Signed)
Pt not on oxygen looks worse, SAT86 placed on 4lpm/[place]

## 2013-10-11 NOTE — Progress Notes (Addendum)
[facility] PROGRESS NOTE  [name] ZOX:[id number] DOB: 01-20-1927 DOA: 10/03/2013 PCP: Cassell Smiles., MD  Summary: 80 year old woman with history of COPD presented with acute respiratory distress, briefly required BiPAP in the emergency department, admitted for COPD exacerbation, treated with steroids antibiotics and nebulizers. Initially improved but then developed agitation and delirium thought related to medications. Despite treatment for influenza, COPD her condition continues to worsen, failure to thrive with almost no oral intake and worsening mentation. After discussion with health care power of attorney and husband, plan for full comfort care and consideration of hospice residential placement.  Assessment/Plan: 1. Acute on chronic hypoxic respiratory failure. Briefly required BiPAP on admission. No significant change. Hypoxia stable on 3 L. 2. COPD exacerbation. No improvement. Has plateaued. Continue oral steroids at current dose if patient will take. 3. Acute encephalopathy/delirium. Thought related to steroids. Appears somewhat better today. 4. Nausea. Unable to ascertain significance of this today. Abdominal exam is benign. Imaging has been unremarkable. There has been no vomiting. 5. Influenza B. completed Tamiflu. 6. Chronic diastolic congestive heart failure. Rsumed Lasix. 7. Chronic kidney disease stage III. Improved over baseline. 8. Chronic pain syndrome. Thought secondary to spinal stenosis. Wheelchair-bound. 9. Hypernatremia. Secondary to dehydration. 10. Paroxysmal atrial fibrillation. Continue amiodarone.  11. Dementia?   Continues to worsen, more lethargic. Discussed with her healthcare power of attorney/son as well as patient's husband at bedside. We reviewed laboratory studies were testing and chest x-ray from yesterday would suggest edema, infiltrate doubted. We discussed continued care in attempts to cure, further investigation including further  imaging, consideration of antibiotics empirically and further laboratory tests. Family feels the patient is declining and would not want further measures. Family requests patient be made full comfort care. I am in agreement. We discussed the unpredictability of dying process. Family unable to take her home. They are interested in residential hospice. Social work consult placed.  Code Status: DNR, full comfort care  Brendia Sacks, MD  [facility]  Pager [phone] If 7PM-7AM, please contact night-coverage at [email or web address], password [facility] 10/11/2013, 11:42 AM  LOS: 8 days   Consultants:  PT: no needs  Procedures:    Antibiotics:  Levaquin 1/31 >> 2/5  Tamiflu 1/31 >> 2/4  HPI/Subjective: Somewhat agitated overnight. Minimal oral intake. Poorly responsive this morning. Does not answer questions. Briefly alerts only. Son and husband at bedside.  Objective: Filed Vitals:   10/10/13 2210 10/10/13 2350 10/11/13 0638 10/11/13 0713  BP: 151/84  180/92   Pulse: 92  104   Temp: 98.2 F (36.8 C)  98.5 F (36.9 C)   TempSrc: Oral  Oral   Resp: 20  20   Height:      Weight:   66.9 kg (147 lb 7.8 oz)   SpO2: 94% 90% 91% 94%    Intake/Output Summary (Last 24 hours) at 10/11/13 1142 Last data filed at 10/10/13 1700  Gross per 24 hour  Intake     20 ml  Output      0 ml  Net     20 ml     Filed Weights   10/07/13 0500 10/08/13 0500 10/11/13 0638  Weight: 68 kg (149 lb 14.6 oz) 66.7 kg (147 lb 0.8 oz) 66.9 kg (147 lb 7.8 oz)    Exam:   Afebrile. Hypertensive. Stable hypoxia on 3 L.  Gen. she appears acutely ill, worsened yesterday. Poorly responsive. Appears fairly calm.  Cardiovascular regular rate and rhythm. No murmur, rub or gallop.  Respiratory clear to  auscultation bilaterally, shallow inspiration. No frank wheezes, rales rhonchi. Moderate increased respiratory effort.  Abdomen positive bowel sounds. Soft, nontender, nondistended.  Skin appears grossly  unremarkable.  Neurologic appears grossly stable. Assessment Limited.  Psychiatric stenosis.  Data Reviewed:  Sodium increased to 154. Creatinine stable.  Phosphorus slightly low, 1.8.  White blood cell count without significant change from 17.3.  Platelet count 140.  Scheduled Meds: . amiodarone  200 mg Oral Daily  . antiseptic oral rinse  15 mL Mouth Rinse q12n4p  . brimonidine  1 drop Both Eyes BID AC  . chlorhexidine  15 mL Mouth Rinse BID  . docusate sodium  100 mg Oral BID  . dorzolamide  1 drop Both Eyes BID  . DULoxetine  30 mg Oral Daily  . famotidine  20 mg Oral Daily  . feeding supplement (ENSURE COMPLETE)  237 mL Oral BID BM  . furosemide  40 mg Oral BID  . gabapentin  300 mg Oral TID  . guaiFENesin  600 mg Oral BID  . ipratropium  0.5 mg Nebulization Q4H  . latanoprost  1 drop Both Eyes QHS  . levalbuterol  0.63 mg Nebulization Q4H  . levothyroxine  100 mcg Oral QAC breakfast  . polyethylene glycol  17 g Oral Daily  . potassium chloride SA  40 mEq Oral Daily  . predniSONE  40 mg Oral Q breakfast  . simvastatin  10 mg Oral q1800  . sodium chloride  3 mL Intravenous Q12H   Continuous Infusions:    Principal Problem:   COPD exacerbation Active Problems:   Chronic pain syndrome   PAF (paroxysmal atrial fibrillation)   HTN (hypertension)   Hypothyroidism   Spinal stenosis of lumbar region   Long term (current) use of anticoagulants   Chronic diastolic heart failure   Pacemaker - dual chamber Medtronic 2006, gen change 2012   Acute respiratory distress   Stage III chronic kidney disease   Anemia   Influenza B   Acute on chronic respiratory failure   Acute delirium   Acute encephalopathy  Time spent 35 minutes. Greater than 50% in counseling and coordination of care.

## 2013-10-12 MED ORDER — IPRATROPIUM BROMIDE 0.02 % IN SOLN: 0.5000 mg | Freq: Four times a day (QID) | RESPIRATORY_TRACT | Status: AC

## 2013-10-12 MED ORDER — LORAZEPAM 0.5 MG PO TABS: 0.5000 mg | ORAL_TABLET | ORAL | Status: DC | PRN

## 2013-10-12 MED ORDER — ALBUTEROL SULFATE (2.5 MG/3ML) 0.083% IN NEBU: 2.5000 mg | INHALATION_SOLUTION | Freq: Four times a day (QID) | RESPIRATORY_TRACT | Status: AC

## 2013-10-12 MED ORDER — MORPHINE SULFATE 10 MG/5ML PO SOLN: 2.5000 mg | ORAL | Status: AC | PRN

## 2013-10-12 MED ORDER — MORPHINE SULFATE (CONCENTRATE) 10 MG /0.5 ML PO SOLN: 2.5000 mg | ORAL | Status: DC | PRN

## 2013-10-12 MED ORDER — ACETAMINOPHEN 650 MG RE SUPP
650.0000 mg | Freq: Four times a day (QID) | RECTAL | Status: AC | PRN
Start: 2013-10-12 — End: ?

## 2013-10-12 MED ORDER — LORAZEPAM 0.5 MG PO TABS: 0.5000 mg | ORAL_TABLET | ORAL | Status: AC | PRN

## 2013-10-12 NOTE — Progress Notes (Signed)
Pt discharged to hospice house today per Dr. Irene LimboGoodrich. Pt's IV site WNL, patent, wrapped with kerlex. Pt's foley kept in place.  Report called to nurse at hospice home. Questions answered accordingly. Pt left floor in stable condition accompanied by EMS transporters.  Granddaughter at bedside at time of discharge.

## 2013-10-12 NOTE — Clinical Social Work Note (Addendum)
Pt declined over weekend and yesterday family made decision for comfort care. CSW met with pt's son who reports he is HCPOA. Discussed options of returning to [facility] with hospice vs [facility]. He chooses [facility], primarily for privacy as pt has a roommate at ALF. Notified [employer] of decision with son's permission. Referred to [facility] and bed is available today. Son accepts and pt will transfer via [facility]. RN to call report. D/C summary faxed. Support provided to son. Out of facility DNR sent with pt.   Benay Pike, [place]

## 2013-10-12 NOTE — Discharge Summary (Addendum)
Physician Discharge Summary  [name] XBJ:[id number] DOB: [birth year] DOA: 10/03/2013  PCP: [name],[name]., [name] discussed with Dr. Sherwood GamblerFusco this AM, apprised of care.  Admit date: 10/03/2013 Discharge date: 10/12/2013  Recommendations for Outpatient Follow-up:  1. Hospice care 2. [place] oxygen for comfort  Discharge Diagnoses:  1. Acute on chronic hypoxic respiratory failure 2. COPD exacerbation 3. Influenza B 4. Acute encephalopathy/delirium 5. Chronic diastolic congestive heart failure 6. Chronic kidney disease stage III 7. Paroxysmal atrial fibrillation 8. Chronic pain syndrome  Discharge Condition: Stable but terminal. Death expected within a week. Disposition: Hospice house  Diet recommendation: as desired  Filed Weights   10/07/13 0500 10/08/13 0500 10/11/13 [id number]  Weight: 68 kg (149 lb 14.6 oz) 66.7 kg (147 lb 0.8 oz) 66.9 kg (147 lb 7.8 oz)    History of present illness:  80 year old woman with history of COPD presented with acute respiratory distress, briefly required BiPAP in the emergency department, admitted for COPD exacerbation.  Hospital Course:  [name] was treated with steroids, antibiotics and nebulizers. Initially improved but then developed agitation and delirium thought related to medications. Despite treatment for influenza and COPD her condition continued to worsen with developing failure to thrive with almost no oral intake and worsening mentation. After discussion with health care power of attorney and husband, patient was made full comfort care and plans made to transfer to residential hospice. It does not appear the patient will recover from COPD exacerbation and influenza B.  1. Acute on chronic hypoxic respiratory failure. Briefly required BiPAP on admission. Worsening despite treatment. 2. COPD exacerbation. Worsening. Patient not taking any oral medications. 3. Acute encephalopathy/delirium. Progressively worsening. 4. Nausea. No vomiting.  Abdominal imaging unremarkable. Significance unclear. Likely related in process. 5. Influenza B. completed Tamiflu. 6. Chronic diastolic congestive heart failure. Not taking any oral medications. 7. Chronic kidney disease stage III. stable. 8. Chronic pain syndrome. Thought secondary to spinal stenosis. Wheelchair-bound. 9. Hypernatremia. Secondary to dehydration. 10. Paroxysmal atrial fibrillation.  11. Dementia? Discussed with son at bedside again today. Reviewed current treatment planof full comfort care, morphine and Ativan as needed. Patient not taking any oral medications. We will narrow to comfort medications only. He is at peace with his decision-making. He would like to transfer to hospice today. He understands that death likely next few days.  If were to improve, would consider restarting chronic oral medications.  Transfer to hospice home today.  Consultants:  PT: no needs Procedures:  Antibiotics:  Levaquin 1/31 >> 2/5  Tamiflu 1/31 >> 2/4  Discharge Instructions     Medication List    STOP taking these medications       acetaminophen 325 MG tablet  Commonly known as:  TYLENOL  Replaced by:  acetaminophen 650 MG suppository     amiodarone 200 MG tablet  Commonly known as:  PACERONE     amoxicillin 500 MG capsule  Commonly known as:  AMOXIL     CALCIUM 600 + D PO     docusate sodium 100 MG capsule  Commonly known as:  COLACE     ELIQUIS 2.5 MG Tabs tablet  Generic drug:  apixaban     fentaNYL 12 MCG/HR  Commonly known as:  DURAGESIC - dosed mcg/hr     furosemide 80 MG tablet  Commonly known as:  LASIX     gabapentin 300 MG capsule  Commonly known as:  NEURONTIN     HYDROcodone-acetaminophen 5-325 MG per tablet  Commonly known as:  NORCO/VICODIN     levofloxacin 500 MG tablet  Commonly known as:  LEVAQUIN     levothyroxine 100 MCG tablet  Commonly known as:  SYNTHROID, LEVOTHROID     magic mouthwash Soln     metoprolol 50 MG tablet  Commonly  known as:  LOPRESSOR     ondansetron 4 MG tablet  Commonly known as:  ZOFRAN     polyethylene glycol powder powder  Commonly known as:  GLYCOLAX/MIRALAX     potassium chloride SA 20 MEQ tablet  Commonly known as:  K-DUR,KLOR-CON     sertraline 25 MG tablet  Commonly known as:  ZOLOFT     tiotropium 18 MCG inhalation capsule  Commonly known as:  SPIRIVA     trimethoprim 100 MG tablet  Commonly known as:  TRIMPEX     TUSSIN EXPECTORANT PO      TAKE these medications       acetaminophen 650 MG suppository  Commonly known as:  TYLENOL  Place 1 suppository (650 mg total) rectally every 6 (six) hours as needed for mild pain (or Fever >/= 101).     albuterol (2.5 MG/3ML) 0.083% nebulizer solution  Commonly known as:  PROVENTIL  Take 3 mLs (2.5 mg total) by nebulization every 6 (six) hours.     brimonidine 0.1 % Soln  Commonly known as:  ALPHAGAN P  Place 1 drop into both eyes 2 (two) times daily.     dorzolamide 2 % ophthalmic solution  Commonly known as:  TRUSOPT  Place 1 drop into both eyes 2 (two) times daily.     ipratropium 0.02 % nebulizer solution  Commonly known as:  ATROVENT  Take 2.5 mLs (0.5 mg total) by nebulization every 6 (six) hours.     LORazepam 0.5 MG tablet  Commonly known as:  ATIVAN  Take 1 tablet (0.5 mg total) by mouth every 4 (four) hours as needed for anxiety. anxiety     LUMIGAN 0.01 % Soln  Generic drug:  bimatoprost  Place 1 drop into both eyes at bedtime.     morphine 10 MG/5ML solution  Place 1.3 mLs (2.6 mg total) under the tongue every hour as needed for severe pain.       Allergies  Allergen Reactions  . Naproxen     Patient on coumadin  . Sulfa Antibiotics   . Robaxin [Methocarbamol]     Causes patient to be confused    The results of significant diagnostics from this hospitalization (including imaging, microbiology, ancillary and laboratory) are listed below for reference.    Significant Diagnostic Studies: Dg Chest  Port 1 View  10/10/2013   CLINICAL DATA:  Shortness of breath  EXAM: PORTABLE CHEST - 1 VIEW  COMPARISON:  DG CHEST 1V PORT dated 10/05/2013  FINDINGS: Low lung volumes. The cardiac silhouette is enlarged. There is diffuse prominence of the interstitial markings, peribronchial cuffing, and indistinctness of the pulmonary vasculature. A confluent areas increased density projects in the central aspect of right hemithorax. No focal reason consolidation identified. The osseous structures demonstrate no evidence of acute abnormalities. There are findings which may reflect prior remote fractures within the right hemithorax. A left chest wall pacing unit, cardiac, is appreciated. The tips project in the region of the right atrium and right ventricle.  IMPRESSION: Low lung volumes. Interstitial infiltrate likely reflecting pulmonary edema. An underlying component of pulmonary fibrosis is also a diagnostic consideration. Asymmetric edema versus atelectasis versus focal infiltrate in the central aspect of the  right hemi thorax is appreciated.   Electronically Signed   By: Salome Holmes M.D.   On: 10/10/2013 09:55   Dg Abd Portable 1v  10/09/2013   CLINICAL DATA:  Nausea, question ileus, listless, history hypertension, coronary artery disease  EXAM: PORTABLE ABDOMEN - 1 VIEW  COMPARISON:  10/06/2013  FINDINGS: Rotated to the left.  Nonobstructive bowel gas pattern.  No bowel dilatation or bowel wall thickening.  Gas extending below the right pubic bones question related to the rectum on a rotated exam.  Extensive atherosclerotic calcifications.  Prior spinal augmentation procedure at L1 and right hip replacement.  Diffuse osseous demineralization.  IMPRESSION: Nonobstructive bowel gas pattern.   Electronically Signed   By: Ulyses Southward M.D.   On: 10/09/2013 13:27   Dg Abd Portable 1v  10/06/2013   CLINICAL DATA:  Nausea.  Constipation.  EXAM: PORTABLE ABDOMEN - 1 VIEW  COMPARISON:  CT, 10/06/2012  FINDINGS: Bowel gas pattern  is unremarkable.  No significant increased stool.  Dense calcifications noted along the abdominal aorta and iliac arteries. No organomegaly. Soft tissues are otherwise unremarkable.  L1 compression fracture has been treated with vertebroplasty. Well-seated and aligned right hip prosthesis. Bony structures are diffusely demineralized. There is an untreated compression fracture of T12.  IMPRESSION: No acute findings.  No obstruction.  No increased stool.   Electronically Signed   By: Amie Portland M.D.   On: 10/06/2013 15:02    Microbiology: Recent Results (from the past 240 hour(s))  URINE CULTURE     Status: None   Collection Time    10/03/13  6:37 AM      Result Value Range Status   Specimen Description URINE, CATHETERIZED   Final   Special Requests NONE   Final   Culture  Setup Time     Final   Value: 10/03/2013 21:30     Performed at [facility] Count     Final   Value: NO GROWTH     Performed at [facility]   Culture     Final   Value: NO GROWTH     Performed at [facility]   Report Status 10/04/2013 FINAL   Final  MRSA PCR SCREENING     Status: None   Collection Time    10/03/13  7:58 AM      Result Value Range Status   MRSA by PCR NEGATIVE  NEGATIVE Final   Comment:            The GeneXpert MRSA Assay (FDA     approved for NASAL specimens     only), is one component of a     comprehensive MRSA colonization     surveillance program. It is not     intended to diagnose MRSA     infection nor to guide or     monitor treatment for     MRSA infections.     Labs: Basic Metabolic Panel:  Recent Labs Lab 10/07/13 0520 10/08/13 0451 10/09/13 0551 10/10/13 0614 10/11/13 0859  NA 148* 148* 153* 147 154*  K 3.5* 4.8 4.4 4.1 4.1  CL 107 109 113* 108 116*  CO2 32 29 29 27 27   GLUCOSE 204* 185* 156* 124* 85  BUN 26* 27* 33* 30* 29*  CREATININE 1.06 1.04 1.08 0.98 0.93  CALCIUM 9.8 10.8* 11.3* 11.4* 11.3*  MG  --   --   --   --  2.8*  PHOS  --   --   --   --  1.8*   CBC:  Recent Labs Lab 10/06/13 0452 10/07/13 0520 10/09/13 0551 10/10/13 0614 10/11/13 0859  WBC 9.8 8.9 14.0* 19.7* 17.3*  HGB 11.3* 12.1 12.9 14.4 13.4  HCT 36.7 39.6 42.5 46.7* 43.7  MCV 76.1* 76.7* 76.9* 76.7* 76.9*  PLT 166 162 210 209 140*     Recent Labs  08/03/13 1042 09/01/13 1455 10/03/13 0530  PROBNP 1912.0* 1793.0* 1700.0*   CBG:  Recent Labs Lab 10/10/13 1151 10/10/13 1645 10/10/13 2031 10/11/13 0748 10/11/13 1138  GLUCAP 118* 109* 123* 79 88    Principal Problem:   COPD exacerbation Active Problems:   Chronic pain syndrome   PAF (paroxysmal atrial fibrillation)   HTN (hypertension)   Hypothyroidism   Spinal stenosis of lumbar region   Long term (current) use of anticoagulants   Chronic diastolic heart failure   Pacemaker - dual chamber Medtronic 2006, gen change 2012   Acute respiratory distress   Stage III chronic kidney disease   Anemia   Influenza B   Acute on chronic respiratory failure   Acute delirium   Acute encephalopathy   Time coordinating discharge: 35 minutes  Signed:  Brendia Sacks, [name] [facility] 10/12/2013, 9:42 AM

## 2013-10-12 NOTE — Progress Notes (Signed)
Nutrition Brief Note  Chart reviewed. Pt now transitioning to comfort care.  No further nutrition interventions warranted at this time.  Please re-consult as needed.   Edmund Rick A. Shamiah Kahler, RD, LDN Pager: [phone]  

## 2013-10-12 NOTE — Progress Notes (Signed)
PROGRESS NOTE  [name]:[id number] DOB: 26-Feb-1927 DOA: 10/03/2013 PCP: Cassell Smiles., MD  Summary: 79 year old woman with history of COPD presented with acute respiratory distress, briefly required BiPAP in the emergency department, admitted for COPD exacerbation, treated with steroids antibiotics and nebulizers. Initially improved but then developed agitation and delirium thought related to medications. Despite treatment for influenza, COPD her condition continues to worsen, failure to thrive with almost no oral intake and worsening mentation. After discussion with health care power of attorney and husband, patient was made full comfort care and consideration of hospice residential placement.  Assessment/Plan: 1. Acute on chronic hypoxic respiratory failure. Briefly required BiPAP on admission. Worsening as expected. 2. COPD exacerbation. No improvement. Has plateaued. Patient not taking any oral medications. 3. Acute encephalopathy/delirium. Worse today. 4. Nausea. No vomiting. Abdominal imaging unremarkable. Significance unclear. Likely related in process. 5. Influenza B. completed Tamiflu. 6. Chronic diastolic congestive heart failure. Not taking any oral medications. 7. Chronic kidney disease stage III. stable. 8. Chronic pain syndrome. Thought secondary to spinal stenosis. Wheelchair-bound. 9. Hypernatremia. Secondary to dehydration. 10. Paroxysmal atrial fibrillation.  11. Dementia?   Discussed with son at bedside again today. Reviewed current treatment plan a full comfort care, morphine and Ativan as needed. Patient not taking any oral medications. We will narrow to comfort medications only. He is at peace with his decision-making. He would like to transfer to hospice today. He understands that death likely next few days.  If were to improve, would consider restarting chronic oral medications.  Transfer to hospice home today.  Code Status: DNR, full comfort  care  Brendia Sacks, MD  [facility]  Pager [phone] If 7PM-7AM, please contact night-coverage at [email or web address], password [facility] 10/12/2013, 9:28 AM  LOS: 9 days   Consultants:  PT: no needs  Procedures:    Antibiotics:  Levaquin 1/31 >> 2/5  Tamiflu 1/31 >> 2/4  HPI/Subjective: Somewhat restless overnight but seems to beresponding well to morphine. Son at bedside.  Objective: Filed Vitals:   10/11/13 2021 10/11/13 2202 10/12/13 0615 10/12/13 0753  BP:  158/89 134/80   Pulse:  99 98   Temp:  97.7 F (36.5 C) 98.1 F (36.7 C)   TempSrc:  Axillary Axillary   Resp:  20 20   Height:      Weight:      SpO2: 86% 91% 91% 89%    Intake/Output Summary (Last 24 hours) at 10/12/13 0928 Last data filed at 10/11/13 1736  Gross per 24 hour  Intake      0 ml  Output      0 ml  Net      0 ml     Filed Weights   10/07/13 0500 10/08/13 0500 10/11/13 0638  Weight: 68 kg (149 lb 14.6 oz) 66.7 kg (147 lb 0.8 oz) 66.9 kg (147 lb 7.8 oz)    Exam:   Afebrile. Hypertensive. Worsening hypoxia, now on 5L  General: Intermittently restless but overall fairly comfortable. Respiratory effort was increased, she is not meaningfully responsive. She appears to be dying.  Cardiovascular regular rate and rhythm. No murmur, rub or gallop. No lower extremity edema. Feet are warm and dry.  Respiratory fair air movement. No frank rhonchi or wheezes. Few rales. Moderate increased respiratory effort.  Psychiatric cannot assess  Abdomen soft nontender nondistended.  Skin appears grossly unremarkable.  Data Reviewed:  No new data  Scheduled Meds: . amiodarone  200 mg Oral Daily  . antiseptic oral rinse  15  mL Mouth Rinse q12n4p  . brimonidine  1 drop Both Eyes BID AC  . chlorhexidine  15 mL Mouth Rinse BID  . docusate sodium  100 mg Oral BID  . dorzolamide  1 drop Both Eyes BID  . DULoxetine  30 mg Oral Daily  . famotidine  20 mg Oral Daily  . feeding supplement (ENSURE  COMPLETE)  237 mL Oral BID BM  . furosemide  40 mg Oral BID  . gabapentin  300 mg Oral TID  . guaiFENesin  600 mg Oral BID  . ipratropium  0.5 mg Nebulization Q4H  . latanoprost  1 drop Both Eyes QHS  . levalbuterol  0.63 mg Nebulization Q4H  . levothyroxine  100 mcg Oral QAC breakfast  . polyethylene glycol  17 g Oral Daily  . potassium chloride SA  40 mEq Oral Daily  . predniSONE  40 mg Oral Q breakfast  . simvastatin  10 mg Oral q1800  . sodium chloride  3 mL Intravenous Q12H   Continuous Infusions:    Principal Problem:   COPD exacerbation Active Problems:   Chronic pain syndrome   PAF (paroxysmal atrial fibrillation)   HTN (hypertension)   Hypothyroidism   Spinal stenosis of lumbar region   Long term (current) use of anticoagulants   Chronic diastolic heart failure   Pacemaker - dual chamber Medtronic 2006, gen change 2012   Acute respiratory distress   Stage III chronic kidney disease   Anemia   Influenza B   Acute on chronic respiratory failure   Acute delirium   Acute encephalopathy

## 2013-11-01 DEATH — deceased

## 2013-11-30 ENCOUNTER — Telehealth: Payer: Self-pay

## 2013-11-30 NOTE — Telephone Encounter (Signed)
Patient past away per [facility] in [employer] °

## 2014-08-11 ENCOUNTER — Encounter (HOSPITAL_COMMUNITY): Payer: Self-pay | Admitting: Cardiovascular Disease

## 2015-02-05 IMAGING — CR DG ABD PORTABLE 1V
1 series · 1 of 1 positions shown · non-contrast
Comparison: CT, 10/06/2012

CLINICAL DATA: Nausea.  Constipation.

EXAM:
PORTABLE ABDOMEN - 1 VIEW

[supine ap]
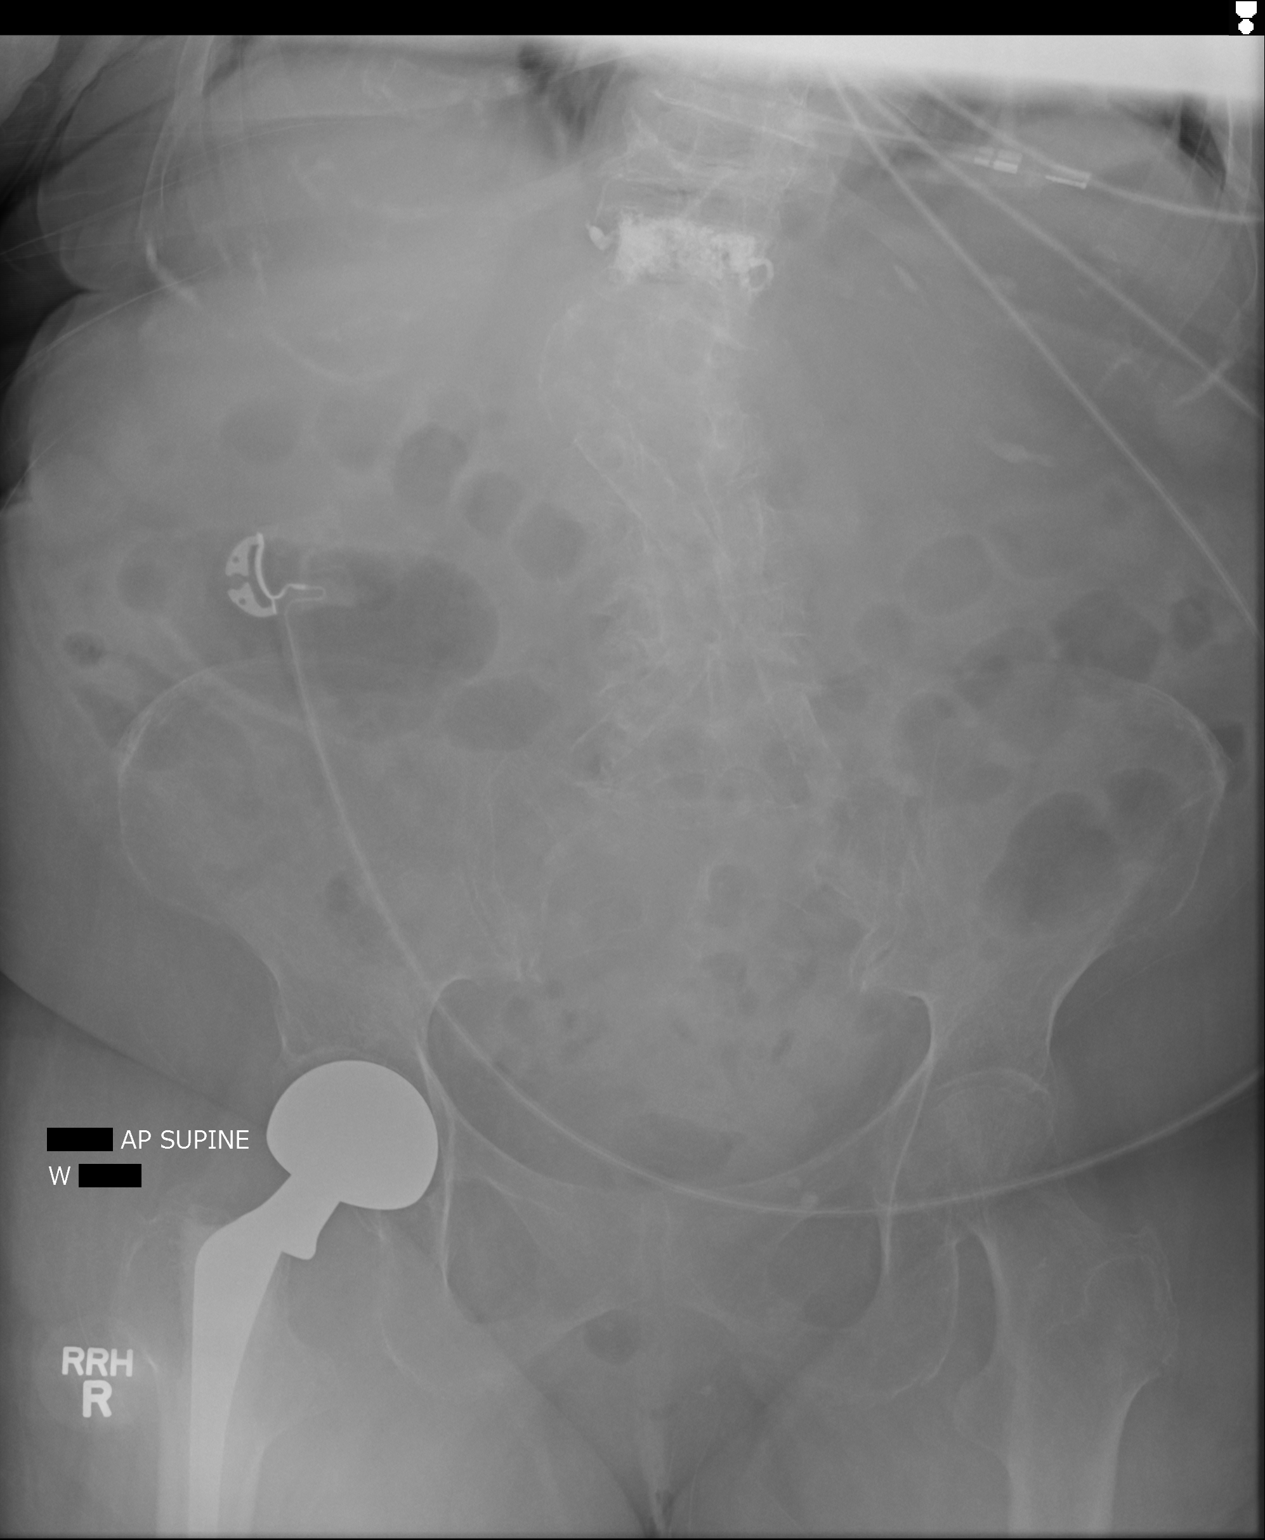

[1 of 1 positions shown; findings below may reference images not displayed]

FINDINGS: Bowel gas pattern is unremarkable.  No significant increased stool.

Dense calcifications noted along the abdominal aorta and iliac
arteries. No organomegaly. Soft tissues are otherwise unremarkable.

L1 compression fracture has been treated with vertebroplasty.
Well-seated and aligned right hip prosthesis. Bony structures are
diffusely demineralized. There is an untreated compression fracture
of T12.
IMPRESSION: No acute findings.  No obstruction.  No increased stool.
